# Patient Record
Sex: Female | Born: 1985 | Hispanic: No | Marital: Married | Smoking: Never smoker
Health system: Southern US, Community
[De-identification: ages and names within clinical notes are randomized; demographics above are authoritative.]

## PROBLEM LIST (undated history)

## (undated) DIAGNOSIS — Z923 Personal history of irradiation: Secondary | ICD-10-CM

## (undated) DIAGNOSIS — Z22330 Carrier of Group B streptococcus: Secondary | ICD-10-CM

## (undated) DIAGNOSIS — Z820 Family history of epilepsy and other diseases of the nervous system: Secondary | ICD-10-CM

## (undated) DIAGNOSIS — Z8619 Personal history of other infectious and parasitic diseases: Secondary | ICD-10-CM

## (undated) DIAGNOSIS — F32A Depression, unspecified: Secondary | ICD-10-CM

## (undated) DIAGNOSIS — R87619 Unspecified abnormal cytological findings in specimens from cervix uteri: Secondary | ICD-10-CM

## (undated) DIAGNOSIS — F419 Anxiety disorder, unspecified: Secondary | ICD-10-CM

## (undated) DIAGNOSIS — K219 Gastro-esophageal reflux disease without esophagitis: Secondary | ICD-10-CM

## (undated) DIAGNOSIS — C50919 Malignant neoplasm of unspecified site of unspecified female breast: Secondary | ICD-10-CM

## (undated) DIAGNOSIS — O210 Mild hyperemesis gravidarum: Secondary | ICD-10-CM

## (undated) DIAGNOSIS — Z9221 Personal history of antineoplastic chemotherapy: Secondary | ICD-10-CM

## (undated) DIAGNOSIS — Z87898 Personal history of other specified conditions: Secondary | ICD-10-CM

## (undated) DIAGNOSIS — IMO0002 Reserved for concepts with insufficient information to code with codable children: Secondary | ICD-10-CM

## (undated) DIAGNOSIS — D649 Anemia, unspecified: Secondary | ICD-10-CM

## (undated) DIAGNOSIS — R1013 Epigastric pain: Secondary | ICD-10-CM

## (undated) DIAGNOSIS — Z8742 Personal history of other diseases of the female genital tract: Secondary | ICD-10-CM

## (undated) DIAGNOSIS — R102 Pelvic and perineal pain: Secondary | ICD-10-CM

## (undated) DIAGNOSIS — R630 Anorexia: Secondary | ICD-10-CM

## (undated) HISTORY — DX: Personal history of other specified conditions: Z87.898

## (undated) HISTORY — DX: Gastro-esophageal reflux disease without esophagitis: K21.9

## (undated) HISTORY — PX: MASTECTOMY: SHX3

## (undated) HISTORY — DX: Family history of epilepsy and other diseases of the nervous system: Z82.0

## (undated) HISTORY — DX: Mild hyperemesis gravidarum: O21.0

## (undated) HISTORY — DX: Reserved for concepts with insufficient information to code with codable children: IMO0002

## (undated) HISTORY — DX: Depression, unspecified: F32.A

## (undated) HISTORY — DX: Anxiety disorder, unspecified: F41.9

## (undated) HISTORY — DX: Unspecified abnormal cytological findings in specimens from cervix uteri: R87.619

## (undated) HISTORY — DX: Carrier of group B Streptococcus: Z22.330

## (undated) HISTORY — DX: Pelvic and perineal pain: R10.2

## (undated) HISTORY — DX: Anemia, unspecified: D64.9

## (undated) HISTORY — PX: WISDOM TOOTH EXTRACTION: SHX21

## (undated) HISTORY — DX: Malignant neoplasm of unspecified site of unspecified female breast: C50.919

## (undated) HISTORY — DX: Epigastric pain: R10.13

## (undated) HISTORY — DX: Personal history of other infectious and parasitic diseases: Z86.19

## (undated) HISTORY — DX: Personal history of other diseases of the female genital tract: Z87.42

## (undated) HISTORY — DX: Anorexia: R63.0

---

## 2004-02-17 ENCOUNTER — Other Ambulatory Visit: Admission: RE | Admit: 2004-02-17 | Discharge: 2004-02-17 | Payer: Self-pay | Admitting: Obstetrics and Gynecology

## 2004-04-27 ENCOUNTER — Encounter: Admission: RE | Admit: 2004-04-27 | Discharge: 2004-04-27 | Payer: Self-pay | Admitting: Gastroenterology

## 2004-04-27 IMAGING — US US ABDOMEN COMPLETE
1 series · 14 of 25 positions shown · non-contrast
Comparison: none

CLINICAL DATA: Upper abdominal pain.  13 weeks pregnant.
 ULTRASOUND OF THE ABDOMEN
 Scans over the upper abdomen were performed.  The gallbladder is well seen and no gallstones are noted.  The liver has a normal echogenic pattern.  The common bile duct is normal measuring 3.5 mm in diameter.  Assessment of the IVC and pancreas is somewhat limited by bowel gas although most of the pancreas is moderately well seen and appears normal.  The spleen is normal in size.  No hydronephrosis is seen.  The right kidney measures 10.6 cm sagittally with the left kidney measuring 10.1 cm.  The abdominal aorta is normal in caliber.

[Series 1: unknown · 0.27mm/px · 14 of 69 slices shown]
[im 1/69]
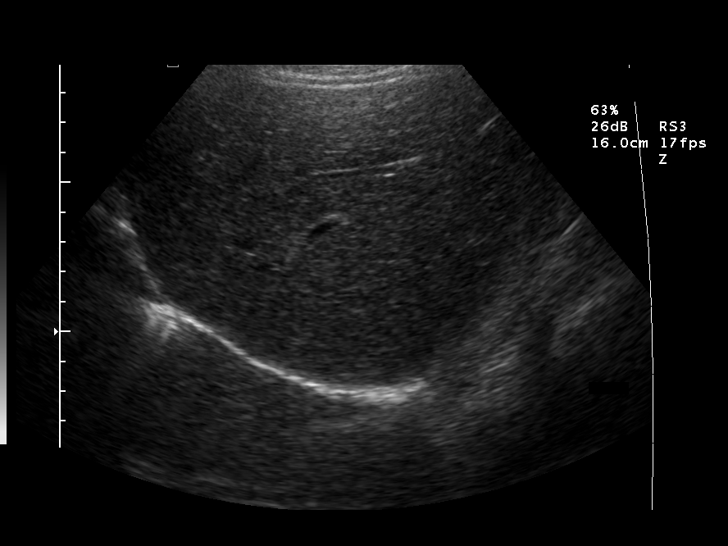
[im 6/69]
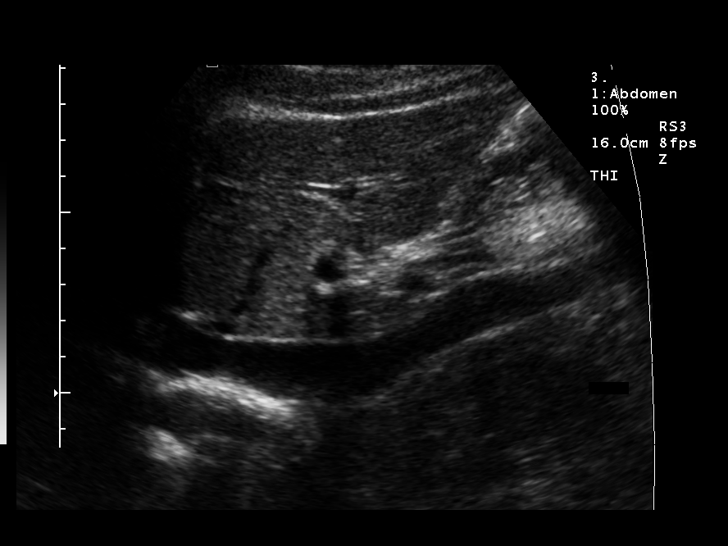
[im 12/69]
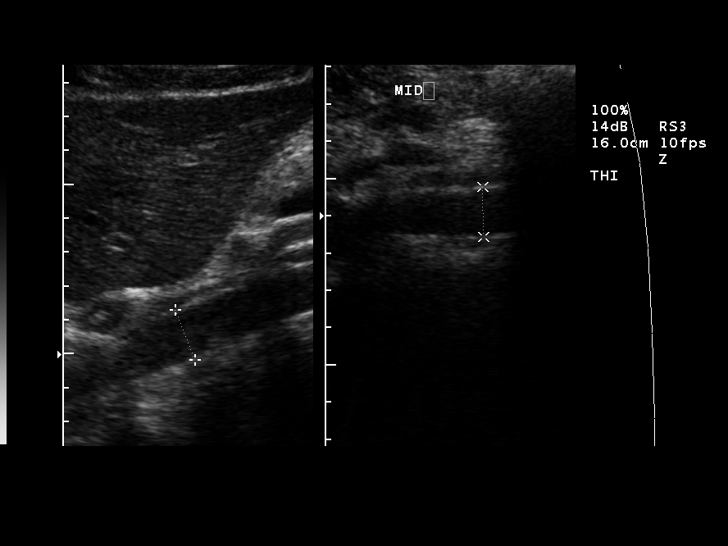
[im 18/69]
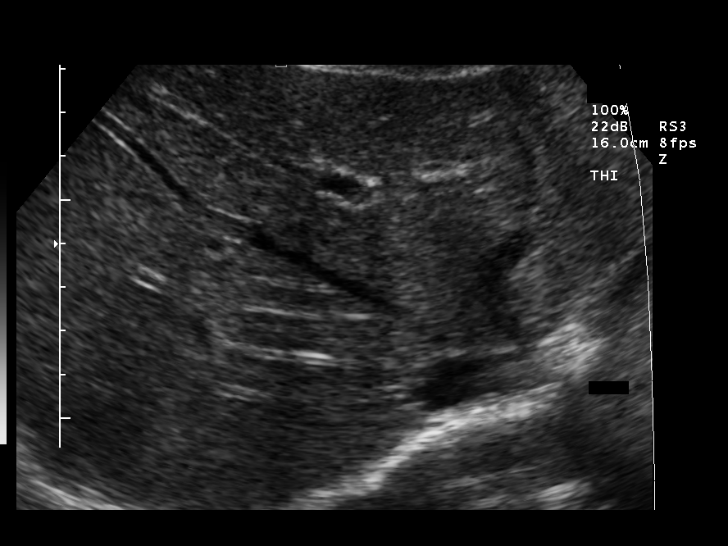
[im 23/69]
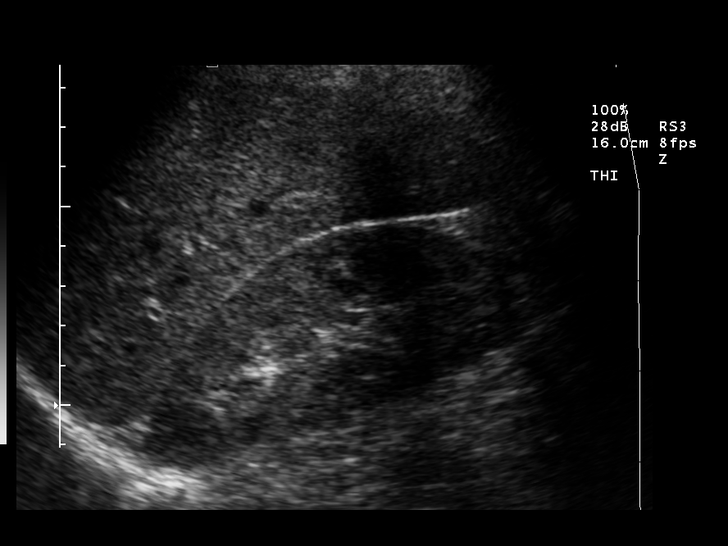
[im 26/69]
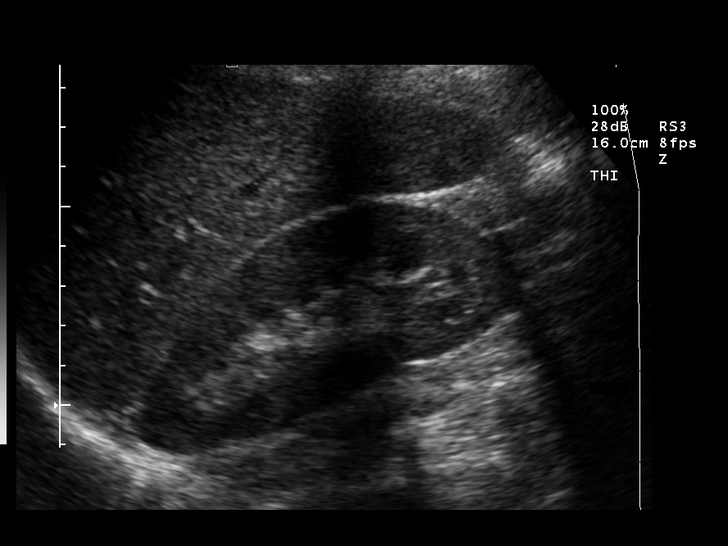
[im 32/69]
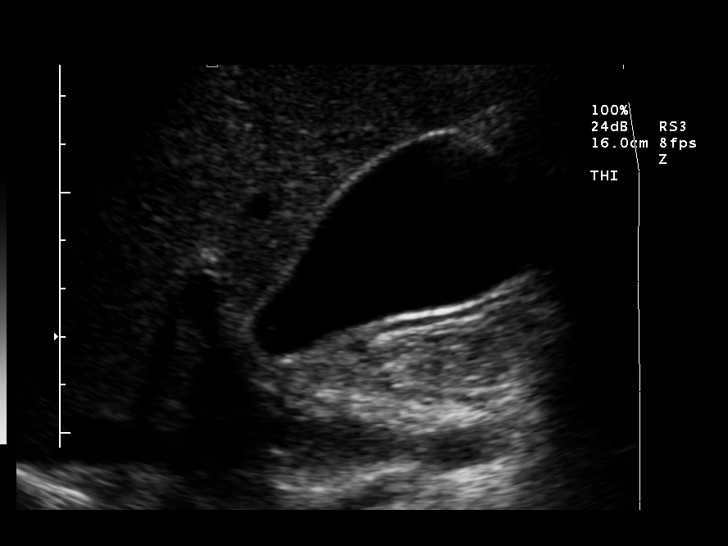
[im 37/69]
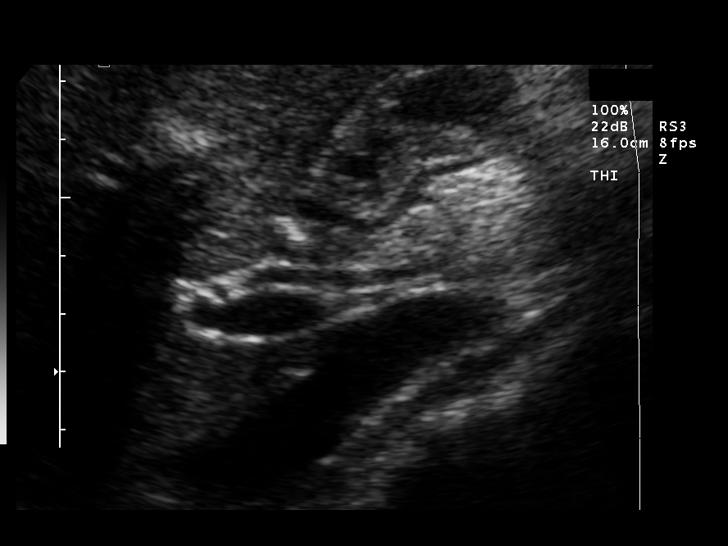
[im 43/69]
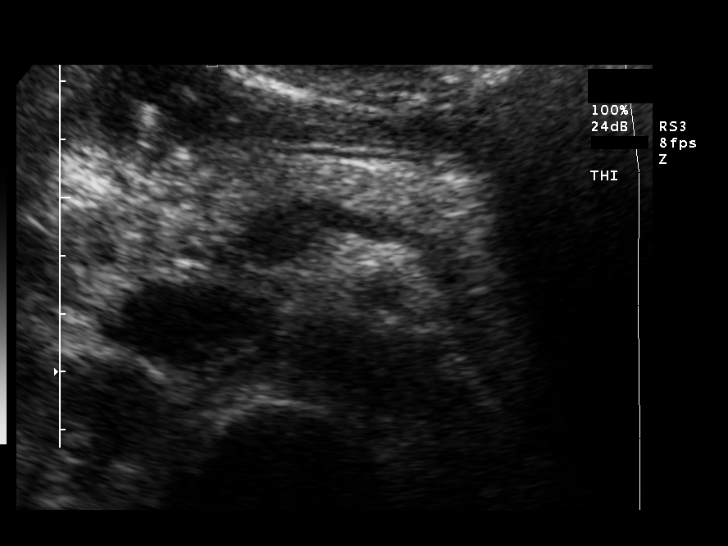
[im 46/69]
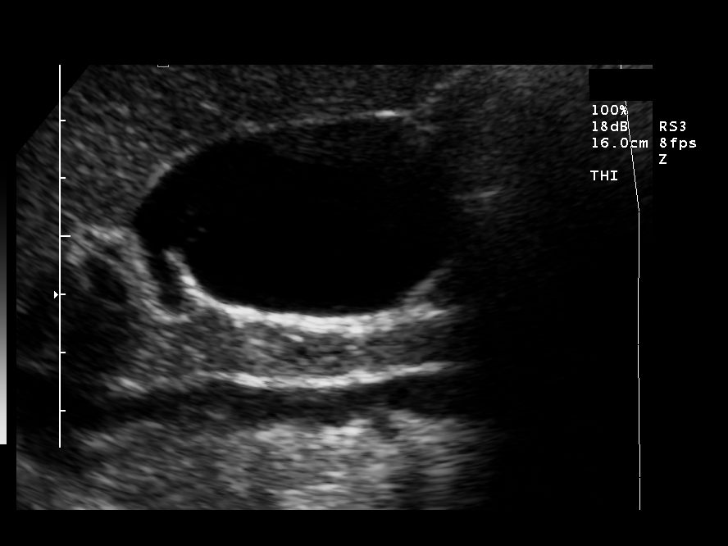
[im 52/69]
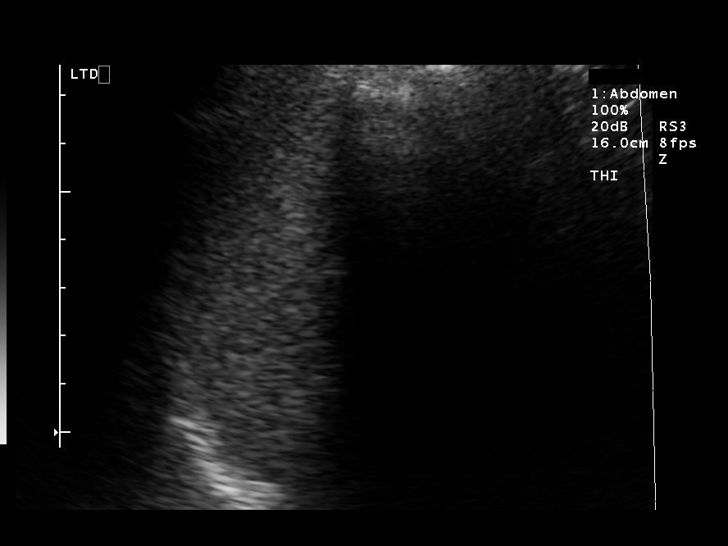
[im 57/69]
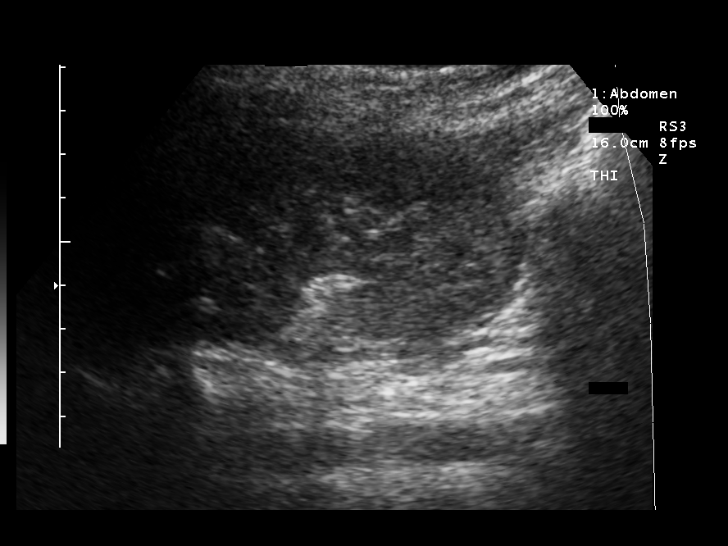
[im 63/69]
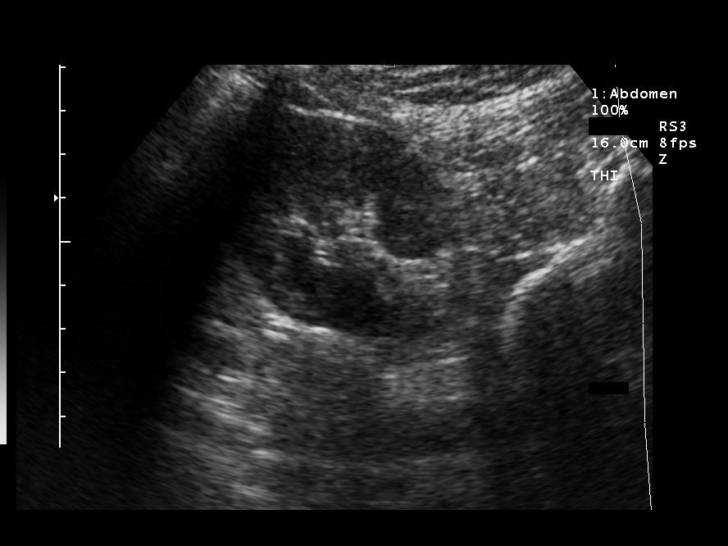
[im 69/69]
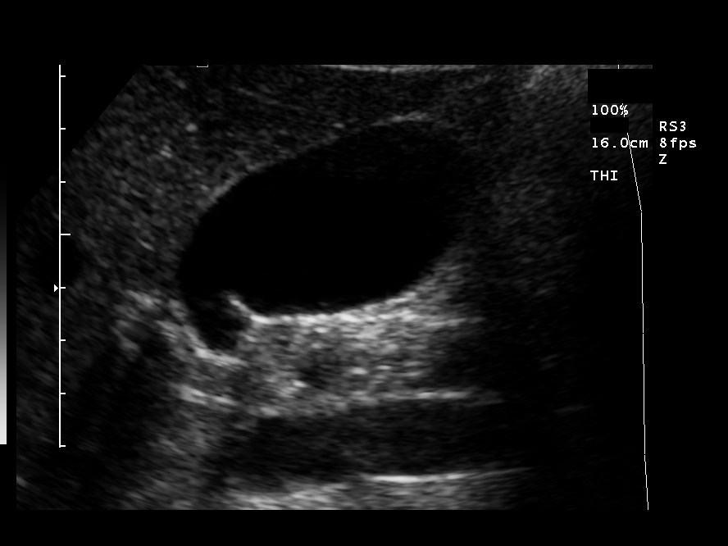

[14 of 25 positions shown; findings below may reference images not displayed]

IMPRESSION: 1.  No gallstones. 
 2.  Pancreas appears normal with only the tail not well seen due to bowel gas.

## 2004-08-26 ENCOUNTER — Other Ambulatory Visit: Admission: RE | Admit: 2004-08-26 | Discharge: 2004-08-26 | Payer: Self-pay | Admitting: Obstetrics and Gynecology

## 2004-10-22 ENCOUNTER — Inpatient Hospital Stay (HOSPITAL_COMMUNITY): Admission: AD | Admit: 2004-10-22 | Discharge: 2004-10-25 | Payer: Self-pay | Admitting: Obstetrics and Gynecology

## 2004-10-28 ENCOUNTER — Inpatient Hospital Stay (HOSPITAL_COMMUNITY): Admission: AD | Admit: 2004-10-28 | Discharge: 2004-10-28 | Payer: Self-pay

## 2005-07-07 ENCOUNTER — Other Ambulatory Visit: Admission: RE | Admit: 2005-07-07 | Discharge: 2005-07-07 | Payer: Self-pay | Admitting: Obstetrics and Gynecology

## 2005-07-16 DIAGNOSIS — IMO0002 Reserved for concepts with insufficient information to code with codable children: Secondary | ICD-10-CM

## 2005-07-16 HISTORY — DX: Reserved for concepts with insufficient information to code with codable children: IMO0002

## 2006-06-17 DIAGNOSIS — Z8742 Personal history of other diseases of the female genital tract: Secondary | ICD-10-CM

## 2006-06-17 HISTORY — DX: Personal history of other diseases of the female genital tract: Z87.42

## 2006-10-17 DIAGNOSIS — Z87898 Personal history of other specified conditions: Secondary | ICD-10-CM

## 2006-10-17 DIAGNOSIS — R630 Anorexia: Secondary | ICD-10-CM

## 2006-10-17 DIAGNOSIS — R1013 Epigastric pain: Secondary | ICD-10-CM

## 2006-10-17 HISTORY — DX: Personal history of other specified conditions: Z87.898

## 2006-10-17 HISTORY — DX: Anorexia: R63.0

## 2006-10-17 HISTORY — DX: Epigastric pain: R10.13

## 2006-10-26 ENCOUNTER — Inpatient Hospital Stay (HOSPITAL_COMMUNITY): Admission: AD | Admit: 2006-10-26 | Discharge: 2006-10-26 | Payer: Self-pay | Admitting: Obstetrics and Gynecology

## 2007-06-12 ENCOUNTER — Inpatient Hospital Stay (HOSPITAL_COMMUNITY): Admission: AD | Admit: 2007-06-12 | Discharge: 2007-06-14 | Payer: Self-pay | Admitting: Obstetrics and Gynecology

## 2008-09-16 DIAGNOSIS — R102 Pelvic and perineal pain: Secondary | ICD-10-CM

## 2008-09-16 HISTORY — DX: Pelvic and perineal pain: R10.2

## 2010-05-31 NOTE — H&P (Signed)
Caitlyn Miller, Caitlyn Miller               ACCOUNT NO.:  [id number]   MEDICAL RECORD NO.:  [id number]          PATIENT TYPE:  INP   LOCATION:  9170                          FACILITY:  [facility]   PHYSICIAN:  Crist Fat. Rivard, M.D. DATE OF BIRTH:  03/07/1985   DATE OF ADMISSION:  06/12/2007  DATE OF DISCHARGE:                              HISTORY & PHYSICAL   HISTORY OF PRESENT ILLNESS:  This 26 year old gravida 2, para 1-0-0-1 at  40-2/7 weeks who presents for induction of labor secondary to  oligohydramnios and BPP score of 4/8.  There is no bleeding, and there  are no contractions.  Pregnancy has been followed by Dr. Lamar Benes and  remarkable for;  1. Language barrier.  2. GERD  3. History of anemia.  4. History of abnormal Pap.  5. Group B strep negative.   ALLERGIES:  None.   OB HISTORY:  Remarkable for vaginal delivery in 2006 of a female infant at  2 weeks' gestation, weighing 7-1/2 pounds with no complications.   MEDICAL HISTORY:  Remarkable for anemia with first pregnancy,  hyperemesis with first pregnancy, childhood varicella, and questionable  history of hypertension.   SURGICAL HISTORY:  Negative.   FAMILY HISTORY:  Remarkable for mother with migraines.   GENETIC HISTORY:  Remarkable for consanguinity on the patient's father  side and father of the baby's sister with twins.   SOCIAL HISTORY:  The patient is married to [employer] who is involved  and supportive.  She is of Muslim faith.  She has a slight language  barrier, but appears to understand most communication.  She denies any  alcohol, tobacco, or drug use.   PRENATAL LABS:  Hemoglobin 12.1, platelets 276.  Blood type A+, antibody  screen negative,  RPR nonreactive, rubella immune.  Hepatitis negative,  HIV negative.  Pap test normal.  Gonorrhea negative, chlamydia negative.  History of current pregnancy.  The patient entered care at 12 weeks'  gestation.  She was given Phenergan and Zofran for nausea and vomiting.  She had a 6-week ultrasound that was normal.  She had first trimester  screen that was normal and a 20-week ultrasound that was also normal.  Glucola at 28 weeks was normal at 93, and Group B strep was negative at  term.   OBJECTIVE:  VITAL SIGNS:  Stable, afebrile.  HEENT:  Within normal limits.  Thyroid not enlarged.  CHEST:  Clear to auscultation.  HEART:  Regular rate and rhythm.  ABDOMEN:  Gravid.  Vertex Leopold's exam shows reactive fetal heart rate  with no contractions.  PELVIC:  EFW is 7 to 7-1/2 pounds.  Cervix was fingertip and 50% in the  office today.  EXTREMITIES:  Within normal limits.   ASSESSMENT:  1. Intrauterine pregnancy at 40-2/7 weeks.  2. Oligohydramnios with BPP score 4/8.   PLAN:  1. Admit per Dr. Estanislado Pandy.  2. Routine MD orders.  3. Cervidil tonight and Pitocin in a.m., and further orders to follow.      Marie L. Williams, C.N.M.      Crist Fat Rivard, M.D.  Electronically Signed  MLW/MEDQ  D:  06/12/2007  T:  06/13/2007  Job:  [id number]

## 2010-06-03 NOTE — H&P (Signed)
NAMEDEANN, MCLAINE NO.:  [id number]   MEDICAL RECORD NO.:  [id number]          PATIENT TYPE:  MAT   LOCATION:  MATC                          FACILITY:  [facility]   PHYSICIAN:  Hal Morales, M.D.DATE OF BIRTH:  September 06, 1985   DATE OF ADMISSION:  10/22/2004  DATE OF DISCHARGE:                                HISTORY & PHYSICAL   Ms. Brotzman is a 25 year old married female, primigravida at 40-4/7th weeks,  who presents with some uterine contractions as well as leaking fluid since 4  a.m. This morning, she denies bleeding or signs or symptoms of PIH. Her  pregnancy has been followed by the [facility] service and has  been remarkable for 1) language barrier; 2) Pap with LGSIL; 3) GERD with H.  pylori. The patient has been on Nexium; 4) group B strep negative.   Her prenatal labs were collected on March 25, 2004; hemoglobin 11.4,  hematocrit 33.2, platelets 299,000. Blood type A positive, antibody  negative. Hemoglobin electrophoresis negative. RPR nonreactive. Rubella  immuned. Hepatitis B surface-antigen negative. Pap from February of 2006  showed LGSIL. Gonorrhea negative, Chlamydia negative, cystic fibrosis  negative. Quadruple screen from the second trimester was within normal  limits. One-hour Glucola from July 20, 2004 was 96 with RPR nonreactive at  that time. Pap smear from August 26, 2004 showed LGSIL.   HISTORY OF PRESENT PREGNANCY:  The patient presented for care at [facility] on March 25, 2004 at 10-3/7th weeks gestation. She was  having abdominal pain and was referred to gastroenterologist. At that time,  she had a colposcopy at that time due to LGSIL. She was started on Nexium at  the end of the first trimester due to GERD. Her ultrasonography at [redacted] weeks  gestation showed growth consistent with previous dating with all anatomy  seen, normal fluids, cervix at 4.5 cm, posterior placenta. She continued  Nexium 40 mg throughout her  pregnancy. She had a repeat Pap smear at [redacted]  weeks gestation that also showed LGSIL. Plan was for repeat Pap smear in six  months. Ultrasonography was done at 34 weeks for size less than dates.  Growth was 57th to 58th percentile with normal fluid. The rest of her  prenatal care has been unremarkable.   OBSTETRICAL HISTORY:  She is a primigravida. Her medical history shows no  medication allergies. She reports having had the usual childhood illnesses.  She experienced menarche at the age with 32 with regular cycles lasting six  days. She reports having high blood pressure at times.   FAMILY HISTORY:  Remarkable for mother with migraines.   PAST SURGICAL HISTORY:  Negative.   GENETIC HISTORY:  Remarkable in that the patient's father's family are all  relatives. Father of the baby's sister has twins. Paternal uncle has twins.   SOCIAL HISTORY:  The patient is married to the father of the baby. His name  is Rachid. The patient and the father of the baby are both high school  educated. He is employed full-time as a P[employer] consultant. They deny any alcohol,  tobacco, or  illicit drug use with this pregnancy.   PHYSICAL EXAMINATION:  VITAL SIGNS:  Stable. She is afebrile.  HEENT:  Grossly within normal limits.  CHEST:  Clear to auscultation.  HEART:  Regular rate and rhythm.  ABDOMEN:  Gravid and contoured with a fundal height extending approximately  39 cm by pubic symphysis. Fetal heart rate is reactive and reassuring.  Contractions are irregular and mild. Serial speculum exam shows positive  pooling, positive Nitrazine, positive fern.  CERVIX:  Posterior 1 cm, 50%, vertex -2, clear fluid is present.  EXTREMITIES:  Normal.   ASSESSMENT:  1.  Intrauterine pregnancy at term.  2.  Spontaneous rupture of membranes x13 hours.  3.  Group B strep is negative.   PLAN:  1.  Admit to birthing suite. Dr. Pennie Rushing has been notified.  2.  Routine M.D. orders.  3.  The patient declined Pitocin at the  present time despite risk of      infection. That was reviewed with the patient. She planned expectant      management for now.      Cam Hai, C.N.M.      Hal Morales, M.D.  Electronically Signed    KS/MEDQ  D:  10/22/2004  T:  10/22/2004  Job:  [id number]

## 2010-10-12 LAB — CBC
HCT: 29.8 — ABNORMAL LOW
Hemoglobin: 10.1 — ABNORMAL LOW
Hemoglobin: 9.9 — ABNORMAL LOW
MCHC: 33.8
MCV: 87.7
Platelets: 266
RBC: 3.37 — ABNORMAL LOW
RBC: 3.4 — ABNORMAL LOW
RDW: 15.1
RDW: 15.4
WBC: 11 — ABNORMAL HIGH

## 2010-10-27 LAB — URINALYSIS, ROUTINE W REFLEX MICROSCOPIC
Glucose, UA: NEGATIVE
Protein, ur: NEGATIVE
Urobilinogen, UA: 0.2

## 2010-12-25 ENCOUNTER — Ambulatory Visit (INDEPENDENT_AMBULATORY_CARE_PROVIDER_SITE_OTHER): Payer: PRIVATE HEALTH INSURANCE

## 2010-12-25 DIAGNOSIS — B009 Herpesviral infection, unspecified: Secondary | ICD-10-CM

## 2010-12-25 DIAGNOSIS — J111 Influenza due to unidentified influenza virus with other respiratory manifestations: Secondary | ICD-10-CM

## 2011-06-09 ENCOUNTER — Encounter: Payer: Self-pay | Admitting: Obstetrics and Gynecology

## 2011-06-20 DIAGNOSIS — Z8669 Personal history of other diseases of the nervous system and sense organs: Secondary | ICD-10-CM

## 2011-06-20 DIAGNOSIS — D649 Anemia, unspecified: Secondary | ICD-10-CM | POA: Insufficient documentation

## 2011-06-20 DIAGNOSIS — K219 Gastro-esophageal reflux disease without esophagitis: Secondary | ICD-10-CM | POA: Insufficient documentation

## 2011-06-22 ENCOUNTER — Ambulatory Visit (INDEPENDENT_AMBULATORY_CARE_PROVIDER_SITE_OTHER): Payer: PRIVATE HEALTH INSURANCE | Admitting: Obstetrics and Gynecology

## 2011-06-22 ENCOUNTER — Encounter: Payer: Self-pay | Admitting: Obstetrics and Gynecology

## 2011-06-22 VITALS — BP 108/70 | Resp 14 | Wt 123.0 lb

## 2011-06-22 DIAGNOSIS — Z30432 Encounter for removal of intrauterine contraceptive device: Secondary | ICD-10-CM

## 2011-06-22 DIAGNOSIS — Z309 Encounter for contraceptive management, unspecified: Secondary | ICD-10-CM

## 2011-06-22 MED ORDER — DROSPIRENONE-ETHINYL ESTRADIOL 3-0.03 MG PO TABS: 1.0000 | ORAL_TABLET | Freq: Every day | ORAL | Status: DC

## 2011-06-22 NOTE — Progress Notes (Signed)
26 YO with Mirena IUD x 1 year wants it removed due to amenorrhea, acne and breast tenderness.   O: Pelvic: EGBUS-wnl; cervix-no lesions, IUD removed without difficulty, uterus- normal size, adnexae- no tenderness    A:  IUD Removal due to hormonal side effects     Contraceptive Management   P: Reviewed contraceptive options, has been on Nuva Ring      but did not like it; has used Yaz before but kept forgetting      pills but wants to try BCPs again. Spent 20 minutes in      discussion of options, pill MOA, dosing, side effects and      VTE risks       Yasmin #1 1 po qd 11 refills, start today, use back up      x 1st cycle      BCP instruction sheet given      Patient is considering Paragard     RTO-as scheduled

## 2013-10-02 ENCOUNTER — Other Ambulatory Visit: Payer: Self-pay | Admitting: Obstetrics and Gynecology

## 2013-10-02 DIAGNOSIS — N644 Mastodynia: Secondary | ICD-10-CM

## 2013-10-02 DIAGNOSIS — N63 Unspecified lump in unspecified breast: Secondary | ICD-10-CM

## 2013-10-06 ENCOUNTER — Ambulatory Visit
Admission: RE | Admit: 2013-10-06 | Discharge: 2013-10-06 | Disposition: A | Discharge status: Home / Self Care | Payer: Commercial Indemnity | Source: Ambulatory Visit | Attending: Obstetrics and Gynecology | Admitting: Obstetrics and Gynecology

## 2013-10-06 ENCOUNTER — Encounter (INDEPENDENT_AMBULATORY_CARE_PROVIDER_SITE_OTHER): Payer: Self-pay

## 2013-10-06 DIAGNOSIS — N644 Mastodynia: Secondary | ICD-10-CM

## 2013-10-06 DIAGNOSIS — N63 Unspecified lump in unspecified breast: Secondary | ICD-10-CM

## 2013-10-06 IMAGING — MG MM DIAG BREAST TOMO BILATERAL
6 of 9 series · 6 of 25 positions shown · non-contrast
Comparison: None.

CLINICAL DATA: 28-year-old female with a left breast palpable
abnormality.

EXAM:
DIGITAL DIAGNOSTIC BILATERAL MAMMOGRAM WITH 3D TOMOSYNTHESIS WITH
CAD
ULTRASOUND LEFT BREAST

[L TAN]
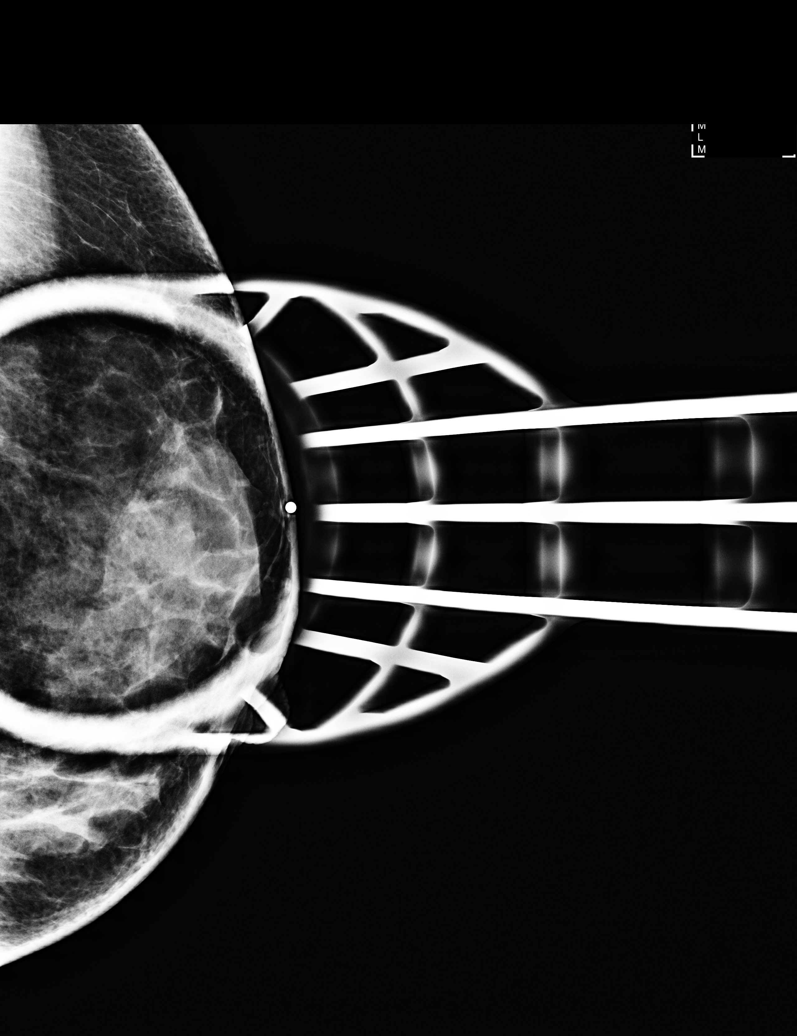

[R MLO]
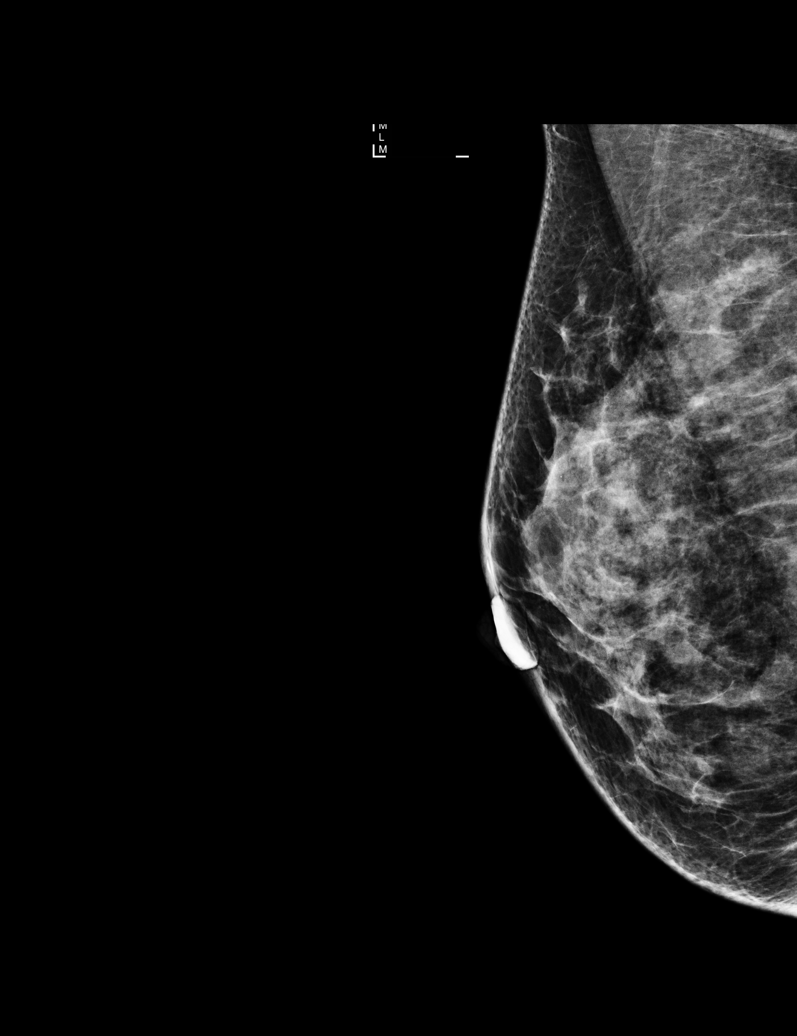

[L MLO]
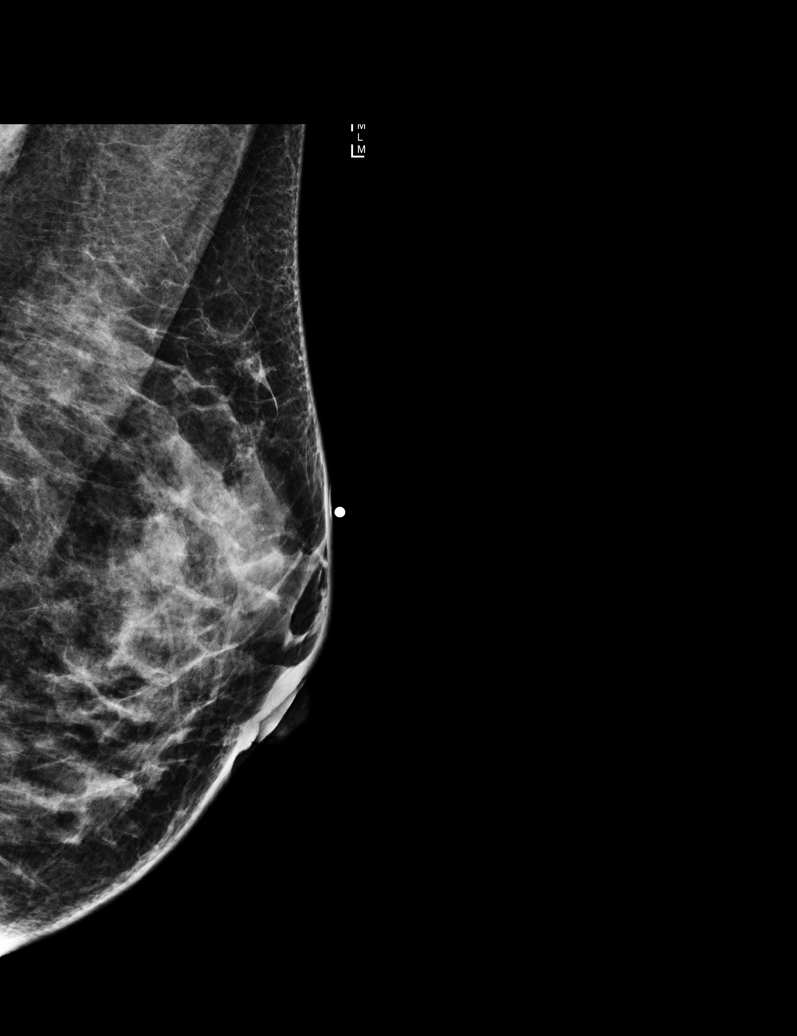

[L CC]
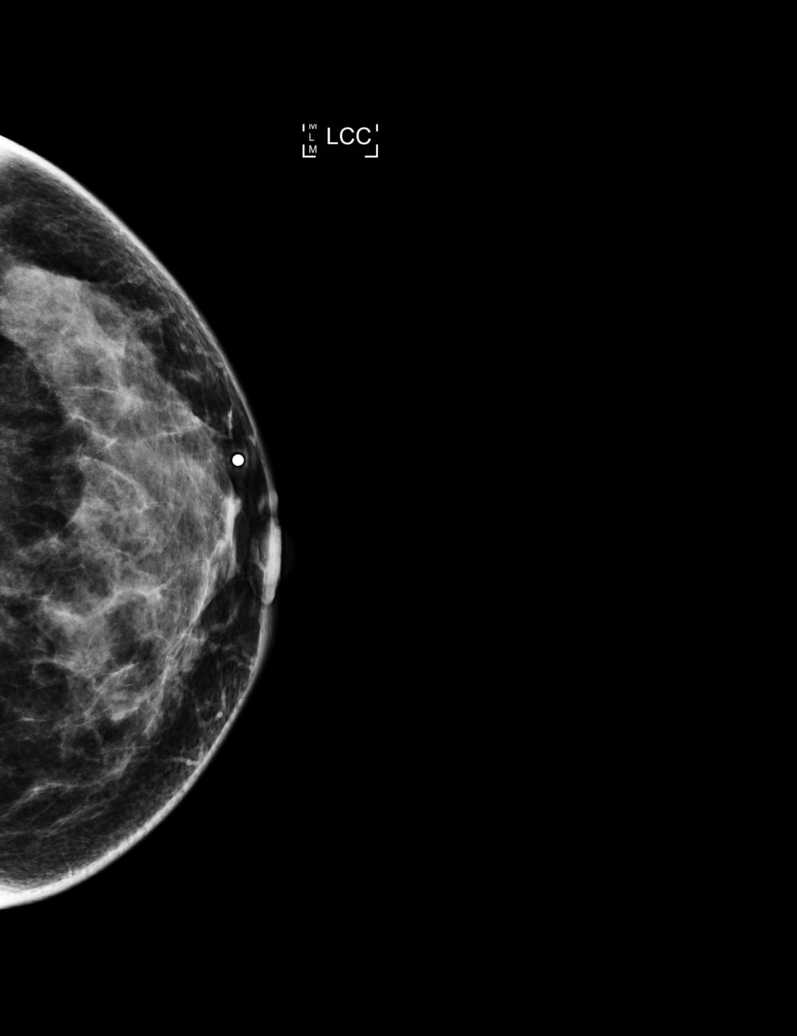

[R CC]
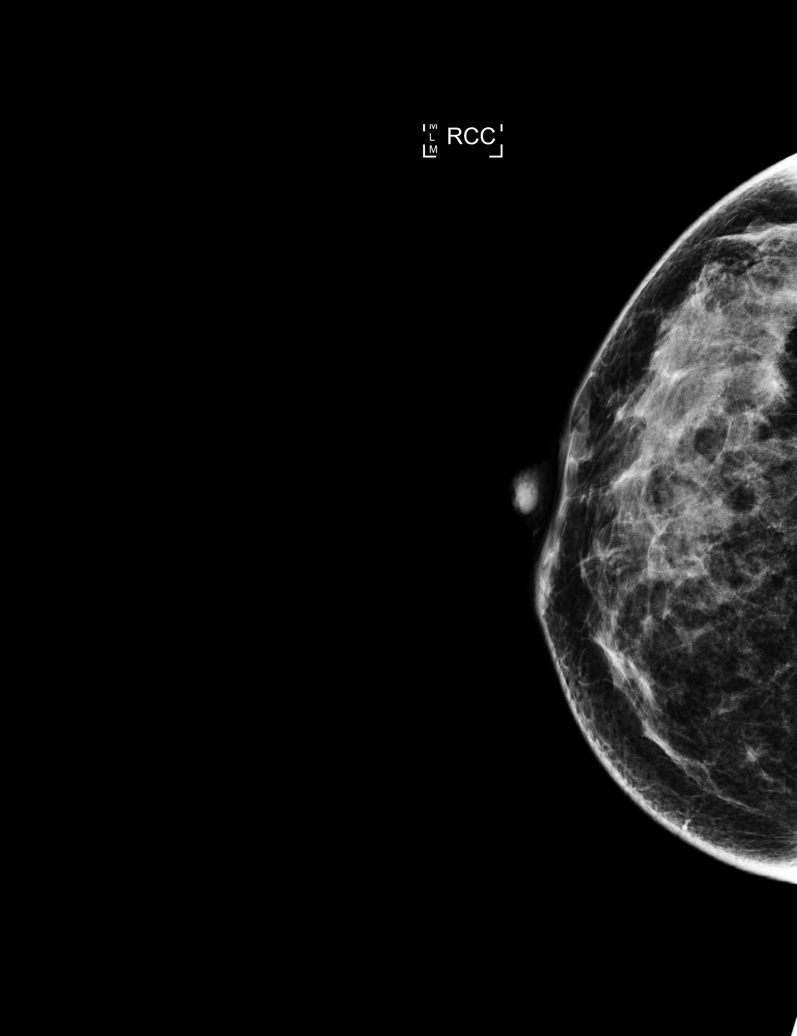

[R CC tomo · tomo slice 23/45.0]
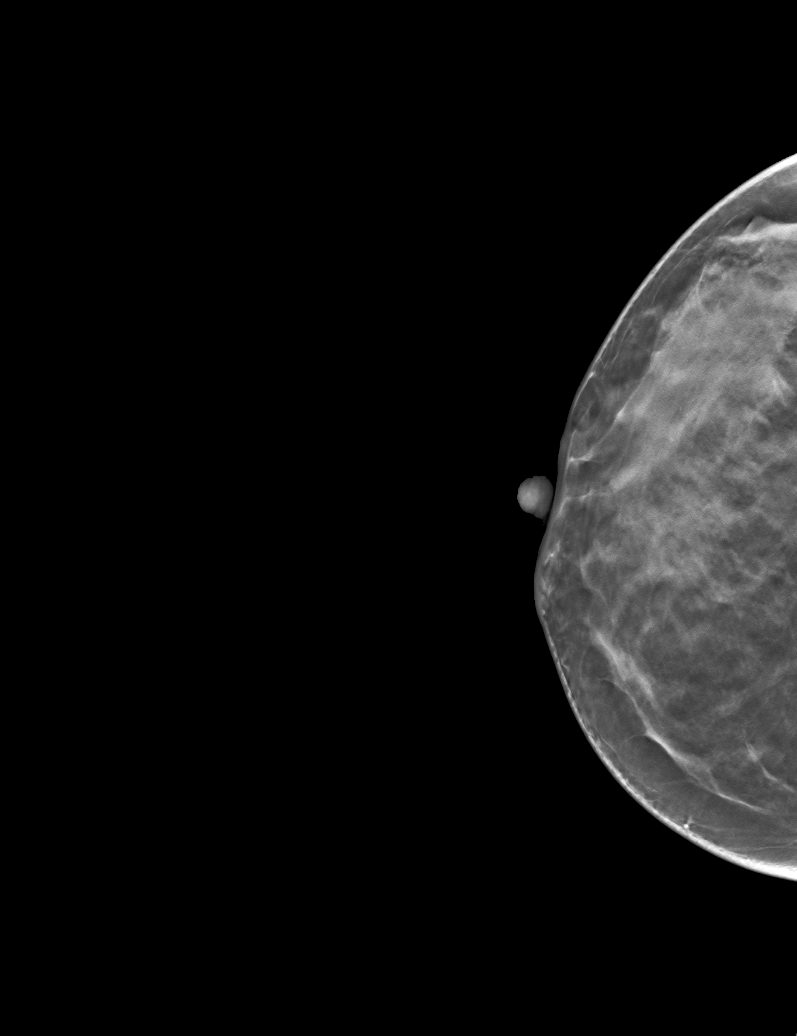

[6 of 25 positions shown; findings below may reference images not displayed]

ACR Breast Density Category c: The breast tissue is heterogeneously
dense, which may obscure small masses.
FINDINGS: No suspicious masses or calcifications are seen in either breast.
Scattered punctate calcifications are seen in the right breast. A
spot compression tangential view over the palpable site of concern
in the left breast was performed with no definite mammographic
abnormalities seen in this location.

Mammographic images were processed with CAD.

Physical examination at site of palpable concern in the slightly
upper slightly outer left breast does not reveal any palpable
masses.

Targeted ultrasound of the left breast was performed. No discrete
masses or abnormalities are seen, only heterogeneous fibroglandular
tissue is visualized.
IMPRESSION: 1. No mammographic or sonographic correlate for the palpable
abnormality in the left breast. This palpable abnormality may be due
to very dense fibroglandular tissue in this location.

2.  No mammographic evidence of malignancy in either breast.

RECOMMENDATION:
1. Recommend further evaluation of the left breast palpable
abnormality be based on clinical grounds.

2. Screening mammogram at age 40 unless there are persistent or
intervening clinical concerns. (Code:[38])

I have discussed the findings and recommendations with the patient.
Results were also provided in writing at the conclusion of the
visit. If applicable, a reminder letter will be sent to the patient
regarding the next appointment.

BI-RADS CATEGORY  1: Negative.

## 2013-11-17 ENCOUNTER — Encounter: Payer: Self-pay | Admitting: Obstetrics and Gynecology

## 2015-02-26 ENCOUNTER — Emergency Department (HOSPITAL_COMMUNITY)
Admission: EM | Admit: 2015-02-26 | Discharge: 2015-02-26 | Disposition: A | Discharge status: Home / Self Care | Payer: PRIVATE HEALTH INSURANCE | Attending: Emergency Medicine | Admitting: Emergency Medicine

## 2015-02-26 ENCOUNTER — Encounter (HOSPITAL_COMMUNITY): Payer: Self-pay | Admitting: Emergency Medicine

## 2015-02-26 DIAGNOSIS — R110 Nausea: Secondary | ICD-10-CM | POA: Diagnosis not present

## 2015-02-26 DIAGNOSIS — Y998 Other external cause status: Secondary | ICD-10-CM | POA: Insufficient documentation

## 2015-02-26 DIAGNOSIS — S161XXA Strain of muscle, fascia and tendon at neck level, initial encounter: Secondary | ICD-10-CM | POA: Diagnosis not present

## 2015-02-26 DIAGNOSIS — Y92410 Unspecified street and highway as the place of occurrence of the external cause: Secondary | ICD-10-CM | POA: Insufficient documentation

## 2015-02-26 DIAGNOSIS — Y9389 Activity, other specified: Secondary | ICD-10-CM | POA: Insufficient documentation

## 2015-02-26 DIAGNOSIS — Z8719 Personal history of other diseases of the digestive system: Secondary | ICD-10-CM | POA: Diagnosis not present

## 2015-02-26 DIAGNOSIS — R42 Dizziness and giddiness: Secondary | ICD-10-CM | POA: Diagnosis not present

## 2015-02-26 DIAGNOSIS — Z862 Personal history of diseases of the blood and blood-forming organs and certain disorders involving the immune mechanism: Secondary | ICD-10-CM | POA: Insufficient documentation

## 2015-02-26 DIAGNOSIS — Z79899 Other long term (current) drug therapy: Secondary | ICD-10-CM | POA: Insufficient documentation

## 2015-02-26 DIAGNOSIS — S199XXA Unspecified injury of neck, initial encounter: Secondary | ICD-10-CM | POA: Diagnosis present

## 2015-02-26 MED ORDER — ONDANSETRON 4 MG PO TBDP: 4.0000 mg | ORAL_TABLET | Freq: Three times a day (TID) | ORAL | Status: DC | PRN

## 2015-02-26 MED ORDER — HYDROCODONE-ACETAMINOPHEN 5-325 MG PO TABS: 1.0000 | ORAL_TABLET | Freq: Four times a day (QID) | ORAL | Status: DC | PRN
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
## 2015-02-26 NOTE — Discharge Instructions (Signed)
°Cervical Strain and Sprain With Rehab °Cervical strain and sprain are injuries that commonly occur with "whiplash" injuries. Whiplash occurs when the neck is forcefully whipped backward or forward, such as during a motor vehicle accident or during contact sports. The muscles, ligaments, tendons, discs, and nerves of the neck are susceptible to injury when this occurs. °RISK FACTORS °Risk of having a whiplash injury increases if: °· Osteoarthritis of the spine. °· Situations that make head or neck accidents or trauma more likely. °· High-risk sports (football, rugby, wrestling, hockey, auto racing, gymnastics, diving, contact karate, or boxing). °· Poor strength and flexibility of the neck. °· Previous neck injury. °· Poor tackling technique. °· Improperly fitted or padded equipment. °SYMPTOMS  °· Pain or stiffness in the front or back of neck or both. °· Symptoms may present immediately or up to 24 hours after injury. °· Dizziness, headache, nausea, and vomiting. °· Muscle spasm with soreness and stiffness in the neck. °· Tenderness and swelling at the injury site. °PREVENTION °· Learn and use proper technique (avoid tackling with the head, spearing, and head-butting; use proper falling techniques to avoid landing on the head). °· Warm up and stretch properly before activity. °· Maintain physical fitness: °¨ Strength, flexibility, and endurance. °¨ Cardiovascular fitness. °· Wear properly fitted and padded protective equipment, such as padded soft collars, for participation in contact sports. °PROGNOSIS  °Recovery from cervical strain and sprain injuries is dependent on the extent of the injury. These injuries are usually curable in 1 week to 3 months with appropriate treatment.  °RELATED COMPLICATIONS  °· Temporary numbness and weakness may occur if the nerve roots are damaged, and this may persist until the nerve has completely healed. °· Chronic pain due to frequent recurrence of symptoms. °· Prolonged healing,  especially if activity is resumed too soon (before complete recovery). °TREATMENT  °Treatment initially involves the use of ice and medication to help reduce pain and inflammation. It is also important to perform strengthening and stretching exercises and modify activities that worsen symptoms so the injury does not get worse. These exercises may be performed at home or with a therapist. For patients who experience severe symptoms, a soft, padded collar may be recommended to be worn around the neck.  °Improving your posture may help reduce symptoms. Posture improvement includes pulling your chin and abdomen in while sitting or standing. If you are sitting, sit in a firm chair with your buttocks against the back of the chair. While sleeping, try replacing your pillow with a small towel rolled to 2 inches in diameter, or use a cervical pillow or soft cervical collar. Poor sleeping positions delay healing.  °For patients with nerve root damage, which causes numbness or weakness, the use of a cervical traction apparatus may be recommended. Surgery is rarely necessary for these injuries. However, cervical strain and sprains that are present at birth (congenital) may require surgery. °MEDICATION  °· If pain medication is necessary, nonsteroidal anti-inflammatory medications, such as aspirin and ibuprofen, or other minor pain relievers, such as acetaminophen, are often recommended. °· Do not take pain medication for 7 days before surgery. °· Prescription pain relievers may be given if deemed necessary by your caregiver. Use only as directed and only as much as you need. °HEAT AND COLD:  °· Cold treatment (icing) relieves pain and reduces inflammation. Cold treatment should be applied for 10 to 15 minutes every 2 to 3 hours for inflammation and pain and immediately after any activity that aggravates your   HEAT AND COLD:   · Cold treatment (icing) relieves pain and reduces inflammation. Cold treatment should be applied for 10 to 15 minutes every 2 to 3 hours for inflammation and pain and immediately after any activity that aggravates your symptoms. Use ice packs or an ice massage.  · Heat treatment may be used prior to performing the stretching and  strengthening activities prescribed by your caregiver, physical therapist, or athletic trainer. Use a heat pack or a warm soak.  SEEK MEDICAL CARE IF:   · Symptoms get worse or do not improve in 2 weeks despite treatment.  · New, unexplained symptoms develop (drugs used in treatment may produce side effects).  EXERCISES  RANGE OF MOTION (ROM) AND STRETCHING EXERCISES - Cervical Strain and Sprain  These exercises may help you when beginning to rehabilitate your injury. In order to successfully resolve your symptoms, you must improve your posture. These exercises are designed to help reduce the forward-head and rounded-shoulder posture which contributes to this condition. Your symptoms may resolve with or without further involvement from your physician, physical therapist or athletic trainer. While completing these exercises, remember:   · Restoring tissue flexibility helps normal motion to return to the joints. This allows healthier, less painful movement and activity.  · An effective stretch should be held for at least 20 seconds, although you may need to begin with shorter hold times for comfort.  · A stretch should never be painful. You should only feel a gentle lengthening or release in the stretched tissue.  STRETCH- Axial Extensors  · Lie on your back on the floor. You may bend your knees for comfort. Place a rolled-up hand towel or dish towel, about 2 inches in diameter, under the part of your head that makes contact with the floor.  · Gently tuck your chin, as if trying to make a "double chin," until you feel a gentle stretch at the base of your head.  · Hold __________ seconds.  Repeat __________ times. Complete this exercise __________ times per day.   STRETCH - Axial Extension   · Stand or sit on a firm surface. Assume a good posture: chest up, shoulders drawn back, abdominal muscles slightly tense, knees unlocked (if standing) and feet hip width apart.  · Slowly retract your chin so your head slides back  and your chin slightly lowers. Continue to look straight ahead.  · You should feel a gentle stretch in the back of your head. Be certain not to feel an aggressive stretch since this can cause headaches later.  · Hold for __________ seconds.  Repeat __________ times. Complete this exercise __________ times per day.  STRETCH - Cervical Side Bend   · Stand or sit on a firm surface. Assume a good posture: chest up, shoulders drawn back, abdominal muscles slightly tense, knees unlocked (if standing) and feet hip width apart.  · Without letting your nose or shoulders move, slowly tip your right / left ear to your shoulder until your feel a gentle stretch in the muscles on the opposite side of your neck.  · Hold __________ seconds.  Repeat __________ times. Complete this exercise __________ times per day.  STRETCH - Cervical Rotators   · Stand or sit on a firm surface. Assume a good posture: chest up, shoulders drawn back, abdominal muscles slightly tense, knees unlocked (if standing) and feet hip width apart.  · Keeping your eyes level with the ground, slowly turn your head until you feel a gentle stretch along   the back and opposite side of your neck.  · Hold __________ seconds.  Repeat __________ times. Complete this exercise __________ times per day.  RANGE OF MOTION - Neck Circles   · Stand or sit on a firm surface. Assume a good posture: chest up, shoulders drawn back, abdominal muscles slightly tense, knees unlocked (if standing) and feet hip width apart.  · Gently roll your head down and around from the back of one shoulder to the back of the other. The motion should never be forced or painful.  · Repeat the motion 10-20 times, or until you feel the neck muscles relax and loosen.  Repeat __________ times. Complete the exercise __________ times per day.  STRENGTHENING EXERCISES - Cervical Strain and Sprain  These exercises may help you when beginning to rehabilitate your injury. They may resolve your symptoms with or  without further involvement from your physician, physical therapist, or athletic trainer. While completing these exercises, remember:   · Muscles can gain both the endurance and the strength needed for everyday activities through controlled exercises.  · Complete these exercises as instructed by your physician, physical therapist, or athletic trainer. Progress the resistance and repetitions only as guided.  · You may experience muscle soreness or fatigue, but the pain or discomfort you are trying to eliminate should never worsen during these exercises. If this pain does worsen, stop and make certain you are following the directions exactly. If the pain is still present after adjustments, discontinue the exercise until you can discuss the trouble with your clinician.  STRENGTH - Cervical Flexors, Isometric  · Face a wall, standing about 6 inches away. Place a small pillow, a ball about 6-8 inches in diameter, or a folded towel between your forehead and the wall.  · Slightly tuck your chin and gently push your forehead into the soft object. Push only with mild to moderate intensity, building up tension gradually. Keep your jaw and forehead relaxed.  · Hold 10 to 20 seconds. Keep your breathing relaxed.  · Release the tension slowly. Relax your neck muscles completely before you start the next repetition.  Repeat __________ times. Complete this exercise __________ times per day.  STRENGTH- Cervical Lateral Flexors, Isometric   · Stand about 6 inches away from a wall. Place a small pillow, a ball about 6-8 inches in diameter, or a folded towel between the side of your head and the wall.  · Slightly tuck your chin and gently tilt your head into the soft object. Push only with mild to moderate intensity, building up tension gradually. Keep your jaw and forehead relaxed.  · Hold 10 to 20 seconds. Keep your breathing relaxed.  · Release the tension slowly. Relax your neck muscles completely before you start the next  repetition.  Repeat __________ times. Complete this exercise __________ times per day.  STRENGTH - Cervical Extensors, Isometric   · Stand about 6 inches away from a wall. Place a small pillow, a ball about 6-8 inches in diameter, or a folded towel between the back of your head and the wall.  · Slightly tuck your chin and gently tilt your head back into the soft object. Push only with mild to moderate intensity, building up tension gradually. Keep your jaw and forehead relaxed.  · Hold 10 to 20 seconds. Keep your breathing relaxed.  · Release the tension slowly. Relax your neck muscles completely before you start the next repetition.  Repeat __________ times. Complete this exercise __________ times per day.    All of your joints have less wear and tear when properly supported by a spine with good posture. This means you will experience a healthier, less painful body. °· Correct posture must be practiced with all of your activities, especially prolonged sitting and standing. Correct posture is as important when doing repetitive low-stress activities (typing) as it is when doing a single heavy-load activity (lifting). °PROLONGED STANDING WHILE SLIGHTLY LEANING FORWARD °When completing a task that requires you to lean forward while standing in one  place for a long time, place either foot up on a stationary 2- to 4-inch high object to help maintain the best posture. When both feet are on the ground, the low back tends to lose its slight inward curve. If this curve flattens (or becomes too large), then the back and your other joints will experience too much stress, fatigue more quickly, and can cause pain.  °RESTING POSITIONS °Consider which positions are most painful for you when choosing a resting position. If you have pain with flexion-based activities (sitting, bending, stooping, squatting), choose a position that allows you to rest in a less flexed posture. You would want to avoid curling into a fetal position on your side. If your pain worsens with extension-based activities (prolonged standing, working overhead), avoid resting in an extended position such as sleeping on your stomach. Most people will find more comfort when they rest with their spine in a more neutral position, neither too rounded nor too arched. Lying on a non-sagging bed on your side with a pillow between your knees, or on your back with a pillow under your knees will often provide some relief. Keep in mind, being in any one position for a prolonged period of time, no matter how correct your posture, can still lead to stiffness. °WALKING °Walk with an upright posture. Your ears, shoulders, and hips should all line up. °OFFICE WORK °When working at a desk, create an environment that supports good, upright posture. Without extra support, muscles fatigue and lead to excessive strain on joints and other tissues. °CHAIR: °· A chair should be able to slide under your desk when your back makes contact with the back of the chair. This allows you to work closely. °· The chair's height should allow your eyes to be level with the upper part of your monitor and your hands to be slightly lower than your elbows. °· Body position: °¨ Your feet should make contact with the floor. If this is not  possible, use a foot rest. °¨ Keep your ears over your shoulders. This will reduce stress on your neck and low back. °  °This information is not intended to replace advice given to you by your health care provider. Make sure you discuss any questions you have with your health care provider. °  °Document Released: 01/02/2005 Document Revised: 01/23/2014 Document Reviewed: 04/16/2008 °[employer] Interactive Patient Education ©2016 [employer] °Motor Vehicle Collision °It is common to have multiple bruises and sore muscles after a motor vehicle collision (MVC). These tend to feel worse for the first 24 hours. You may have the most stiffness and soreness over the first several hours. You may also feel worse when you wake up the first morning after your collision. After this point, you will usually begin to improve with each day. The speed of improvement often depends on the severity of the collision, the number of injuries, and the location and nature of these injuries. °HOME CARE INSTRUCTIONS °· Put ice on the injured area. °·   Put ice in a plastic bag. °· Place a towel between your skin and the bag. °· Leave the ice on for 15-20 minutes, 3-4 times a day, or as directed by your health care provider. °· Drink enough fluids to keep your urine clear or pale yellow. Do not drink alcohol. °· Take a warm shower or bath once or twice a day. This will increase blood flow to sore muscles. °· You may return to activities as directed by your caregiver. Be careful when lifting, as this may aggravate neck or back pain. °· Only take over-the-counter or prescription medicines for pain, discomfort, or fever as directed by your caregiver. Do not use aspirin. This may increase bruising and bleeding. °SEEK IMMEDIATE MEDICAL CARE IF: °· You have numbness, tingling, or weakness in the arms or legs. °· You develop severe headaches not relieved with medicine. °· You have severe neck pain, especially tenderness in the middle of the back of  your neck. °· You have changes in bowel or bladder control. °· There is increasing pain in any area of the body. °· You have shortness of breath, light-headedness, dizziness, or fainting. °· You have chest pain. °· You feel sick to your stomach (nauseous), throw up (vomit), or sweat. °· You have increasing abdominal discomfort. °· There is blood in your urine, stool, or vomit. °· You have pain in your shoulder (shoulder strap areas). °· You feel your symptoms are getting worse. °MAKE SURE YOU: °· Understand these instructions. °· Will watch your condition. °· Will get help right away if you are not doing well or get worse. °  °This information is not intended to replace advice given to you by your health care provider. Make sure you discuss any questions you have with your health care provider. °  °Document Released: 01/02/2005 Document Revised: 01/23/2014 Document Reviewed: 06/01/2010 °[employer] Interactive Patient Education ©2016 [employer] ° °

## 2015-04-20 ENCOUNTER — Other Ambulatory Visit: Payer: Self-pay

## 2015-04-20 ENCOUNTER — Emergency Department (HOSPITAL_COMMUNITY): Payer: Managed Care, Other (non HMO)

## 2015-04-20 ENCOUNTER — Emergency Department (HOSPITAL_COMMUNITY)
Admission: EM | Admit: 2015-04-20 | Discharge: 2015-04-20 | Disposition: A | Discharge status: Home / Self Care | Payer: Managed Care, Other (non HMO) | Attending: Emergency Medicine | Admitting: Emergency Medicine

## 2015-04-20 ENCOUNTER — Encounter (HOSPITAL_COMMUNITY): Payer: Self-pay

## 2015-04-20 DIAGNOSIS — Z8679 Personal history of other diseases of the circulatory system: Secondary | ICD-10-CM | POA: Insufficient documentation

## 2015-04-20 DIAGNOSIS — R0602 Shortness of breath: Secondary | ICD-10-CM | POA: Diagnosis present

## 2015-04-20 DIAGNOSIS — K279 Peptic ulcer, site unspecified, unspecified as acute or chronic, without hemorrhage or perforation: Secondary | ICD-10-CM

## 2015-04-20 DIAGNOSIS — J029 Acute pharyngitis, unspecified: Secondary | ICD-10-CM | POA: Diagnosis not present

## 2015-04-20 DIAGNOSIS — Z3202 Encounter for pregnancy test, result negative: Secondary | ICD-10-CM | POA: Insufficient documentation

## 2015-04-20 DIAGNOSIS — K219 Gastro-esophageal reflux disease without esophagitis: Secondary | ICD-10-CM | POA: Diagnosis not present

## 2015-04-20 DIAGNOSIS — K273 Acute peptic ulcer, site unspecified, without hemorrhage or perforation: Secondary | ICD-10-CM | POA: Diagnosis not present

## 2015-04-20 DIAGNOSIS — K209 Esophagitis, unspecified without bleeding: Secondary | ICD-10-CM

## 2015-04-20 DIAGNOSIS — Z79899 Other long term (current) drug therapy: Secondary | ICD-10-CM | POA: Diagnosis not present

## 2015-04-20 LAB — CBC
HEMATOCRIT: 38.6 % (ref 36.0–46.0)
Hemoglobin: 12.9 g/dL (ref 12.0–15.0)
MCH: 30.4 pg (ref 26.0–34.0)
MCHC: 33.4 g/dL (ref 30.0–36.0)
MCV: 90.8 fL (ref 78.0–100.0)
Platelets: 288 10*3/uL (ref 150–400)
RBC: 4.25 MIL/uL (ref 3.87–5.11)
RDW: 12.3 % (ref 11.5–15.5)
WBC: 8.4 10*3/uL (ref 4.0–10.5)

## 2015-04-20 LAB — COMPREHENSIVE METABOLIC PANEL
ALT: 22 U/L (ref 14–54)
ANION GAP: 10 (ref 5–15)
AST: 20 U/L (ref 15–41)
Albumin: 4.1 g/dL (ref 3.5–5.0)
Alkaline Phosphatase: 43 U/L (ref 38–126)
BILIRUBIN TOTAL: 0.7 mg/dL (ref 0.3–1.2)
BUN: 8 mg/dL (ref 6–20)
CHLORIDE: 107 mmol/L (ref 101–111)
CO2: 22 mmol/L (ref 22–32)
Calcium: 9.7 mg/dL (ref 8.9–10.3)
Creatinine, Ser: 0.56 mg/dL (ref 0.44–1.00)
Glucose, Bld: 101 mg/dL — ABNORMAL HIGH (ref 65–99)
POTASSIUM: 3.7 mmol/L (ref 3.5–5.1)
Sodium: 139 mmol/L (ref 135–145)
TOTAL PROTEIN: 7.6 g/dL (ref 6.5–8.1)

## 2015-04-20 LAB — LIPASE, BLOOD: LIPASE: 48 U/L (ref 11–51)

## 2015-04-20 LAB — URINALYSIS, ROUTINE W REFLEX MICROSCOPIC
BILIRUBIN URINE: NEGATIVE
GLUCOSE, UA: NEGATIVE mg/dL
Hgb urine dipstick: NEGATIVE
KETONES UR: 15 mg/dL — AB
Leukocytes, UA: NEGATIVE
NITRITE: NEGATIVE
PH: 6 (ref 5.0–8.0)
Protein, ur: NEGATIVE mg/dL
Specific Gravity, Urine: 1.017 (ref 1.005–1.030)

## 2015-04-20 LAB — I-STAT BETA HCG BLOOD, ED (MC, WL, AP ONLY): I-stat hCG, quantitative: <5 m[IU]/mL (ref <5)

## 2015-04-20 IMAGING — DX DG CHEST 2V
2 series · 2 of 2 positions shown · non-contrast
Comparison: None

CLINICAL DATA: Shortness of breath.

EXAM:
CHEST - 2 VIEW

[chest pa]
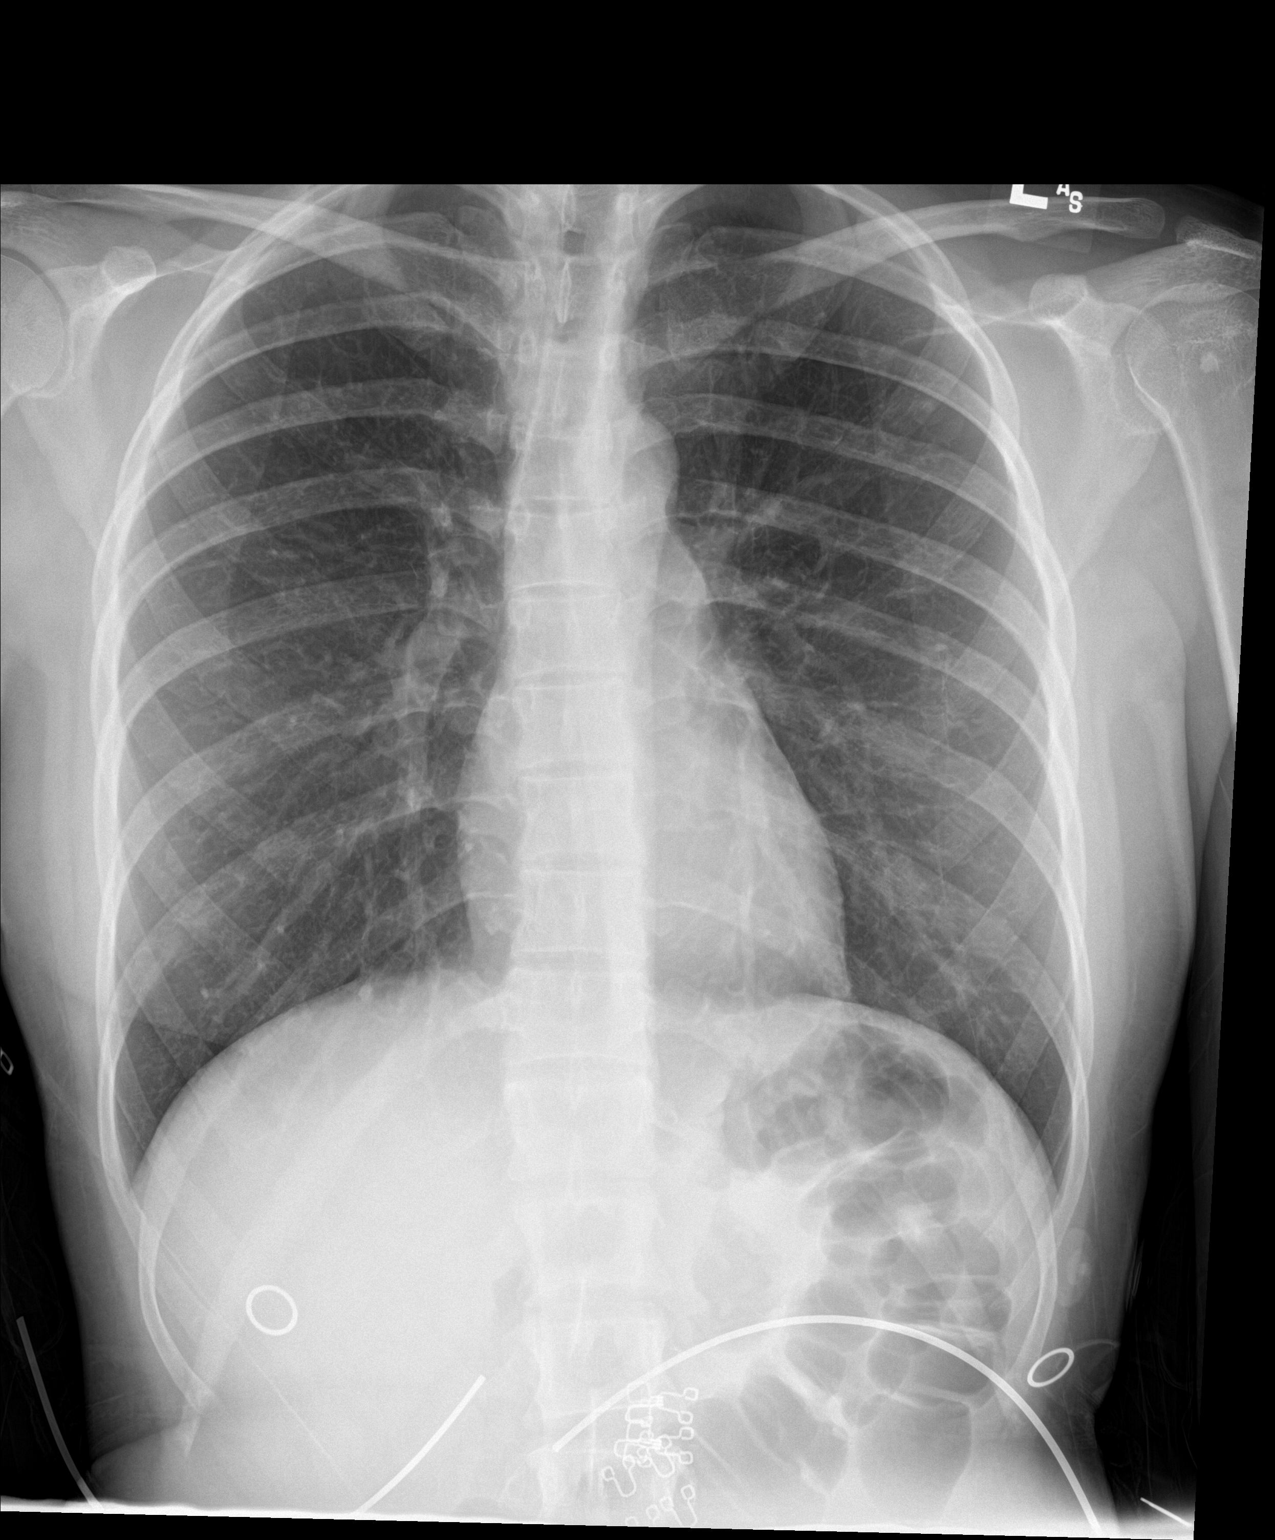

[chest lat]
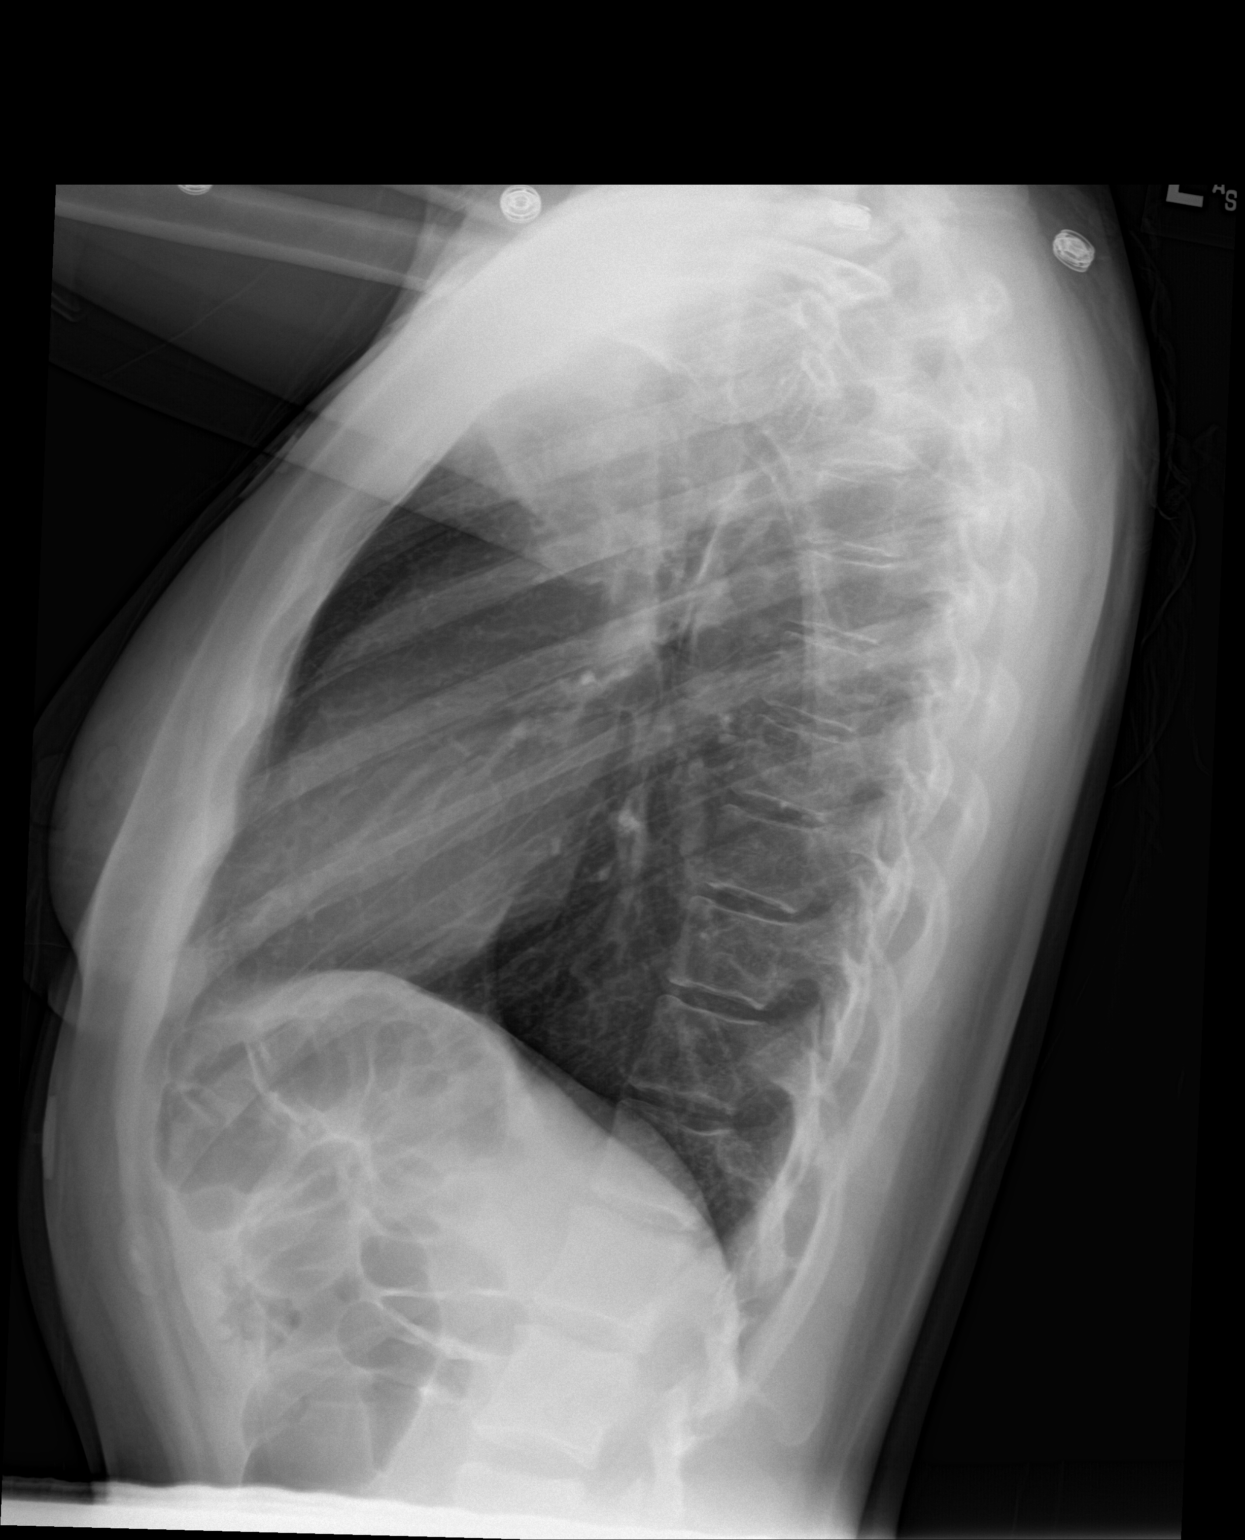

[2 of 2 positions shown; findings below may reference images not displayed]

FINDINGS: The heart size and mediastinal contours are within normal limits.
There is no evidence of pulmonary edema, consolidation,
pneumothorax, nodule or pleural fluid. The visualized skeletal
structures are unremarkable.
IMPRESSION: No active disease.

## 2015-04-20 MED ORDER — ONDANSETRON 4 MG PO TBDP: 4.0000 mg | ORAL_TABLET | Freq: Three times a day (TID) | ORAL | Status: DC | PRN

## 2015-04-20 MED ORDER — ONDANSETRON HCL 4 MG/2ML IJ SOLN
4.0000 mg | Freq: Once | INTRAMUSCULAR | Status: AC
  Administered 2015-04-20: 4 mg via INTRAVENOUS
  Filled 2015-04-20: qty 2

## 2015-04-20 MED ORDER — SUCRALFATE 1 GM/10ML PO SUSP: 1.0000 g | Freq: Three times a day (TID) | ORAL | Status: DC

## 2015-04-20 MED ORDER — SODIUM CHLORIDE 0.9 % IV BOLUS (SEPSIS)
1000.0000 mL | Freq: Once | INTRAVENOUS | Status: AC
  Administered 2015-04-20: 1000 mL via INTRAVENOUS

## 2015-04-20 MED ORDER — PANTOPRAZOLE SODIUM 20 MG PO TBEC: 20.0000 mg | DELAYED_RELEASE_TABLET | Freq: Every day | ORAL | Status: DC

## 2015-04-20 MED ORDER — GI COCKTAIL ~~LOC~~
30.0000 mL | Freq: Once | ORAL | Status: AC
  Administered 2015-04-20: 30 mL via ORAL
  Filled 2015-04-20: qty 30

## 2015-04-20 MED ORDER — PANTOPRAZOLE SODIUM 40 MG IV SOLR
40.0000 mg | INTRAVENOUS | Status: AC
  Administered 2015-04-20: 40 mg via INTRAVENOUS
  Filled 2015-04-20: qty 40

## 2015-04-20 MED ORDER — KETOROLAC TROMETHAMINE 15 MG/ML IJ SOLN
15.0000 mg | Freq: Once | INTRAMUSCULAR | Status: AC
  Administered 2015-04-20: 15 mg via INTRAVENOUS
  Filled 2015-04-20: qty 1

## 2015-04-20 NOTE — ED Provider Notes (Signed)
CSN: [id number]     Arrival date & time 04/20/15  1636 History   First MD Initiated Contact with Patient 04/20/15 2034     Chief Complaint  Patient presents with  . Shortness of Breath     (Consider location/radiation/quality/duration/timing/severity/associated sxs/prior Treatment) HPI Comments: 30 year old female with a history of acid reflux and endometriosis presents to the emergency department for evaluation of abdominal pain. Patient states that she has been experiencing epigastric abdominal pain constantly since taking Tamiflu beginning 1 week ago. She reports associated sore throat and pain with swallowing. This has caused her to limit her oral food and fluid intake as swallowing causes her pain. She reports feeling a burning pain in the center of her chest down to her epigastrium. Symptoms further associated with nausea and vomiting. She reports too numerous to count episodes of emesis. She characterizes her emesis as a white foam. Patient also complaining of worsening pain with inspiration causing her to feel short of breath. Patient states that she discontinued Tamiflu after 3 doses. She has tried taking Nexium, but does not feel as though this has helped her symptoms. She denies fever, inability to swallow or drooling, hematemesis, diarrhea, melena, hematochezia, urinary symptoms, and a history of abdominal surgeries. She reports that most of her flulike symptoms have resolved.  Patient is a 30 y.o. female presenting with shortness of breath. The history is provided by the patient. No language interpreter was used.  Shortness of Breath Associated symptoms: abdominal pain, sore throat and vomiting   Associated symptoms: no fever     Past Medical History  Diagnosis Date  . Acid reflux   . Anemia   . Abnormal Pap smear   . GBS carrier   . H/O varicella   . FH: migraines   . Hyperemesis arising during pregnancy     First pregnancy  . H/O rubella   . Endometriosis 10/2004  .  Irregular periods/menstrual cycles 02/2005  . H/O dyspareunia 7/07  . H/O amenorrhea 06/2006  . H/O fatigue   . Epigastric pain 10/2006  . Decreased appetite 10/08  . H/O nausea and vomiting 10/2006  . Pelvic pain 09/2008   History reviewed. No pertinent past surgical history. Family History  Problem Relation Age of Onset  . Migraines Mother    Social History  Substance Use Topics  . Smoking status: Never Smoker   . Smokeless tobacco: Never Used  . Alcohol Use: No   OB History    Gravida Para Term Preterm AB TAB SAB Ectopic Multiple Living   2 2 2       2       Review of Systems  Constitutional: Negative for fever.  HENT: Positive for sore throat. Negative for drooling.        +dysphagia  Respiratory: Positive for shortness of breath.   Gastrointestinal: Positive for nausea, vomiting and abdominal pain. Negative for blood in stool.  Genitourinary: Negative for dysuria and hematuria.  All other systems reviewed and are negative.   Allergies  Review of patient's allergies indicates no known allergies.  Home Medications   Prior to Admission medications   Medication Sig Start Date End Date Taking? Authorizing Provider  drospirenone-ethinyl estradiol (YASMIN 28) 3-0.03 MG tablet Take 1 tablet by mouth daily. 06/22/11 06/21/12  Earnstine Regal, PA-C  HYDROcodone-acetaminophen (NORCO/VICODIN) 5-325 MG tablet Take 1-2 tablets by mouth every 6 (six) hours as needed. 02/26/15   Montine Circle, PA-C  levonorgestrel (MIRENA) 20 MCG/24HR IUD 1 each by Intrauterine route  once.    Historical Provider, MD  ondansetron (ZOFRAN ODT) 4 MG disintegrating tablet Take 1 tablet (4 mg total) by mouth every 8 (eight) hours as needed for nausea or vomiting. 04/20/15   Antonietta Breach, PA-C  pantoprazole (PROTONIX) 20 MG tablet Take 1 tablet (20 mg total) by mouth daily. 04/20/15   Antonietta Breach, PA-C  sucralfate (CARAFATE) 1 GM/10ML suspension Take 10 mLs (1 g total) by mouth 4 (four) times daily -  with meals and  at bedtime. 04/20/15   Antonietta Breach, PA-C   BP 101/67 mmHg  Pulse 75  Temp(Src) 98.4 F (36.9 C) (Oral)  Resp 16  SpO2 100%  LMP 03/28/2015   Physical Exam  Constitutional: She is oriented to person, place, and time. She appears well-developed and well-nourished. No distress.  Nontoxic/nonseptic appearing  HENT:  Head: Normocephalic and atraumatic.  Patient tolerating secretions without difficulty. No drooling or tripoding. No stridor noted.  Eyes: Conjunctivae and EOM are normal. No scleral icterus.  Neck: Normal range of motion.  Cardiovascular: Normal rate, regular rhythm and intact distal pulses.   Pulmonary/Chest: Effort normal. No respiratory distress. She has no wheezes. She has no rales.  Respirations even and unlabored. Lungs clear to auscultation bilaterally.  Abdominal: Soft. Normal appearance and bowel sounds are normal. She exhibits no distension. There is tenderness. There is no rebound and no guarding.    Focal tenderness noted in the epigastric abdomen. Abdomen is soft with normoactive bowel sounds. No rigidity or guarding; no peritoneal signs.  Musculoskeletal: Normal range of motion.  Neurological: She is alert and oriented to person, place, and time. She exhibits normal muscle tone. Coordination normal.  Patient ambulatory with steady gait. GCS 15.  Skin: Skin is warm and dry. No rash noted. She is not diaphoretic. No erythema. No pallor.  Psychiatric: She has a normal mood and affect. Her behavior is normal.  Nursing note and vitals reviewed.   ED Course  Procedures (including critical care time) Labs Review Labs Reviewed  COMPREHENSIVE METABOLIC PANEL - Abnormal; Notable for the following:    Glucose, Bld 101 (*)    All other components within normal limits  URINALYSIS, ROUTINE W REFLEX MICROSCOPIC (NOT AT [facility]) - Abnormal; Notable for the following:    Color, Urine AMBER (*)    APPearance CLOUDY (*)    Ketones, ur 15 (*)    All other components within  normal limits  LIPASE, BLOOD  CBC  I-STAT BETA HCG BLOOD, ED ([facility], [facility], [facility] ONLY)    Imaging Review Dg Chest 2 View  04/20/2015  CLINICAL DATA:  Shortness of breath. EXAM: CHEST - 2 VIEW COMPARISON:  None FINDINGS: The heart size and mediastinal contours are within normal limits. There is no evidence of pulmonary edema, consolidation, pneumothorax, nodule or pleural fluid. The visualized skeletal structures are unremarkable. IMPRESSION: No active disease. Electronically Signed   By: Aletta Edouard M.D.   On: 04/20/2015 21:56     I have personally reviewed and evaluated these images and lab results as part of my medical decision-making.   EKG Interpretation None      10:39 PM Patient states that her symptoms have greatly improved. She is able to tolerate water without difficulty. No c/o pain with PO intake. No N/V. Plan to discharge with Protonix and Carafate for outpatient management. Patient has been informed of the results of her Xray; she verbalizes understanding.   Medications  sodium chloride 0.9 % bolus 1,000 mL (0 mLs Intravenous Stopped 04/20/15  2251)  pantoprazole (PROTONIX) injection 40 mg (40 mg Intravenous Given 04/20/15 2144)  gi cocktail (Maalox,Lidocaine,Donnatal) (30 mLs Oral Given 04/20/15 2134)  ondansetron (ZOFRAN) injection 4 mg (4 mg Intravenous Given 04/20/15 2224)  ketorolac (TORADOL) 15 MG/ML injection 15 mg (15 mg Intravenous Given 04/20/15 2224)    MDM   Final diagnoses:  Esophagitis  PUD (peptic ulcer disease)    30 year old female with history of esophageal reflux presents to the emergency department for throat, chest, and abdominal pain which began shortly after taking Tamiflu for influenza. Patient reports that most of her flulike symptoms have resolved. She reports pain with swallowing solids or liquids, causing decrease in oral intake. Patient with a soft abdomen with normoactive bowel sounds. No peritoneal signs. She has focal tenderness in her epigastric  abdomen which is likely secondary to peptic ulcer disease. Suspect esophagitis from frequent vomiting. Symptoms of esophagitis may have also been pill-induced after patient started taking Tamiflu. Left right workup is noncontributory.  Patient has been treated in the emergency department with fluids as well as Protonix, GI cocktail, Zofran, and Toradol. She reports significant improvement in her pain and states that she is feeling much better. She is able to tolerate oral fluids without acute worsening of her symptoms. Plan to discharge with Protonix and Carafate for outpatient management. Patient referred to her primary care doctor for follow-up. Return precautions discussed and provided. Patient discharged in satisfactory condition with no unaddressed concerns; VSS.   Filed Vitals:   04/20/15 1642 04/20/15 1748  BP: 117/74 101/67  Pulse: 91 75  Temp: 98.4 F (36.9 C)   TempSrc: Oral   Resp: 18 16  SpO2: 99% 100%     Antonietta Breach, PA-C 04/20/15 2254  Noemi Chapel, MD 04/20/15 2308

## 2015-04-20 NOTE — Discharge Instructions (Signed)
Esophagitis °Esophagitis is inflammation of the esophagus. The esophagus is the tube that carries food and liquids from your mouth to your stomach. Esophagitis can cause soreness or pain in the esophagus. This condition can make it difficult and painful to swallow.  °CAUSES °Most causes of esophagitis are not serious. Common causes of this condition include: °· Gastroesophageal reflux disease (GERD). This is when stomach contents move back up into the esophagus (reflux). °· Repeated vomiting. °· An allergic-type reaction, especially caused by food allergies (eosinophilic esophagitis). °· Injury to the esophagus by swallowing large pills with or without water, or swallowing certain types of medicines. °· Swallowing (ingesting) harmful chemicals, such as household cleaning products. °· Heavy alcohol use. °· An infection of the esophagus. This most often occurs in people who have a weakened immune system. °· Radiation or chemotherapy treatment for cancer. °· Certain diseases such as sarcoidosis, Crohn disease, and scleroderma. °SYMPTOMS °Symptoms of this condition include: °· Difficult or painful swallowing. °· Pain with swallowing acidic liquids, such as citrus juices. °· Pain with burping. °· Chest pain. °· Difficulty breathing. °· Nausea. °· Vomiting. °· Pain in the abdomen. °· Weight loss. °· Ulcers in the mouth. °· Patches of white material in the mouth (candidiasis). °· Fever. °· Coughing up blood or vomiting blood. °· Stool that is black, tarry, or bright red. °DIAGNOSIS °Your health care provider will take a medical history and perform a physical exam. You may also have other tests, including: °· An endoscopy to examine your stomach and esophagus with a small camera. °· A test that measures the acidity level in your esophagus. °· A test that measures how much pressure is on your esophagus. °· A barium swallow or modified barium swallow to show the shape, size, and functioning of your esophagus. °· Allergy  tests. °TREATMENT °Treatment for this condition depends on the cause of your esophagitis. In some cases, steroids or other medicines may be given to help relieve your symptoms or to treat the underlying cause of your condition. You may have to make some lifestyle changes, such as: °· Avoiding alcohol. °· Quitting smoking. °· Changing your diet. °· Exercising. °· Changing your sleep habits and your sleep environment. °HOME CARE INSTRUCTIONS °Take these actions to decrease your discomfort and to help avoid complications. °Diet °· Follow a diet as recommended by your health care provider. This may involve avoiding foods and drinks such as: °¨ Coffee and tea (with or without caffeine). °¨ Drinks that contain alcohol. °¨ Energy drinks and sports drinks. °¨ Carbonated drinks or sodas. °¨ Chocolate and cocoa. °¨ Peppermint and mint flavorings. °¨ Garlic and onions. °¨ Horseradish. °¨ Spicy and acidic foods, including peppers, chili powder, curry powder, vinegar, hot sauces, and barbecue sauce. °¨ Citrus fruit juices and citrus fruits, such as oranges, lemons, and limes. °¨ Tomato-based foods, such as red sauce, chili, salsa, and pizza with red sauce. °¨ Fried and fatty foods, such as donuts, french fries, potato chips, and high-fat dressings. °¨ High-fat meats, such as hot dogs and fatty cuts of red and white meats, such as rib eye steak, sausage, ham, and bacon. °¨ High-fat dairy items, such as whole milk, butter, and cream cheese. °· Eat small, frequent meals instead of large meals. °· Avoid drinking large amounts of liquid with your meals. °· Avoid eating meals during the 2-3 hours before bedtime. °· Avoid lying down right after you eat. °· Do not exercise right after you eat. °· Avoid foods and drinks that seem to   make your symptoms worse. General Instructions  Pay attention to any changes in your symptoms.  Take over-the-counter and prescription medicines only as told by your health care provider. Do not take  aspirin, ibuprofen, or other NSAIDs unless your health care provider told you to do so.  If you have trouble taking pills, use a pill splitter to decrease the size of the pill. This will decrease the chance of the pill getting stuck or injuring your esophagus on the way down. Also, drink water after you take a pill.  Do not use any tobacco products, including cigarettes, chewing tobacco, and e-cigarettes. If you need help quitting, ask your health care provider.  Wear loose-fitting clothing. Do not wear anything tight around your waist that causes pressure on your abdomen.  Raise (elevate) the head of your bed about 6 inches (15 cm).  Try to reduce your stress, such as with yoga or meditation. If you need help reducing stress, ask your health care provider.  If you are overweight, reduce your weight to an amount that is healthy for you. Ask your health care provider for guidance about a safe weight loss goal.  Keep all follow-up visits as told by your health care provider. This is important. SEEK MEDICAL CARE IF:  You have new symptoms.  You have unexplained weight loss.  You have difficulty swallowing, or it hurts to swallow.  You have wheezing or a persistent cough.  Your symptoms do not improve with treatment.  You have frequent heartburn for more than two weeks. SEEK IMMEDIATE MEDICAL CARE IF:  You have severe pain in your arms, neck, jaw, teeth, or back.  You feel sweaty, dizzy, or light-headed.  You have chest pain or shortness of breath.  You vomit and your vomit looks like blood or coffee grounds.  Your stool is bloody or black.  You have a fever.  You cannot swallow, drink, or eat.   This information is not intended to replace advice given to you by your health care provider. Make sure you discuss any questions you have with your health care provider.   Document Released: 02/10/2004 Document Revised: 09/23/2014 Document Reviewed: 04/29/2014 Elsevier Interactive  Patient Education 2016 [place] for Peptic Ulcer Disease When you have peptic ulcer disease, the foods you eat and your eating habits are very important. Choosing the right foods can help ease the discomfort of peptic ulcer disease. WHAT GENERAL GUIDELINES DO I NEED TO FOLLOW?  Choose fruits, vegetables, whole grains, and low-fat meat, fish, and poultry.   Keep a food diary to identify foods that cause symptoms.  Avoid foods that cause irritation or pain. These may be different for different people.  Eat frequent small meals instead of three large meals each day. The pain may be worse when your stomach is empty.  Avoid eating close to bedtime. WHAT FOODS ARE NOT RECOMMENDED? The following are some foods and drinks that may worsen your symptoms:  Black, white, and red pepper.  Hot sauce.  Chili peppers.  Chili powder.  Chocolate and cocoa.   Alcohol.  Tea, coffee, and cola (regular and decaffeinated). The items listed above may not be a complete list of foods and beverages to avoid. Contact your dietitian for more information.   This information is not intended to replace advice given to you by your health care provider. Make sure you discuss any questions you have with your health care provider.   Document Released: 03/27/2011 Document Revised: 01/07/2013  Document Reviewed: 11/06/2012 Elsevier Interactive Patient Education [employer].

## 2015-04-20 NOTE — ED Notes (Signed)
Patient transported to X-ray 

## 2015-04-20 NOTE — ED Notes (Signed)
Pt reports SOB, emesis, lack of sleep. She reports she was diagnosed with the flu last ween and did not finish the Tamiflu. Respirations unlabored, no acute distress.

## 2015-04-22 ENCOUNTER — Inpatient Hospital Stay: Payer: Commercial Indemnity

## 2016-01-28 ENCOUNTER — Encounter: Payer: Self-pay | Admitting: Physician Assistant

## 2016-02-04 ENCOUNTER — Ambulatory Visit: Payer: Managed Care, Other (non HMO) | Admitting: Physician Assistant

## 2016-02-08 ENCOUNTER — Ambulatory Visit: Payer: Managed Care, Other (non HMO) | Admitting: Physician Assistant

## 2016-02-10 ENCOUNTER — Ambulatory Visit: Payer: Managed Care, Other (non HMO) | Admitting: Physician Assistant

## 2016-02-11 ENCOUNTER — Ambulatory Visit (INDEPENDENT_AMBULATORY_CARE_PROVIDER_SITE_OTHER): Payer: Managed Care, Other (non HMO) | Admitting: Physician Assistant

## 2016-02-11 ENCOUNTER — Other Ambulatory Visit: Payer: Managed Care, Other (non HMO)

## 2016-02-11 ENCOUNTER — Encounter (INDEPENDENT_AMBULATORY_CARE_PROVIDER_SITE_OTHER): Payer: Self-pay

## 2016-02-11 ENCOUNTER — Encounter: Payer: Self-pay | Admitting: Physician Assistant

## 2016-02-11 VITALS — BP 110/70 | HR 64 | Ht 62.0 in | Wt 127.0 lb

## 2016-02-11 DIAGNOSIS — K5900 Constipation, unspecified: Secondary | ICD-10-CM

## 2016-02-11 DIAGNOSIS — R1013 Epigastric pain: Secondary | ICD-10-CM | POA: Diagnosis not present

## 2016-02-11 DIAGNOSIS — R634 Abnormal weight loss: Secondary | ICD-10-CM

## 2016-02-11 DIAGNOSIS — Z8619 Personal history of other infectious and parasitic diseases: Secondary | ICD-10-CM

## 2016-02-11 DIAGNOSIS — R12 Heartburn: Secondary | ICD-10-CM

## 2016-02-11 DIAGNOSIS — R110 Nausea: Secondary | ICD-10-CM

## 2016-02-11 MED ORDER — HYOSCYAMINE SULFATE 0.125 MG SL SUBL
0.1250 mg | SUBLINGUAL_TABLET | SUBLINGUAL | 0 refills | Status: DC | PRN
Start: 2016-02-11 — End: 2017-04-12

## 2016-02-11 NOTE — Progress Notes (Signed)
Chief Complaint: Abdominal pain  HPI:  Caitlyn Miller is a 31 year old female from [place],  who was referred to me by Bartholome Bill, MD for a complaint of chronic abdominal pain .  Patient has never been seen in our clinic previously but has followed with a gastroenterologist "[place] the states" before and has followed with a gastroenterologist in [place].   Review of chart shows that patient has had persistent epigastric pain for years. She reports extensive evaluation in the past with no identified cause. She did have an H. pylori stool antigen positive in April 2017 but had no change in symptoms after treatment. She has also had 2 EGDs. She denies colonoscopy. She also reports multiple abdominal imaging including a CT scan while in [place]. We do not have these reports today. Per referring physician's notes patient is now complaining of joint pain, hair thinning, loss of appetite and chronic fatigue. She is being worked up for these as well.   Recent labs show a normal CMP, iron studies, TSH, vitamin B12, urinalysis and CBC completed on 12/24/15. Vitamin D was also normal.   Today, the patient tells me that she has had epigastric abdominal pain for at least 10 years. She has been back and forth to [place] over that time. She had multiple workups including ultrasounds, CTs, labs and 2 EGD's. She reports last EGD was done with biopsy last summer and continued to be negative. She was diagnosed with H. pylori and treated in April 2017, but still had no change in symptoms after antibiotics and a PPI. Patient tells me she has been on a lot of medication in the past including Zofran and pantoprazole, she stopped all of these medicines about 3 months ago because she didn't feel like they were helping. Since that time she has continued with an epigastric pain which she describes as a "knife sticking in my stomach". This tends to be worse in the mornings when she wakes up and when she has an empty stomach. She  describes accompanying nausea which occurs every day and is worse in the morning. She denies episodes of vomiting. She does tell me she has occasional symptoms of heartburn and reflux but had no change after using the pantoprazole. This pain sometimes even awakens her at night. She does describe constantly losing weight at least a pound a week as well as being severely fatigued.  She believes all of her symtpoms including fatigue, hair loss and joint pain are due to something being wrong in her "gi system".   Patient also describes alternation in bowel habits between diarrhea and constipation.   Patient does tell me that she has a fairly stressful life with 2 children and going back and forth from [place] as well as trying to go to school. She is very frustrated as she has continued with abdominal pain for 10 years and no one has been able to tell her what is wrong.   Patient's social history is positive for leading a healthy lifestyle, exercising on a daily basis and eating well.   Patient denies fever, chills, blood in her stool, melena, change in diet recently, recent antibiotics, dysphagia or increase in gas or bloating.  Past Medical History:  Diagnosis Date  . Abnormal Pap smear   . Acid reflux   . Anemia   . Decreased appetite 10/08  . Endometriosis 10/2004  . Epigastric pain 10/2006  . FH: migraines   . GBS carrier   . H/O amenorrhea 06/2006  .  H/O dyspareunia 7/07  . H/O fatigue   . H/O nausea and vomiting 10/2006  . H/O rubella   . H/O varicella   . Hyperemesis arising during pregnancy    First pregnancy  . Irregular periods/menstrual cycles 02/2005  . Pelvic pain 09/2008    Past Surgical History:  Procedure Laterality Date  . WISDOM TOOTH EXTRACTION      Current Outpatient Prescriptions  Medication Sig Dispense Refill  . hyoscyamine (LEVSIN SL) 0.125 MG SL tablet Place 1 tablet (0.125 mg total) under the tongue every 4 (four) hours as needed. 30 tablet 0   No current  facility-administered medications for this visit.     Allergies as of 02/11/2016  . (No Known Allergies)    Family History  Problem Relation Age of Onset  . Migraines Mother   . Hypertension Father   . Colon cancer Neg Hx   . Stomach cancer Neg Hx     Social History   Social History  . Marital status: Married    Spouse name: N/A  . Number of children: N/A  . Years of education: N/A   Occupational History  . Not on file.   Social History Main Topics  . Smoking status: Never Smoker  . Smokeless tobacco: Never Used  . Alcohol use No  . Drug use: No  . Sexual activity: No   Other Topics Concern  . Not on file   Social History Narrative  . No narrative on file    Review of Systems:     Constitutional: Positive for weight loss, weakness and fatigue No fever or chills HEENT: Eyes: No change in vision               Ears, Nose, Throat:  No change in hearing  Skin: No rash or itching Cardiovascular: No chest pain, chest pressure or palpitations   Respiratory: No SOB or cough Gastrointestinal: See HPI and otherwise negative Genitourinary: No dysuria or change in urinary frequency Neurological: No headache, dizziness or syncope Musculoskeletal: Positive for back pain Hematologic: No bleeding or bruising Psychiatric: No history of depression or anxiety   Physical Exam:  Vital signs: BP 110/70   Pulse 64   Ht 5\' 2"  (1.575 m)   Wt 127 lb (57.6 kg)   BMI 23.23 kg/m   Constitutional:   Pleasant Thin-appearing female appears to be in NAD, Well developed, Well nourished, alert and cooperative Head:  Normocephalic and atraumatic. Eyes:   PEERL, EOMI. No icterus. Conjunctiva pink. Ears:  Normal auditory acuity. Neck:  Supple Throat: Oral cavity and pharynx without inflammation, swelling or lesion.  Respiratory: Respirations even and unlabored. Lungs clear to auscultation bilaterally.   No wheezes, crackles, or rhonchi.  Cardiovascular: Normal S1, S2. No MRG. Regular  rate and rhythm. No peripheral edema, cyanosis or pallor.  Gastrointestinal:  Soft, nondistended, moderate epigastric tenderness, mild left lower quadrant abdominal pain. No rebound or guarding. Normal bowel sounds. No appreciable masses or hepatomegaly. Rectal:  Not performed.  Msk:  Symmetrical without gross deformities. Without edema, no deformity or joint abnormality.  Neurologic:  Alert and  oriented x4;  grossly normal neurologically.  Skin:   Dry and intact without significant lesions or rashes. Psychiatric: Demonstrates good judgement and reason without abnormal affect or behaviors.  RELEVANT LABS AND IMAGING: CBC    Component Value Date/Time   WBC 8.4 04/20/2015 1658   RBC 4.25 04/20/2015 1658   HGB 12.9 04/20/2015 1658   HCT 38.6 04/20/2015 1658  PLT 288 04/20/2015 1658   MCV 90.8 04/20/2015 1658   MCH 30.4 04/20/2015 1658   MCHC 33.4 04/20/2015 1658   RDW 12.3 04/20/2015 1658    CMP     Component Value Date/Time   NA 139 04/20/2015 1658   K 3.7 04/20/2015 1658   CL 107 04/20/2015 1658   CO2 22 04/20/2015 1658   GLUCOSE 101 (H) 04/20/2015 1658   BUN 8 04/20/2015 1658   CREATININE 0.56 04/20/2015 1658   CALCIUM 9.7 04/20/2015 1658   PROT 7.6 04/20/2015 1658   ALBUMIN 4.1 04/20/2015 1658   AST 20 04/20/2015 1658   ALT 22 04/20/2015 1658   ALKPHOS 43 04/20/2015 1658   BILITOT 0.7 04/20/2015 1658   GFRNONAA >60 04/20/2015 1658   GFRAA >60 04/20/2015 1658    Assessment: 1. Abdominal pain: For 10 years per the patient, epigastric, 2 EGDs and a CT of the abdomen and pelvis in [place], we do not have reports, patient tells me these were negative, did have positive H. pylori fecal antigen in April of last year, treated with no change in abdominal pain, no record or retesting; Consider functional dyspepsia versus H. pylori versus other 2. Heartburn and reflux: Very intermittent per the patient, no change when she was on pantoprazole, she does not wish to restart this  medication today 3. Alternating bowel habits: Between diarrhea and constipation, likely related to IBS due to high stress and anxiety 4. Weight loss: Patient reports she cannot gain weight but she also eats very healthy and exercises on a daily basis 5. History of H. pylori: In April 2017, no testing for clearance after treatment  Plan: 1. At this time recommend that we try to gather patient's previous workup including what she tells me was 2 EGDs, ultrasound, CT and labs.  2. Patient is requesting an MRI of the abdomen today for further evaluation. She feels like this will tells something as this is the only test that has not been done. Expressed that I would like to receive her other reports first, but will go ahead and order this for her. 3. Did discuss possibility of a colonoscopy in the future to consider IBS. She would like to wait on this at this time 4. Recommend the patient trial low fodmap diet for least 6 weeks, provided her with a handout 5. Prescribed hyoscyamine sulfate 0.125 mg every 4-6 hours as needed for abdominal pain 6. Provided patient with samples of FD Guard to take 2 tabs twice a day, continue for at least 2 months 7. Will test for clearance of H. pylori, especially as patient is not on a PPI at this time with a fecal antigen test 8. Did discuss with the patient that she should find ways to decrease stress and anxiety in her life of this could be a primary cause of functional abdominal pain 9. Patient to return clinic with Dr. Fuller Plan as he is the supervising physician this morning in the next 3-4 weeks.  Caitlyn Newer, PA-C [facility] 02/11/2016, 1:28 PM  Cc: Bartholome Bill, MD

## 2016-02-11 NOTE — Patient Instructions (Addendum)
You have been scheduled for an MRI at [facility] on ____________. Your appointment time is ___________. Please arrive 15 minutes prior to your appointment time for registration purposes. Please make certain not to have anything to eat or drink 6 hours prior to your test. In addition, if you have any metal in your body, have a pacemaker or defibrillator, please be sure to let your ordering physician know. This test typically takes 45 minutes to 1 hour to complete.  You can call [phone] to schedule your appointment.   Please purchase the following medications over the counter and take as directed: FD guard 2 tabs twice a day  We have given you a low FODMAP diet  We have sent the following medications to your pharmacy for you to pick up at your convenience: Hycosyamine 0.125 mg sublingual  Your physician has requested that you go to the basement for lab work before leaving today.

## 2016-02-11 NOTE — Progress Notes (Signed)
Reviewed and agree with management plan.  Zavion Sleight T. Beula Joyner, MD FACG 

## 2016-03-01 ENCOUNTER — Other Ambulatory Visit: Payer: Managed Care, Other (non HMO)

## 2016-03-01 DIAGNOSIS — R110 Nausea: Secondary | ICD-10-CM

## 2016-03-01 DIAGNOSIS — K5900 Constipation, unspecified: Secondary | ICD-10-CM

## 2016-03-01 DIAGNOSIS — R634 Abnormal weight loss: Secondary | ICD-10-CM

## 2016-03-01 DIAGNOSIS — R12 Heartburn: Secondary | ICD-10-CM

## 2016-03-01 DIAGNOSIS — R1013 Epigastric pain: Secondary | ICD-10-CM

## 2016-03-02 LAB — HELICOBACTER PYLORI  SPECIAL ANTIGEN: H. PYLORI ANTIGEN STOOL: NOT DETECTED

## 2016-03-20 ENCOUNTER — Telehealth: Payer: Self-pay

## 2016-03-20 ENCOUNTER — Ambulatory Visit: Payer: Managed Care, Other (non HMO) | Admitting: Gastroenterology

## 2016-03-20 NOTE — Telephone Encounter (Signed)
Yes charge 

## 2016-03-20 NOTE — Telephone Encounter (Signed)
Do you want to charge? 

## 2017-04-12 ENCOUNTER — Ambulatory Visit: Payer: Managed Care, Other (non HMO) | Admitting: Physician Assistant

## 2017-04-12 ENCOUNTER — Encounter: Payer: Self-pay | Admitting: Physician Assistant

## 2017-04-12 ENCOUNTER — Ambulatory Visit: Payer: PRIVATE HEALTH INSURANCE | Admitting: Physician Assistant

## 2017-04-12 ENCOUNTER — Other Ambulatory Visit: Payer: Self-pay

## 2017-04-12 VITALS — BP 114/73 | HR 65 | Temp 98.2°F | Resp 16 | Ht 62.0 in | Wt 130.0 lb

## 2017-04-12 DIAGNOSIS — M2550 Pain in unspecified joint: Secondary | ICD-10-CM

## 2017-04-12 DIAGNOSIS — R0789 Other chest pain: Secondary | ICD-10-CM | POA: Diagnosis not present

## 2017-04-12 DIAGNOSIS — Z87440 Personal history of urinary (tract) infections: Secondary | ICD-10-CM

## 2017-04-12 LAB — POCT URINALYSIS DIP (MANUAL ENTRY)
BILIRUBIN UA: NEGATIVE
Blood, UA: NEGATIVE
GLUCOSE UA: NEGATIVE mg/dL
LEUKOCYTES UA: NEGATIVE
Nitrite, UA: NEGATIVE
PH UA: 5.5 (ref 5.0–8.0)
Protein Ur, POC: NEGATIVE mg/dL
Spec Grav, UA: 1.025 (ref 1.010–1.025)
Urobilinogen, UA: 0.2 E.U./dL

## 2017-04-12 LAB — POCT URINE PREGNANCY: Preg Test, Ur: NEGATIVE

## 2017-04-12 NOTE — Patient Instructions (Addendum)
  I am obtaining labs at this time.  I would like you to await this contact for results.   I will hold off on meds at this time, and see what the results are.     IF you received an x-ray today, you will receive an invoice from [facility]. Please contact [facility] at [phone] with questions or concerns regarding your invoice.   IF you received labwork today, you will receive an invoice from [facility]. Please contact [facility] at [phone] with questions or concerns regarding your invoice.   Our billing staff will not be able to assist you with questions regarding bills from these companies.  You will be contacted with the lab results as soon as they are available. The fastest way to get your results is to activate your My Chart account. Instructions are located on the last page of this paperwork. If you have not heard from [place] regarding the results in 2 weeks, please contact this office.

## 2017-04-12 NOTE — Progress Notes (Signed)
PRIMARY CARE AT [facility] [address] [place] [address] [phone]  Date:  04/12/2017   Name:  Caitlyn Miller   DOB:  October 19, 1985   MRN:  [id number]  PCP:  Patient, No Pcp Per    History of Present Illness:  Caitlyn Miller is a 32 y.o. female patient who presents to PCP with  Chief Complaint  Patient presents with  . Back Pain    x 4 month  . Chest Pain    x 4 months/ pressure at times, sob  . Neck Pain    pt thinks it may be her thyriod     Patient has chest pain at the center of her chest, and through to her shoulder. The pain goes from her arm and her legs.  She feels like she is dying.  She has no headaches.  She may have some vision changes, and dizziness.  She has palpitations, though she may think this is part of her medications.   She has noticed acne and unwanted around nipple hairs.  No sweating, however she does have difficulty with breathing due to it.   She has noticed acne along her chest.  No change in cleansers or moisturizers.   No urinary symptoms, no dysuria, hematuria, or frequency.   She uses the librex, aliviar.   Back pain along shoulders.  She is not exercising.  No heavy lifting.  She is not currently working.    Menses: periods are irregular, and cycle can be slightly more or less 5-6 days.  No heavy bleeding.   She has been evaluated by gastro without findings.. Denies any current stressors.  Patient Active Problem List   Diagnosis Date Noted  . Hx of migraines 06/20/2011  . GERD (gastroesophageal reflux disease) 06/20/2011  . Anemia 06/20/2011    Past Medical History:  Diagnosis Date  . Abnormal Pap smear   . Acid reflux   . Anemia   . Decreased appetite 10/08  . Endometriosis 10/2004  . Epigastric pain 10/2006  . FH: migraines   . GBS carrier   . H/O amenorrhea 06/2006  . H/O dyspareunia 7/07  . H/O fatigue   . H/O nausea and vomiting 10/2006  . H/O rubella   . H/O varicella   . Hyperemesis arising during pregnancy    First  pregnancy  . Irregular periods/menstrual cycles 02/2005  . Pelvic pain 09/2008    Past Surgical History:  Procedure Laterality Date  . WISDOM TOOTH EXTRACTION      Social History   Tobacco Use  . Smoking status: Never Smoker  . Smokeless tobacco: Never Used  Substance Use Topics  . Alcohol use: No  . Drug use: No    Family History  Problem Relation Age of Onset  . Migraines Mother   . Hypertension Father   . Colon cancer Neg Hx   . Stomach cancer Neg Hx     No Known Allergies  Medication list has been reviewed and updated.  Current Outpatient Medications on File Prior to Visit  Medication Sig Dispense Refill  . hyoscyamine (LEVSIN SL) 0.125 MG SL tablet Place 1 tablet (0.125 mg total) under the tongue every 4 (four) hours as needed. 30 tablet 0   No current facility-administered medications on file prior to visit.     ROS ROS otherwise unremarkable unless listed above.  Physical Examination: BP 114/73   Pulse 65   Temp 98.2 F (36.8 C) (Oral)   Resp 16   Ht 5'  2" (1.575 m)   Wt 130 lb (59 kg)   LMP 04/02/2017   SpO2 99%   BMI 23.78 kg/m  Ideal Body Weight: Weight in (lb) to have BMI = 25: 136.4  Physical Exam  Constitutional: She is oriented to person, place, and time. She appears well-developed and well-nourished. No distress.  HENT:  Head: Normocephalic and atraumatic.  Right Ear: Tympanic membrane, external ear and ear canal normal.  Left Ear: Tympanic membrane, external ear and ear canal normal.  Nose: No mucosal edema or rhinorrhea. Right sinus exhibits no maxillary sinus tenderness and no frontal sinus tenderness. Left sinus exhibits no maxillary sinus tenderness and no frontal sinus tenderness.  Mouth/Throat: No uvula swelling. No oropharyngeal exudate, posterior oropharyngeal edema or posterior oropharyngeal erythema.  Eyes: Pupils are equal, round, and reactive to light. Conjunctivae and EOM are normal.  Cardiovascular: Normal rate, regular  rhythm and normal pulses. Exam reveals no gallop, no distant heart sounds and no friction rub.  No murmur heard. Pulmonary/Chest: Effort normal. No accessory muscle usage. No apnea. No respiratory distress. She has no decreased breath sounds. She has no wheezes. She has no rhonchi.  Lymphadenopathy:       Head (right side): No submandibular, no tonsillar, no preauricular and no posterior auricular adenopathy present.       Head (left side): No submandibular, no tonsillar, no preauricular and no posterior auricular adenopathy present.    She has no cervical adenopathy.    She has no axillary adenopathy.  Neurological: She is alert and oriented to person, place, and time.  Skin: Capillary refill takes less than 2 seconds. She is not diaphoretic.  Psychiatric: She has a normal mood and affect. Her behavior is normal.    Results for orders placed or performed in visit on 04/12/17  POCT urinalysis dipstick  Result Value Ref Range   Color, UA yellow yellow   Clarity, UA clear clear   Glucose, UA negative negative mg/dL   Bilirubin, UA negative negative   Ketones, POC UA trace (5) (A) negative mg/dL   Spec Grav, UA 1.025 1.010 - 1.025   Blood, UA negative negative   pH, UA 5.5 5.0 - 8.0   Protein Ur, POC negative negative mg/dL   Urobilinogen, UA 0.2 0.2 or 1.0 E.U./dL   Nitrite, UA Negative Negative   Leukocytes, UA Negative Negative    Assessment and Plan: [name] is a 32 y.o. female who is here today for cc of  Chief Complaint  Patient presents with  . Back Pain    x 4 month  . Chest Pain    x 4 months/ pressure at times, sob  . Neck Pain    pt thinks it may be her thyriod  we will obtain the below lab work.  Advised to follow up in 1 week pending lab results.  Advised to follow up with one provider, or at least one group, so exclusions can be made of her diagnosis.  Will obtain pth for concern of hyperparathyroidism.  Other chest pain - Plan: EKG 12-Lead, CBC, TSH, PTH,  Intact and Calcium, Vitamin B12, Sedimentation Rate, POCT urine pregnancy, VITAMIN D 25 Hydroxy (Vit-D Deficiency, Fractures)  Hx of urinary infection - Plan: POCT urinalysis dipstick, CBC, TSH, PTH, Intact and Calcium, Vitamin B12, Sedimentation Rate, POCT urine pregnancy, VITAMIN D 25 Hydroxy (Vit-D Deficiency, Fractures)  Arthralgia, unspecified joint - Plan: CBC, TSH, PTH, Intact and Calcium, Vitamin B12, Sedimentation Rate, POCT urine pregnancy, VITAMIN D 25 Hydroxy (  Vit-D Deficiency, Fractures)  Ivar Drape, PA-C [facility] 4/10/201912:49 PM

## 2017-04-13 LAB — CBC
Hematocrit: 35.8 % (ref 34.0–46.6)
Hemoglobin: 12.3 g/dL (ref 11.1–15.9)
MCH: 31.4 pg (ref 26.6–33.0)
MCHC: 34.4 g/dL (ref 31.5–35.7)
MCV: 91 fL (ref 79–97)
PLATELETS: 261 10*3/uL (ref 150–379)
RBC: 3.92 x10E6/uL (ref 3.77–5.28)
RDW: 13.1 % (ref 12.3–15.4)
WBC: 7.8 10*3/uL (ref 3.4–10.8)

## 2017-04-13 LAB — SEDIMENTATION RATE: Sed Rate: 6 mm/hr (ref 0–32)

## 2017-04-13 LAB — PTH, INTACT AND CALCIUM
Calcium: 9.8 mg/dL (ref 8.7–10.2)
PTH: 36 pg/mL (ref 15–65)

## 2017-04-13 LAB — TSH: TSH: 1.33 u[IU]/mL (ref 0.450–4.500)

## 2017-04-13 LAB — VITAMIN B12: VITAMIN B 12: 389 pg/mL (ref 232–1245)

## 2017-04-13 LAB — VITAMIN D 25 HYDROXY (VIT D DEFICIENCY, FRACTURES): Vit D, 25-Hydroxy: 11.7 ng/mL — ABNORMAL LOW (ref 30.0–100.0)

## 2017-04-18 ENCOUNTER — Encounter: Payer: Self-pay | Admitting: Physician Assistant

## 2017-04-19 ENCOUNTER — Ambulatory Visit: Payer: Managed Care, Other (non HMO) | Admitting: Physician Assistant

## 2017-04-19 VITALS — BP 105/65 | HR 60 | Temp 98.2°F | Resp 16 | Ht 62.0 in | Wt 130.0 lb

## 2017-04-19 DIAGNOSIS — M791 Myalgia, unspecified site: Secondary | ICD-10-CM

## 2017-04-19 DIAGNOSIS — E559 Vitamin D deficiency, unspecified: Secondary | ICD-10-CM | POA: Diagnosis not present

## 2017-04-19 MED ORDER — DULOXETINE HCL 30 MG PO CPEP
30.0000 mg | ORAL_CAPSULE | Freq: Every day | ORAL | 1 refills | Status: DC
Start: 2017-04-19 — End: 2017-05-15

## 2017-04-19 MED ORDER — ERGOCALCIFEROL 1.25 MG (50000 UT) PO CAPS: 50000.0000 [IU] | ORAL_CAPSULE | ORAL | 0 refills | Status: DC

## 2017-04-19 NOTE — Patient Instructions (Addendum)
I would like you to take the athymil every other day for three days.  Then I would like you to go to every 3 days.  Do this for one week, then stop. At the same time, you will take the cymbalta once daily.   Please take the drisdol once per week.  You will then return in 6 weeks.   Duloxetine delayed-release capsules What is this medicine? DULOXETINE (doo LOX e teen) is used to treat depression, anxiety, and different types of chronic pain. This medicine may be used for other purposes; ask your health care provider or pharmacist if you have questions. COMMON BRAND NAME(S): Cymbalta, Irenka What should I tell my health care provider before I take this medicine? They need to know if you have any of these conditions: -bipolar disorder or a family history of bipolar disorder -glaucoma -kidney disease -liver disease -suicidal thoughts or a previous suicide attempt -taken medicines called MAOIs like Carbex, Eldepryl, Marplan, Nardil, and Parnate within 14 days -an unusual reaction to duloxetine, other medicines, foods, dyes, or preservatives -pregnant or trying to get pregnant -breast-feeding How should I use this medicine? Take this medicine by mouth with a glass of water. Follow the directions on the prescription label. Do not cut, crush or chew this medicine. You can take this medicine with or without food. Take your medicine at regular intervals. Do not take your medicine more often than directed. Do not stop taking this medicine suddenly except upon the advice of your doctor. Stopping this medicine too quickly may cause serious side effects or your condition may worsen. A special MedGuide will be given to you by the pharmacist with each prescription and refill. Be sure to read this information carefully each time. Talk to your pediatrician regarding the use of this medicine in children. While this drug may be prescribed for children as young as 43 years of age for selected conditions,  precautions do apply. Overdosage: If you think you have taken too much of this medicine contact a poison control center or emergency room at once. NOTE: This medicine is only for you. Do not share this medicine with others. What if I miss a dose? If you miss a dose, take it as soon as you can. If it is almost time for your next dose, take only that dose. Do not take double or extra doses. What may interact with this medicine? Do not take this medicine with any of the following medications: -desvenlafaxine -levomilnacipran -linezolid -MAOIs like Carbex, Eldepryl, Marplan, Nardil, and Parnate -methylene blue (injected into a vein) -milnacipran -thioridazine -venlafaxine This medicine may also interact with the following medications: -alcohol -amphetamines -aspirin and aspirin-like medicines -certain antibiotics like ciprofloxacin and enoxacin -certain medicines for blood pressure, heart disease, irregular heart beat -certain medicines for depression, anxiety, or psychotic disturbances -certain medicines for migraine headache like almotriptan, eletriptan, frovatriptan, naratriptan, rizatriptan, sumatriptan, zolmitriptan -certain medicines that treat or prevent blood clots like warfarin, enoxaparin, and dalteparin -cimetidine -fentanyl -lithium -NSAIDS, medicines for pain and inflammation, like ibuprofen or naproxen -phentermine -procarbazine -rasagiline -sibutramine -St. John's wort -theophylline -tramadol -tryptophan This list may not describe all possible interactions. Give your health care provider a list of all the medicines, herbs, non-prescription drugs, or dietary supplements you use. Also tell them if you smoke, drink alcohol, or use illegal drugs. Some items may interact with your medicine. What should I watch for while using this medicine? Tell your doctor if your symptoms do not get better or if they get  worse. Visit your doctor or health care professional for regular  checks on your progress. Because it may take several weeks to see the full effects of this medicine, it is important to continue your treatment as prescribed by your doctor. Patients and their families should watch out for new or worsening thoughts of suicide or depression. Also watch out for sudden changes in feelings such as feeling anxious, agitated, panicky, irritable, hostile, aggressive, impulsive, severely restless, overly excited and hyperactive, or not being able to sleep. If this happens, especially at the beginning of treatment or after a change in dose, call your health care professional. Dennis Bast may get drowsy or dizzy. Do not drive, use machinery, or do anything that needs mental alertness until you know how this medicine affects you. Do not stand or sit up quickly, especially if you are an older patient. This reduces the risk of dizzy or fainting spells. Alcohol may interfere with the effect of this medicine. Avoid alcoholic drinks. This medicine can cause an increase in blood pressure. This medicine can also cause a sudden drop in your blood pressure, which may make you feel faint and increase the chance of a fall. These effects are most common when you first start the medicine or when the dose is increased, or during use of other medicines that can cause a sudden drop in blood pressure. Check with your doctor for instructions on monitoring your blood pressure while taking this medicine. Your mouth may get dry. Chewing sugarless gum or sucking hard candy, and drinking plenty of water may help. Contact your doctor if the problem does not go away or is severe. What side effects may I notice from receiving this medicine? Side effects that you should report to your doctor or health care professional as soon as possible: -allergic reactions like skin rash, itching or hives, swelling of the face, lips, or tongue -anxious -breathing problems -confusion -changes in vision -chest  pain -confusion -elevated mood, decreased need for sleep, racing thoughts, impulsive behavior -eye pain -fast, irregular heartbeat -feeling faint or lightheaded, falls -feeling agitated, angry, or irritable -hallucination, loss of contact with reality -high blood pressure -loss of balance or coordination -palpitations -redness, blistering, peeling or loosening of the skin, including inside the mouth -restlessness, pacing, inability to keep still -seizures -stiff muscles -suicidal thoughts or other mood changes -trouble passing urine or change in the amount of urine -trouble sleeping -unusual bleeding or bruising -unusually weak or tired -vomiting -yellowing of the eyes or skin Side effects that usually do not require medical attention (report to your doctor or health care professional if they continue or are bothersome): -change in sex drive or performance -change in appetite or weight -constipation -dizziness -dry mouth -headache -increased sweating -nausea -tired This list may not describe all possible side effects. Call your doctor for medical advice about side effects. You may report side effects to FDA at 1-800-FDA-1088. Where should I keep my medicine? Keep out of the reach of children. Store at room temperature between 20 and 25 degrees C (68 to 77 degrees F). Throw away any unused medicine after the expiration date. NOTE: This sheet is a summary. It may not cover all possible information. If you have questions about this medicine, talk to your doctor, pharmacist, or health care provider.  2018 Elsevier/Gold Standard (2015-06-03 18:16:03)   IF you received an x-ray today, you will receive an invoice from [facility]. Please contact [facility] at [phone] with questions or concerns regarding your invoice.  IF you received labwork today, you will receive an invoice from [employer]. Please contact [facility] at [phone] with questions or concerns  regarding your invoice.   Our billing staff will not be able to assist you with questions regarding bills from these companies.  You will be contacted with the lab results as soon as they are available. The fastest way to get your results is to activate your My Chart account. Instructions are located on the last page of this paperwork. If you have not heard from [place] regarding the results in 2 weeks, please contact this office.

## 2017-04-19 NOTE — Progress Notes (Signed)
PRIMARY CARE AT [facility] [address], [place] [address] [phone]  Date:  04/19/2017   Name:  Makisha Marrin   DOB:  12-14-1985   MRN:  [id number]  PCP:  Patient, No Pcp Per    History of Present Illness:  Kaylyn Garrow is a 32 y.o. female patient who presents to PCP with  Chief Complaint  Patient presents with  . Chest Pain    follow up and lab follow up/ pt states she is still having chest pain     Patient is here for follow up of lab results.   She continues to take her [place] medications [place].   Symptoms continue, chronically as reported 1 week ago with symptoms abnormal hair growth, bumps on chest, joint pain, fatigue, muscle pain.   She is concerned that this is a hormone problem.  Sedimentation rate wnl b12 normal pth normal tsh wnl Abnormal lab revealed vitamin d abnormality.   Patient Active Problem List   Diagnosis Date Noted  . Hx of migraines 06/20/2011  . GERD (gastroesophageal reflux disease) 06/20/2011  . Anemia 06/20/2011    Past Medical History:  Diagnosis Date  . Abnormal Pap smear   . Acid reflux   . Anemia   . Decreased appetite 10/08  . Endometriosis 10/2004  . Epigastric pain 10/2006  . FH: migraines   . GBS carrier   . H/O amenorrhea 06/2006  . H/O dyspareunia 7/07  . H/O fatigue   . H/O nausea and vomiting 10/2006  . H/O rubella   . H/O varicella   . Hyperemesis arising during pregnancy    First pregnancy  . Irregular periods/menstrual cycles 02/2005  . Pelvic pain 09/2008    Past Surgical History:  Procedure Laterality Date  . WISDOM TOOTH EXTRACTION      Social History   Tobacco Use  . Smoking status: Never Smoker  . Smokeless tobacco: Never Used  Substance Use Topics  . Alcohol use: No  . Drug use: No    Family History  Problem Relation Age of Onset  . Migraines Mother   . Hypertension Father   . Colon cancer Neg Hx   . Stomach cancer Neg Hx     No Known Allergies  Medication  list has been reviewed and updated.  Current Outpatient Medications on File Prior to Visit  Medication Sig Dispense Refill  . PRESCRIPTION MEDICATION ALIVIAR    . PRESCRIPTION MEDICATION Athymil    . PRESCRIPTION MEDICATION Lysanxia     No current facility-administered medications on file prior to visit.     ROS ROS otherwise unremarkable unless listed above.  Physical Examination: BP 105/65   Pulse 60   Temp 98.2 F (36.8 C) (Oral)   Resp 16   Ht 5\' 2"  (1.575 m)   Wt 130 lb (59 kg)   LMP 04/02/2017   SpO2 98%   BMI 23.78 kg/m  Ideal Body Weight: Weight in (lb) to have BMI = 25: 136.4  Physical Exam  Constitutional: She is oriented to person, place, and time. She appears well-developed and well-nourished. No distress.  HENT:  Head: Normocephalic and atraumatic.  Right Ear: External ear normal.  Left Ear: External ear normal.  Eyes: Pupils are equal, round, and reactive to light. Conjunctivae and EOM are normal.  Cardiovascular: Normal rate.  Pulmonary/Chest: Effort normal. No respiratory distress.  Neurological: She is alert and oriented to person, place, and time.  Skin: She is not diaphoretic.  Psychiatric:  She has a normal mood and affect. Her behavior is normal.     Assessment and Plan: Charlee Squibb is a 32 y.o. female who is here for cc of  Chief Complaint  Patient presents with  . Chest Pain    follow up and lab follow up/ pt states she is still having chest pain   Advised to start vitamin D weekly Offered to attempt abx with rash, however she declines stating "that is not what this is" May need to consider catecholamines/etc.   She is declining a referral to a specialist at this time. Follow up in 7 weeks. Vitamin D deficiency - Plan: ergocalciferol (DRISDOL) 50000 units capsule  Muscle pain - Plan: DULoxetine (CYMBALTA) 30 MG capsule  Ivar Drape, PA-C [facility] 4/12/201910:44 PM

## 2017-04-24 ENCOUNTER — Encounter: Payer: Self-pay | Admitting: Physician Assistant

## 2017-04-26 ENCOUNTER — Telehealth: Payer: Self-pay

## 2017-04-26 NOTE — Telephone Encounter (Signed)
Phone call to patient's husband. Relayed message from [place], PA-C that office visit is needed, he is agreeable. Rachid transferred to front desk to schedule.

## 2017-04-26 NOTE — Telephone Encounter (Signed)
Copied from [place]. Topic: Quick Communication - See Telephone Encounter >> Apr 26, 2017  9:13 AM Antonieta Iba C wrote: CRM for notification. See Telephone encounter for: 04/26/17.  Pt's spouse called in to make provider aware that medication prescribed DULoxetine (CYMBALTA) 30 MG capsule isn't helping pt's pain. Spouse would like to know if provider could just order MRI to make sure that everything is okay?   Please assist further.

## 2017-04-26 NOTE — Telephone Encounter (Signed)
She would need to follow up with a provider. She does not need to follow up with me

## 2017-04-27 ENCOUNTER — Telehealth: Payer: Self-pay | Admitting: Physician Assistant

## 2017-04-27 NOTE — Telephone Encounter (Signed)
Please advise. Thanks.  

## 2017-04-27 NOTE — Telephone Encounter (Signed)
I responded to this previously. She needs to make an appointment. Establish care with a new provider and follow up. I do not know what to MRI there were multiple symptoms. She needs to follow up.

## 2017-04-27 NOTE — Telephone Encounter (Unsigned)
Copied from [place] [id number]. Topic: Quick Communication - See Telephone Encounter >> Apr 27, 2017 11:33 AM Neva Seat wrote: Pt is asking if she can have a referral for a MRI because all Rx's prescribed are not helping with the pain.   Pt wants to know what is causing the pain. Please call pt back asap to let her know if this has been done and status.

## 2017-04-30 ENCOUNTER — Telehealth: Payer: Self-pay | Admitting: Physician Assistant

## 2017-04-30 ENCOUNTER — Telehealth: Payer: Self-pay | Admitting: Family Medicine

## 2017-04-30 NOTE — Telephone Encounter (Signed)
Patient's appointment was rescheduled from Caitlyn Miller at 9:40 to Dr Pamella Pert at 11:40, per Ivar Drape. English says patient needs to establish care with [name] due to her departure.  Called and left message on patient's voice mail

## 2017-04-30 NOTE — Telephone Encounter (Signed)
Phone call to patient's husband, Rachid. Unable to reach. If Rachid or patient call back, please make an appointment with any provider to establish care and follow up on symptoms that are not improving. She needs to be re-evaluated in the office before further imaging is ordered.

## 2017-04-30 NOTE — Telephone Encounter (Signed)
Copied from [place] [id number]. Topic: Quick Communication - See Telephone Encounter >> Apr 30, 2017 11:20 AM Ether Griffins B wrote: CRM for notification. See Telephone encounter for: 04/30/17.  Pt's husband calling in requesting to speak with Armando Gang CMA. He is requesting an MRI be ordered due to the medication not working and her symptoms are no better. He states her whole body is hurting. CB# [phone].

## 2017-05-01 ENCOUNTER — Ambulatory Visit: Payer: Managed Care, Other (non HMO) | Admitting: Physician Assistant

## 2017-05-01 ENCOUNTER — Ambulatory Visit: Payer: Managed Care, Other (non HMO) | Admitting: Family Medicine

## 2017-05-01 ENCOUNTER — Other Ambulatory Visit: Payer: Self-pay

## 2017-05-01 ENCOUNTER — Encounter: Payer: Self-pay | Admitting: Family Medicine

## 2017-05-01 VITALS — BP 110/82 | HR 77 | Temp 98.2°F | Ht 63.0 in | Wt 127.2 lb

## 2017-05-01 DIAGNOSIS — M546 Pain in thoracic spine: Secondary | ICD-10-CM | POA: Diagnosis not present

## 2017-05-01 DIAGNOSIS — K219 Gastro-esophageal reflux disease without esophagitis: Secondary | ICD-10-CM

## 2017-05-01 DIAGNOSIS — L700 Acne vulgaris: Secondary | ICD-10-CM

## 2017-05-01 MED ORDER — CELECOXIB 100 MG PO CAPS: 100.0000 mg | ORAL_CAPSULE | Freq: Two times a day (BID) | ORAL | 1 refills | Status: DC

## 2017-05-01 MED ORDER — OMEPRAZOLE 20 MG PO CPDR
20.0000 mg | DELAYED_RELEASE_CAPSULE | Freq: Two times a day (BID) | ORAL | 3 refills | Status: DC
Start: 2017-05-01 — End: 2017-05-15

## 2017-05-01 MED ORDER — CYCLOBENZAPRINE HCL 10 MG PO TABS: 10.0000 mg | ORAL_TABLET | Freq: Three times a day (TID) | ORAL | 1 refills | Status: DC | PRN

## 2017-05-01 MED ORDER — CLINDAMYCIN PHOS-BENZOYL PEROX 1-5 % EX GEL: Freq: Two times a day (BID) | CUTANEOUS | 1 refills | Status: DC

## 2017-05-01 NOTE — Patient Instructions (Addendum)
1. Foods to avoid, caffeine, ginger, onions, garlic, citrus fruits, tomatoes, bananas    IF you received an x-ray today, you will receive an invoice from [facility]. Please contact [facility] at [phone] with questions or concerns regarding your invoice.   IF you received labwork today, you will receive an invoice from [place]. Please contact [facility] at [phone] with questions or concerns regarding your invoice.   Our billing staff will not be able to assist you with questions regarding bills from these companies.  You will be contacted with the lab results as soon as they are available. The fastest way to get your results is to activate your My Chart account. Instructions are located on the last page of this paperwork. If you have not heard from [place] regarding the results in 2 weeks, please contact this office.

## 2017-05-01 NOTE — Progress Notes (Signed)
4/16/201912:15 PM  Caitlyn Miller Oct 08, 1985, 32 y.o. female [id number]  Chief Complaint  Patient presents with  . Pain    Chest and back pain. Medication is causing the discomfort. Per PCP, needs to have MRI. Has done EKG and labs, all normal with past doctor. Refuses EKG for today    HPI:   Patient is a 33 y.o. female who presents today for 4-5 months of chest pain  Stabbing, constant, not made worse or better with breathing, movement or eating Radiates to mid back A/w difficulty swallowing, feels something is stuck in middle of her chest, SOB She also reports that pain will then radiate down her arms and legs Interfering with her ability to eat, has lost weight Sometimes a/w bad taste in her mouth, nausea, bloating, burping Denies any vomiting, diarrhea or constipation Still has GB Has never tried GERD medication H/o h pylori infection in 02/2016 Denies any changes in her mood  She is originally from [place] While back in country of origin, she saw pulm, cards, neuro and psych for these sx Reports normal workups.  She also reports that her PCP thought she might need spine MRI, but patient reports normal spine CT Psych tried several antidepressant thinking it might be mood related, none helped Athymil - TCA closely related to mirtazapine Lysanxia - prazepam, benzo Most recent one was cymbalta which she feels made sx worse. She would also like acne medication, mostly neck and chest   Depression screen [facility] 2/9 04/19/2017  Decreased Interest 0  Down, Depressed, Hopeless 0  PHQ - 2 Score 0    No Known Allergies  Prior to Admission medications   Medication Sig Start Date End Date Taking? Authorizing Provider  ergocalciferol (DRISDOL) 50000 units capsule Take 1 capsule (50,000 Units total) by mouth once a week. 04/19/17  Yes Joretta Bachelor, PA  PRESCRIPTION MEDICATION ALIVIAR    [provider]  PRESCRIPTION MEDICATION Athymil    [provider]    [place]    [provider]    Past Medical History:  Diagnosis Date  . Abnormal Pap smear   . Acid reflux   . Anemia   . Decreased appetite 10/08  . Endometriosis 10/2004  . Epigastric pain 10/2006  . FH: migraines   . GBS carrier   . H/O amenorrhea 06/2006  . H/O dyspareunia 7/07  . H/O fatigue   . H/O nausea and vomiting 10/2006  . H/O rubella   . H/O varicella   . Hyperemesis arising during pregnancy    First pregnancy  . Irregular periods/menstrual cycles 02/2005  . Pelvic pain 09/2008    Past Surgical History:  Procedure Laterality Date  . WISDOM TOOTH EXTRACTION      Social History   Tobacco Use  . Smoking status: Never Smoker  . Smokeless tobacco: Never Used  Substance Use Topics  . Alcohol use: No    Family History  Problem Relation Age of Onset  . Migraines Mother   . Hypertension Father   . Colon cancer Neg Hx   . Stomach cancer Neg Hx     ROS Per hpi  OBJECTIVE:  Blood pressure 110/82, pulse 77, temperature 98.2 F (36.8 C), temperature source Oral, height 5\' 3"  (1.6 m), weight 127 lb 3.2 oz (57.7 kg), last menstrual period 04/02/2017, SpO2 99 %.  Wt Readings from Last 3 Encounters:  05/01/17 127 lb 3.2 oz (57.7 kg)  04/19/17 130 lb (59 kg)  04/12/17 130 lb (  59 kg)    Physical Exam  Constitutional: She is oriented to person, place, and time. She appears well-developed and well-nourished.  HENT:  Head: Normocephalic and atraumatic.  Mouth/Throat: Oropharynx is clear and moist. No oropharyngeal exudate.  Eyes: Pupils are equal, round, and reactive to light. EOM are normal. No scleral icterus.  Neck: Full passive range of motion without pain. Neck supple. No spinous process tenderness and no muscular tenderness present. No thyromegaly present.  Cardiovascular: Normal rate, regular rhythm and normal heart sounds. Exam reveals no gallop and no friction rub.  No murmur heard. Pulmonary/Chest: Effort normal and  breath sounds normal. She has no wheezes. She has no rales.  Abdominal: Soft. Bowel sounds are normal. She exhibits no mass. There is no hepatosplenomegaly. There is tenderness (generalized but most prominent in epigastric area).  Musculoskeletal: She exhibits no edema.       Thoracic back: She exhibits tenderness (paraspinals and along medial aspect of scapula). She exhibits no bony tenderness.  Lymphadenopathy:    She has no cervical adenopathy.  Neurological: She is alert and oriented to person, place, and time. She has normal strength and normal reflexes. She displays no atrophy. No sensory deficit. Gait normal.  Negative tinel and phalen signs Negative spurling's test  Skin: Skin is warm and dry.  Neck and upper chest with scattered papular acne     ASSESSMENT and PLAN  1. Gastroesophageal reflux disease without esophagitis Patient reports very extensive workups in [place]. I have asked that she brings records. Chest pain sx suggestive of GERD with esophageal spasms. Will recheck again for h pylori and basic labs. Prescribing PPI. Discussed dietary changes. Consider GI referral for EGD.  - H. pylori breath test - CBC - Comprehensive metabolic panel - Lipase - omeprazole 20mg  BID  2. Bilateral thoracic back pain, unspecified chronicity Prescribing celebrex given GI concerns. Adding muscle relaxant. Patient reports normal CT spine and otherwise has normal neuro exam. Will defer further imagining at this point and start with conservative measures.  - celecoxib (CELEBREX) 100 MG capsule; Take 1 capsule (100 mg total) by mouth 2 (two) times daily. - cyclobenzaprine (FLEXERIL) 10 MG tablet; Take 1 tablet (10 mg total) by mouth 3 (three) times daily as needed for muscle spasms.  3. Acne vulgaris - clindamycin-benzoyl peroxide (BENZACLIN) gel; Apply topically 2 (two) times daily.  Return in about 2 weeks (around 05/15/2017).    Rutherford Guys, MD Primary Care at [place], [place] [address] Ph.  [phone] Fax [phone]

## 2017-05-02 LAB — CBC
Hematocrit: 39.5 % (ref 34.0–46.6)
Hemoglobin: 12.8 g/dL (ref 11.1–15.9)
MCH: 30.6 pg (ref 26.6–33.0)
MCHC: 32.4 g/dL (ref 31.5–35.7)
MCV: 95 fL (ref 79–97)
Platelets: 273 10*3/uL (ref 150–379)
RBC: 4.18 x10E6/uL (ref 3.77–5.28)
RDW: 12.9 % (ref 12.3–15.4)
WBC: 6.4 10*3/uL (ref 3.4–10.8)

## 2017-05-02 LAB — COMPREHENSIVE METABOLIC PANEL
ALT: 23 IU/L (ref 0–32)
AST: 20 IU/L (ref 0–40)
Albumin/Globulin Ratio: 1.7 (ref 1.2–2.2)
Albumin: 4.7 g/dL (ref 3.5–5.5)
Alkaline Phosphatase: 40 IU/L (ref 39–117)
BUN/Creatinine Ratio: 11 (ref 9–23)
BUN: 7 mg/dL (ref 6–20)
Bilirubin Total: 0.4 mg/dL (ref 0.0–1.2)
CO2: 23 mmol/L (ref 20–29)
Calcium: 9.8 mg/dL (ref 8.7–10.2)
Chloride: 104 mmol/L (ref 96–106)
Creatinine, Ser: 0.62 mg/dL (ref 0.57–1.00)
GFR calc Af Amer: 137 mL/min/{1.73_m2} (ref >59)
GFR calc non Af Amer: 119 mL/min/{1.73_m2} (ref >59)
Globulin, Total: 2.7 g/dL (ref 1.5–4.5)
Glucose: 82 mg/dL (ref 65–99)
Potassium: 4.5 mmol/L (ref 3.5–5.2)
Sodium: 142 mmol/L (ref 134–144)
Total Protein: 7.4 g/dL (ref 6.0–8.5)

## 2017-05-02 LAB — H. PYLORI BREATH TEST: H pylori Breath Test: NEGATIVE

## 2017-05-02 LAB — LIPASE: Lipase: 37 U/L (ref 14–72)

## 2017-05-08 ENCOUNTER — Encounter: Payer: Self-pay | Admitting: Family Medicine

## 2017-05-15 ENCOUNTER — Encounter: Payer: Self-pay | Admitting: Family Medicine

## 2017-05-15 ENCOUNTER — Ambulatory Visit: Payer: Managed Care, Other (non HMO) | Admitting: Family Medicine

## 2017-05-15 ENCOUNTER — Other Ambulatory Visit: Payer: Self-pay

## 2017-05-15 VITALS — BP 118/60 | HR 75 | Temp 98.6°F | Ht 63.0 in | Wt 131.0 lb

## 2017-05-15 DIAGNOSIS — K219 Gastro-esophageal reflux disease without esophagitis: Secondary | ICD-10-CM

## 2017-05-15 DIAGNOSIS — M546 Pain in thoracic spine: Secondary | ICD-10-CM

## 2017-05-15 MED ORDER — OMEPRAZOLE 20 MG PO CPDR: 20.0000 mg | DELAYED_RELEASE_CAPSULE | Freq: Two times a day (BID) | ORAL | 1 refills | Status: DC

## 2017-05-15 MED ORDER — CELECOXIB 100 MG PO CAPS: 100.0000 mg | ORAL_CAPSULE | Freq: Two times a day (BID) | ORAL | 1 refills | Status: DC

## 2017-05-15 MED ORDER — CYCLOBENZAPRINE HCL 10 MG PO TABS: 10.0000 mg | ORAL_TABLET | Freq: Three times a day (TID) | ORAL | 1 refills | Status: DC | PRN

## 2017-05-15 NOTE — Patient Instructions (Signed)
     IF you received an x-ray today, you will receive an invoice from [facility]. Please contact [facility] at [phone] with questions or concerns regarding your invoice.   IF you received labwork today, you will receive an invoice from [facility]. Please contact [facility] at [phone] with questions or concerns regarding your invoice.   Our billing staff will not be able to assist you with questions regarding bills from these companies.  You will be contacted with the lab results as soon as they are available. The fastest way to get your results is to activate your My Chart account. Instructions are located on the last page of this paperwork. If you have not heard from us regarding the results in 2 weeks, please contact this office.     

## 2017-05-15 NOTE — Progress Notes (Signed)
4/30/201912:17 PM  Peri Maris Jan 01, 1986, 32 y.o. female [id number]  Chief Complaint  Patient presents with  . Follow-up    Gastroesophageal reflux     HPI:   Patient is a 32 y.o. female who presents today for followup  Last visit she had been experiencing chest pain that was suggestive to me of GERD. She had an extensive workup that was benign in [place]. I started her on PPI, doing better. Chest pain less often and less intense.   She also states that celebrex and flexeril are helping somewhat her upper back pain. She reports having had ct spine in [place], I have asked that she brings her records for review, MRI. Spine surg referral might be needed.   She overall feels minimally to moderately better than our last visit  She will be leaving soon to [facility] for 2-3 month again.   Fall Risk  05/15/2017 05/01/2017 04/19/2017  Falls in the past year? No No No     Depression screen [facility] 2/9 05/15/2017 04/19/2017  Decreased Interest 0 0  Down, Depressed, Hopeless 0 0  PHQ - 2 Score 0 0    No Known Allergies  Prior to Admission medications   Medication Sig Start Date End Date Taking? Authorizing Provider  celecoxib (CELEBREX) 100 MG capsule Take 1 capsule (100 mg total) by mouth 2 (two) times daily. 05/01/17  Yes Rutherford Guys, MD  clindamycin-benzoyl peroxide [facility]) gel Apply topically 2 (two) times daily. 05/01/17  Yes Rutherford Guys, MD  cyclobenzaprine (FLEXERIL) 10 MG tablet Take 1 tablet (10 mg total) by mouth 3 (three) times daily as needed for muscle spasms. 05/01/17  Yes Rutherford Guys, MD  ergocalciferol (DRISDOL) 50000 units capsule Take 1 capsule (50,000 Units total) by mouth once a week. 04/19/17  Yes English, Colletta Maryland D, PA  omeprazole (PRILOSEC) 20 MG capsule Take 1 capsule (20 mg total) by mouth 2 (two) times daily before a meal. 05/01/17  Yes Rutherford Guys, MD  [place]   Yes [provider]    Past Medical History:    Diagnosis Date  . Abnormal Pap smear   . Acid reflux   . Anemia   . Decreased appetite 10/08  . Endometriosis 10/2004  . Epigastric pain 10/2006  . FH: migraines   . GBS carrier   . H/O amenorrhea 06/2006  . H/O dyspareunia 7/07  . H/O fatigue   . H/O nausea and vomiting 10/2006  . H/O rubella   . H/O varicella   . Hyperemesis arising during pregnancy    First pregnancy  . Irregular periods/menstrual cycles 02/2005  . Pelvic pain 09/2008    Past Surgical History:  Procedure Laterality Date  . WISDOM TOOTH EXTRACTION      Social History   Tobacco Use  . Smoking status: Never Smoker  . Smokeless tobacco: Never Used  Substance Use Topics  . Alcohol use: No    Family History  Problem Relation Age of Onset  . Migraines Mother   . Hypertension Father   . Colon cancer Neg Hx   . Stomach cancer Neg Hx     Review of Systems  Gastrointestinal: Positive for heartburn and nausea.  Musculoskeletal: Positive for back pain and neck pain.  Neurological: Positive for tingling and headaches.  Psychiatric/Behavioral: Negative for depression. The patient is not nervous/anxious.    Per hpi  OBJECTIVE:  Blood pressure 118/60, pulse 75, temperature 98.6 F (37 C), temperature source Oral, height 5'  3" (1.6 m), weight 131 lb (59.4 kg), last menstrual period 05/01/2017, SpO2 98 %.  Physical Exam  Constitutional: She is oriented to person, place, and time.  HENT:  Head: Normocephalic and atraumatic.  Mouth/Throat: Mucous membranes are normal.  Eyes: Pupils are equal, round, and reactive to light. EOM are normal. No scleral icterus.  Neck: Neck supple.  Pulmonary/Chest: Effort normal.  Neurological: She is alert and oriented to person, place, and time.  Skin: Skin is warm and dry.  Nursing note and vitals reviewed.    ASSESSMENT and PLAN  1. Gastroesophageal reflux disease without esophagitis 2. Bilateral thoracic back pain, unspecified chronicity Both conditions with  some improvement, will cont current regime for now. Pending review of workup done in [place]. Will followup wtth her either before she leaves or when returns, pending time needed to review records as they will have to be translated.  Other orders - celecoxib (CELEBREX) 100 MG capsule; Take 1 capsule (100 mg total) by mouth 2 (two) times daily. - cyclobenzaprine (FLEXERIL) 10 MG tablet; Take 1 tablet (10 mg total) by mouth 3 (three) times daily as needed for muscle spasms. - omeprazole (PRILOSEC) 20 MG capsule; Take 1 capsule (20 mg total) by mouth 2 (two) times daily before a meal.  Return for pending review of records.    Rutherford Guys, MD [facility], [place] [address] Ph.  [phone] Fax [phone]

## 2017-05-16 ENCOUNTER — Other Ambulatory Visit: Payer: Self-pay | Admitting: Physician Assistant

## 2017-05-16 DIAGNOSIS — E559 Vitamin D deficiency, unspecified: Secondary | ICD-10-CM

## 2017-06-04 ENCOUNTER — Other Ambulatory Visit: Payer: Self-pay | Admitting: Internal Medicine

## 2017-06-04 DIAGNOSIS — R1013 Epigastric pain: Secondary | ICD-10-CM

## 2017-06-05 ENCOUNTER — Ambulatory Visit
Admission: RE | Admit: 2017-06-05 | Discharge: 2017-06-05 | Disposition: A | Discharge status: Home / Self Care | Payer: Managed Care, Other (non HMO) | Source: Ambulatory Visit | Attending: Internal Medicine | Admitting: Internal Medicine

## 2017-06-05 DIAGNOSIS — R1013 Epigastric pain: Secondary | ICD-10-CM

## 2017-06-05 IMAGING — RF DG UGI W/ HIGH DENSITY W/KUB
6 series · 14 of 22 positions shown · non-contrast
Comparison: Ultrasound the abdomen of [DATE]

CLINICAL DATA: Epigastric pain

EXAM:
UPPER GI SERIES WITH KUB
TECHNIQUE: After obtaining a scout radiograph a routine upper GI series was
performed using thin and high density barium.
FLUOROSCOPY TIME:  Fluoroscopy Time:  1 minutes 24 seconds
Radiation Exposure Index (if provided by the fluoroscopic device):
70 mGy
Number of Acquired Spot Images: 0

[Series 1: one shot · 1 of 1 slices shown (1 of 3)]
[im 1/1]
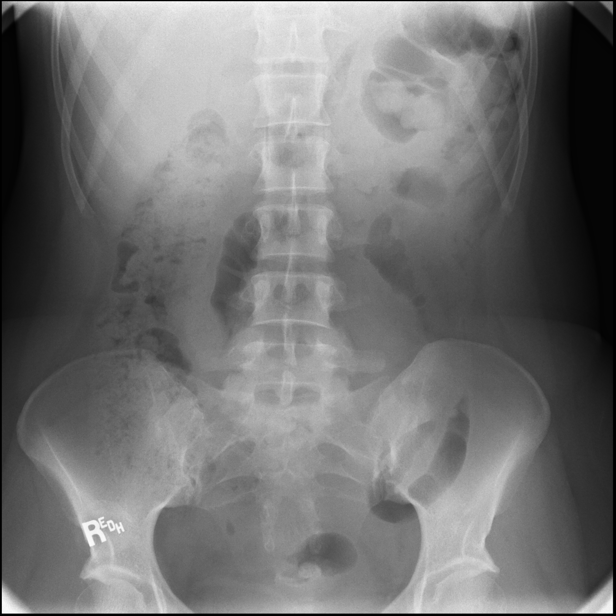

[Series 2: sequence · 2 of 7 frames shown (1 of 3)]
[frame 4/7]
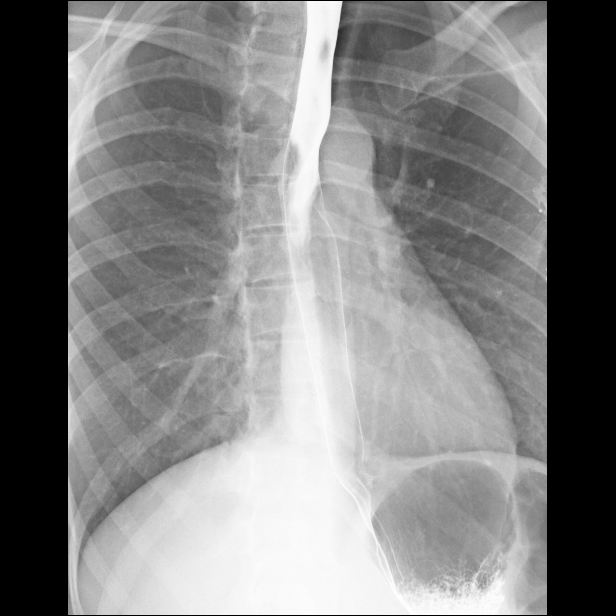
[frame 6/7]
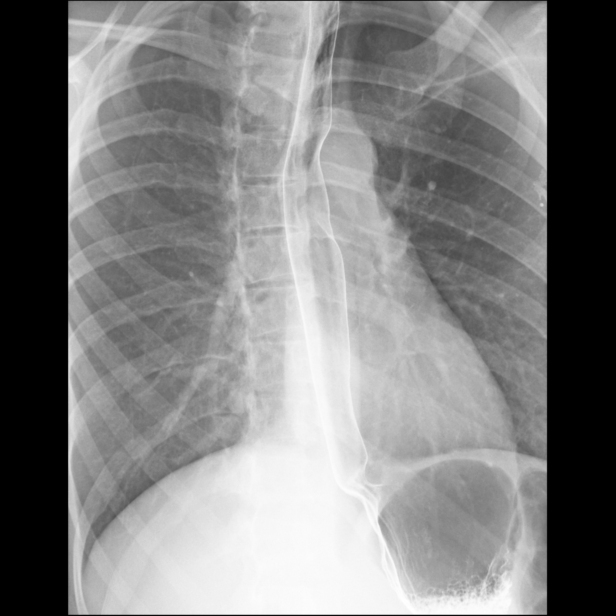

[Series 3: one shot · 3 of 4 slices shown (2 of 3)]
[im 1/4]
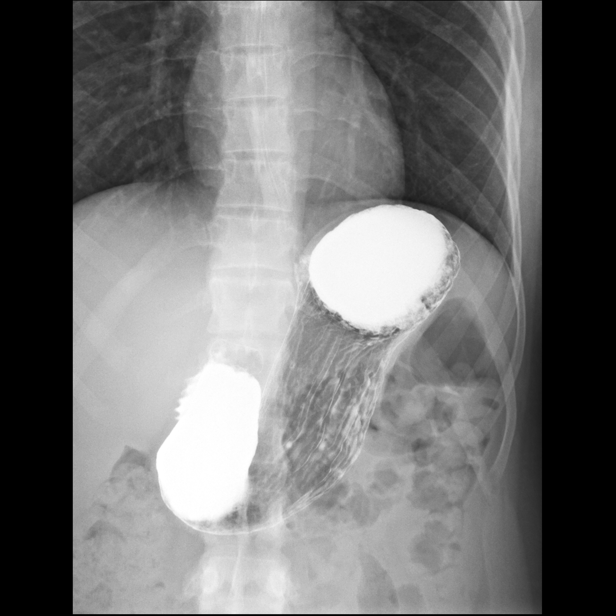
[im 3/4]
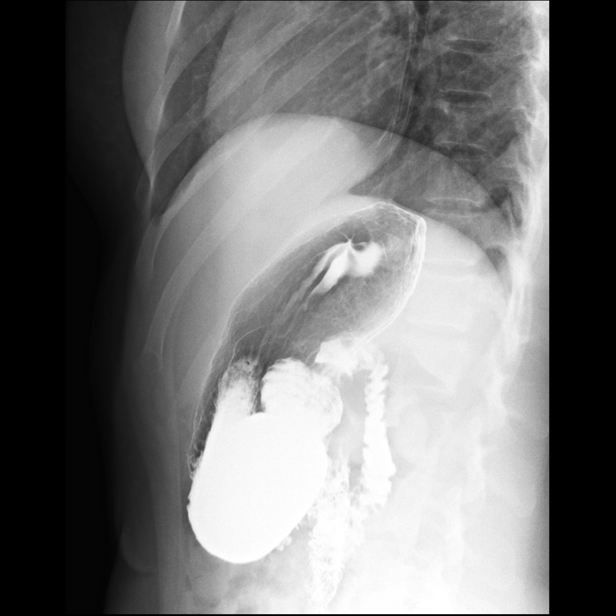
[im 4/4]
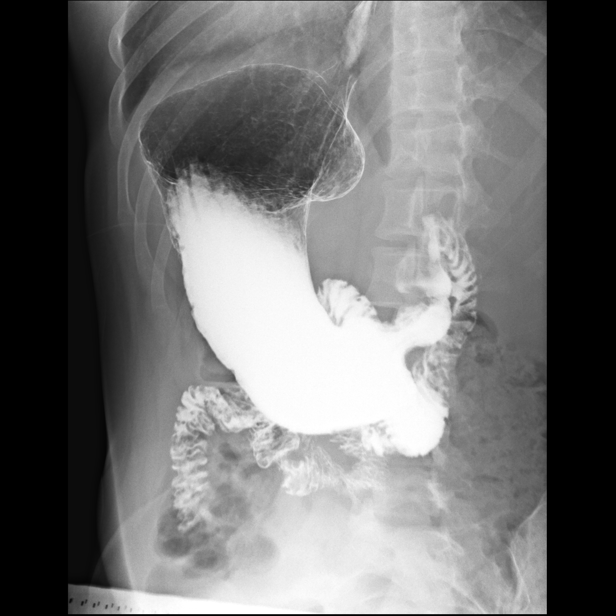

[Series 4: sequence · 2 of 18 frames shown (2 of 3)]
[frame 10/18]
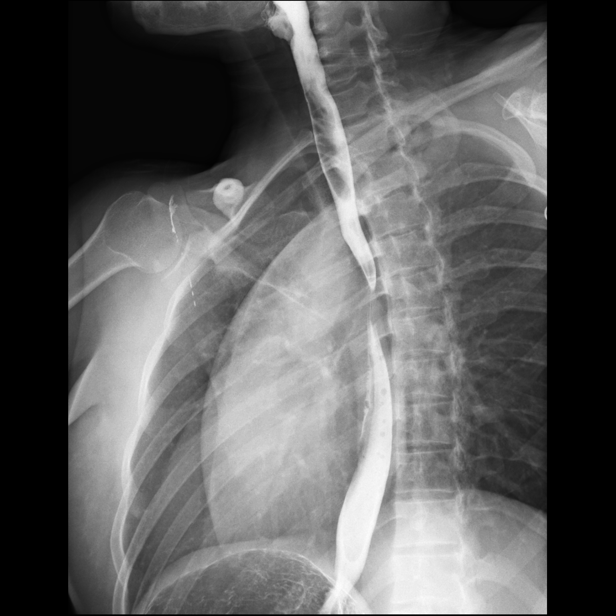
[frame 16/18]
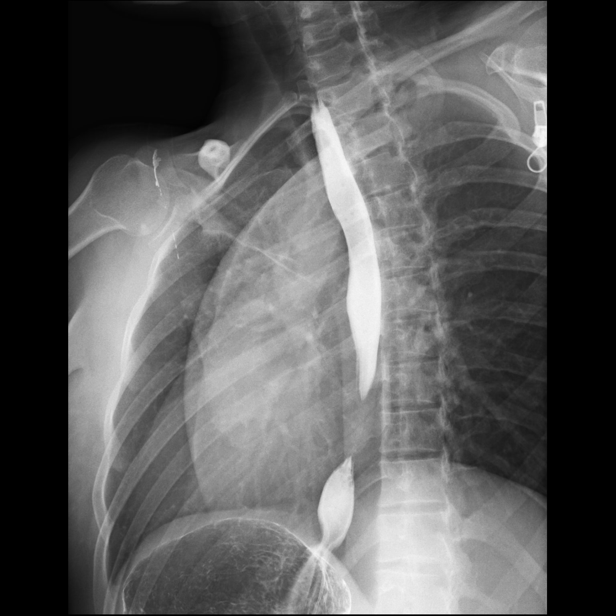

[Series 5: sequence · 3 of 44 frames shown (3 of 3)]
[frame 7/44]
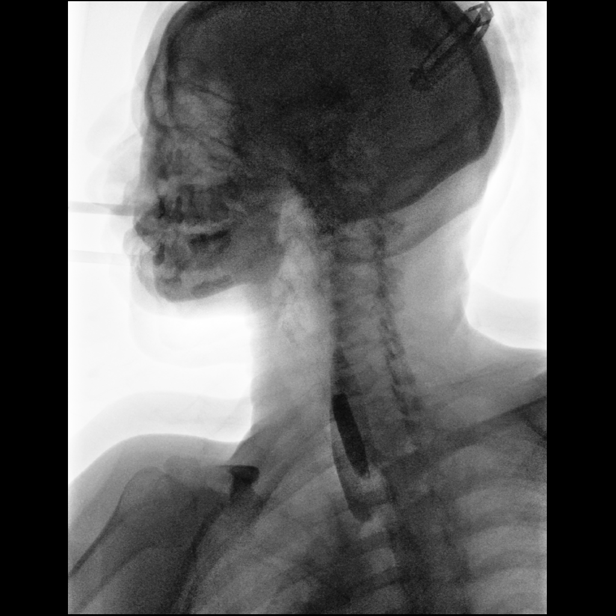
[frame 15/44]
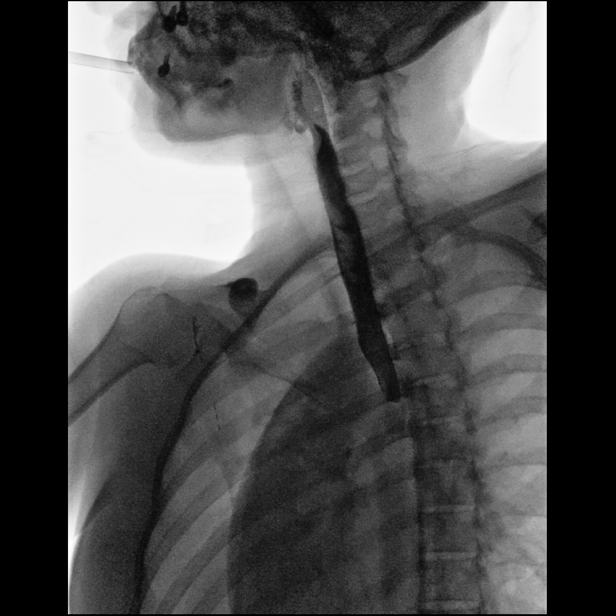
[frame 38/44]
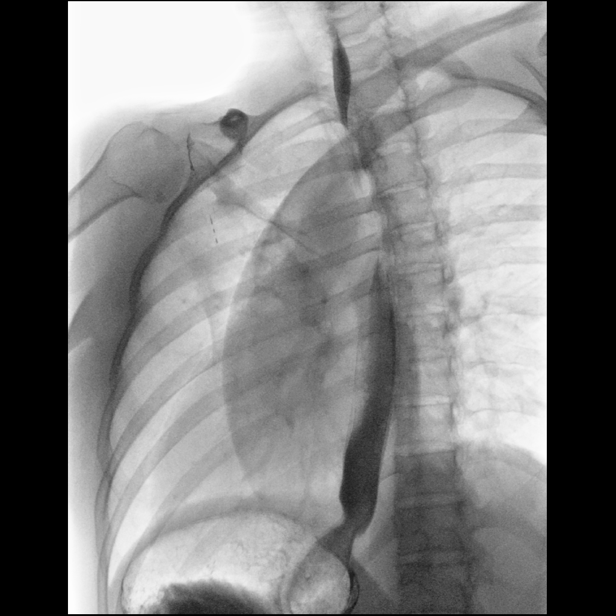

[Series 6: one shot · 3 of 5 slices shown (3 of 3)]
[im 2/5]
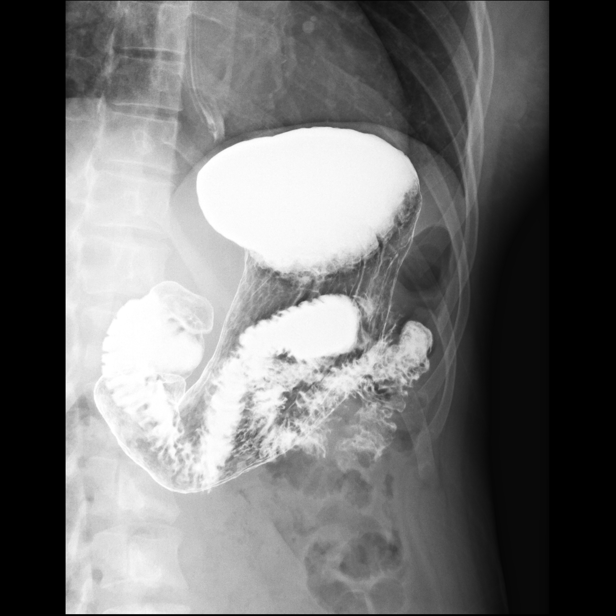
[im 3/5]
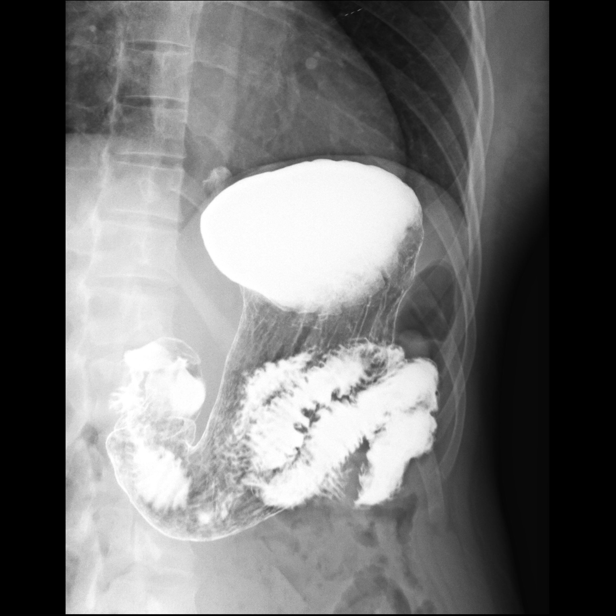
[im 5/5]
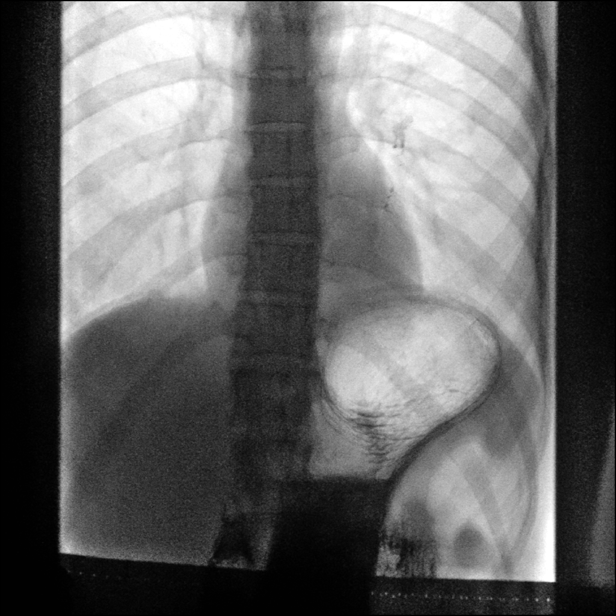

[14 of 22 positions shown; findings below may reference images not displayed]

FINDINGS: A preliminary film of the abdomen shows a nonspecific bowel gas
pattern. No opaque calculi are seen. The bones are unremarkable.

A double-contrast upper GI was performed. The mucosa of the
esophagus is well visualized with no abnormality noted. Single
contrast study shows the swallowing mechanism to be normal.
Esophageal peristalsis is normal. No hiatal hernia is seen. However,
there is significant gastroesophageal reflux demonstrated with the
water siphon maneuver. A barium pill was given at the end of the
study which passed into the stomach without delay.

The stomach is normal in contour and peristalsis. The duodenal bulb
fills and the duodenal loop is in normal position.
IMPRESSION: 1. Significant gastroesophageal reflux. No hiatal hernia. Barium
pill passed into the stomach without delay.
2. No abnormality of the stomach or duodenum is seen.

## 2017-08-22 ENCOUNTER — Other Ambulatory Visit: Payer: Self-pay | Admitting: Family Medicine

## 2017-08-22 DIAGNOSIS — E559 Vitamin D deficiency, unspecified: Secondary | ICD-10-CM

## 2017-10-06 ENCOUNTER — Emergency Department (HOSPITAL_COMMUNITY): Payer: Managed Care, Other (non HMO)

## 2017-10-06 ENCOUNTER — Emergency Department (HOSPITAL_COMMUNITY)
Admission: EM | Admit: 2017-10-06 | Discharge: 2017-10-06 | Disposition: A | Discharge status: Home / Self Care | Payer: Managed Care, Other (non HMO) | Attending: Emergency Medicine | Admitting: Emergency Medicine

## 2017-10-06 ENCOUNTER — Other Ambulatory Visit: Payer: Self-pay

## 2017-10-06 ENCOUNTER — Encounter (HOSPITAL_COMMUNITY): Payer: Self-pay | Admitting: Emergency Medicine

## 2017-10-06 DIAGNOSIS — Y9389 Activity, other specified: Secondary | ICD-10-CM | POA: Insufficient documentation

## 2017-10-06 DIAGNOSIS — S199XXA Unspecified injury of neck, initial encounter: Secondary | ICD-10-CM | POA: Diagnosis present

## 2017-10-06 DIAGNOSIS — Y999 Unspecified external cause status: Secondary | ICD-10-CM | POA: Insufficient documentation

## 2017-10-06 DIAGNOSIS — S161XXA Strain of muscle, fascia and tendon at neck level, initial encounter: Secondary | ICD-10-CM

## 2017-10-06 DIAGNOSIS — Z79899 Other long term (current) drug therapy: Secondary | ICD-10-CM | POA: Diagnosis not present

## 2017-10-06 DIAGNOSIS — Y92410 Unspecified street and highway as the place of occurrence of the external cause: Secondary | ICD-10-CM | POA: Diagnosis not present
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
## 2017-10-06 MED ORDER — MELOXICAM 7.5 MG PO TABS: 7.5000 mg | ORAL_TABLET | Freq: Every day | ORAL | 0 refills | Status: AC
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
## 2017-10-06 NOTE — Discharge Instructions (Addendum)
Take your muscle relaxer as prescribed.  You can also take the meloxicam that was prescribed and sent to your pharmacy today for your muscle pain. Apply warm compresses to sore muscles for 20 minutes at a time. Check with your doctor if not improving.
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
## 2017-10-10 ENCOUNTER — Other Ambulatory Visit: Payer: Self-pay | Admitting: Family Medicine

## 2017-10-10 DIAGNOSIS — E559 Vitamin D deficiency, unspecified: Secondary | ICD-10-CM

## 2017-11-08 NOTE — Telephone Encounter (Signed)
done

## 2018-01-16 HISTORY — PX: AUGMENTATION MAMMAPLASTY: SUR837

## 2018-02-08 ENCOUNTER — Encounter: Payer: Self-pay | Admitting: Physician Assistant

## 2018-02-08 ENCOUNTER — Ambulatory Visit: Payer: 59 | Admitting: Physician Assistant

## 2018-02-08 VITALS — BP 114/72 | HR 76 | Ht 62.5 in | Wt 125.4 lb

## 2018-02-08 DIAGNOSIS — R1013 Epigastric pain: Secondary | ICD-10-CM | POA: Diagnosis not present

## 2018-02-08 DIAGNOSIS — K219 Gastro-esophageal reflux disease without esophagitis: Secondary | ICD-10-CM

## 2018-02-08 NOTE — Patient Instructions (Signed)
If you are age 35 or older, your body mass index should be between 23-30. Your Body mass index is 22.57 kg/m. If this is out of the aforementioned range listed, please consider follow up with your Primary Care Provider.  If you are age 65 or younger, your body mass index should be between 19-25. Your Body mass index is 22.57 kg/m. If this is out of the aformentioned range listed, please consider follow up with your Primary Care Provider.   You have been scheduled for an endoscopy. Please follow written instructions given to you at your visit today. If you use inhalers (even only as needed), please bring them with you on the day of your procedure. Your physician has requested that you go to www.startemmi.com and enter the access code given to you at your visit today. This web site gives a general overview about your procedure. However, you should still follow specific instructions given to you by our office regarding your preparation for the procedure.  We are requesting records from your primary care provider.  Thank you for choosing me and [facility].    Ellouise Newer, PA-C

## 2018-02-08 NOTE — Progress Notes (Signed)
Agree with assessment and plan per PA Lemmon. Based on the patient's symptomatology it is reasonable as she is not feeling improvement with pantoprazole to attempt a diagnostic endoscopy with esophageal and gastric biopsies to rule out recurrence of H. pylori. If there is evidence of peptic ulcer disease or significant esophagitis she will need to be transitioned to another PPI with the possible addition of Carafate as well. If the patient continues to have significant discomfort and no evidence of significant ulcer disease she will require additional imaging as well as consideration of an abdominal ultrasound or CT abdomen/pelvis. We will see patient for upcoming endoscopy.

## 2018-02-08 NOTE — Progress Notes (Addendum)
Chief Complaint: Epigastric pain, nausea, vomiting, GERD  HPI:    Caitlyn Miller is a 33 year old [name] female with a past medical history as listed below, who presents to clinic today as a new patient with a complaint of epigastric pain, nausea, vomiting and reflux.    Today, the patient explains that she has had reflux for years, at least 6 years.  She did have an EGD originally in [place] 6 years ago which was positive for H. pylori and took medicine and made it "gone".  Over the past 2 years the patient has been maintained on a PPI, it sounds like most recently Pantoprazole 40 mg twice daily which was "not helping at all".  Patient tells me when on this medication she has side effects including skin rashes and other things.  She stopped all of her medicines about 2 weeks ago due to side effects and since then has had worsened epigastric pain and burning with reflux as well as occasional nausea and vomiting.  The epigastric pain is rated as a 7-8/10 and radiates through to her back at times.  She has lost almost 10 pounds in 2 weeks.  Has altered her diet trying to assist with symptoms and has tried everything over-the-counter as well as homeopathic with no help.  Patient is at the point where she is asking about surgery as she does not want to be on medications anymore.    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    Denies fever, chills, anorexia or melena.  Past Medical History:  Diagnosis Date  . Abnormal Pap smear   . Acid reflux   . Anemia   . Decreased appetite 10/08  . Endometriosis 10/2004  . Epigastric pain 10/2006  . FH: migraines   . GBS carrier   . H/O amenorrhea 06/2006  . H/O dyspareunia 7/07  . H/O fatigue   . H/O nausea and vomiting 10/2006  . H/O rubella   . H/O varicella   . Hyperemesis arising during pregnancy    First pregnancy  . Irregular periods/menstrual cycles 02/2005  .  Pelvic pain 09/2008    Past Surgical History:  Procedure Laterality Date  . WISDOM TOOTH EXTRACTION      No current outpatient medications on file.   No current facility-administered medications for this visit.     Allergies as of 02/08/2018 - Review Complete 02/08/2018  Allergen Reaction Noted  . Ibuprofen Nausea And Vomiting 10/06/2017    Family History  Problem Relation Age of Onset  . Migraines Mother   . Hypertension Father   . Colon cancer Neg Hx   . Stomach cancer Neg Hx     Social History   Socioeconomic History  . Marital status: Married    Spouse name: Not on file  . Number of children: Not on file  . Years of education: Not on file  . Highest education level: Not on file  Occupational History  . Not on file  Social Needs  . Financial resource strain: Not on file  . Food insecurity:    Worry: Not on file    Inability: Not on file  . Transportation needs:    Medical: Not on file    Non-medical: Not on file  Tobacco Use  . Smoking status: Never Smoker  . Smokeless tobacco: Never Used  Substance and Sexual Activity  . Alcohol use: No  . Drug  use: No  . Sexual activity: Yes    Birth control/protection: None  Lifestyle  . Physical activity:    Days per week: Not on file    Minutes per session: Not on file  . Stress: Not on file  Relationships  . Social connections:    Talks on phone: Not on file    Gets together: Not on file    Attends religious service: Not on file    Active member of club or organization: Not on file    Attends meetings of clubs or organizations: Not on file    Relationship status: Not on file  . Intimate partner violence:    Fear of current or ex partner: Not on file    Emotionally abused: Not on file    Physically abused: Not on file    Forced sexual activity: Not on file  Other Topics Concern  . Not on file  Social History Narrative  . Not on file    Review of Systems:    Constitutional: No weight loss, fever or  chills Skin: No rash  Cardiovascular: No chest pain Respiratory: No SOB  Gastrointestinal: See HPI and otherwise negative Genitourinary: No dysuria  Neurological: No headache, dizziness or syncope Musculoskeletal: No new muscle or joint pain Hematologic: No bleeding  Psychiatric: No history of depression or anxiety   Physical Exam:  Vital signs: BP 114/72   Pulse 76   Ht 5' 2.5" (1.588 m)   Wt 125 lb 6 oz (56.9 kg)   BMI 22.57 kg/m   Constitutional:   Pleasant [name] female appears to be in NAD, Well developed, Well nourished, alert and cooperative Head:  Normocephalic and atraumatic. Eyes:   PEERL, EOMI. No icterus. Conjunctiva pink. Ears:  Normal auditory acuity. Neck:  Supple Throat: Oral cavity and pharynx without inflammation, swelling or lesion.  Respiratory: Respirations even and unlabored. Lungs clear to auscultation bilaterally.   No wheezes, crackles, or rhonchi.  Cardiovascular: Normal S1, S2. No MRG. Regular rate and rhythm. No peripheral edema, cyanosis or pallor.  Gastrointestinal:  Soft, nondistended, marked ttp in epigastrum with involuntary guarding, Normal bowel sounds. No appreciable masses or hepatomegaly. Rectal:  Not performed.  Msk:  Symmetrical without gross deformities. Without edema, no deformity or joint abnormality.  Neurologic:  Alert and  oriented x4;  grossly normal neurologically.  Skin:   Dry and intact without significant lesions or rashes. Psychiatric: Demonstrates good judgement and reason without abnormal affect or behaviors.  Requesting recent labs.  Assessment: 1.  Epigastric pain: With reflux, does radiate through to her back; consider gastritis +/- H. Pylori or PUD 2.  Dyspepsia: Nausea and vomiting on an almost daily basis 3.  GERD: Uncontrolled even on a twice daily PPI for the past 2 years, previous diagnosis of H. pylori in [place] 6 years ago  Plan: 1.  Patient wishes to not be on medication anymore.  She tells me twice daily  dosing of Pantoprazole 40mg  was not helping so she stopped this 2 weeks ago, was also experiencing some side effects including some various skin rashes.  She asked today about surgery for reflux. 2.  Scheduled patient for an EGD in the [place] with Dr. Rush Landmark.  Did discuss risks, benefits, limitations and alternatives and the patient agrees to proceed. 3.  Briefly discussed surgical possibilities pending results of EGD and other testing including TIF procedure 4.  Patient wishes to remain off of medications until after time of her EGD. 5.  Patient will follow  in clinic per recommendations of Dr. Rush Landmark after time of procedure.  Ellouise Newer, PA-C [facility] 02/08/2018, 9:33 AM  Addendum: 02/08/18  05/01/2017 office visit with PCP.  At that time described stabbing, constant pain in her epigastrium which radiated through to her back associated with difficulty swallowing, at that time described never having tried reflux medication described a diagnosis of H. pylori infection 2018 per their notes while patient was in [place] she saw palm, cards, neuro and psych for the symptoms and reported normal work-ups.  Apparently psych tried several antidepressants thinking might be mood related but none helped CBC and CMP at that time normal.  Lipase normal, H. pylori breath test negative.  Patient explained to me that she had been seen within the past 6 months, will try to get these records as well.  Ellouise Newer, PA-C

## 2018-02-12 ENCOUNTER — Ambulatory Visit (INDEPENDENT_AMBULATORY_CARE_PROVIDER_SITE_OTHER)
Admission: RE | Admit: 2018-02-12 | Discharge: 2018-02-12 | Disposition: A | Discharge status: Home / Self Care | Payer: 59 | Source: Ambulatory Visit | Attending: Gastroenterology | Admitting: Gastroenterology

## 2018-02-12 ENCOUNTER — Other Ambulatory Visit: Payer: Self-pay | Admitting: Gastroenterology

## 2018-02-12 ENCOUNTER — Encounter: Payer: Self-pay | Admitting: Gastroenterology

## 2018-02-12 ENCOUNTER — Ambulatory Visit (AMBULATORY_SURGERY_CENTER): Payer: 59 | Admitting: Gastroenterology

## 2018-02-12 VITALS — BP 112/72 | HR 76 | Temp 96.8°F | Resp 14 | Ht 62.0 in | Wt 125.0 lb

## 2018-02-12 DIAGNOSIS — R0789 Other chest pain: Secondary | ICD-10-CM | POA: Diagnosis not present

## 2018-02-12 DIAGNOSIS — K3189 Other diseases of stomach and duodenum: Secondary | ICD-10-CM

## 2018-02-12 DIAGNOSIS — K2970 Gastritis, unspecified, without bleeding: Secondary | ICD-10-CM

## 2018-02-12 DIAGNOSIS — R1013 Epigastric pain: Secondary | ICD-10-CM

## 2018-02-12 DIAGNOSIS — K219 Gastro-esophageal reflux disease without esophagitis: Secondary | ICD-10-CM | POA: Diagnosis not present

## 2018-02-12 IMAGING — DX DG CHEST 2V
2 series · 2 of 2 positions shown · non-contrast
Comparison: [DATE]

CLINICAL DATA: Chest pain.

EXAM:
CHEST - 2 VIEW

[chest pa]
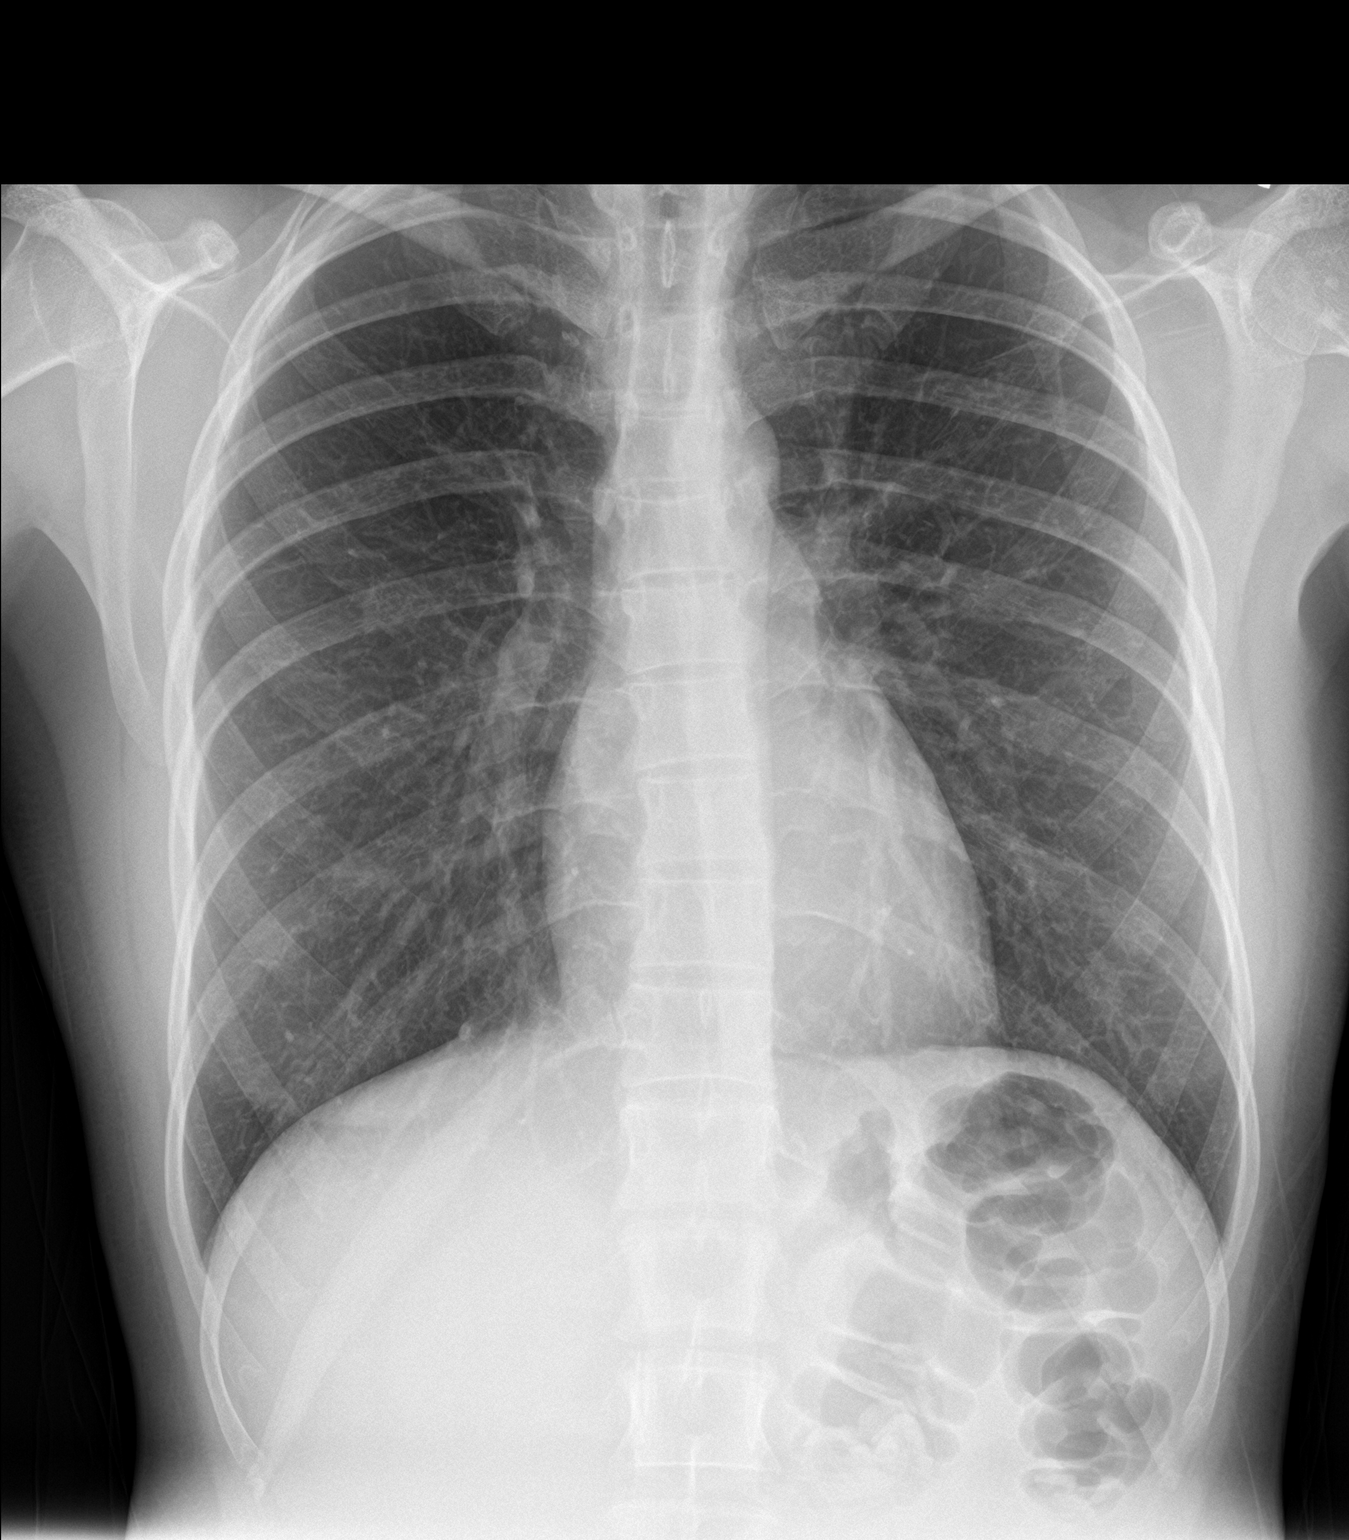

[chest lat]
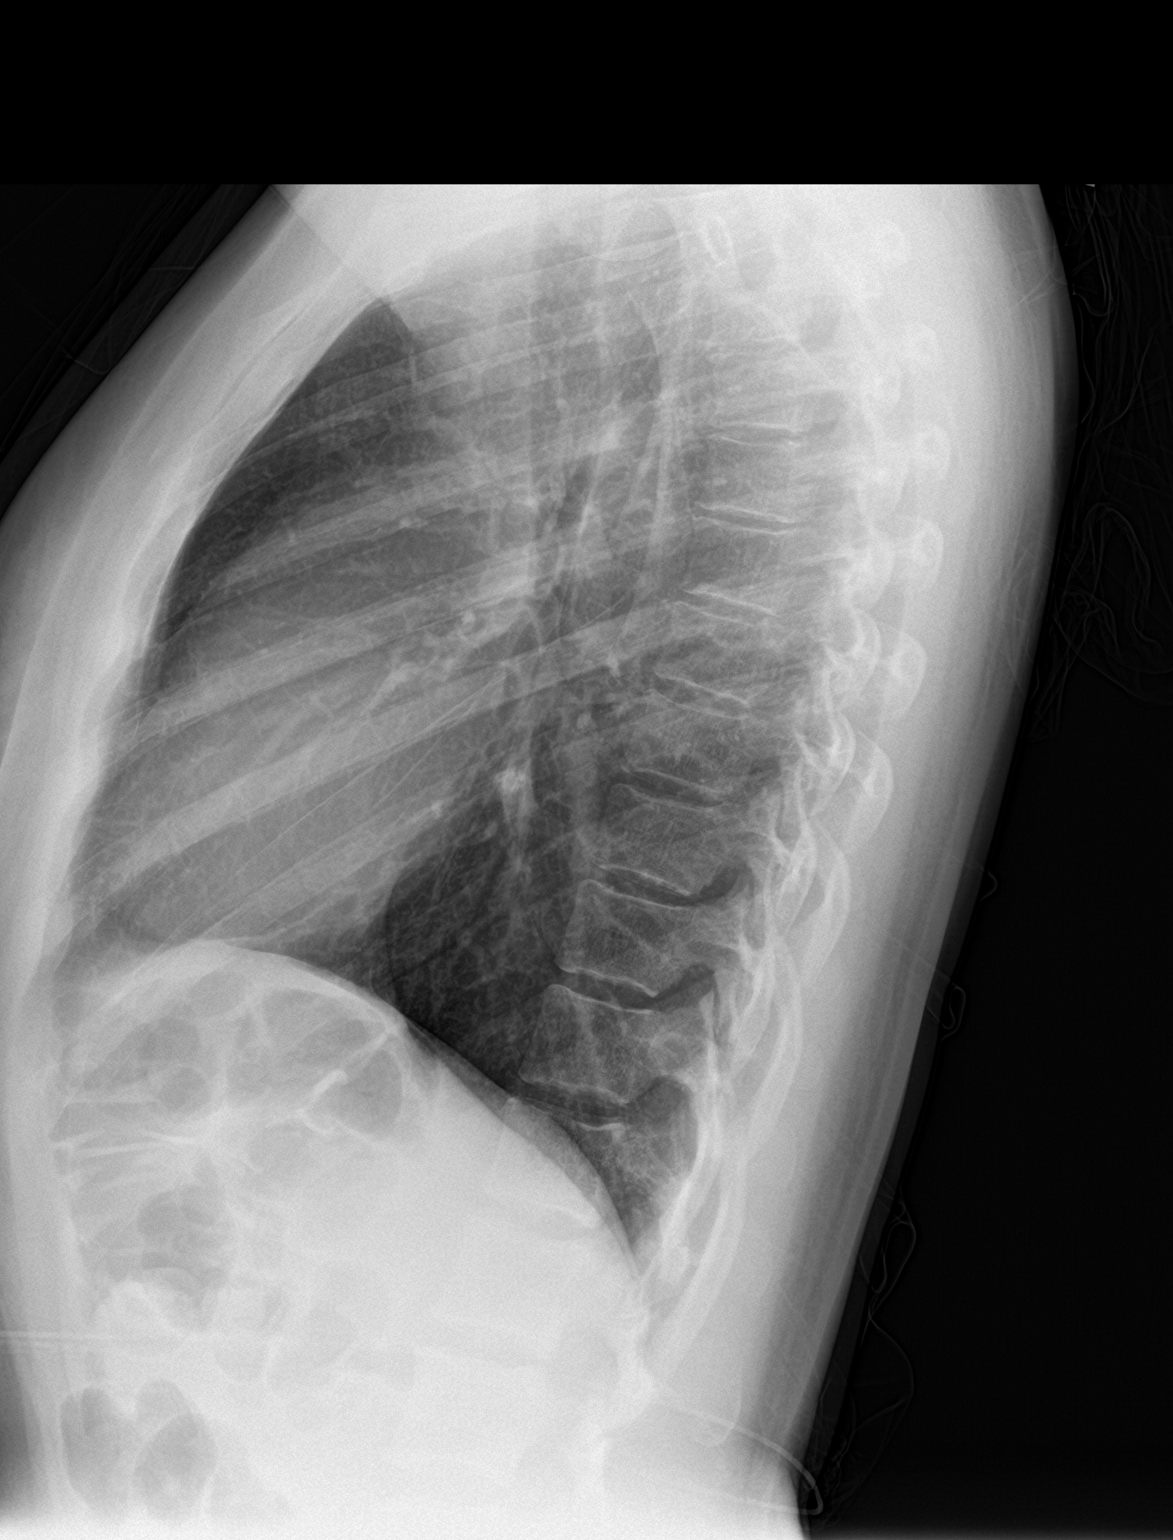

[2 of 2 positions shown; findings below may reference images not displayed]

FINDINGS: The heart size and mediastinal contours are within normal limits.
Both lungs are clear. The visualized skeletal structures are
unremarkable.
IMPRESSION: Normal exam.

## 2018-02-12 MED ORDER — SODIUM CHLORIDE 0.9 % IV SOLN: 500.0000 mL | Freq: Once | INTRAVENOUS | Status: DC

## 2018-02-12 MED ORDER — DEXLANSOPRAZOLE 60 MG PO CPDR: 60.0000 mg | DELAYED_RELEASE_CAPSULE | Freq: Every day | ORAL | 1 refills | Status: DC

## 2018-02-12 NOTE — Progress Notes (Signed)
Pt. Waking up. Grimacing, states she has knife like pain in sternal area.  Rates "7".  Dr. Rush Landmark advised by Arman Bogus RN, room float nurse

## 2018-02-12 NOTE — Op Note (Signed)
[facility] Patient Name: Caitlyn Miller Procedure Date: 02/12/2018 9:21 AM MRN: [id number] Endoscopist: Justice Britain , MD Age: 33 Referring MD:  Date of Birth: 06/27/1985 Gender: Female Account #: [id number] Procedure:                Upper GI endoscopy Indications:              Epigastric abdominal pain, Heartburn, Suspected                            gastro-esophageal reflux disease Medicines:                Monitored Anesthesia Care Procedure:                Pre-Anesthesia Assessment:                           - Prior to the procedure, a History and Physical                            was performed, and patient medications and                            allergies were reviewed. The patient's tolerance of                            previous anesthesia was also reviewed. The risks                            and benefits of the procedure and the sedation                            options and risks were discussed with the patient.                            All questions were answered, and informed consent                            was obtained. Prior Anticoagulants: The patient has                            taken no previous anticoagulant or antiplatelet                            agents. ASA Grade Assessment: I - A normal, healthy                            patient. After reviewing the risks and benefits,                            the patient was deemed in satisfactory condition to                            undergo the procedure.  After obtaining informed consent, the endoscope was                            passed under direct vision. Throughout the                            procedure, the patient's blood pressure, pulse, and                            oxygen saturations were monitored continuously. The                            Endoscope was introduced through the mouth, and                            advanced to the third part of  duodenum. The upper                            GI endoscopy was accomplished without difficulty.                            The patient tolerated the procedure. Scope In: Scope Out: Findings:                 No gross lesions were noted in the entire                            esophagus. Biopsies were taken with a cold forceps                            for histology from the proximal and middle                            esophagus for EoE. Biopsies were taken with a cold                            forceps for histology from the distal esophagus for                            EoE.                           The Z-line was regular and was found 38 cm from the                            incisors.                           No gross lesions were noted in the entire examined                            stomach. Biopsies were taken with a cold forceps  for histology and Helicobacter pylori testing.                           No gross lesions were noted in the duodenal bulb,                            in the first portion of the duodenum, in the second                            portion of the duodenum, in the major papilla and                            in the third portion of the duodenum. Biopsies for                            histology were taken with a cold forceps for                            evaluation of celiac disease and enteropathy. Complications:            No immediate complications. Estimated Blood Loss:     Estimated blood loss was minimal. Impression:               - No gross lesions in esophagus. Biopsied.                           - Z-line regular, 38 cm from the incisors.                           - No gross lesions in the stomach. Biopsied for HP.                           - No gross lesions in the duodenal bulb, in the                            first portion of the duodenum, in the second                            portion of the duodenum, in the  major papilla and                            in the third portion of the duodenum. Recommendation:           - The patient will be observed post-procedure,                            until all discharge criteria are met.                           - Discharge patient to home.                           - Patient has a contact number available for  emergencies. The signs and symptoms of potential                            delayed complications were discussed with the                            patient. Return to normal activities tomorrow.                            Written discharge instructions were provided to the                            patient.                           - Resume previous diet.                           - Dexilant 60 mg Daily to be started and continued                            for 4-6 weeks.                           - Observe patient's clinical course.                           - Dependent on patient's response to Dexilant, will                            consider pH impedence testing off of PPI, if she                            continues to have issues of burning/pyrosis                            sensation - I query the possibility of                            hypersensitive esophagus, however if acid reflux is                            found then fundoplication may need to be considered.                           - The findings and recommendations were discussed                            with the patient.                           - The findings and recommendations were discussed                            with the patient's family. Justice Britain, MD 02/12/2018 10:06:03  AM

## 2018-02-12 NOTE — Progress Notes (Signed)
During recovery, I initially spoke with the patient's husband and told him about results as patient was resting comfortably. I was then alerted by RN that patient after she awoke had more significant chest discomfort that was different but still a burning sensation in her chest. I evaluated the patient and she stated that she felt OK with just monitoring but I felt that it was low risk to proceed with a CXR to evaluate and ensure no evidence of perforation or free air was noted. We agreed to this. She had CXR and then returned to waiting area. This was reviewed with the patient that Radiology felt there was not significant issue on the CXR and we determined her ability to be discharged fully. She and husband agreed to this plan.  Justice Britain, MD [facility] Office # [phone]

## 2018-02-12 NOTE — Progress Notes (Signed)
Spontaneous respirations throughout. VSS. Resting comfortably. To PACU on room air. Report to  RN. 

## 2018-02-12 NOTE — Patient Instructions (Addendum)
YOU HAD AN ENDOSCOPIC PROCEDURE TODAY AT [facility]:   Refer to the procedure report that was given to you for any specific questions about what was found during the examination.  If the procedure report does not answer your questions, please call your gastroenterologist to clarify.  If you requested that your care partner not be given the details of your procedure findings, then the procedure report has been included in a sealed envelope for you to review at your convenience later.  YOU SHOULD EXPECT: Some feelings of bloating in the abdomen. Passage of more gas than usual.  Walking can help get rid of the air that was put into your GI tract during the procedure and reduce the bloating. If you had a lower endoscopy (such as a colonoscopy or flexible sigmoidoscopy) you may notice spotting of blood in your stool or on the toilet paper. If you underwent a bowel prep for your procedure, you may not have a normal bowel movement for a few days.  Please Note:  You might notice some irritation and congestion in your nose or some drainage.  This is from the oxygen used during your procedure.  There is no need for concern and it should clear up in a day or so.  SYMPTOMS TO REPORT IMMEDIATELY:   Following lower endoscopy (colonoscopy or flexible sigmoidoscopy):  Excessive amounts of blood in the stool  Significant tenderness or worsening of abdominal pains  Swelling of the abdomen that is new, acute  Fever of 100F or higher   For urgent or emergent issues, a gastroenterologist can be reached at any hour by calling [phone].   DIET:  We do recommend a small meal at first, but then you may proceed to your regular diet.  Drink plenty of fluids but you should avoid alcoholic beverages for 24 hours.  ACTIVITY:  You should plan to take it easy for the rest of today and you should NOT DRIVE or use heavy machinery until tomorrow (because of the sedation medicines used during the test).     FOLLOW UP: Our staff will call the number listed on your records the next business day following your procedure to check on you and address any questions or concerns that you may have regarding the information given to you following your procedure. If we do not reach you, we will leave a message.  However, if you are feeling well and you are not experiencing any problems, there is no need to return our call.  We will assume that you have returned to your regular daily activities without incident.  If any biopsies were taken you will be contacted by phone or by letter within the next 1-3 weeks.  Please call us at [phone] if you have not heard about the biopsies in 3 weeks.    SIGNATURES/CONFIDENTIALITY: You and/or your care partner have signed paperwork which will be entered into your electronic medical record.  These signatures attest to the fact that that the information above on your After Visit Summary has been reviewed and is understood.  Full responsibility of the confidentiality of this discharge information lies with you and/or your care-partner.  Call if your fever is 100 degrees or higher. Call if you vomit bright red blood or coffee ground material. Call if you stools appear black. Call if you have pain in shoulder blade area or new chest pain. Call for painful and persistant difficulty swallowing.

## 2018-02-12 NOTE — Progress Notes (Signed)
Called to room to assist during endoscopic procedure.  Patient ID and intended procedure confirmed with present staff. Received instructions for my participation in the procedure from the performing physician.  

## 2018-02-12 NOTE — Progress Notes (Signed)
Pt to have chest x-ray prior to discharge based on what she descibes a new chest pain.  Dr. Rush Landmark will review results and Advise pt.

## 2018-02-13 ENCOUNTER — Telehealth: Payer: Self-pay | Admitting: *Deleted

## 2018-02-13 ENCOUNTER — Telehealth: Payer: Self-pay

## 2018-02-13 NOTE — Telephone Encounter (Signed)
Attempted to reach pt. No answer.

## 2018-02-13 NOTE — Telephone Encounter (Signed)
Left message on f/u call 

## 2018-02-15 ENCOUNTER — Encounter: Payer: Self-pay | Admitting: Gastroenterology

## 2018-03-13 ENCOUNTER — Other Ambulatory Visit: Payer: Self-pay

## 2018-03-13 ENCOUNTER — Ambulatory Visit: Payer: 59 | Admitting: Family Medicine

## 2018-03-13 ENCOUNTER — Encounter: Payer: Self-pay | Admitting: Family Medicine

## 2018-03-13 VITALS — BP 107/74 | HR 86 | Temp 98.8°F | Resp 18 | Ht 62.0 in | Wt 123.8 lb

## 2018-03-13 DIAGNOSIS — B359 Dermatophytosis, unspecified: Secondary | ICD-10-CM | POA: Diagnosis not present

## 2018-03-13 DIAGNOSIS — R5383 Other fatigue: Secondary | ICD-10-CM | POA: Diagnosis not present

## 2018-03-13 DIAGNOSIS — Z8349 Family history of other endocrine, nutritional and metabolic diseases: Secondary | ICD-10-CM | POA: Diagnosis not present

## 2018-03-13 MED ORDER — TERBINAFINE HCL 250 MG PO TABS: 250.0000 mg | ORAL_TABLET | Freq: Every day | ORAL | 0 refills | Status: DC

## 2018-03-13 NOTE — Patient Instructions (Signed)
° ° ° °  If you have lab work done today you will be contacted with your lab results within the next 2 weeks.  If you have not heard from us then please contact us. The fastest way to get your results is to register for My Chart. ° ° °IF you received an x-ray today, you will receive an invoice from [facility]. Please contact [facility] at [phone] with questions or concerns regarding your invoice.  ° °IF you received labwork today, you will receive an invoice from [facility]. Please contact [facility] at [phone] with questions or concerns regarding your invoice.  ° °Our billing staff will not be able to assist you with questions regarding bills from these companies. ° °You will be contacted with the lab results as soon as they are available. The fastest way to get your results is to activate your My Chart account. Instructions are located on the last page of this paperwork. If you have not heard from us regarding the results in 2 weeks, please contact this office. °  ° ° ° °

## 2018-03-13 NOTE — Progress Notes (Signed)
2/26/202010:23 AM  Caitlyn Miller 03-16-1985, 33 y.o. female [id number]  Chief Complaint  Patient presents with  . Fatigue    muscle/joint pain and has some results she would like to show DR not taking anything started about a week ago     HPI:   Patient is a 33 y.o. female with past medical history significant for GERD who presents today for fatigue, muscle and joint pain x 1 week  Had egd recently - unremarkable Started on delixant but not really helping with upper chest pain and bottom of throat that radiates to her back She has a mother and sister that have recently been diagnosed with hashimotos disease Sometimes feels sharp stabbing pain in her throat, denies dysphagia or growth She is requesting to be tested for hashimitos  She is always fatigue, losing hair Menses heavy and painful, but regular No issues with constipation Has lost weight gain, difficult to gain weight Does not sleep well, occ SOB Has foggy brain  Also having issue with ringworm Has tried OTC antifungal medication x 2 weeks, not resolved  Wt Readings from Last 3 Encounters:  03/13/18 123 lb 12.8 oz (56.2 kg)  02/12/18 125 lb (56.7 kg)  02/08/18 125 lb 6 oz (56.9 kg)    Fall Risk  03/13/2018 05/15/2017 05/01/2017 04/19/2017  Falls in the past year? 0 No No No  Follow up Falls evaluation completed - - -     Depression screen [facility] 2/9 03/13/2018 05/15/2017 04/19/2017  Decreased Interest 0 0 0  Down, Depressed, Hopeless 0 0 0  PHQ - 2 Score 0 0 0    Allergies  Allergen Reactions  . Ibuprofen Nausea And Vomiting    Prior to Admission medications   Medication Sig Start Date End Date Taking? Authorizing Provider  dexlansoprazole (DEXILANT) 60 MG capsule Take 1 capsule (60 mg total) by mouth daily. 02/12/18  Yes Mansouraty, Telford Nab., MD  pantoprazole (PROTONIX) 40 MG tablet Take 40 mg by mouth daily.   Yes [provider]    Past Medical History:  Diagnosis Date  . Abnormal Pap smear   .  Acid reflux   . Anemia   . Decreased appetite 10/08  . Endometriosis 10/2004  . Epigastric pain 10/2006  . FH: migraines   . GBS carrier   . H/O amenorrhea 06/2006  . H/O dyspareunia 7/07  . H/O fatigue   . H/O nausea and vomiting 10/2006  . H/O rubella   . H/O varicella   . Hyperemesis arising during pregnancy    First pregnancy  . Irregular periods/menstrual cycles 02/2005  . Pelvic pain 09/2008    Past Surgical History:  Procedure Laterality Date  . WISDOM TOOTH EXTRACTION      Social History   Tobacco Use  . Smoking status: Never Smoker  . Smokeless tobacco: Never Used  Substance Use Topics  . Alcohol use: No    Family History  Problem Relation Age of Onset  . Migraines Mother   . Hypertension Father   . Colon cancer Neg Hx   . Stomach cancer Neg Hx     ROS Per hpi  OBJECTIVE:  Blood pressure 107/74, pulse 86, temperature 98.8 F (37.1 C), temperature source Oral, resp. rate 18, height 5\' 2"  (1.575 m), weight 123 lb 12.8 oz (56.2 kg), last menstrual period 03/09/2018, SpO2 95 %. Body mass index is 22.64 kg/m.   Physical Exam Vitals signs and nursing note reviewed.  Constitutional:      Appearance:  She is well-developed.  HENT:     Head: Normocephalic and atraumatic.  Eyes:     General: No scleral icterus.    Conjunctiva/sclera: Conjunctivae normal.     Pupils: Pupils are equal, round, and reactive to light.  Neck:     Musculoskeletal: Neck supple.  Pulmonary:     Effort: Pulmonary effort is normal.  Skin:    General: Skin is warm and dry.     Findings: Rash (left thigh and calf with annular erythematous scaly macules) present.  Neurological:     Mental Status: She is alert and oriented to person, place, and time.     ASSESSMENT and PLAN  1. Fatigue, unspecified type 2. FHx: thyroid disease - Thyroglobulin antibody - TSH - T4, Free  3. Ringworm Has failed topical treatment, will do oral treatment - terbinafine (LAMISIL) 250 MG tablet;  Take 1 tablet (250 mg total) by mouth daily.  Return if symptoms worsen or fail to improve.    Rutherford Guys, MD Primary Care at [facility] [place], [place] [address] Ph.  [phone] Fax [phone]

## 2018-03-14 ENCOUNTER — Telehealth: Payer: Self-pay

## 2018-03-14 LAB — THYROGLOBULIN ANTIBODY: Thyroglobulin Antibody: <1 IU/mL (ref 0.0–0.9)

## 2018-03-14 LAB — T4, FREE: Free T4: 1.15 ng/dL (ref 0.82–1.77)

## 2018-03-14 LAB — TSH: TSH: 1.98 u[IU]/mL (ref 0.450–4.500)

## 2018-03-14 NOTE — Telephone Encounter (Signed)
Appt made for 4/3 930 am with Dr Rush Landmark.  Left message on machine to call back

## 2018-03-14 NOTE — Telephone Encounter (Signed)
-----   Message from Irving Copas., MD sent at 03/13/2018  8:04 PM EST ----- Benay Spice, Thank you for letting me know. I'm sorry she is still having issues. I suspect that she may have hypersensitive esophagus more than significant acid reflux. She should be coming back to see me or our Pas in clinic in the next 3-4 weeks. We may proceed with a pH Impedence testing off PPI to see things. Robbye Dede please reach out to patient and lets set her up for a follow up in the next 3-4 weeks with Anderson Malta or myself. Thanks. GM ----- Message ----- From: Rutherford Guys, MD Sent: 03/13/2018   1:15 PM EST To: Irving Copas., MD

## 2018-03-15 NOTE — Telephone Encounter (Signed)
Unable to reach pt letter mailed 

## 2018-03-19 ENCOUNTER — Telehealth: Payer: Self-pay | Admitting: Family Medicine

## 2018-03-19 NOTE — Telephone Encounter (Signed)
Copied from [facility] [id number]. Topic: General - Other >> Mar 19, 2018  3:58 PM Mcneil, Ja-Kwan wrote: Reason for CRM: Pt request lab results. Pt requests call back.

## 2018-03-19 NOTE — Telephone Encounter (Signed)
Please review and advise.

## 2018-03-20 NOTE — Telephone Encounter (Signed)
See result 

## 2018-03-21 NOTE — Telephone Encounter (Signed)
Patients husband is calling back for lab results. Please advise. Thank you

## 2018-03-21 NOTE — Telephone Encounter (Signed)
Pt's spouse calling to get results.  Per FC no one available to speak with him at this time.

## 2018-03-22 ENCOUNTER — Encounter: Payer: Self-pay | Admitting: Radiology

## 2018-03-22 NOTE — Telephone Encounter (Signed)
Called and informed a pt of labs. Advised to call office back about concerns.

## 2018-03-22 NOTE — Telephone Encounter (Signed)
Patient spouse is calling back for lab results. Please advise. Thank you

## 2018-04-19 ENCOUNTER — Ambulatory Visit: Payer: 59 | Admitting: Gastroenterology

## 2018-06-17 ENCOUNTER — Other Ambulatory Visit: Payer: Self-pay | Admitting: Family Medicine

## 2018-06-17 DIAGNOSIS — E559 Vitamin D deficiency, unspecified: Secondary | ICD-10-CM

## 2019-09-18 ENCOUNTER — Encounter: Payer: Self-pay | Admitting: Family Medicine

## 2019-09-18 ENCOUNTER — Other Ambulatory Visit: Payer: Self-pay

## 2019-09-18 ENCOUNTER — Ambulatory Visit: Payer: 59 | Admitting: Family Medicine

## 2019-09-18 VITALS — BP 112/74 | HR 76 | Temp 97.6°F | Ht 62.0 in | Wt 127.0 lb

## 2019-09-18 DIAGNOSIS — D234 Other benign neoplasm of skin of scalp and neck: Secondary | ICD-10-CM

## 2019-09-18 DIAGNOSIS — Z9882 Breast implant status: Secondary | ICD-10-CM | POA: Diagnosis not present

## 2019-09-18 DIAGNOSIS — D224 Melanocytic nevi of scalp and neck: Secondary | ICD-10-CM | POA: Diagnosis not present

## 2019-09-18 DIAGNOSIS — F4321 Adjustment disorder with depressed mood: Secondary | ICD-10-CM | POA: Diagnosis not present

## 2019-09-18 DIAGNOSIS — K219 Gastro-esophageal reflux disease without esophagitis: Secondary | ICD-10-CM | POA: Diagnosis not present

## 2019-09-18 MED ORDER — PANTOPRAZOLE SODIUM 40 MG PO TBEC: 40.0000 mg | DELAYED_RELEASE_TABLET | Freq: Every day | ORAL | 5 refills | Status: DC | PRN

## 2019-09-18 MED ORDER — HYDROXYZINE HCL 25 MG PO TABS
25.0000 mg | ORAL_TABLET | Freq: Every evening | ORAL | 0 refills | Status: DC | PRN
Start: 2019-09-18 — End: 2020-01-27

## 2019-09-18 MED ORDER — ESCITALOPRAM OXALATE 10 MG PO TABS: 10.0000 mg | ORAL_TABLET | Freq: Every day | ORAL | 1 refills | Status: DC

## 2019-09-18 NOTE — Progress Notes (Signed)
9/2/20218:28 AM  Caitlyn Miller [birth year], 34 y.o., female [id number]  Chief Complaint  Patient presents with  . Depression    [place] from [place], [place] refills  . Breast Problem    implants done 9 months ago, right side feels hard and has her concerned    HPI:   Patient is a 34 y.o. female who presents today with couple of concerns  She was prescribed medication for depression in home country  She was prescribed lexapro 10mg  and alprazolam 0.5mg  4 months ago after the death of her father They are working well for her She has been taking lexapro and 1/2 tab alprazolam every night  She has breast augmentation here in [place] 9 months ago since then she has noticed area of hardness on right breast She reports MRI done 4 months at country of origin, which was normal She has appt next week with breast surgeon  Has growing mole on her right scalp, states it is now bothering her, it bleeds occassionally  She is requesting rx for pantoprazole which she takes as needed for gerd  Depression screen [facility] 2/9 09/18/2019 03/13/2018 05/15/2017  Decreased Interest 1 0 0  Down, Depressed, Hopeless 1 0 0  PHQ - 2 Score 2 0 0  Altered sleeping 1 - -  Tired, decreased energy 1 - -  Change in appetite 1 - -  Feeling bad or failure about yourself  2 - -  Trouble concentrating 1 - -  Moving slowly or fidgety/restless 2 - -  Suicidal thoughts 0 - -  PHQ-9 Score 10 - -  Difficult doing work/chores Somewhat difficult - -    Fall Risk  03/13/2018 05/15/2017 05/01/2017 04/19/2017  Falls in the past year? 0 No No No  Follow up Falls evaluation completed - - -     Allergies  Allergen Reactions  . Ibuprofen Nausea And Vomiting    Prior to Admission medications   Medication Sig Start Date End Date Taking? Authorizing Provider  ALPRAZolam Duanne Moron) 0.5 MG tablet Take by mouth at bedtime as needed for anxiety (takes half a pill due to grief).   Yes [provider]    escitalopram (LEXAPRO) 10 MG tablet Take 10 mg by mouth daily.   Yes [provider]    Past Medical History:  Diagnosis Date  . Abnormal Pap smear   . Acid reflux   . Anemia   . Decreased appetite 10/08  . Endometriosis 10/2004  . Epigastric pain 10/2006  . FH: migraines   . GBS carrier   . H/O amenorrhea 06/2006  . H/O dyspareunia 7/07  . H/O fatigue   . H/O nausea and vomiting 10/2006  . H/O rubella   . H/O varicella   . Hyperemesis arising during pregnancy    First pregnancy  . Irregular periods/menstrual cycles 02/2005  . Pelvic pain 09/2008    Past Surgical History:  Procedure Laterality Date  . WISDOM TOOTH EXTRACTION      Social History   Tobacco Use  . Smoking status: Never Smoker  . Smokeless tobacco: Never Used  Substance Use Topics  . Alcohol use: No    Family History  Problem Relation Age of Onset  . Migraines Mother   . Hypertension Father   . Colon cancer Neg Hx   . Stomach cancer Neg Hx     ROS Per hpi  OBJECTIVE:  Today's Vitals   09/18/19 0817  BP: 112/74  Pulse: 76  Temp:  97.6 F (36.4 C)  SpO2: 97%  Weight: 127 lb (57.6 kg)  Height: 5\' 2"  (1.575 m)   Body mass index is 23.23 kg/m.   Physical Exam Vitals and nursing note reviewed.  Constitutional:      Appearance: She is well-developed.  HENT:     Head: Normocephalic and atraumatic.  Eyes:     General: No scleral icterus.    Conjunctiva/sclera: Conjunctivae normal.     Pupils: Pupils are equal, round, and reactive to light.  Pulmonary:     Effort: Pulmonary effort is normal.  Musculoskeletal:     Cervical back: Neck supple.  Skin:    General: Skin is warm and dry.  Neurological:     Mental Status: She is alert and oriented to person, place, and time.     No results found for this or any previous visit (from the past 24 hour(s)).  No results found.   ASSESSMENT and PLAN  1. Grief Good family support. Cont lexapro, discussed concerns re bzd,  changing to vistaril as needed for sleep/anxiety  2. S/P breast augmentation Advise patient discuss her concerns with surgeon  3. Gastroesophageal reflux disease without esophagitis Stable. Cont prn use of PPI  4. Epidermal nevus of scalp - Ambulatory referral to Dermatology  Other orders - escitalopram (LEXAPRO) 10 MG tablet; Take 1 tablet (10 mg total) by mouth at bedtime. - hydrOXYzine (ATARAX/VISTARIL) 25 MG tablet; Take 1 tablet (25 mg total) by mouth at bedtime as needed for anxiety. - pantoprazole (PROTONIX) 40 MG tablet; Take 1 tablet (40 mg total) by mouth daily as needed.  No follow-ups on file.    Rutherford Guys, MD Primary Care at [facility] [place], [place] [address] Ph.  [phone] Fax [phone]

## 2019-09-18 NOTE — Patient Instructions (Signed)
° ° ° °  If you have lab work done today you will be contacted with your lab results within the next 2 weeks.  If you have not heard from us then please contact us. The fastest way to get your results is to register for My Chart. ° ° °IF you received an x-ray today, you will receive an invoice from [facility]. Please contact [facility] at [phone] with questions or concerns regarding your invoice.  ° °IF you received labwork today, you will receive an invoice from [facility]. Please contact [facility] at [phone] with questions or concerns regarding your invoice.  ° °Our billing staff will not be able to assist you with questions regarding bills from these companies. ° °You will be contacted with the lab results as soon as they are available. The fastest way to get your results is to activate your My Chart account. Instructions are located on the last page of this paperwork. If you have not heard from us regarding the results in 2 weeks, please contact this office. °  ° ° ° °

## 2019-09-24 ENCOUNTER — Encounter (HOSPITAL_COMMUNITY): Payer: Self-pay

## 2019-09-24 ENCOUNTER — Ambulatory Visit (HOSPITAL_COMMUNITY)
Admission: EM | Admit: 2019-09-24 | Discharge: 2019-09-24 | Disposition: A | Discharge status: Home / Self Care | Payer: 59 | Attending: Family Medicine | Admitting: Family Medicine

## 2019-09-24 DIAGNOSIS — J069 Acute upper respiratory infection, unspecified: Secondary | ICD-10-CM

## 2019-09-24 DIAGNOSIS — D649 Anemia, unspecified: Secondary | ICD-10-CM | POA: Diagnosis not present

## 2019-09-24 DIAGNOSIS — K219 Gastro-esophageal reflux disease without esophagitis: Secondary | ICD-10-CM | POA: Diagnosis not present

## 2019-09-24 DIAGNOSIS — Z20822 Contact with and (suspected) exposure to covid-19: Secondary | ICD-10-CM | POA: Insufficient documentation

## 2019-09-24 DIAGNOSIS — Z886 Allergy status to analgesic agent status: Secondary | ICD-10-CM | POA: Insufficient documentation

## 2019-09-24 DIAGNOSIS — Z79899 Other long term (current) drug therapy: Secondary | ICD-10-CM | POA: Diagnosis not present

## 2019-09-24 NOTE — ED Provider Notes (Signed)
[place]    CSN: [id number] Arrival date & time: 09/24/19  1508      History   Chief Complaint Chief Complaint  Patient presents with  . Cough    HPI Caitlyn Miller is a 34 y.o. female.   Chills, body aches, headaches, cough, chest soreness, low grade fever x 2 days. Denies CP, SOB, abdominal pain, N/V/D, rashes. Taking tylenol with some mild temporary relief. Son tested positive for COVID 2 days ago. No known hx of pulmonary disease.      Past Medical History:  Diagnosis Date  . Abnormal Pap smear   . Acid reflux   . Anemia   . Decreased appetite 10/08  . Endometriosis 10/2004  . Epigastric pain 10/2006  . FH: migraines   . GBS carrier   . H/O amenorrhea 06/2006  . H/O dyspareunia 7/07  . H/O fatigue   . H/O nausea and vomiting 10/2006  . H/O rubella   . H/O varicella   . Hyperemesis arising during pregnancy    First pregnancy  . Irregular periods/menstrual cycles 02/2005  . Pelvic pain 09/2008    Patient Active Problem List   Diagnosis Date Noted  . S/P breast augmentation 09/18/2019  . Hx of migraines 06/20/2011  . GERD (gastroesophageal reflux disease) 06/20/2011  . Anemia 06/20/2011    Past Surgical History:  Procedure Laterality Date  . WISDOM TOOTH EXTRACTION      OB History    Gravida  2   Para  2   Term  2   Preterm      AB      Living  2     SAB      TAB      Ectopic      Multiple      Live Births  2            Home Medications    Prior to Admission medications   Medication Sig Start Date End Date Taking? Authorizing Provider  hydrOXYzine (ATARAX/VISTARIL) 25 MG tablet Take 1 tablet (25 mg total) by mouth at bedtime as needed for anxiety. 09/18/19  Yes Rutherford Guys, MD  pantoprazole (PROTONIX) 40 MG tablet Take 1 tablet (40 mg total) by mouth daily as needed. 09/18/19  Yes Rutherford Guys, MD  escitalopram (LEXAPRO) 10 MG tablet Take 1 tablet (10 mg total) by mouth at bedtime. 09/18/19   Rutherford Guys,  MD    Family History Family History  Problem Relation Age of Onset  . Migraines Mother   . Hypertension Father   . Colon cancer Neg Hx   . Stomach cancer Neg Hx     Social History Social History   Tobacco Use  . Smoking status: Never Smoker  . Smokeless tobacco: Never Used  Vaping Use  . Vaping Use: Never used  Substance Use Topics  . Alcohol use: No  . Drug use: No     Allergies   Ibuprofen   Review of Systems Review of Systems PER HPI   Physical Exam Triage Vital Signs ED Triage Vitals [09/24/19 1653]  Enc Vitals Group     BP (!) 113/59     Pulse Rate 61     Resp 18     Temp 98.8 F (37.1 C)     Temp Source Oral     SpO2 100 %     Weight      Height      Head Circumference  Peak Flow      Pain Score      Pain Loc      Pain Edu?      Excl. in [place]?    No data found.  Updated Vital Signs BP (!) 113/59 (BP Location: Left Arm)   Pulse 61   Temp 98.8 F (37.1 C) (Oral)   Resp 18   LMP 09/13/2019   SpO2 100%   Visual Acuity Right Eye Distance:   Left Eye Distance:   Bilateral Distance:    Right Eye Near:   Left Eye Near:    Bilateral Near:     Physical Exam Vitals and nursing note reviewed.  Constitutional:      Appearance: Normal appearance. She is not ill-appearing.  HENT:     Head: Atraumatic.     Right Ear: Tympanic membrane normal.     Left Ear: Tympanic membrane normal.     Nose: Nose normal. No congestion.     Mouth/Throat:     Mouth: Mucous membranes are moist.     Pharynx: Oropharynx is clear. No oropharyngeal exudate.  Eyes:     Extraocular Movements: Extraocular movements intact.     Conjunctiva/sclera: Conjunctivae normal.  Cardiovascular:     Rate and Rhythm: Normal rate and regular rhythm.     Heart sounds: Normal heart sounds.  Pulmonary:     Effort: Pulmonary effort is normal.     Breath sounds: Normal breath sounds.  Abdominal:     General: Bowel sounds are normal. There is no distension.     Palpations:  Abdomen is soft.     Tenderness: There is no abdominal tenderness. There is no right CVA tenderness, left CVA tenderness or guarding.  Musculoskeletal:        General: Normal range of motion.     Cervical back: Normal range of motion and neck supple.  Skin:    General: Skin is warm and dry.  Neurological:     Mental Status: She is alert and oriented to person, place, and time.  Psychiatric:        Mood and Affect: Mood normal.        Thought Content: Thought content normal.        Judgment: Judgment normal.      UC Treatments / Results  Labs (all labs ordered are listed, but only abnormal results are displayed) Labs Reviewed  SARS CORONAVIRUS 2 (TAT 6-24 HRS)    EKG   Radiology No results found.  Procedures Procedures (including critical care time)  Medications Ordered in UC Medications - No data to display  Initial Impression / Assessment and Plan / UC Course  I have reviewed the triage vital signs and the nursing notes.  Pertinent labs & imaging results that were available during my care of the patient were reviewed by me and considered in my medical decision making (see chart for details).     Known exposure to COVID, sxs also suspicious for the same. Await test result, isolation protocol reviewed. Discussed good OTC symptomatic therapies and supportive home care. Return for evaluation if sxs worsening or not improving.   Final Clinical Impressions(s) / UC Diagnoses   Final diagnoses:  Viral URI with cough   Discharge Instructions   None    ED Prescriptions    None     PDMP not reviewed this encounter.   Volney American, [place] 09/24/19 1719

## 2019-09-24 NOTE — ED Triage Notes (Signed)
Pt c/o non-productive cough, sore throat, chills, HA, body aches, diarrhea onset yesterday. Pt's son tested positive for COVId 2 days ago. Denies SOB, CP, ear pain, n/v. Last took tylenol approx 2 hours PTA.

## 2019-09-25 LAB — SARS CORONAVIRUS 2 (TAT 6-24 HRS): SARS Coronavirus 2: NEGATIVE

## 2019-10-01 ENCOUNTER — Telehealth: Payer: Self-pay | Admitting: Dermatology

## 2019-10-01 NOTE — Telephone Encounter (Signed)
Patient's husband is calling for referral appointment from Dr. Grant Fontana, but does not want to wait until February 2022 for appointment, so would like referral sent back to referring provider's office.

## 2019-10-20 ENCOUNTER — Other Ambulatory Visit: Payer: Self-pay | Admitting: Plastic Surgery

## 2019-10-20 DIAGNOSIS — N644 Mastodynia: Secondary | ICD-10-CM

## 2019-11-05 ENCOUNTER — Ambulatory Visit
Admission: RE | Admit: 2019-11-05 | Discharge: 2019-11-05 | Disposition: A | Discharge status: Home / Self Care | Payer: 59 | Source: Ambulatory Visit | Attending: Plastic Surgery | Admitting: Plastic Surgery

## 2019-11-05 ENCOUNTER — Other Ambulatory Visit: Payer: Self-pay

## 2019-11-05 ENCOUNTER — Other Ambulatory Visit: Payer: Self-pay | Admitting: Plastic Surgery

## 2019-11-05 DIAGNOSIS — N644 Mastodynia: Secondary | ICD-10-CM

## 2019-11-05 IMAGING — MG MM  DIGITAL DIAGNOSTIC BREAST BILAT IMPLANT W/ TOMO W/ CAD
8 of 19 series · 8 of 39 positions shown · non-contrast
Comparison: Previous exam(s).

CLINICAL DATA: Patient presents for palpable abnormality within the
upper outer right breast.

EXAM:
DIGITAL DIAGNOSTIC BILATERAL MAMMOGRAM WITH IMPLANTS AND TOMO
ULTRASOUND RIGHT BREAST
The patient has retropectoral implants. Standard and implant
displaced views were performed.

[R XCCL (1 of 2)]
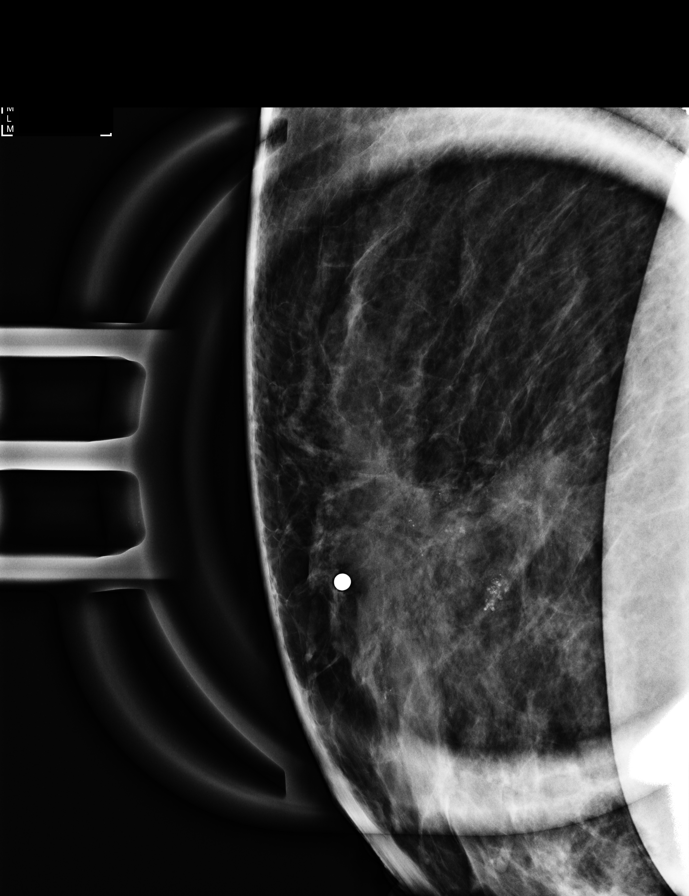

[R ML (1 of 2)]
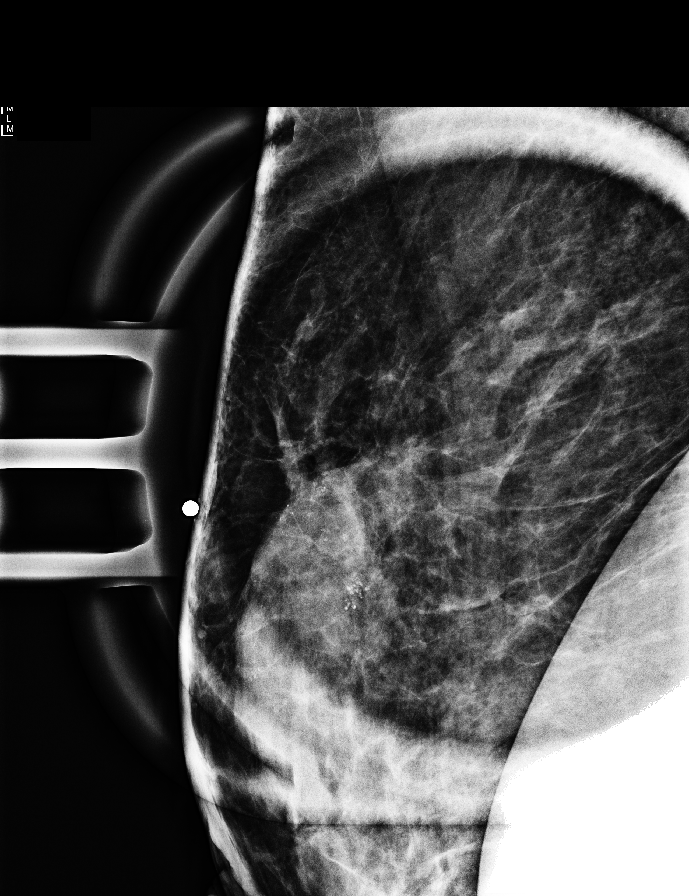

[R ML (2 of 2)]
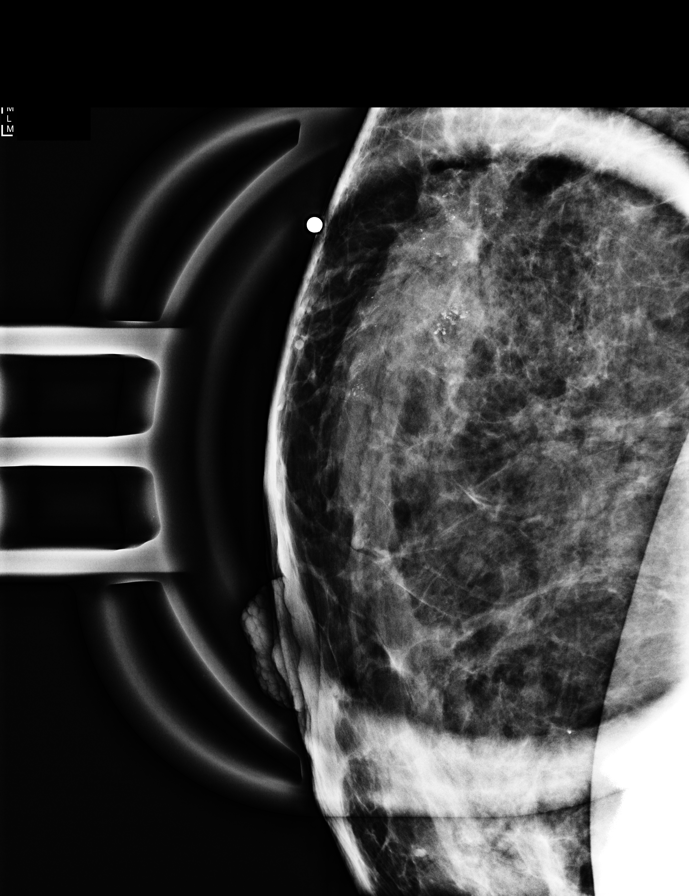

[R CC]
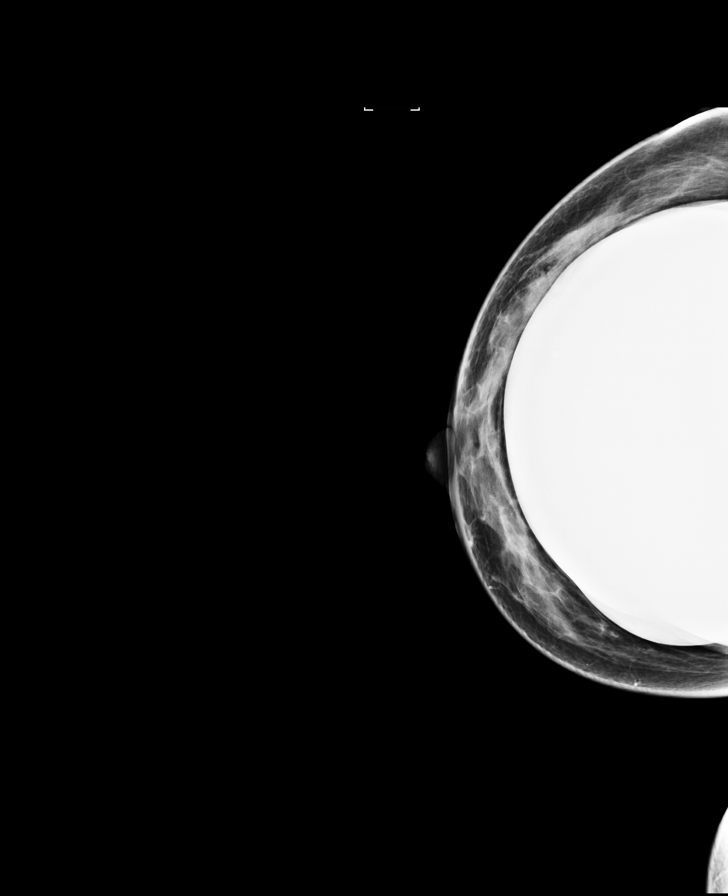

[R MLO]
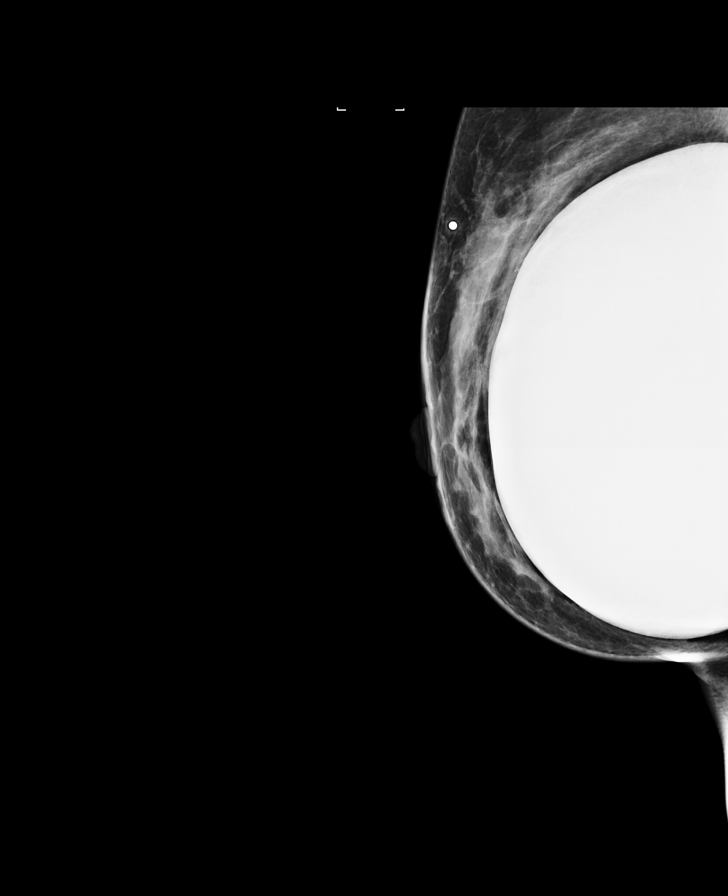

[L CC]
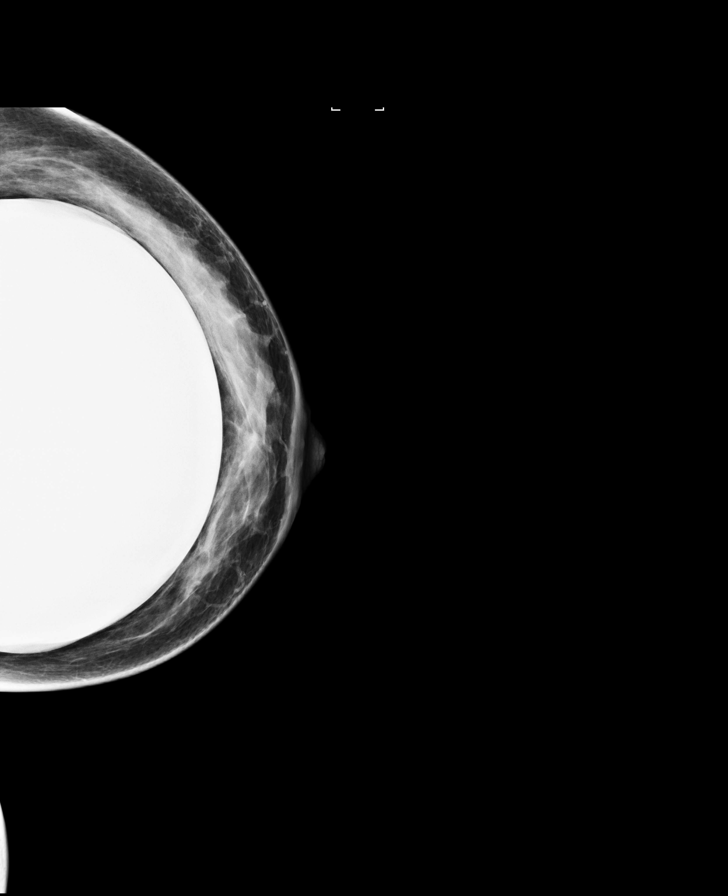

[L MLO]
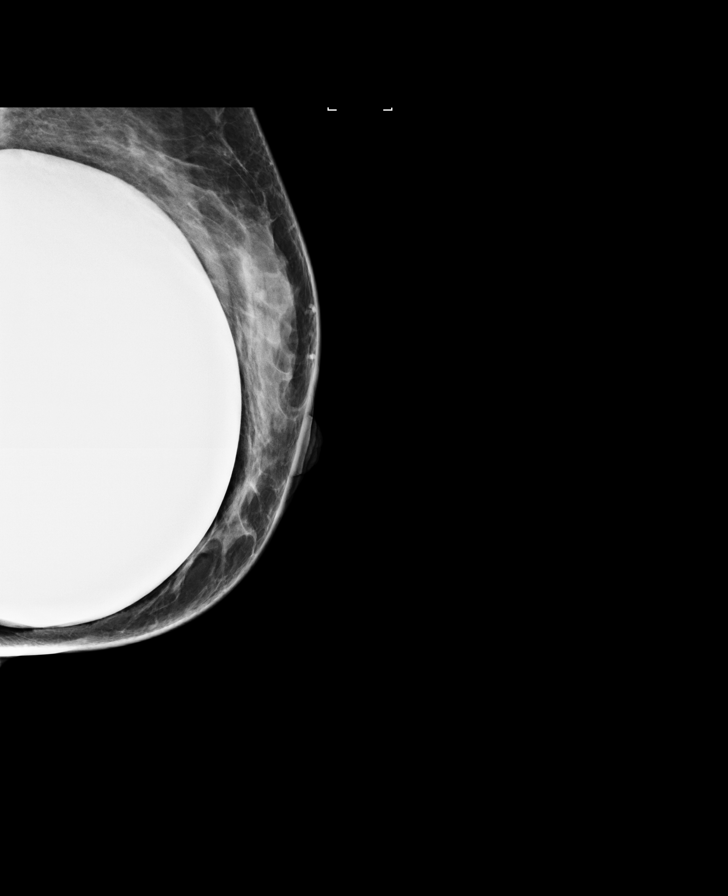

[R XCCL (2 of 2)]
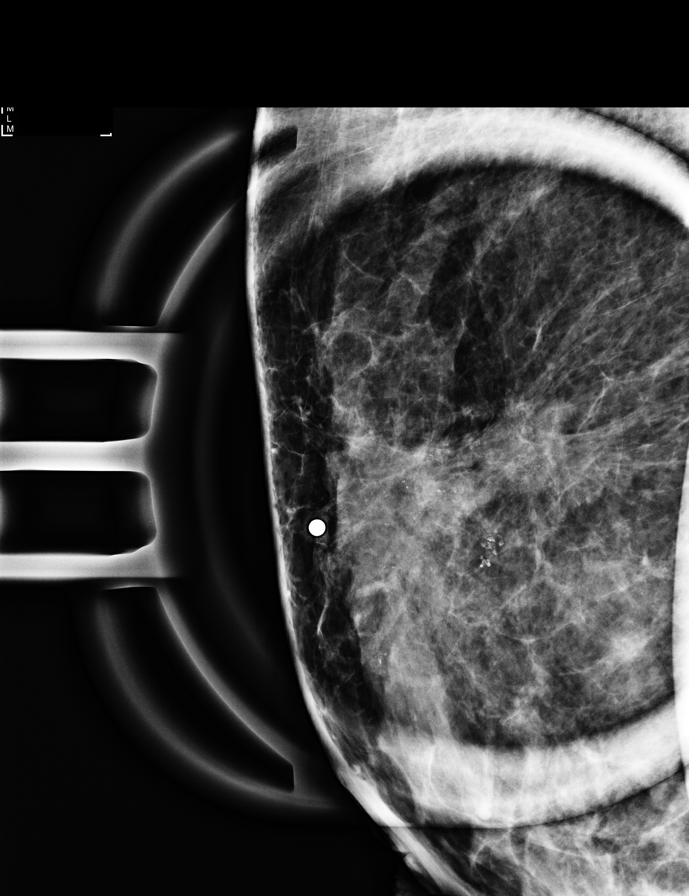

[8 of 39 positions shown; findings below may reference images not displayed]

ACR Breast Density Category c: The breast tissue is heterogeneously
dense, which may obscure small masses.
FINDINGS: Underlying the palpable marker within the upper-outer right breast
is a large irregular spiculated mass, partially obscured due to the
adjacent dense fibroglandular tissue. There are associated coarse
heterogeneous calcifications within the upper-outer right breast.
Calcifications measure an area of 3.8 x 3.8 cm. No suspicious
findings within the left breast.

On physical exam, there is a firm mass within the upper-outer right
breast.

Targeted ultrasound is performed, showing a large irregular
hypoechoic mixed echogenicity mass within the upper-outer right
breast at the site of palpable concern. Given the size and mixed
echogenicity, this is somewhat difficult to measure in entirety
however within the right breast 11 o'clock position 4 cm from nipple
there is a 4.7 x 1.4 x 1.4 cm definable mass.

There are 2 adjacent cortically thickened nodes within the right
axilla measuring up to 4 mm in thickness.
IMPRESSION: Large irregular palpable mass within the upper-outer right breast 11
o'clock position. There is a large area of associated coarse
heterogeneous calcifications within the mass. Overall findings are
concerning for breast carcinoma.

Two cortically thickened right axillary lymph nodes which are
indeterminate in etiology.

RECOMMENDATION:
Ultrasound-guided core needle biopsy of the large palpable mass
upper outer right breast.

Ultrasound-guided core needle biopsy of 1 of the cortically
thickened right axillary lymph nodes.

After the ultrasound-guided biopsies, recommend further evaluation
with bilateral breast MRI given the large nature of the mass,
associated calcifications and dense breast tissue.

I have discussed the findings and recommendations with the patient.
If applicable, a reminder letter will be sent to the patient
regarding the next appointment.

BI-RADS CATEGORY  5: Highly suggestive of malignancy.

## 2019-11-05 IMAGING — US US BREAST*R* LIMITED INC AXILLA
1 series · 13 of 25 positions shown · non-contrast
Comparison: Previous exam(s).

CLINICAL DATA: Patient presents for palpable abnormality within the
upper outer right breast.

EXAM:
DIGITAL DIAGNOSTIC BILATERAL MAMMOGRAM WITH IMPLANTS AND TOMO
ULTRASOUND RIGHT BREAST
The patient has retropectoral implants. Standard and implant
displaced views were performed.

[Series 1: us breast*right* limited inc axilla · 0.07mm/px · 13 of 25 slices shown]
[im 1/25]
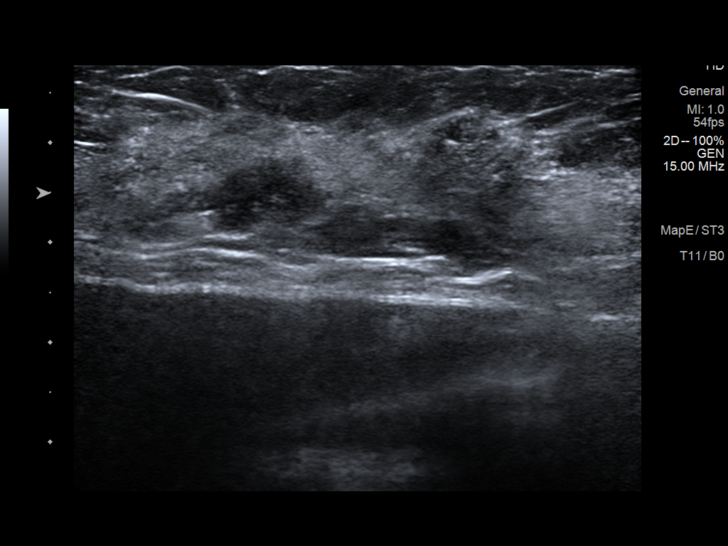
[im 3/25]
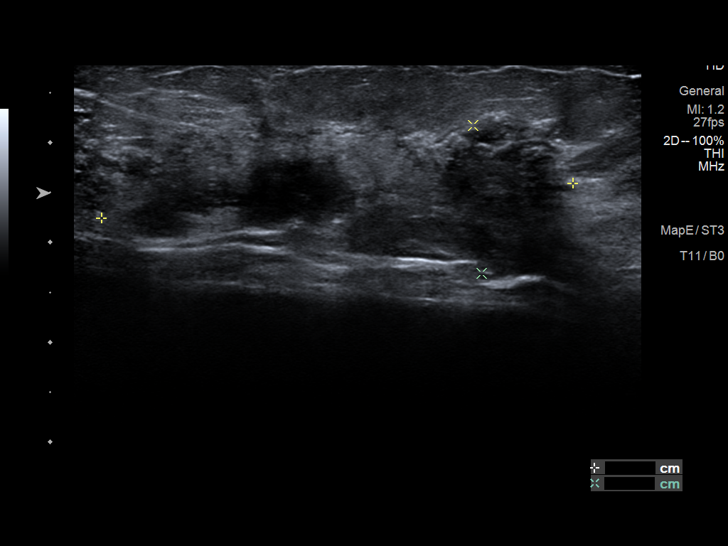
[im 5/25]
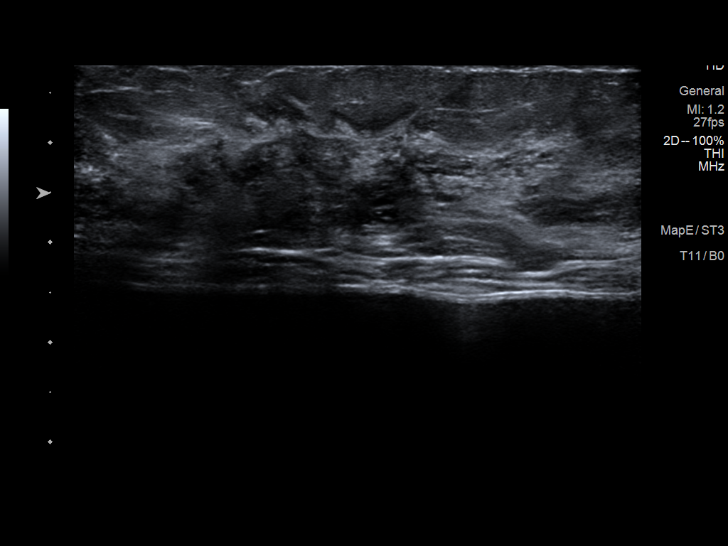
[im 7/25]
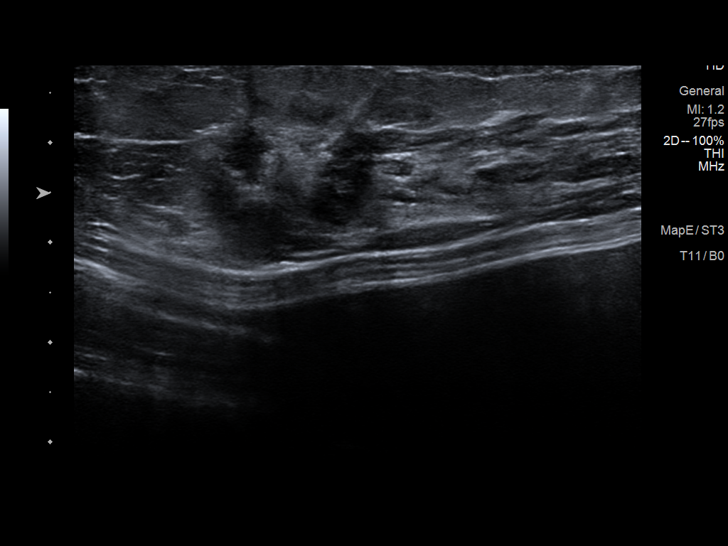
[im 9/25]
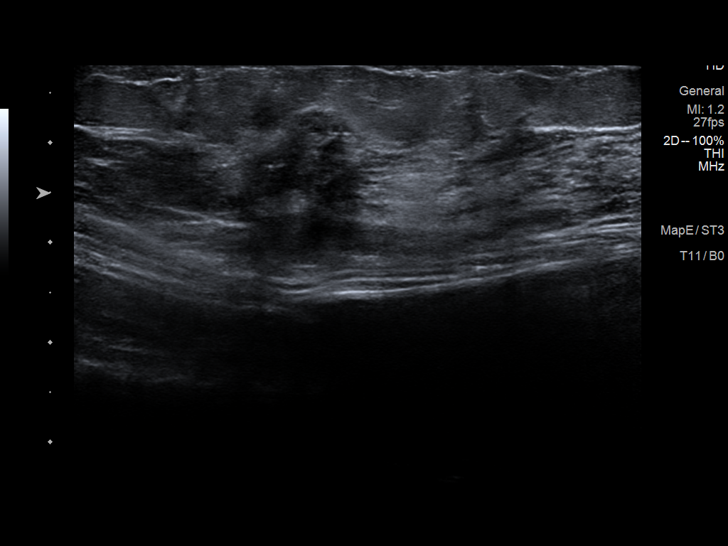
[im 11/25]
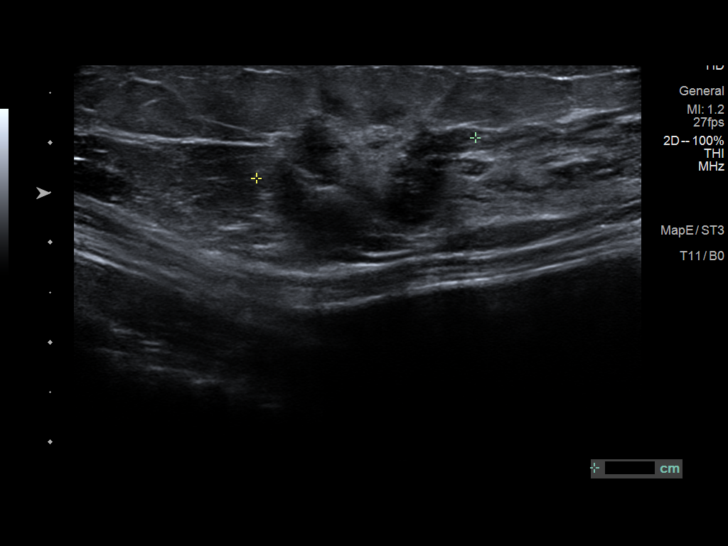
[im 13/25]
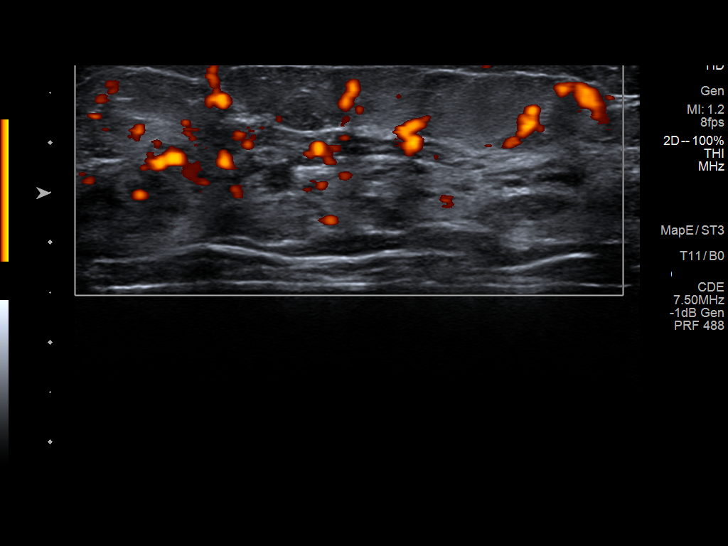
[im 15/25]
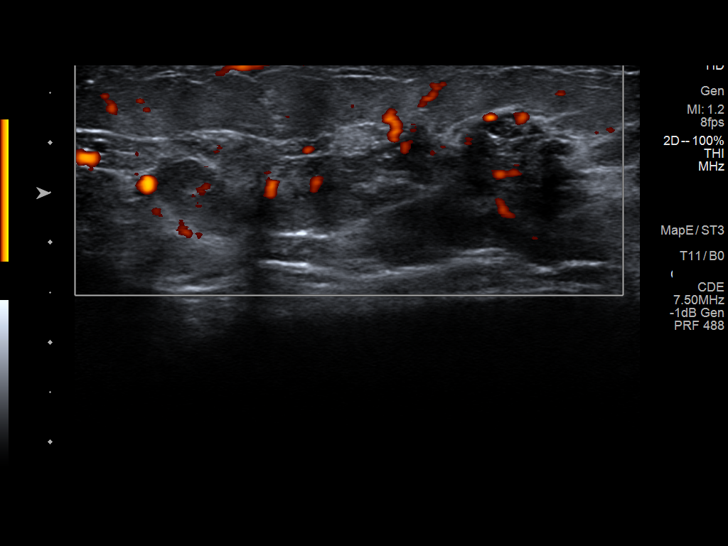
[im 17/25]
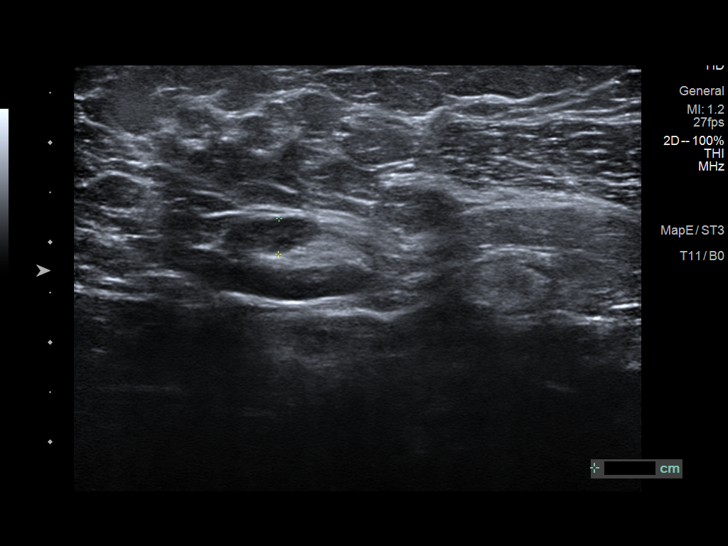
[im 19/25]
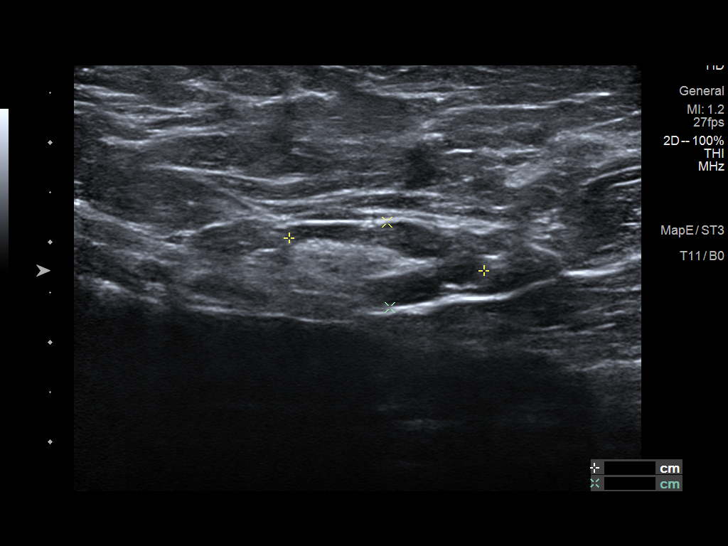
[im 21/25]
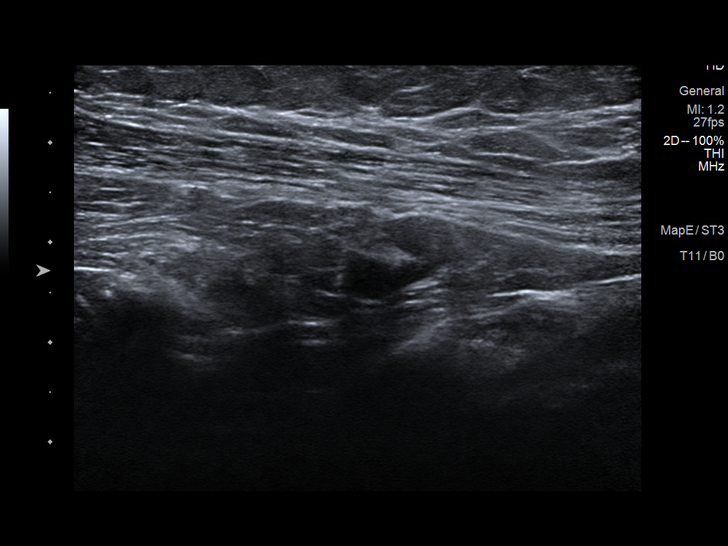
[im 23/25]
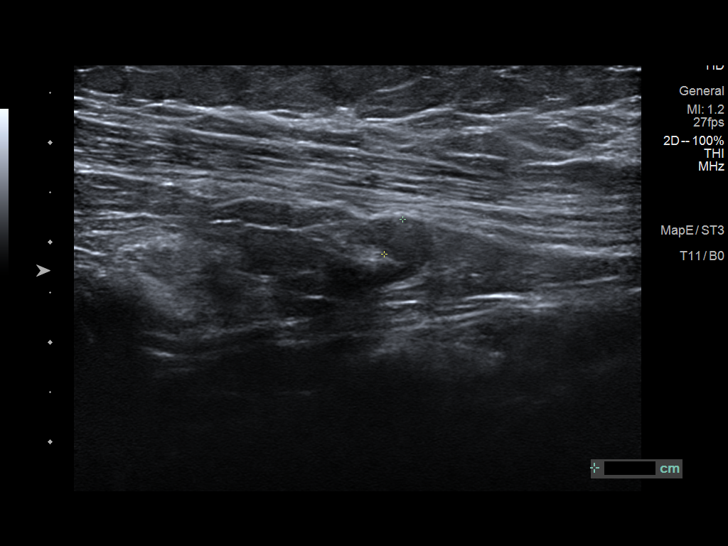
[im 25/25]
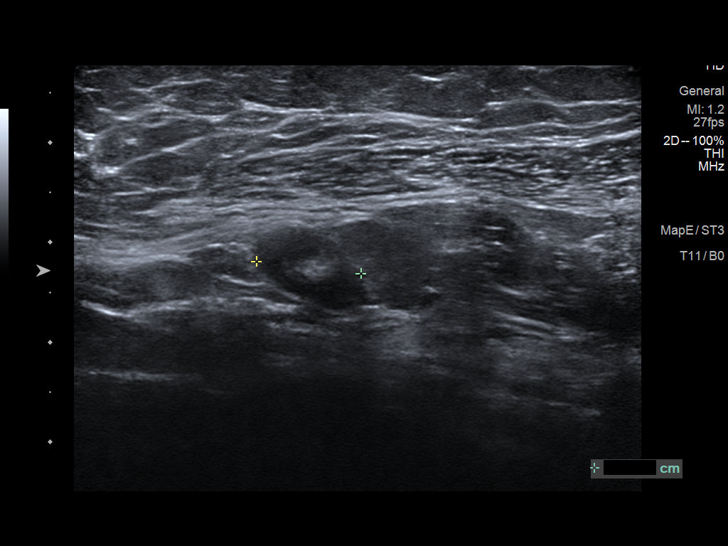

[13 of 25 positions shown; findings below may reference images not displayed]

ACR Breast Density Category c: The breast tissue is heterogeneously
dense, which may obscure small masses.
FINDINGS: Underlying the palpable marker within the upper-outer right breast
is a large irregular spiculated mass, partially obscured due to the
adjacent dense fibroglandular tissue. There are associated coarse
heterogeneous calcifications within the upper-outer right breast.
Calcifications measure an area of 3.8 x 3.8 cm. No suspicious
findings within the left breast.

On physical exam, there is a firm mass within the upper-outer right
breast.

Targeted ultrasound is performed, showing a large irregular
hypoechoic mixed echogenicity mass within the upper-outer right
breast at the site of palpable concern. Given the size and mixed
echogenicity, this is somewhat difficult to measure in entirety
however within the right breast 11 o'clock position 4 cm from nipple
there is a 4.7 x 1.4 x 1.4 cm definable mass.

There are 2 adjacent cortically thickened nodes within the right
axilla measuring up to 4 mm in thickness.
IMPRESSION: Large irregular palpable mass within the upper-outer right breast 11
o'clock position. There is a large area of associated coarse
heterogeneous calcifications within the mass. Overall findings are
concerning for breast carcinoma.

Two cortically thickened right axillary lymph nodes which are
indeterminate in etiology.

RECOMMENDATION:
Ultrasound-guided core needle biopsy of the large palpable mass
upper outer right breast.

Ultrasound-guided core needle biopsy of 1 of the cortically
thickened right axillary lymph nodes.

After the ultrasound-guided biopsies, recommend further evaluation
with bilateral breast MRI given the large nature of the mass,
associated calcifications and dense breast tissue.

I have discussed the findings and recommendations with the patient.
If applicable, a reminder letter will be sent to the patient
regarding the next appointment.

BI-RADS CATEGORY  5: Highly suggestive of malignancy.

## 2019-11-12 ENCOUNTER — Ambulatory Visit
Admission: RE | Admit: 2019-11-12 | Discharge: 2019-11-12 | Disposition: A | Discharge status: Home / Self Care | Payer: 59 | Source: Ambulatory Visit | Attending: Plastic Surgery | Admitting: Plastic Surgery

## 2019-11-12 ENCOUNTER — Other Ambulatory Visit: Payer: Self-pay | Admitting: Plastic Surgery

## 2019-11-12 ENCOUNTER — Other Ambulatory Visit: Payer: Self-pay

## 2019-11-12 DIAGNOSIS — N644 Mastodynia: Secondary | ICD-10-CM

## 2019-11-12 IMAGING — US US  BREAST BX W/ LOC DEV 1ST LESION IMG BX SPEC US GUIDE*R*
1 series · 12 of 16 positions shown · non-contrast
Comparison: Previous exam(s).
COMPARISON: Previous exam(s).

Addendum:
CLINICAL DATA: 34-year-old female with a highly suspicious right
breast mass and indeterminate right axillary lymphadenopathy.

EXAM:
ULTRASOUND GUIDED RIGHT BREAST CORE NEEDLE BIOPSY
ULTRASOUND-GUIDED RIGHT AXILLARY CORE NEEDLE BIOPSY

[Series 1: us breast bx w/ loc dev 1st lesion img bx spec us  · 0.06mm/px · 12 of 16 slices shown]
[im 1/16]
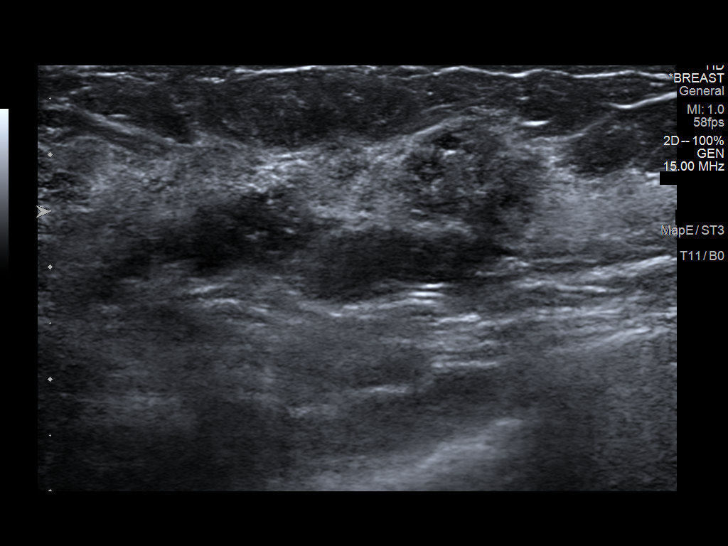
[im 3/16]
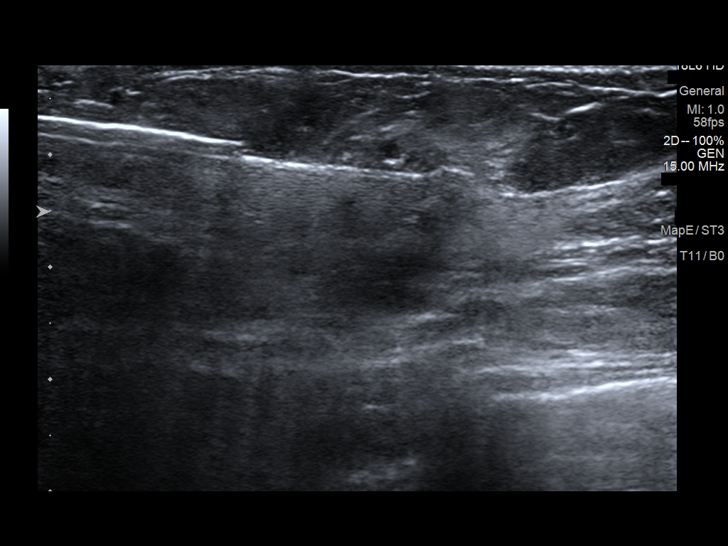
[im 4/16]
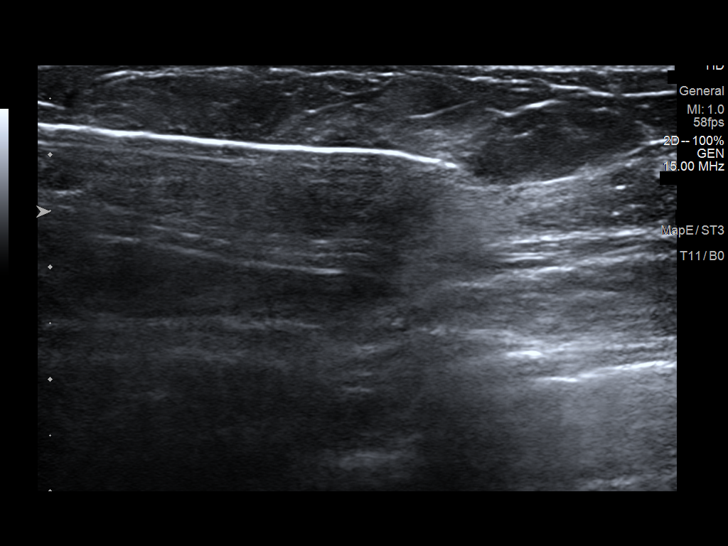
[im 5/16]
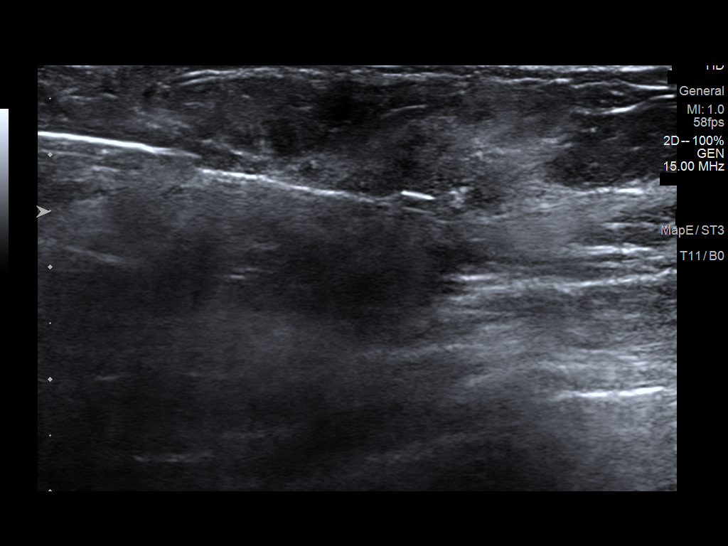
[im 7/16]
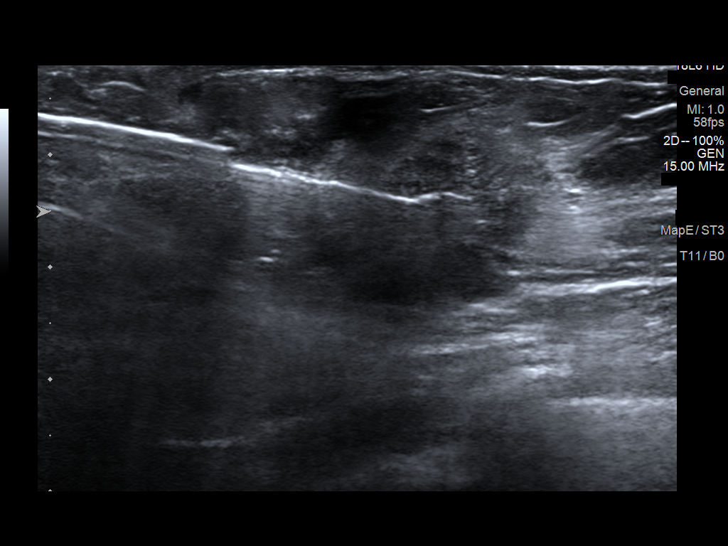
[im 8/16]
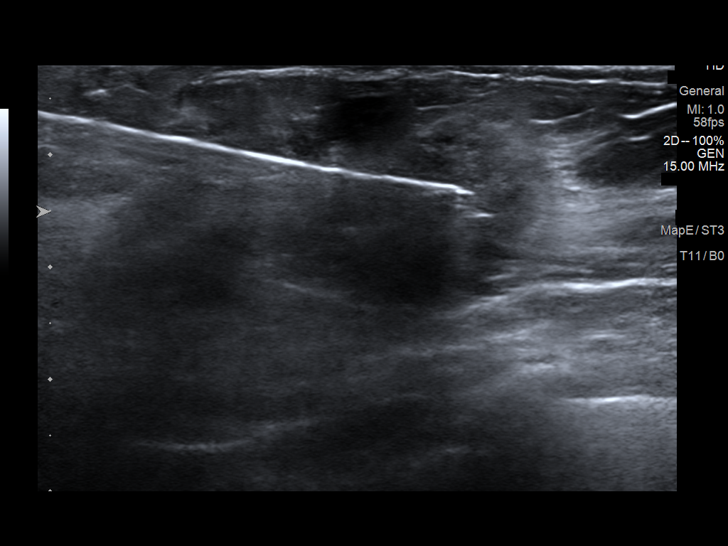
[im 9/16]
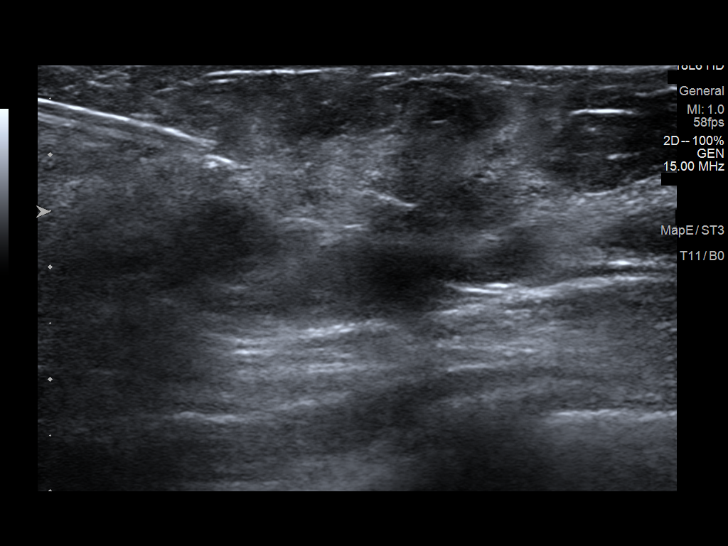
[im 11/16]
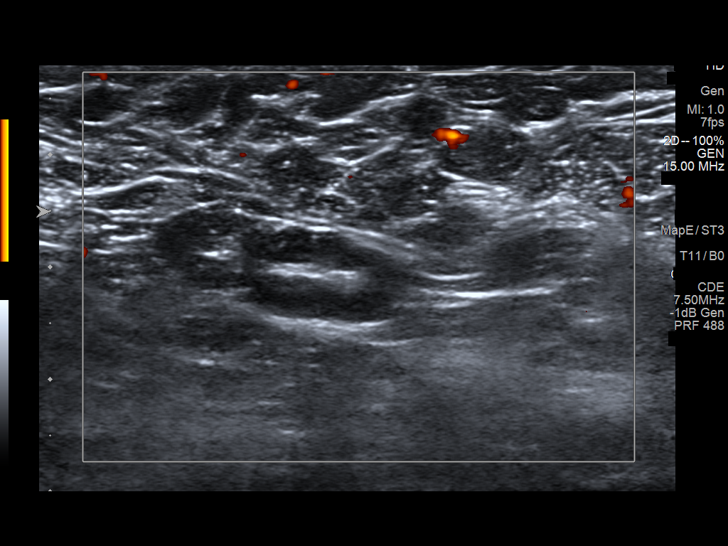
[im 12/16]
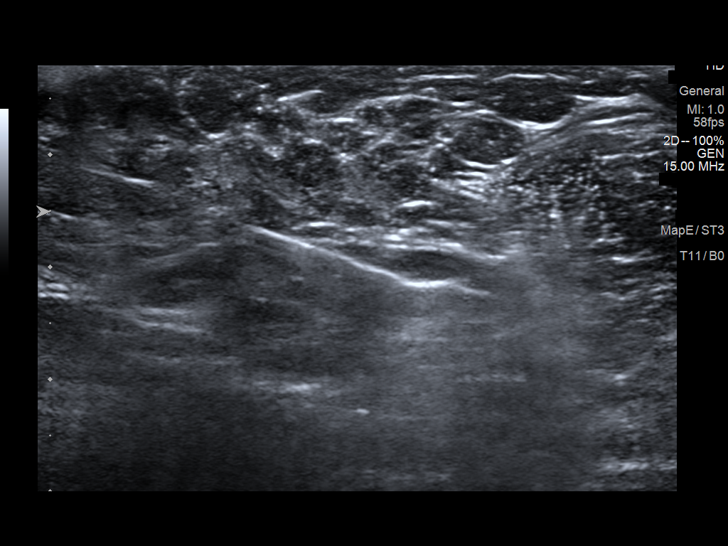
[im 13/16]
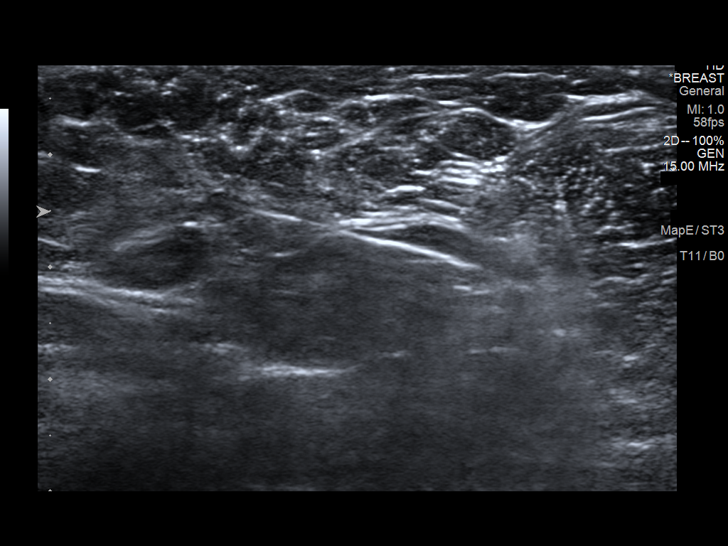
[im 15/16]
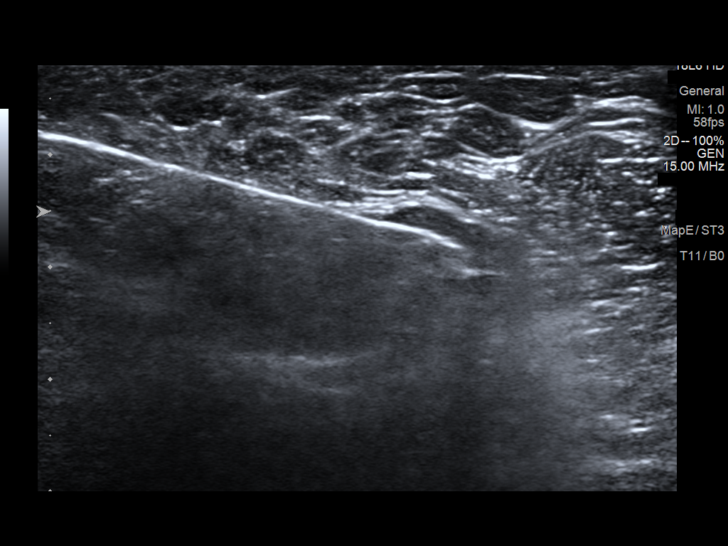
[im 16/16]
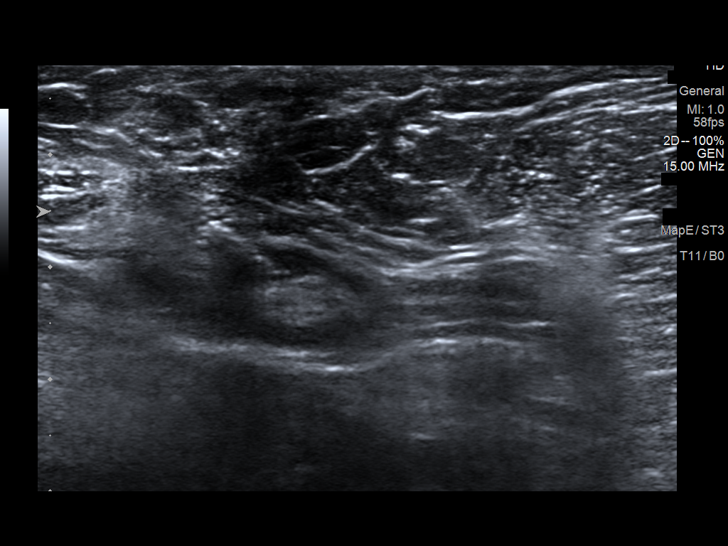

[12 of 16 positions shown; findings below may reference images not displayed]



Lesion quadrant: Upper outer quadrant

Using sterile technique and 1% Lidocaine as local anesthetic, under
direct ultrasound visualization, a 14 gauge TSERING device was
used to perform biopsy of a spiculated mass at the 11 o'clock
position using a lateral approach. At the conclusion of the
procedure ribbon shaped tissue marker clip was deployed into the
biopsy cavity.

Using sterile technique and 1% Lidocaine as local anesthetic, under
direct ultrasound visualization, a 14 gauge TSERING device was
used to perform biopsy of a right axillary lymph node using a
inferior approach. At the conclusion of the procedure HydroMARK
tissue marker clip was deployed into the biopsy cavity.

Follow up 2 view mammogram was performed and dictated separately.
IMPRESSION: Ultrasound guided biopsy of the right breast and axilla. No apparent
complications.

ADDENDUM:
Pathology revealed GRADE II INVASIVE MAMMARY CARCINOMA, MAMMARY
CARCINOMA IN SITU of the RIGHT breast, 11 o'clock, upper outer
quadrant. E-cadherin is strongly positive consistent with a ductal
phenotype. This was found to be concordant by Dr. TSERING.

Pathology revealed METASTATIC MAMMARY CARCINOMA of the RIGHT
axillary lymph node. This was found to be concordant by Dr. TSERING
TSERING.

Pathology results were discussed with the patient and her husband by
telephone. The patient reported doing well after the biopsies with
tenderness at the sites. Post biopsy instructions and care were
reviewed and questions were answered. The patient was encouraged to
call The [REDACTED] for any additional
concerns.

The patient was referred to [REDACTED]
[REDACTED] at [REDACTED] on
[DATE].

The patient will need bilateral breast MRI given age and breast
density, and extent of disease.

Pathology results reported by TSERING RN on [DATE].



Lesion quadrant: Upper outer quadrant

Using sterile technique and 1% Lidocaine as local anesthetic, under
direct ultrasound visualization, a 14 gauge TSERING device was
used to perform biopsy of a spiculated mass at the 11 o'clock
position using a lateral approach. At the conclusion of the
procedure ribbon shaped tissue marker clip was deployed into the
biopsy cavity.

Using sterile technique and 1% Lidocaine as local anesthetic, under
direct ultrasound visualization, a 14 gauge TSERING device was
used to perform biopsy of a right axillary lymph node using a
inferior approach. At the conclusion of the procedure HydroMARK
tissue marker clip was deployed into the biopsy cavity.

Follow up 2 view mammogram was performed and dictated separately.
IMPRESSION: Ultrasound guided biopsy of the right breast and axilla. No apparent
complications.

## 2019-11-12 IMAGING — MG MM BREAST LOCALIZATION CLIP
7 series · 9 of 19 positions shown · non-contrast
Comparison: Previous exam(s).

CLINICAL DATA: 34-year-old female status post ultrasound-guided
biopsy of the right breast and axilla.

EXAM:
DIAGNOSTIC RIGHT MAMMOGRAM POST ULTRASOUND BIOPSY

[R CC]
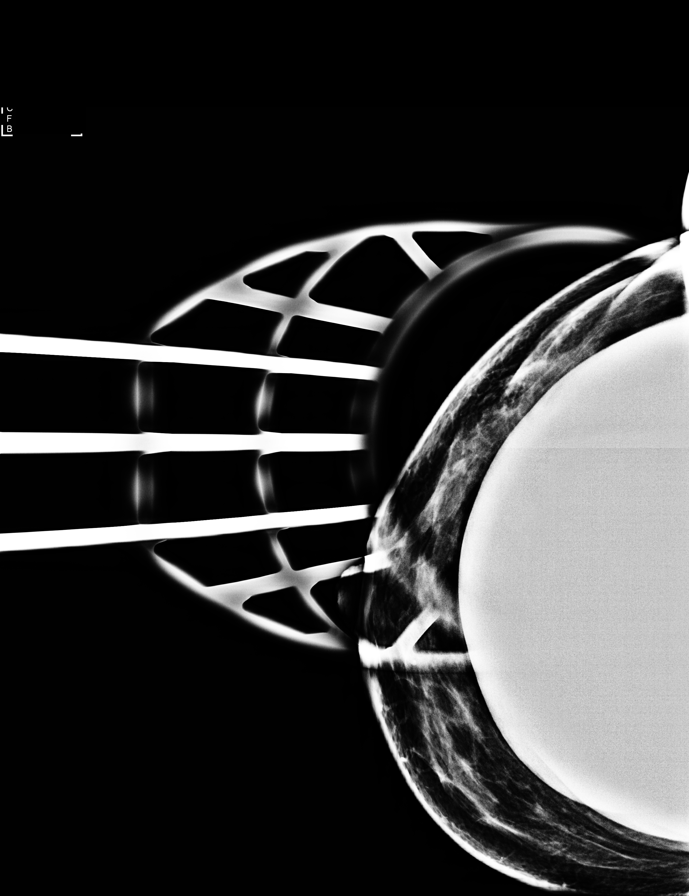

[R MLO synth-2D (1 of 2)]
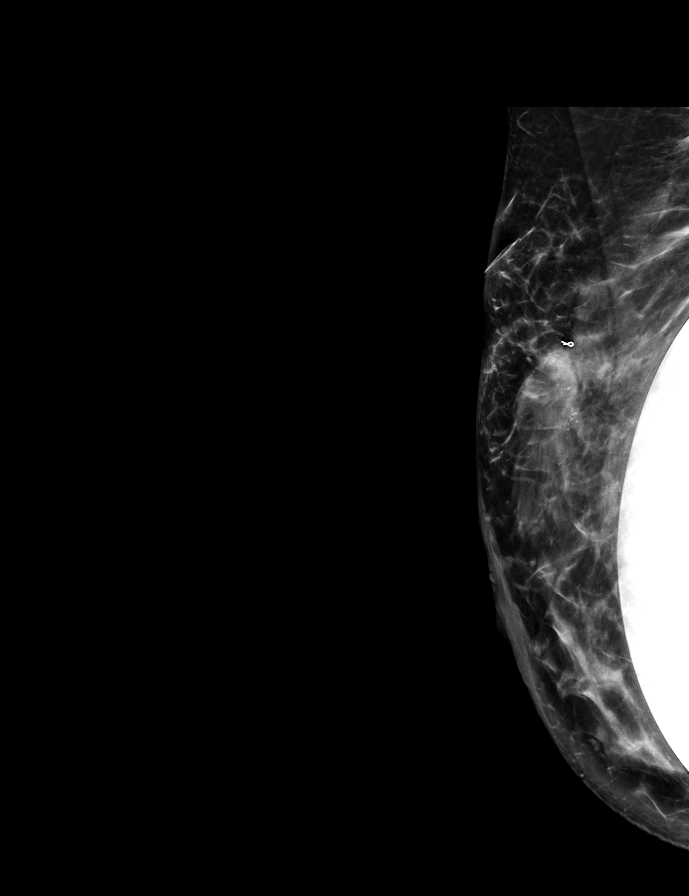

[R MLO synth-2D (2 of 2)]
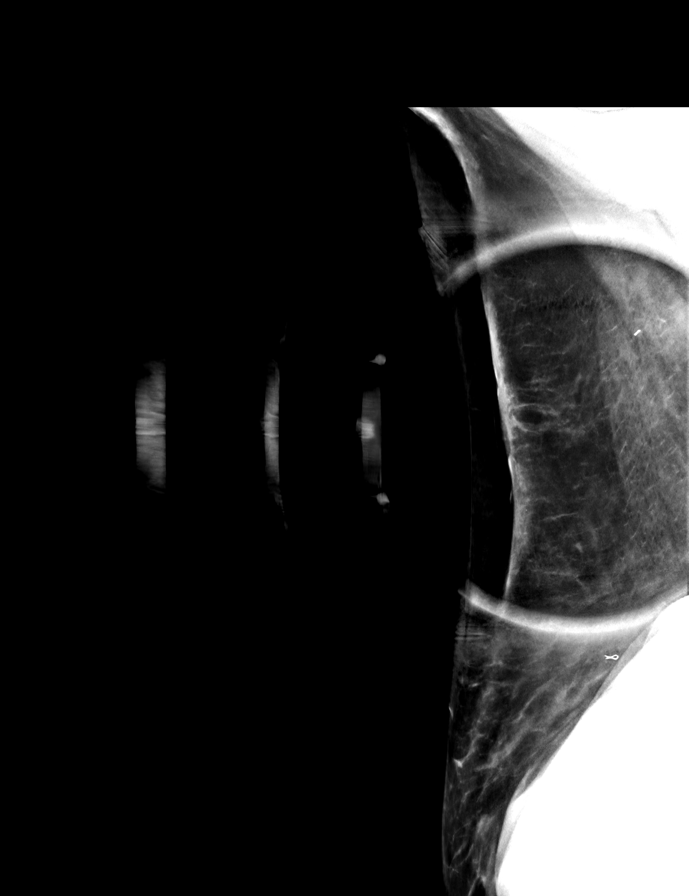

[R CC synth-2D]
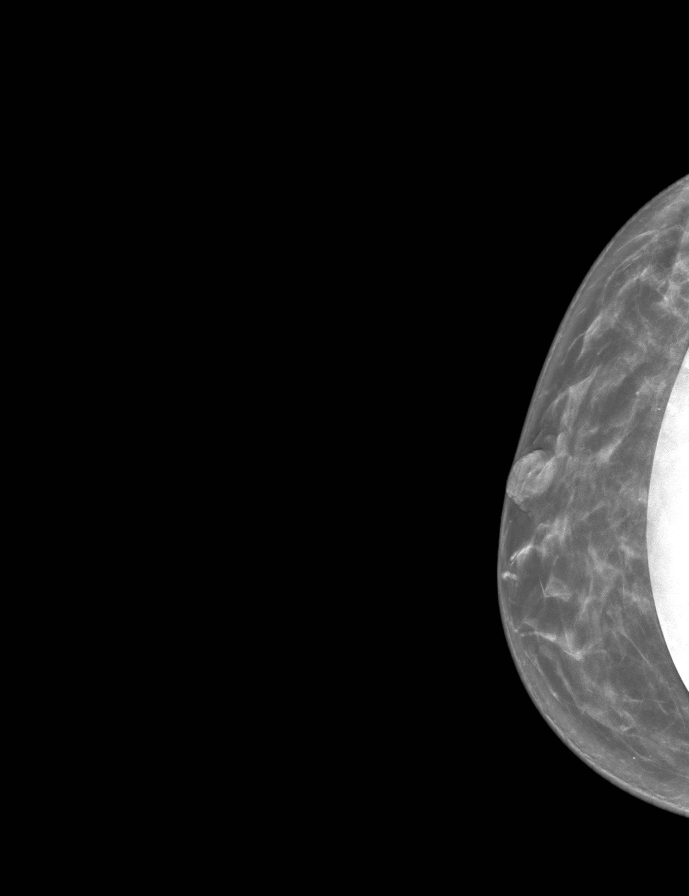

[R CC tomo · 3 of 59 frames shown]
[frame 20/59]
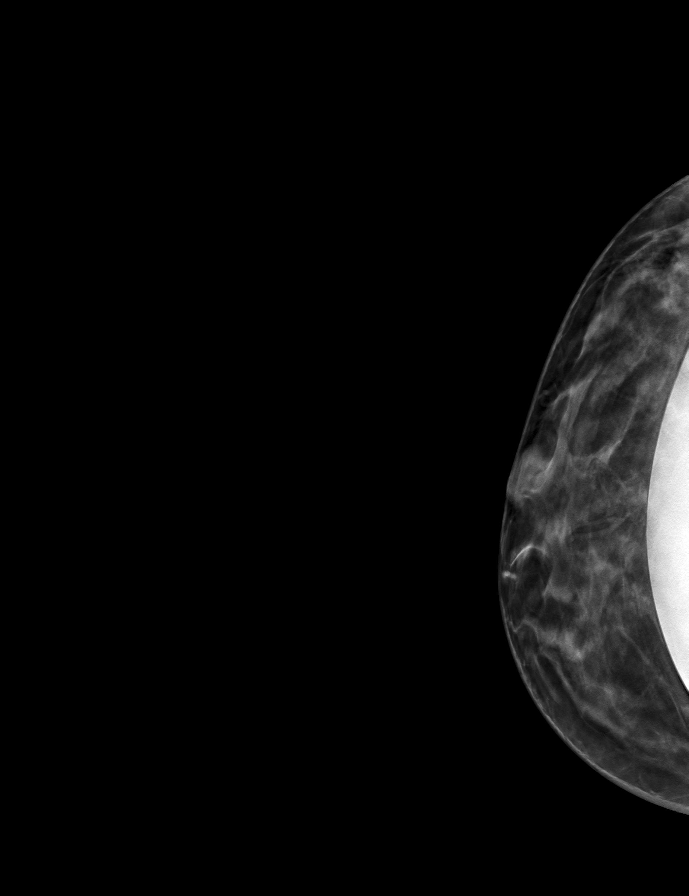
[frame 30/59]
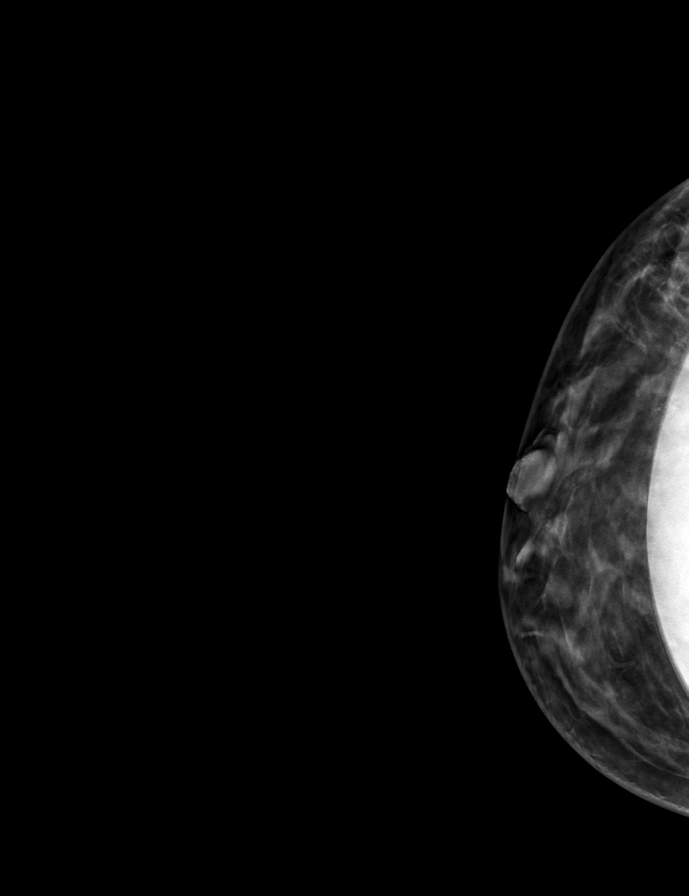
[frame 40/59]
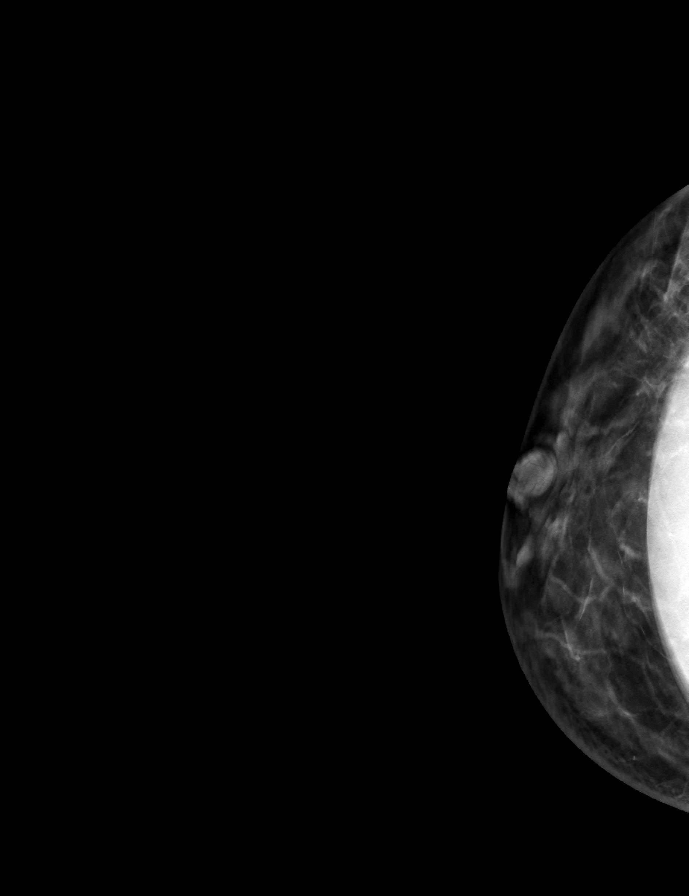

[R MLO tomo (1 of 2) · tomo slice 48/95.0]
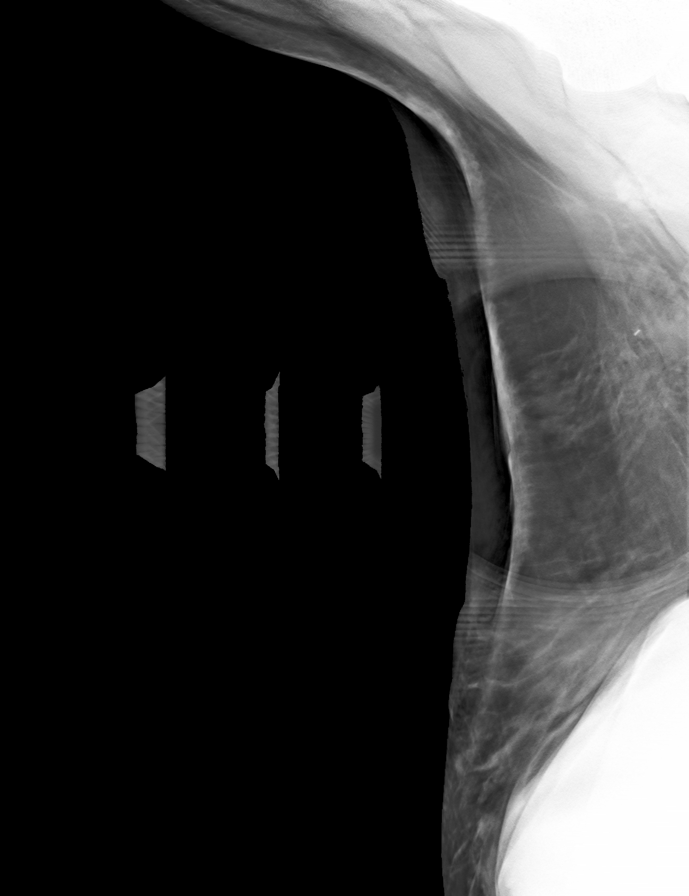

[R MLO tomo (2 of 2) · tomo slice 34/67.0]
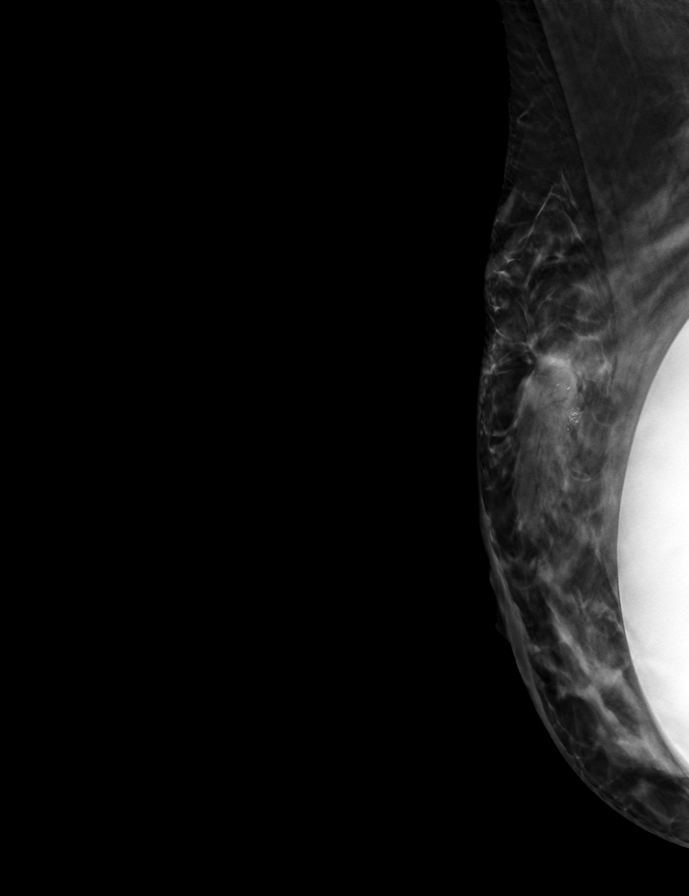

[9 of 19 positions shown; findings below may reference images not displayed]

FINDINGS: Mammographic images were obtained following after sound guided
biopsy of the right breast and axilla. Both post biopsy marking
clips are identified in the expected positions.
IMPRESSION: Appropriate positioning of ribbon shaped and HydroMARK post biopsy
clips in the right breast and axilla.

Final Assessment: Post Procedure Mammograms for Marker Placement

## 2019-11-14 ENCOUNTER — Telehealth: Payer: Self-pay | Admitting: *Deleted

## 2019-11-14 ENCOUNTER — Encounter: Payer: Self-pay | Admitting: *Deleted

## 2019-11-14 NOTE — Telephone Encounter (Signed)
Confirmed [facility] for 11/19/19 at 1215 .  Instructions and contact information given.

## 2019-11-17 ENCOUNTER — Encounter: Payer: Self-pay | Admitting: *Deleted

## 2019-11-17 ENCOUNTER — Other Ambulatory Visit: Payer: Self-pay | Admitting: *Deleted

## 2019-11-17 DIAGNOSIS — Z170 Estrogen receptor positive status [ER+]: Secondary | ICD-10-CM | POA: Insufficient documentation

## 2019-11-19 ENCOUNTER — Inpatient Hospital Stay (HOSPITAL_BASED_OUTPATIENT_CLINIC_OR_DEPARTMENT_OTHER): Payer: 59 | Admitting: Genetic Counselor

## 2019-11-19 ENCOUNTER — Encounter: Payer: Self-pay | Admitting: Hematology

## 2019-11-19 ENCOUNTER — Ambulatory Visit
Admission: RE | Admit: 2019-11-19 | Discharge: 2019-11-19 | Disposition: A | Discharge status: Home / Self Care | Payer: 59 | Source: Ambulatory Visit | Attending: Radiation Oncology | Admitting: Radiation Oncology

## 2019-11-19 ENCOUNTER — Inpatient Hospital Stay: Payer: 59

## 2019-11-19 ENCOUNTER — Ambulatory Visit: Payer: Self-pay | Admitting: Surgery

## 2019-11-19 ENCOUNTER — Ambulatory Visit: Payer: 59 | Attending: Surgery | Admitting: Physical Therapy

## 2019-11-19 ENCOUNTER — Encounter: Payer: Self-pay | Admitting: *Deleted

## 2019-11-19 ENCOUNTER — Other Ambulatory Visit: Payer: Self-pay | Admitting: *Deleted

## 2019-11-19 ENCOUNTER — Encounter: Payer: Self-pay | Admitting: Genetic Counselor

## 2019-11-19 ENCOUNTER — Other Ambulatory Visit: Payer: Self-pay

## 2019-11-19 ENCOUNTER — Telehealth: Payer: Self-pay | Admitting: *Deleted

## 2019-11-19 ENCOUNTER — Encounter: Payer: Self-pay | Admitting: Physical Therapy

## 2019-11-19 ENCOUNTER — Encounter: Payer: Self-pay | Admitting: Radiation Oncology

## 2019-11-19 ENCOUNTER — Inpatient Hospital Stay (HOSPITAL_BASED_OUTPATIENT_CLINIC_OR_DEPARTMENT_OTHER): Payer: 59 | Admitting: Hematology

## 2019-11-19 VITALS — BP 111/71 | HR 81 | Temp 97.7°F | Resp 18 | Ht 62.0 in | Wt 130.8 lb

## 2019-11-19 DIAGNOSIS — Z803 Family history of malignant neoplasm of breast: Secondary | ICD-10-CM | POA: Diagnosis not present

## 2019-11-19 DIAGNOSIS — Z170 Estrogen receptor positive status [ER+]: Secondary | ICD-10-CM

## 2019-11-19 DIAGNOSIS — C773 Secondary and unspecified malignant neoplasm of axilla and upper limb lymph nodes: Secondary | ICD-10-CM | POA: Insufficient documentation

## 2019-11-19 DIAGNOSIS — Z79810 Long term (current) use of selective estrogen receptor modulators (SERMs): Secondary | ICD-10-CM | POA: Insufficient documentation

## 2019-11-19 DIAGNOSIS — F329 Major depressive disorder, single episode, unspecified: Secondary | ICD-10-CM

## 2019-11-19 DIAGNOSIS — R531 Weakness: Secondary | ICD-10-CM

## 2019-11-19 DIAGNOSIS — M546 Pain in thoracic spine: Secondary | ICD-10-CM | POA: Diagnosis present

## 2019-11-19 DIAGNOSIS — Z5111 Encounter for antineoplastic chemotherapy: Secondary | ICD-10-CM | POA: Insufficient documentation

## 2019-11-19 DIAGNOSIS — C50411 Malignant neoplasm of upper-outer quadrant of right female breast: Secondary | ICD-10-CM

## 2019-11-19 DIAGNOSIS — R5383 Other fatigue: Secondary | ICD-10-CM | POA: Insufficient documentation

## 2019-11-19 DIAGNOSIS — F419 Anxiety disorder, unspecified: Secondary | ICD-10-CM

## 2019-11-19 HISTORY — DX: Family history of malignant neoplasm of breast: Z80.3

## 2019-11-19 LAB — CMP (CANCER CENTER ONLY)
ALT: 33 U/L (ref 0–44)
AST: 24 U/L (ref 15–41)
Albumin: 4.2 g/dL (ref 3.5–5.0)
Alkaline Phosphatase: 42 U/L (ref 38–126)
Anion gap: 6 (ref 5–15)
BUN: 12 mg/dL (ref 6–20)
CO2: 27 mmol/L (ref 22–32)
Calcium: 9.2 mg/dL (ref 8.9–10.3)
Chloride: 107 mmol/L (ref 98–111)
Creatinine: 0.72 mg/dL (ref 0.44–1.00)
GFR, Estimated: >60 mL/min (ref >60)
Glucose, Bld: 72 mg/dL (ref 70–99)
Potassium: 4.3 mmol/L (ref 3.5–5.1)
Sodium: 140 mmol/L (ref 135–145)
Total Bilirubin: 0.5 mg/dL (ref 0.3–1.2)
Total Protein: 7.4 g/dL (ref 6.5–8.1)

## 2019-11-19 LAB — CBC WITH DIFFERENTIAL (CANCER CENTER ONLY)
Abs Immature Granulocytes: 0.02 10*3/uL (ref 0.00–0.07)
Basophils Absolute: 0.1 10*3/uL (ref 0.0–0.1)
Basophils Relative: 1 %
Eosinophils Absolute: 0.1 10*3/uL (ref 0.0–0.5)
Eosinophils Relative: 2 %
HCT: 36.3 % (ref 36.0–46.0)
Hemoglobin: 12 g/dL (ref 12.0–15.0)
Immature Granulocytes: 0 %
Lymphocytes Relative: 36 %
Lymphs Abs: 2.3 10*3/uL (ref 0.7–4.0)
MCH: 30.7 pg (ref 26.0–34.0)
MCHC: 33.1 g/dL (ref 30.0–36.0)
MCV: 92.8 fL (ref 80.0–100.0)
Monocytes Absolute: 0.4 10*3/uL (ref 0.1–1.0)
Monocytes Relative: 7 %
Neutro Abs: 3.5 10*3/uL (ref 1.7–7.7)
Neutrophils Relative %: 54 %
Platelet Count: 281 10*3/uL (ref 150–400)
RBC: 3.91 MIL/uL (ref 3.87–5.11)
RDW: 12.8 % (ref 11.5–15.5)
WBC Count: 6.4 10*3/uL (ref 4.0–10.5)
nRBC: 0 % (ref 0.0–0.2)

## 2019-11-19 LAB — GENETIC SCREENING ORDER

## 2019-11-19 NOTE — Progress Notes (Signed)
[facility]   [phone] Fax:([phone] [place] Note   Patient Care Team: Rutherford Guys, MD (Inactive) as PCP - General (Family Medicine) Mansouraty, Telford Nab., MD as Consulting Physician (Gastroenterology) Mauro Kaufmann, RN as Oncology Nurse Navigator Rockwell Germany, RN as Oncology Nurse Navigator Donnie Mesa, MD as Consulting Physician (General Surgery) Truitt Merle, MD as Consulting Physician (Hematology) Kyung Rudd, MD as Consulting Physician (Radiation Oncology)  Date of Service:  11/19/2019   CHIEF COMPLAINTS/PURPOSE OF CONSULTATION:  Malignant neoplasm of upper-outer quadrant of right breast    Oncology History Overview Note  Cancer Staging Malignant neoplasm of upper-outer quadrant of right breast in female, estrogen receptor positive ([place]) Staging form: Breast, AJCC 8th Edition - Clinical stage from 11/12/2019: Stage IIA (cT2, cN1, cM0, G2, ER+, PR+, HER2-) - Signed by Truitt Merle, MD on 11/19/2019    Malignant neoplasm of upper-outer quadrant of right breast in female, estrogen receptor positive ([place])  11/05/2019 Mammogram   IMPRESSION: Large irregular palpable mass 4.7 x 1.4 x 1.4 cm within the upper-outer right breast 11 o'clock position, 4 cm from nipple.  There is a large area of associated coarse heterogeneous calcifications within the mass. Overall findings are concerning for breast carcinoma.   Two cortically thickened right axillary lymph nodes which are indeterminate in etiology.   11/12/2019 Cancer Staging   Staging form: Breast, AJCC 8th Edition - Clinical stage from 11/12/2019: Stage IIA (cT2, cN1, cM0, G2, ER+, PR+, HER2-) - Signed by Truitt Merle, MD on 11/19/2019   11/12/2019 Initial Biopsy   Diagnosis 1. Breast, right, needle core biopsy, right - INVASIVE MAMMARY CARCINOMA, SEE COMMENT. - MAMMARY CARCINOMA IN SITU. 2. Lymph node, needle/core biopsy, right - METASTATIC MAMMARY  CARCINOMA. Microscopic Comment 1. The carcinoma appears grade 2 and measures 16 mm in greatest linear extent. E-cadherin will be ordered. Prognostic makers will be ordered. Dr. Saralyn Pilar has reviewed the case. The case was called to [facility] on 01/13/2020.    1. E-cadherin is strongly positive consistent with a ductal phenotype.   11/12/2019 Receptors her2   1. PROGNOSTIC INDICATORS Results: IMMUNOHISTOCHEMICAL AND MORPHOMETRIC ANALYSIS PERFORMED MANUALLY The tumor cells are NEGATIVE for Her2 (0). Estrogen Receptor: 100%, POSITIVE, STRONG STAINING INTENSITY Progesterone Receptor: 100%, POSITIVE, STRONG STAINING INTENSITY Proliferation Marker Ki67: 5%   11/17/2019 Initial Diagnosis   Malignant neoplasm of upper-outer quadrant of right breast in female, estrogen receptor positive ([place])      HISTORY OF PRESENTING ILLNESS:  Caitlyn Miller 34 y.o. female is a here because of newly diagnosed right breast cancer. The patient presents to the clinic today accompanied by her husband.  She notes 1 year ago she had a breast reconstruction with implant placement. After surgery she noticed a small lump not near incision. She thought is was fat or scar. She truly felt her right breast mass at least 6 months ago, more than a pea size at the time. She has noticed this mass has gotten bigger and also felt mild pain. She denies other breast, skin or nipple change. Mammogram showed mass in right breast and her biopsy showed cancer with LN involvement.   Today she also notes, she has pain in has mid chest and mid back pain, onset 3 years ago) which was previously intermittent and now constant in the past 3 months. She also notes pain and weakness in her b/l arms and legs. Her pain is 7-8/10. She had work up with  MRI in [place] in summer 2021 which was negative. She notes the weakness leads to decreased function and activity. Her weight is mostly stable, but she is eating less. She notes  from these symptoms she feels like she is slowly dying. She notes being anxious, but not depressed. She is on Lexapro which does not help much. She feels her symptoms are not related to her mood but feels she has an illness. This has been since before her cancer diagnosis. She has been seen by Neurology, cardiologist for work up before in [place].   Socially she is from [place] and goes between there and [place]. She is married and has 2 teenage children. Her other family is in [place]. She owns her own business. She does not drink or smoke or do recreational drugs. She has a PMHx of Acid reflux. She is on Lexapro for her Anxiety and depression. She had breast implants placed in 2020. She denies family history of cancer.    GYN HISTORY  Menarchal: 9 LMP: 10/2019 Contraceptive: No HRT: NA G2P2: First at age 89    REVIEW OF SYSTEMS:    Constitutional: Denies fevers, chills or abnormal night sweats Eyes: Denies blurriness of vision, double vision or watery eyes Ears, nose, mouth, throat, and face: Denies mucositis or sore throat Respiratory: Denies cough, dyspnea or wheezes Cardiovascular: Denies palpitation, chest discomfort or lower extremity swelling Gastrointestinal:  Denies nausea, heartburn or change in bowel habits Skin: Denies abnormal skin rashes Lymphatics: Denies new lymphadenopathy or easy bruising MSK: (+) Mid chest and mid back pain (+) pain and weakness in b/l arms and legs  Neurological:Denies numbness (+) Tingling of her toes and fingers, adequate function Behavioral/Psych: Mood is stable, no new changes  Breast: (+) Right breast pain and palpable mass  All other systems were reviewed with the patient and are negative.  MEDICAL HISTORY:  Past Medical History:  Diagnosis Date  . Abnormal Pap smear   . Acid reflux   . Anemia   . Anxiety   . Breast cancer ([place])   . Decreased appetite 10/08  . Depression   . Endometriosis 10/2004  . Epigastric pain 10/2006  .  Family history of breast cancer 11/19/2019  . FH: migraines   . GBS carrier   . H/O amenorrhea 06/2006  . H/O dyspareunia 7/07  . H/O fatigue   . H/O nausea and vomiting 10/2006  . H/O rubella   . H/O varicella   . Hyperemesis arising during pregnancy    First pregnancy  . Irregular periods/menstrual cycles 02/2005  . Pelvic pain 09/2008    SURGICAL HISTORY: Past Surgical History:  Procedure Laterality Date  . AUGMENTATION MAMMAPLASTY Bilateral 4709   silicone   . WISDOM TOOTH EXTRACTION      SOCIAL HISTORY: Social History   Socioeconomic History  . Marital status: Married    Spouse name: Not on file  . Number of children: 2  . Years of education: Not on file  . Highest education level: Not on file  Occupational History  . Not on file  Tobacco Use  . Smoking status: Never Smoker  . Smokeless tobacco: Never Used  Vaping Use  . Vaping Use: Never used  Substance and Sexual Activity  . Alcohol use: No  . Drug use: No  . Sexual activity: Yes    Birth control/protection: None  Other Topics Concern  . Not on file  Social History Narrative  . Not on file   Social Determinants of Health  Financial Resource Strain:   . Difficulty of Paying Living Expenses: Not on file  Food Insecurity:   . Worried About Charity fundraiser in the Last Year: Not on file  . Ran Out of Food in the Last Year: Not on file  Transportation Needs:   . Lack of Transportation (Medical): Not on file  . Lack of Transportation (Non-Medical): Not on file  Physical Activity:   . Days of Exercise per Week: Not on file  . Minutes of Exercise per Session: Not on file  Stress:   . Feeling of Stress : Not on file  Social Connections:   . Frequency of Communication with Friends and Family: Not on file  . Frequency of Social Gatherings with Friends and Family: Not on file  . Attends Religious Services: Not on file  . Active Member of Clubs or Organizations: Not on file  . Attends Theatre manager Meetings: Not on file  . Marital Status: Not on file  Intimate Partner Violence:   . Fear of Current or Ex-Partner: Not on file  . Emotionally Abused: Not on file  . Physically Abused: Not on file  . Sexually Abused: Not on file    FAMILY HISTORY: Family History  Problem Relation Age of Onset  . Migraines Mother   . Hypertension Father   . Breast cancer Paternal Aunt 63  . Colon cancer Neg Hx   . Stomach cancer Neg Hx     ALLERGIES:  is allergic to ibuprofen.  MEDICATIONS:  Current Outpatient Medications  Medication Sig Dispense Refill  . escitalopram (LEXAPRO) 10 MG tablet Take 1 tablet (10 mg total) by mouth at bedtime. 90 tablet 1  . hydrOXYzine (ATARAX/VISTARIL) 25 MG tablet Take 1 tablet (25 mg total) by mouth at bedtime as needed for anxiety. 90 tablet 0  . pantoprazole (PROTONIX) 40 MG tablet Take 1 tablet (40 mg total) by mouth daily as needed. 30 tablet 5   No current facility-administered medications for this visit.    PHYSICAL EXAMINATION: ECOG PERFORMANCE STATUS: 1 - Symptomatic but completely ambulatory  Vitals:   11/19/19 1235  BP: 111/71  Pulse: 81  Resp: 18  Temp: 97.7 F (36.5 C)  SpO2: 99%   Filed Weights   11/19/19 1235  Weight: 130 lb 12.8 oz (59.3 kg)    GENERAL:alert, no distress and comfortable SKIN: skin color, texture, turgor are normal, no rashes or significant lesions EYES: normal, Conjunctiva are pink and non-injected, sclera clear  NECK: supple, thyroid normal size, non-tender, without nodularity LYMPH:  no palpable lymphadenopathy in the cervical, axillary  LUNGS: clear to auscultation and percussion with normal breathing effort HEART: regular rate & rhythm and no murmurs and no lower extremity edema ABDOMEN:abdomen soft, non-tender and normal bowel sounds Musculoskeletal:no cyanosis of digits and no clubbing (+)Minimal Posterior cervical to upper thoracic spine tenderness NEURO: alert & oriented x 3 with fluent  speech, no focal motor/sensory deficits BREAST: S/p B/l breast implant reconstruction surgery (+)Palpable 1.5-2cm right axillary LN.(+)Right breast skin ecchymosis (+) Palpable 5x2.5cm mass in UOQ of right breast, 4cm from the nipple.   LABORATORY DATA:  I have reviewed the data as listed CBC Latest Ref Rng & Units 11/19/2019 05/01/2017 04/12/2017  WBC 4.0 - 10.5 K/uL 6.4 6.4 7.8  Hemoglobin 12.0 - 15.0 g/dL 12.0 12.8 12.3  Hematocrit 36 - 46 % 36.3 39.5 35.8  Platelets 150 - 400 K/uL 281 273 261    CMP Latest Ref Rng & Units 11/19/2019  05/01/2017 04/12/2017  Glucose 70 - 99 mg/dL 72 82 -  BUN 6 - 20 mg/dL 12 7 -  Creatinine 0.44 - 1.00 mg/dL 0.72 0.62 -  Sodium 135 - 145 mmol/L 140 142 -  Potassium 3.5 - 5.1 mmol/L 4.3 4.5 -  Chloride 98 - 111 mmol/L 107 104 -  CO2 22 - 32 mmol/L 27 23 -  Calcium 8.9 - 10.3 mg/dL 9.2 9.8 9.8  Total Protein 6.5 - 8.1 g/dL 7.4 7.4 -  Total Bilirubin 0.3 - 1.2 mg/dL 0.5 0.4 -  Alkaline Phos 38 - 126 U/L 42 40 -  AST 15 - 41 U/L 24 20 -  ALT 0 - 44 U/L 33 23 -     RADIOGRAPHIC STUDIES: I have personally reviewed the radiological images as listed and agreed with the findings in the report. US BREAST LTD UNI RIGHT INC AXILLA  Result Date: 11/05/2019 CLINICAL DATA:  Patient presents for palpable abnormality within the upper outer right breast. EXAM: DIGITAL DIAGNOSTIC BILATERAL MAMMOGRAM WITH IMPLANTS AND TOMO ULTRASOUND RIGHT BREAST The patient has retropectoral implants. Standard and implant displaced views were performed. COMPARISON:  Previous exam(s). ACR Breast Density Category c: The breast tissue is heterogeneously dense, which may obscure small masses. FINDINGS: Underlying the palpable marker within the upper-outer right breast is a large irregular spiculated mass, partially obscured due to the adjacent dense fibroglandular tissue. There are associated coarse heterogeneous calcifications within the upper-outer right breast. Calcifications measure an  area of 3.8 x 3.8 cm. No suspicious findings within the left breast. On physical exam, there is a firm mass within the upper-outer right breast. Targeted ultrasound is performed, showing a large irregular hypoechoic mixed echogenicity mass within the upper-outer right breast at the site of palpable concern. Given the size and mixed echogenicity, this is somewhat difficult to measure in entirety however within the right breast 11 o'clock position 4 cm from nipple there is a 4.7 x 1.4 x 1.4 cm definable mass. There are 2 adjacent cortically thickened nodes within the right axilla measuring up to 4 mm in thickness. IMPRESSION: Large irregular palpable mass within the upper-outer right breast 11 o'clock position. There is a large area of associated coarse heterogeneous calcifications within the mass. Overall findings are concerning for breast carcinoma. Two cortically thickened right axillary lymph nodes which are indeterminate in etiology. RECOMMENDATION: Ultrasound-guided core needle biopsy of the large palpable mass upper outer right breast. Ultrasound-guided core needle biopsy of 1 of the cortically thickened right axillary lymph nodes. After the ultrasound-guided biopsies, recommend further evaluation with bilateral breast MRI given the large nature of the mass, associated calcifications and dense breast tissue. I have discussed the findings and recommendations with the patient. If applicable, a reminder letter will be sent to the patient regarding the next appointment. BI-RADS CATEGORY  5: Highly suggestive of malignancy. Electronically Signed   By: Lovey Newcomer M.D.   On: 11/05/2019 09:18   MM DIAG BREAST W/IMPLANT TOMO BILATERAL  Result Date: 11/05/2019 CLINICAL DATA:  Patient presents for palpable abnormality within the upper outer right breast. EXAM: DIGITAL DIAGNOSTIC BILATERAL MAMMOGRAM WITH IMPLANTS AND TOMO ULTRASOUND RIGHT BREAST The patient has retropectoral implants. Standard and implant displaced  views were performed. COMPARISON:  Previous exam(s). ACR Breast Density Category c: The breast tissue is heterogeneously dense, which may obscure small masses. FINDINGS: Underlying the palpable marker within the upper-outer right breast is a large irregular spiculated mass, partially obscured due to the adjacent dense fibroglandular tissue. There are  associated coarse heterogeneous calcifications within the upper-outer right breast. Calcifications measure an area of 3.8 x 3.8 cm. No suspicious findings within the left breast. On physical exam, there is a firm mass within the upper-outer right breast. Targeted ultrasound is performed, showing a large irregular hypoechoic mixed echogenicity mass within the upper-outer right breast at the site of palpable concern. Given the size and mixed echogenicity, this is somewhat difficult to measure in entirety however within the right breast 11 o'clock position 4 cm from nipple there is a 4.7 x 1.4 x 1.4 cm definable mass. There are 2 adjacent cortically thickened nodes within the right axilla measuring up to 4 mm in thickness. IMPRESSION: Large irregular palpable mass within the upper-outer right breast 11 o'clock position. There is a large area of associated coarse heterogeneous calcifications within the mass. Overall findings are concerning for breast carcinoma. Two cortically thickened right axillary lymph nodes which are indeterminate in etiology. RECOMMENDATION: Ultrasound-guided core needle biopsy of the large palpable mass upper outer right breast. Ultrasound-guided core needle biopsy of 1 of the cortically thickened right axillary lymph nodes. After the ultrasound-guided biopsies, recommend further evaluation with bilateral breast MRI given the large nature of the mass, associated calcifications and dense breast tissue. I have discussed the findings and recommendations with the patient. If applicable, a reminder letter will be sent to the patient regarding the next  appointment. BI-RADS CATEGORY  5: Highly suggestive of malignancy. Electronically Signed   By: Lovey Newcomer M.D.   On: 11/05/2019 09:18   [place] AXILLARY NODE CORE BIOPSY RIGHT  Addendum Date: 11/14/2019   ADDENDUM REPORT: 11/14/2019 08:16 ADDENDUM: Pathology revealed GRADE II INVASIVE MAMMARY CARCINOMA, MAMMARY CARCINOMA IN SITU of the RIGHT breast, 11 o'clock, upper outer quadrant. E-cadherin is strongly positive consistent with a ductal phenotype. This was found to be concordant by Dr. Kristopher Oppenheim. 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 This was found to be concordant by Dr. Kristopher Oppenheim. Pathology results were discussed with the patient and her husband by telephone. The patient reported doing well after the biopsies with tenderness at the sites. Post biopsy instructions and care were reviewed and questions were answered. The patient was encouraged to call [facility] for any additional concerns. The patient was referred to [facility] on November 19, 2019. The patient will need bilateral breast MRI given age and breast density, and extent of disease. Pathology results reported by Stacie Acres RN on 11/14/2019. Electronically Signed   By: Kristopher Oppenheim M.D.   On: 11/14/2019 08:16   Result Date: 11/14/2019 CLINICAL DATA:  34 year old female with a highly suspicious right breast mass and indeterminate right axillary lymphadenopathy. EXAM: ULTRASOUND GUIDED RIGHT BREAST CORE NEEDLE BIOPSY ULTRASOUND-GUIDED RIGHT AXILLARY CORE NEEDLE BIOPSY COMPARISON:  Previous exam(s). PROCEDURE: I met with the patient and we discussed the procedure of ultrasound-guided biopsy, including benefits and alternatives. We discussed the high likelihood of a successful procedure. We discussed the risks of the procedure, including infection, bleeding, tissue injury, clip migration, and  inadequate sampling. Informed written consent was given. The usual time-out protocol was performed immediately prior to the procedure. Lesion quadrant: Upper outer quadrant Using sterile technique and 1% Lidocaine as local anesthetic, under direct ultrasound visualization, a 14 gauge spring-loaded device was used to perform biopsy of a spiculated mass at the 11 o'clock position using a lateral approach. At the conclusion of the procedure  ribbon shaped tissue marker clip was deployed into the biopsy cavity. Using sterile technique and 1% Lidocaine as local anesthetic, under direct ultrasound visualization, a 14 gauge spring-loaded device was used to perform biopsy of a right axillary lymph node using a inferior approach. At the conclusion of the procedure [facility] tissue marker clip was deployed into the biopsy cavity. Follow up 2 view mammogram was performed and dictated separately. IMPRESSION: Ultrasound guided biopsy of the right breast and axilla. No apparent complications. Electronically Signed: By: Kristopher Oppenheim M.D. On: 11/12/2019 11:35   MM CLIP PLACEMENT RIGHT  Result Date: 11/12/2019 CLINICAL DATA:  34 year old female status post ultrasound-guided biopsy of the right breast and axilla. EXAM: DIAGNOSTIC RIGHT MAMMOGRAM POST ULTRASOUND BIOPSY COMPARISON:  Previous exam(s). FINDINGS: Mammographic images were obtained following after sound guided biopsy of the right breast and axilla. Both post biopsy marking clips are identified in the expected positions. IMPRESSION: Appropriate positioning of ribbon shaped and HydroMARK post biopsy clips in the right breast and axilla. Final Assessment: Post Procedure Mammograms for Marker Placement Electronically Signed   By: Kristopher Oppenheim M.D.   On: 11/12/2019 11:36   [place] RT BREAST BX W LOC DEV 1ST LESION IMG BX SPEC US GUIDE  Addendum Date: 11/14/2019   ADDENDUM REPORT: 11/14/2019 08:16 ADDENDUM: Pathology revealed GRADE II INVASIVE MAMMARY CARCINOMA, MAMMARY  CARCINOMA IN SITU of the RIGHT breast, 11 o'clock, upper outer quadrant. E-cadherin is strongly positive consistent with a ductal phenotype. This was found to be concordant by Dr. Kristopher Oppenheim. 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 This was found to be concordant by Dr. Kristopher Oppenheim. Pathology results were discussed with the patient and her husband by telephone. The patient reported doing well after the biopsies with tenderness at the sites. Post biopsy instructions and care were reviewed and questions were answered. The patient was encouraged to call [facility] for any additional concerns. The patient was referred to [facility] on November 19, 2019. The patient will need bilateral breast MRI given age and breast density, and extent of disease. Pathology results reported by Stacie Acres RN on 11/14/2019. Electronically Signed   By: Kristopher Oppenheim M.D.   On: 11/14/2019 08:16   Result Date: 11/14/2019 CLINICAL DATA:  34 year old female with a highly suspicious right breast mass and indeterminate right axillary lymphadenopathy. EXAM: ULTRASOUND GUIDED RIGHT BREAST CORE NEEDLE BIOPSY ULTRASOUND-GUIDED RIGHT AXILLARY CORE NEEDLE BIOPSY COMPARISON:  Previous exam(s). PROCEDURE: I met with the patient and we discussed the procedure of ultrasound-guided biopsy, including benefits and alternatives. We discussed the high likelihood of a successful procedure. We discussed the risks of the procedure, including infection, bleeding, tissue injury, clip migration, and inadequate sampling. Informed written consent was given. The usual time-out protocol was performed immediately prior to the procedure. Lesion quadrant: Upper outer quadrant Using sterile technique and 1% Lidocaine as local anesthetic, under direct ultrasound visualization, a 14 gauge spring-loaded device was used to  perform biopsy of a spiculated mass at the 11 o'clock position using a lateral approach. At the conclusion of the procedure ribbon shaped tissue marker clip was deployed into the biopsy cavity. Using sterile technique and 1% Lidocaine as local anesthetic, under direct ultrasound visualization, a 14 gauge spring-loaded device was used to perform biopsy of a right axillary lymph node using a inferior approach. At the conclusion of the procedure [facility] tissue marker clip was deployed into  the biopsy cavity. Follow up 2 view mammogram was performed and dictated separately. IMPRESSION: Ultrasound guided biopsy of the right breast and axilla. No apparent complications. Electronically Signed: By: Kristopher Oppenheim M.D. On: 11/12/2019 11:35    ASSESSMENT & PLAN:  Caitlyn Miller is a 34 y.o. [name]  female with a history of Acid Reflux   1. Malignant neoplasm of upper-outer quadrant of right breast, Stage IIA, c(T2N1M0), ER+/PR+/HER2-, Grade II -We discussed her image findings and the biopsy results in great details. She has a 4.7cm right breast mass at 11:00 position and two abnormal right axillary LN. 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  -I discussed with low Ki 67 and strongly ER/PR positive disease, her cancer is likely low risk disease, but clinically her presented with rapid growth of her tumor it appears aggressive clinically.  -Given her positive LN, I recommend breast MRI and PET scan to evaluate for distant metastasis. She is agreeable.  -I discussed surgery is the only way to cure her cancer. She will discuss surgery options such as mastectomy or lumpectomy with Dr Georgette Dover today.  -I recommend mammaprint on her biopsy sample to determine her risk of recurrence and benefit of chemo. If high risk disease chemotherapy will be recommended, likely before surgery, to downstage her cancer. She is agreeable. -If she  proceed with surgery first and her surgical path shows 4 or more positive LN, adjuvant chemo will be recommended after surgery. -After surgery and chemotherapy, Adjuvant Radiation is recommended given her positive LN involvement and reduce risk of local recurrence. She will discuss this with Dr Lisbeth Renshaw today.  -Given her ER/PR positive disease, I recommend adjuvant antiestrogen therapy with Tamoxifen or AI and Ovarian suppression (by injection or BSO) to reduce her risk of breast recurrence. Given her young age and node positive disease, I strongly encouraged her to consider ovarian suppression. Will further discuss before she starts adjuvant endocrine therapy.  -Will finalize treatment plan after the work up.  -Labs reviewed, CBC and CMP WNL. Initial Physical shows 5x2cm right breast mass and 2cm axillary LN.  -F/u open   2. Generalized Weakness, body pain, Fatigue  -Pt notes for the past 3 years she has had mid chest and mid back pain. Previously intermittent, now constant. She also notes b/l arm and leg weakness and pain along with tingling of her toes and fingers.  -This has lead to fatigue, low appetite, decreased daily function/activity.  -Pt notes prior workup for this in [place] was negative with Neurologist and cardiologist. They said this is related to stress, anxiety or depression. Pt feels she has an illness causing this instead.  -Will obtain PET for her breast cancer workup. I have low suspicion this is related to her breast cancer, but could be focal weakness.  -She has adequate peripheral strength on exam, no significant tenderness of spine (11/19/19)   3. Anxiety, depression  -Pt notes feeling more anxious (due to health before breast cancer), than depressed.  -She is on Lexapro, which helps some. She is not interested in more SSRI and may stop Lexapro given drowsiness. I discussed she can wean off over 3-4 weeks if she plans to stop it.    4. Genetic Testing  -Although she has no  family history of cancer, her young age makes her eligible for genetic testing. She is agreeable.    5. Social Support  -She is from [place] and lives both  there and in [place] with her husband. She has 2 teenage children. The rest of her family is in [place].     PLAN:  -Breast MRI and PET in 1-2 weeks -Send Mammaprint on biopsy sample to determine if she will need neoadjuvant chemo  -Send urgent genetic referral  -F/u based on the above work up    No orders of the defined types were placed in this encounter.   All questions were answered. The patient knows to call the clinic with any problems, questions or concerns. The total time spent in the appointment was 60 minutes.     Truitt Merle, MD 11/19/2019 9:44 PM  I, Joslyn Devon, am acting as scribe for Truitt Merle, MD.   I have reviewed the above documentation for accuracy and completeness, and I agree with the above.

## 2019-11-19 NOTE — Telephone Encounter (Signed)
Per Dr. Burr Medico ordered mammaprint (core). Faxed requisition to pathology and [facility].

## 2019-11-19 NOTE — Progress Notes (Signed)
REFERRING PROVIDER: Truitt Merle, MD [address] [place],  [place] [id number]  PRIMARY PROVIDER:  Rutherford Guys, MD (Inactive)  PRIMARY REASON FOR VISIT:  1. Malignant neoplasm of upper-outer quadrant of right breast in female, estrogen receptor positive ([place])   2. Family history of breast cancer    HISTORY OF PRESENT ILLNESS:   Caitlyn Miller, a 34 y.o. female, was seen for a [facility] cancer genetics consultation during the breast multidisciplinary clinic at the request of Dr. Burr Medico due to a personal and family history of breast cancer.  Caitlyn Miller presents to clinic today with her husband to discuss the possibility of a hereditary predisposition to cancer, to discuss genetic testing, and to further clarify her future cancer risks, as well as potential cancer risks for family members.   In 2021, at the age of 76, Caitlyn Miller was diagnosed with invasive ductal carcinoma of the right breast. The current plan includes breast MRI and PET as well as mammaprint to determine role for chemotherapy.  Surgery is to be determined.   CANCER HISTORY:  Oncology History Overview Note  Cancer Staging Malignant neoplasm of upper-outer quadrant of right breast in female, estrogen receptor positive ([place]) Staging form: Breast, AJCC 8th Edition - Clinical stage from 11/12/2019: Stage IIA (cT2, cN41m, cM0, G2, ER+, PR+, HER2-) - Signed by FTruitt Merle MD on 11/18/2019    Malignant neoplasm of upper-outer quadrant of right breast in female, estrogen receptor positive ([place]  11/05/2019 Mammogram   IMPRESSION: Large irregular palpable mass 4.7 x 1.4 x 1.4 cm within the upper-outer right breast 11 o'clock position, 4 cm from nipple.  There is a large area of associated coarse heterogeneous calcifications within the mass. Overall findings are concerning for breast carcinoma.   Two cortically thickened right axillary lymph nodes which are indeterminate in etiology.   11/12/2019 Cancer Staging    Staging form: Breast, AJCC 8th Edition - Clinical stage from 11/12/2019: Stage IIA (cT2, cN172m cM0, G2, ER+, PR+, HER2-) - Signed by FeTruitt MerleMD on 11/18/2019   11/12/2019 Initial Biopsy   Diagnosis 1. Breast, right, needle core biopsy, right - INVASIVE MAMMARY CARCINOMA, SEE COMMENT. - MAMMARY CARCINOMA IN SITU. 2. Lymph node, needle/core biopsy, right - METASTATIC MAMMARY CARCINOMA. Microscopic Comment 1. The carcinoma appears grade 2 and measures 16 mm in greatest linear extent. E-cadherin will be ordered. Prognostic makers will be ordered. Dr. PaSaralyn Pilaras reviewed the case. The case was called to [facility] 01/13/2020.    1. E-cadherin is strongly positive consistent with a ductal phenotype.   11/12/2019 Receptors her2   1. PROGNOSTIC INDICATORS Results: IMMUNOHISTOCHEMICAL AND MORPHOMETRIC ANALYSIS PERFORMED MANUALLY The tumor cells are NEGATIVE for Her2 (0). Estrogen Receptor: 100%, POSITIVE, STRONG STAINING INTENSITY Progesterone Receptor: 100%, POSITIVE, STRONG STAINING INTENSITY Proliferation Marker Ki67: 5%   11/17/2019 Initial Diagnosis   Malignant neoplasm of upper-outer quadrant of right breast in female, estrogen receptor positive ([facility] [place]    RISK FACTORS:  Menarche was at age [age] First live birth at age [age] OCP use for approximately 0 years.  Ovaries intact: yes.  Hysterectomy: no.  Menopausal status: premenopausal.  HRT use: 0 years. Colonoscopy: no; not examined. Mammogram within the last year: yes. Up to date with pelvic exams: yes.  Past Medical History:  Diagnosis Date   Abnormal Pap smear    Acid reflux    Anemia    Anxiety    [place]ast cancer ([facility]  Decreased appetite 10/08   Depression    Endometriosis 10/2004   Epigastric pain 10/2006   Family history of breast cancer 11/19/2019   FH: migraines    GBS carrier    H/O amenorrhea 06/2006   H/O dyspareunia 7/07   H/O fatigue    H/O nausea and  vomiting 10/2006   H/O rubella    H/O varicella    Hyperemesis arising during pregnancy    First pregnancy   Irregular periods/menstrual cycles 02/2005   Pelvic pain 09/2008    Past Surgical History:  Procedure Laterality Date   AUGMENTATION MAMMAPLASTY Bilateral 7169   silicone    WISDOM TOOTH EXTRACTION      Social History   Socioeconomic History   Marital status: Married    Spouse name: Not on file   Number of children: 2   Years of education: Not on file   Highest education level: Not on file  Occupational History   Not on file  Tobacco Use   Smoking status: Never Smoker   Smokeless tobacco: Never Used  Vaping Use   Vaping Use: Never used  Substance and Sexual Activity   Alcohol use: No   Drug use: No   Sexual activity: Yes    Birth control/protection: None  Other Topics Concern   Not on file  Social History Narrative   Not on file   FAMILY HISTORY:  We obtained a detailed, 4-generation family history.  Significant diagnoses are listed below: Family History  Problem Relation Age of Onset   Breast cancer Paternal Aunt 43    Caitlyn Miller has two children, ages 31 and 42.  Caitlyn Miller has two brothers and one sister, all without a history of cancer.  Caitlyn Miller mother is 61 years old and does not have a history of cancer.  No maternal family history of cancer was reported.  Caitlyn Miller's father passed away at age 45.  Caitlyn Miller's paternal aunt with diagnosed with breast cancer at age 58.  No other paternal family history of cancer.   Caitlyn Miller is unaware of previous family history of genetic testing for hereditary cancer risks. Patient's maternal ancestors are of [name] descent, and paternal ancestors are of [name] descent. There is no reported Ashkenazi Jewish ancestry. There is no known consanguinity.  GENETIC COUNSELING ASSESSMENT: Caitlyn Miller is a 34 y.o. female with a personal and family history of breast cancer which is somewhat  suggestive of a hereditary cancer syndrome and predisposition to cancer given her age of diagnosis. We, therefore, discussed and recommended the following at today's visit.   DISCUSSION: We discussed that 5 - 10% of cancer is hereditary, with most cases of hereditary breast cancer associated with mutations in BRCA1/2.  There are other genes that can be associated with hereditary breast cancer syndromes.  Type of cancer risk and level of risk are gene-specific.  We discussed that testing is beneficial for several reasons including knowing how to follow individuals after completing their treatment, identifying whether potential treatment options would be beneficial, and understanding if other family members could be at risk for cancer and allowing them to undergo genetic testing.   We reviewed the characteristics, features and inheritance patterns of hereditary cancer syndromes. We also discussed genetic testing, including the appropriate family members to test, the process of testing, insurance coverage and turn-around-time for results. We discussed the implications of a negative, positive and/or variant of uncertain significant result. In order to get genetic test results in a timely  manner so that Ms. Rinck can use these genetic test results for surgical decisions, we recommended Ms. Tewksbury pursue genetic testing for the STAT Breast Cancer Panel.  The STAT Breast cancer panel offered by [facility] includes sequencing and rearrangement analysis for the following 9 genes:  ATM, BRCA1, BRCA2, CDH1, CHEK2, PALB2, PTEN, STK11 and TP53.  Once complete, we recommend Ms. Kalil pursue reflex genetic testing to a more comprehensive gene panel.   Ms. Borbon  was offered a common hereditary cancer panel (48 genes) and an expanded pan-cancer panel (85 genes). Ms. Quant was informed of the benefits and limitations of each panel, including that expanded pan-cancer panels contain several preliminary evidence genes that  do not have clear management guidelines at this point in time.  We also discussed that as the number of genes included on a panel increases, the chances of variants of uncertain significance increases.  After considering the benefits and limitations of each gene panel, Ms. Fasnacht elected to have an expanded pan-cancer panel through [facility].  The Multi-Cancer Panel offered by [facility] includes sequencing and/or deletion duplication testing of the following 85 genes: AIP, ALK, APC, ATM, AXIN2,BAP1,  BARD1, BLM, BMPR1A, BRCA1, BRCA2, BRIP1, CASR, CDC73, CDH1, CDK4, CDKN1B, CDKN1C, CDKN2A (p14ARF), CDKN2A (p16INK4a), CEBPA, CHEK2, CTNNA1, DICER1, DIS3L2, EGFR (c.2369C>T, p.Thr790Met variant only), EPCAM (Deletion/duplication testing only), FH, FLCN, GATA2, GPC3, GREM1 (Promoter region deletion/duplication testing only), HOXB13 (c.251G>A, p.Gly84Glu), HRAS, KIT, MAX, MEN1, MET, MITF (c.952G>A, p.Glu318Lys variant only), MLH1, MSH2, MSH3, MSH6, MUTYH, NBN, NF1, NF2, NTHL1, PALB2, PDGFRA, PHOX2B, PMS2, POLD1, POLE, POT1, PRKAR1A, PTCH1, PTEN, RAD50, RAD51C, RAD51D, RB1, RECQL4, RET, RNF43, RUNX1, SDHAF2, SDHA (sequence changes only), SDHB, SDHC, SDHD, SMAD4, SMARCA4, SMARCB1, SMARCE1, STK11, SUFU, TERC, TERT, TMEM127, TP53, TSC1, TSC2, VHL, WRN and WT1.   Based on Ms. Stockburger's personal history of cancer at age 31, she meets medical criteria for genetic testing. Despite that she meets criteria, she may still have an out of pocket cost. We discussed that if her out of pocket cost for testing is over $100, the laboratory will call and confirm whether she wants to proceed with testing.  If the out of pocket cost of testing is less than $100 she will be billed by the genetic testing laboratory.     PLAN: After considering the risks, benefits, and limitations, Ms. Uncapher provided informed consent to pursue genetic testing and the blood sample was sent to [facility] for analysis of the STAT + Multi-Cancer  Panel. Results should be available within approximately 1-2 weeks' time, at which point they will be disclosed by telephone to Ms. Ferraiolo, as will any additional recommendations warranted by these results. Ms. Natividad will receive a summary of her genetic counseling visit and a copy of her results once available. This information will also be available in Epic.   Lastly, we encouraged Ms. Minetti to remain in contact with cancer genetics annually so that we can continuously update the family history and inform her of any changes in cancer genetics and testing that may be of benefit for this family.   Ms. Taormina's questions were answered to her satisfaction today. Our contact information was provided should additional questions or concerns arise. Thank you for the referral and allowing [place] to share in the care of your patient.   Jamarea Selner M. Joette Catching, [place], [place] Film/video [email or web address] (P) [phone]  The patient was seen for a total of 20 minutes in face-to-face genetic counseling.  This patient was discussed with Drs. Magrinat, Gudena and/or  Burr Medico who agrees with the above.    _______________________________________________________________________ For Office Staff:  Number of people involved in session: 1 Was an Intern/ student involved with case: no

## 2019-11-19 NOTE — Therapy (Signed)
[place], [place], [address] Phone: [phone]   Fax:  [phone]  Physical Therapy Evaluation  Patient Details  Name: Caitlyn Miller MRN: [id number] Date of Birth: 02/04/1985 Referring Provider (PT): Dr. Donnie Mesa   Encounter Date: 11/19/2019   PT End of Session - 11/19/19 1940    Visit Number 1    Number of Visits 2    Date for PT Re-Evaluation 05/18/20    PT Start Time 1424    PT Stop Time 1453    PT Time Calculation (min) 29 min    Behavior During Therapy [facility] for tasks assessed/performed           Past Medical History:  Diagnosis Date  . Abnormal Pap smear   . Acid reflux   . Anemia   . Anxiety   . Breast cancer ([place] )   . Decreased appetite 10/08  . Depression   . Endometriosis 10/2004  . Epigastric pain 10/2006  . Family history of breast cancer 11/19/2019  . FH: migraines   . GBS carrier   . H/O amenorrhea 06/2006  . H/O dyspareunia 7/07  . H/O fatigue   . H/O nausea and vomiting 10/2006  . H/O rubella   . H/O varicella   . Hyperemesis arising during pregnancy    First pregnancy  . Irregular periods/menstrual cycles 02/2005  . Pelvic pain 09/2008    Past Surgical History:  Procedure Laterality Date  . AUGMENTATION MAMMAPLASTY Bilateral 7416   silicone   . WISDOM TOOTH EXTRACTION      There were no vitals filed for this visit.    Subjective Assessment - 11/19/19 1929    Subjective Patient reports she is here today to be seen by her medical team for her newly diagnosed right breast cancer.    Patient is accompained by: Family member    Pertinent History Patient was diagnosed on 11/05/2019 with right grade II invasive ductal carcinoma breast cancer. It measures 4.7 cm and is located in the upper outer quadrant. It is ER/PR positive and HER2 negative with a Ki67 of 5%. She has a biopsied positive axillary lymph node.    Patient Stated Goals Reduce lymphedema risk and  learn post op shoulder ROM HEP    Currently in Pain? Yes    Pain Score 8     Pain Location Back    Pain Orientation Upper    Pain Descriptors / Indicators Aching    Pain Type Acute pain    Pain Onset More than a month ago    Pain Frequency Intermittent    Aggravating Factors  Worse at night    Pain Relieving Factors Nothing              [facility] PT Assessment - 11/19/19 0001      Assessment   Medical Diagnosis Right breast cancer    Referring Provider (PT) Dr. Donnie Mesa    Onset Date/Surgical Date 11/05/19    Hand Dominance Right    Prior Therapy none      Precautions   Precautions Other (comment)    Precaution Comments active cancer      Restrictions   Weight Bearing Restrictions No      Balance Screen   Has the patient fallen in the past 6 months No    Has the patient had a decrease in activity level because of a fear of falling?  No    Is the patient reluctant to leave  their home because of a fear of falling?  No      Home Social worker Private residence    Living Arrangements Spouse/significant other;Children   Husband, 69 and 46 y.o. kids   Available Help at Discharge Family      Prior Function   Level of Independence Independent    Vocation Unemployed    Leisure She does not exercise      Cognition   Overall Cognitive Status Within Functional Limits for tasks assessed      Posture/Postural Control   Posture/Postural Control No significant limitations      ROM / Strength   AROM / PROM / Strength AROM;Strength      AROM   Overall AROM Comments Left cervical active rotation limited 25%; others are WNL    AROM Assessment Site Shoulder    Right/Left Shoulder Right;Left    Right Shoulder Extension 38 Degrees    Right Shoulder Flexion 147 Degrees    Right Shoulder ABduction 148 Degrees    Right Shoulder Internal Rotation 70 Degrees    Right Shoulder External Rotation 92 Degrees    Left Shoulder Extension 46 Degrees    Left Shoulder  Flexion 143 Degrees    Left Shoulder ABduction 153 Degrees    Left Shoulder Internal Rotation 46 Degrees    Left Shoulder External Rotation 90 Degrees      Strength   Overall Strength Within functional limits for tasks performed             LYMPHEDEMA/ONCOLOGY QUESTIONNAIRE - 11/19/19 0001      Type   Cancer Type Right breast cancer      Lymphedema Assessments   Lymphedema Assessments Upper extremities      Right Upper Extremity Lymphedema   10 cm Proximal to Olecranon Process 23.9 cm    Olecranon Process 22.4 cm    10 cm Proximal to Ulnar Styloid Process 20.8 cm    Just Proximal to Ulnar Styloid Process 14.3 cm    Across Hand at [employer] 18.5 cm    At [place] of 2nd Digit 6 cm      Left Upper Extremity Lymphedema   10 cm Proximal to Olecranon Process 23.5 cm    Olecranon Process 22.8 cm    10 cm Proximal to Ulnar Styloid Process 20.3 cm    Just Proximal to Ulnar Styloid Process 14.3 cm    Across Hand at [employer] 18.4 cm    At [place] of 2nd Digit 5.8 cm           L-DEX FLOWSHEETS - 11/19/19 1900      L-DEX LYMPHEDEMA SCREENING   Measurement Type Unilateral    L-DEX MEASUREMENT EXTREMITY Upper Extremity    POSITION  Standing    DOMINANT SIDE Right    At Risk Side Right    BASELINE SCORE (UNILATERAL) 4.2            The patient was assessed using the L-Dex machine today to produce a lymphedema index baseline score. The patient will be reassessed on a regular basis (typically every 3 months) to obtain new L-Dex scores. If the score is > 6.5 points away from his/her baseline score indicating onset of subclinical lymphedema, it will be recommended to wear a compression garment for 4 weeks, 12 hours per day and then be reassessed. If the score continues to be > 6.5 points from baseline at reassessment, we will initiate lymphedema treatment. Assessing in this  manner has a 95% rate of preventing clinically significant lymphedema.     Katina Dung - 11/19/19  0001    Open a tight or new jar Mild difficulty    Do heavy household chores (wash walls, wash floors) Moderate difficulty    Carry a shopping bag or briefcase Moderate difficulty    Wash your back Moderate difficulty    Use a knife to cut food Moderate difficulty    Recreational activities in which you take some force or impact through your arm, shoulder, or hand (golf, hammering, tennis) Moderate difficulty    During the past week, to what extent has your arm, shoulder or hand problem interfered with your normal social activities with family, friends, neighbors, or groups? Modererately    During the past week, to what extent has your arm, shoulder or hand problem limited your work or other regular daily activities Modererately    Arm, shoulder, or hand pain. Moderate    Tingling (pins and needles) in your arm, shoulder, or hand Severe    Difficulty Sleeping Severe difficulty    DASH Score 52.27 %            Objective measurements completed on examination: See above findings.      Patient was instructed today in a home exercise program today for post op shoulder range of motion. These included active assist shoulder flexion in sitting, scapular retraction, wall walking with shoulder abduction, and hands behind head external rotation.  She was encouraged to do these twice a day, holding 3 seconds and repeating 5 times when permitted by her physician.             PT Education - 11/19/19 1938    Education Details Lymphedema risk reduction and post op shoulder ROM HEP    Person(s) Educated Patient;Spouse    Methods Explanation;Demonstration;Handout    Comprehension Returned demonstration;Verbalized understanding               PT Long Term Goals - 11/19/19 1945      PT LONG TERM GOAL #1   Title Patient will demonstrate she has regained full shoulder ROM and function post operatively compared to baselines.    Time 6    Period Months    Status New    Target Date  05/18/20           Breast Clinic Goals - 11/19/19 1945      Patient will be able to verbalize understanding of pertinent lymphedema risk reduction practices relevant to her diagnosis specifically related to skin care.   Time 1    Period Days    Status Achieved      Patient will be able to return demonstrate and/or verbalize understanding of the post-op home exercise program related to regaining shoulder range of motion.   Time 1    Period Days    Status Achieved      Patient will be able to verbalize understanding of the importance of attending the postoperative After Breast Cancer Class for further lymphedema risk reduction education and therapeutic exercise.   Time 1    Period Days    Status Achieved                 Plan - 11/19/19 1940    Clinical Impression Statement Patient was diagnosed on 11/05/2019 with right grade II invasive ductal carcinoma breast cancer. It measures 4.7 cm and is located in the upper outer quadrant. It is ER/PR positive and HER2 negative  with a Ki67 of 5%. She has a biopsied positive axillary lymph node. Her multidisciplinary medical team met prior to her assessments to determine a recommended treatment plan. She is planning to have a Mammaprint test done on her core biopsy to determine need for chemotherapy. If needed, she will have neoadjuvant chemotherapy followed by a lumpectomy or mastectomy with a targeted node dissection, radiaiton, and anti-estrogen therapy. She will benefit from a post op PT reassessment to determine needs and from L-Dex screens every 3 months for 2 years to detect subclinical lymphedema.    Stability/Clinical Decision Making Stable/Uncomplicated    Clinical Decision Making Low    Rehab Potential Excellent    PT Frequency --   Eval and 1 f/u visit   PT Treatment/Interventions ADLs/Self Care Home Management;Therapeutic exercise;Patient/family education    PT Next Visit Plan Will reassess 3-4 weeks post op    PT Home Exercise  Plan Post op shoulder ROM HEP    Consulted and Agree with Plan of Care Patient;Family member/caregiver    Family Member Consulted Husband           Patient will benefit from skilled therapeutic intervention in order to improve the following deficits and impairments:  Postural dysfunction, Decreased range of motion, Impaired UE functional use, Pain, Decreased knowledge of precautions  Visit Diagnosis: Malignant neoplasm of upper-outer quadrant of right breast in female, estrogen receptor positive ([place]) - Plan: PT plan of care cert/re-cert  Pain in thoracic spine - Plan: PT plan of care cert/re-cert   Patient will follow up at outpatient cancer rehab 3-4 weeks following surgery.  If the patient requires physical therapy at that time, a specific plan will be dictated and sent to the referring physician for approval. The patient was educated today on appropriate basic range of motion exercises to begin post operatively and the importance of attending the After Breast Cancer class following surgery.  Patient was educated today on lymphedema risk reduction practices as it pertains to recommendations that will benefit the patient immediately following surgery.  She verbalized good understanding.      Problem List Patient Active Problem List   Diagnosis Date Noted  . Family history of breast cancer 11/19/2019  . Malignant neoplasm of upper-outer quadrant of right breast in female, estrogen receptor positive ([place]) 11/17/2019  . S/P breast augmentation 09/18/2019  . Hx of migraines 06/20/2011  . GERD (gastroesophageal reflux disease) 06/20/2011  . Anemia 06/20/2011   Caitlyn Miller, PT 11/19/19 7:48 PM  [place], [place], [address] Phone: [phone]   Fax:  [phone]  Name: Caitlyn Miller MRN: [id number] Date of Birth: 11-Jul-1985

## 2019-11-19 NOTE — Patient Instructions (Signed)

## 2019-11-19 NOTE — Progress Notes (Signed)
Radiation Oncology         [phone] ________________________________  Name: Caitlyn Miller        MRN: [id number]  Date of Service: 11/19/2019 DOB: 03-31-1985  PN:[id number], Lilia Argue, MD (Inactive)  Donnie Mesa, MD     REFERRING PHYSICIAN: Donnie Mesa, MD   DIAGNOSIS: The encounter diagnosis was Malignant neoplasm of upper-outer quadrant of right breast in female, estrogen receptor positive ([place]).   HISTORY OF PRESENT ILLNESS: Caitlyn Miller is a 34 y.o. female seen in the multidisciplinary breast clinic for a new diagnosis of right, node positive breast cancer. The patient was noted to have a palpable abnormality in the right breast.  She has a history of breast augmentation and presented on 11/05/2019 for diagnostic mammogram.  This revealed calcifications measuring 3.8 cm in greatest dimension, a targeted ultrasound was also performed revealing a mass within the upper outer right breast at the site of the palpable concern approximately at 11:00 there was a 4.4 x 1.4 x 1.4 cm definable mass and 2 adjacent cortically thickened lymph nodes in the right axilla.  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  Her lymph node was positive, and the tumor was ER/PR positive, HER-2 was negative with a Ki-67 of 5%.  She is seen today to discuss treatment recommendations for her cancer.    PREVIOUS RADIATION THERAPY: No   PAST MEDICAL HISTORY:  Past Medical History:  Diagnosis Date   Abnormal Pap smear    Acid reflux    Anemia    Decreased appetite 10/08   Endometriosis 10/2004   Epigastric pain 10/2006   FH: migraines    GBS carrier    H/O amenorrhea 06/2006   H/O dyspareunia 7/07   H/O fatigue    H/O nausea and vomiting 10/2006   H/O rubella    H/O varicella    Hyperemesis arising during pregnancy    First pregnancy   Irregular periods/menstrual cycles 02/2005     Pelvic pain 09/2008       PAST SURGICAL HISTORY: Past Surgical History:  Procedure Laterality Date   AUGMENTATION MAMMAPLASTY Bilateral 4008   silicone    WISDOM TOOTH EXTRACTION       FAMILY HISTORY:  Family History  Problem Relation Age of Onset   Migraines Mother    Hypertension Father    Colon cancer Neg Hx    Stomach cancer Neg Hx      SOCIAL HISTORY:  reports that she has never smoked. She has never used smokeless tobacco. She reports that she does not drink alcohol and does not use drugs.  The patient is married and lives in [place].  She is a Engineer, maintenance. She has a 36 year old and 20 year old child. She's from [place] and spends much of her time in [place] as well and is hoping her mother can travel on a VISA during her illness as she and her husband do not have family in this country.    ALLERGIES: Ibuprofen   MEDICATIONS:  Current Outpatient Medications  Medication Sig Dispense Refill   escitalopram (LEXAPRO) 10 MG tablet Take 1 tablet (10 mg total) by mouth at bedtime. 90 tablet 1   hydrOXYzine (ATARAX/VISTARIL) 25 MG tablet Take 1 tablet (25 mg total) by mouth at bedtime as needed for anxiety. 90 tablet 0   pantoprazole (PROTONIX) 40 MG tablet Take 1 tablet (40  mg total) by mouth daily as needed. 30 tablet 5   No current facility-administered medications for this encounter.     REVIEW OF SYSTEMS: On review of systems, the patient reports that she is doing well overall. She denies any specific complaints      PHYSICAL EXAM:  Wt Readings from Last 3 Encounters:  09/18/19 127 lb (57.6 kg)  03/13/18 123 lb 12.8 oz (56.2 kg)  02/12/18 125 lb (56.7 kg)   Temp Readings from Last 3 Encounters:  09/24/19 98.8 F (37.1 C) (Oral)  09/18/19 97.6 F (36.4 C)  03/13/18 98.8 F (37.1 C) (Oral)   BP Readings from Last 3 Encounters:  09/24/19 (!) 113/59  09/18/19 112/74  03/13/18 107/74   Pulse Readings from Last 3 Encounters:  09/24/19 61   09/18/19 76  03/13/18 86    In general this is a well appearing  female in no acute distress. She's alert and oriented x4 and appropriate throughout the examination. Cardiopulmonary assessment is negative for acute distress and she exhibits normal effort. Bilateral breast exam is deferred.    ECOG = 0  0 - Asymptomatic (Fully active, able to carry on all predisease activities without restriction)  1 - Symptomatic but completely ambulatory (Restricted in physically strenuous activity but ambulatory and able to carry out work of a light or sedentary nature. For example, light housework, office work)  2 - Symptomatic, <50% in bed during the day (Ambulatory and capable of all self care but unable to carry out any work activities. Up and about more than 50% of waking hours)  3 - Symptomatic, >50% in bed, but not bedbound (Capable of only limited self-care, confined to bed or chair 50% or more of waking hours)  4 - Bedbound (Completely disabled. Cannot carry on any self-care. Totally confined to bed or chair)  5 - Death   Eustace Pen MM, Creech RH, Tormey DC, et al. [phone]). "Toxicity and response criteria of the [employer] [place] [employer]". [place] Oncol. 5 (6): 649-55    LABORATORY DATA:  Lab Results  Component Value Date   WBC 6.4 11/19/2019   HGB 12.0 11/19/2019   HCT 36.3 11/19/2019   MCV 92.8 11/19/2019   PLT 281 11/19/2019   Lab Results  Component Value Date   NA 142 05/01/2017   K 4.5 05/01/2017   CL 104 05/01/2017   CO2 23 05/01/2017   Lab Results  Component Value Date   ALT 23 05/01/2017   AST 20 05/01/2017   ALKPHOS 40 05/01/2017   BILITOT 0.4 05/01/2017      RADIOGRAPHY: US BREAST LTD UNI RIGHT INC AXILLA  Result Date: 11/05/2019 CLINICAL DATA:  Patient presents for palpable abnormality within the upper outer right breast. EXAM: DIGITAL DIAGNOSTIC BILATERAL MAMMOGRAM WITH IMPLANTS AND TOMO ULTRASOUND RIGHT BREAST The patient has retropectoral  implants. Standard and implant displaced views were performed. COMPARISON:  Previous exam(s). ACR Breast Density Category c: The breast tissue is heterogeneously dense, which may obscure small masses. FINDINGS: Underlying the palpable marker within the upper-outer right breast is a large irregular spiculated mass, partially obscured due to the adjacent dense fibroglandular tissue. There are associated coarse heterogeneous calcifications within the upper-outer right breast. Calcifications measure an area of 3.8 x 3.8 cm. No suspicious findings within the left breast. On physical exam, there is a firm mass within the upper-outer right breast. Targeted ultrasound is performed, showing a large irregular hypoechoic mixed echogenicity mass within the upper-outer right breast at the  site of palpable concern. Given the size and mixed echogenicity, this is somewhat difficult to measure in entirety however within the right breast 11 o'clock position 4 cm from nipple there is a 4.7 x 1.4 x 1.4 cm definable mass. There are 2 adjacent cortically thickened nodes within the right axilla measuring up to 4 mm in thickness. IMPRESSION: Large irregular palpable mass within the upper-outer right breast 11 o'clock position. There is a large area of associated coarse heterogeneous calcifications within the mass. Overall findings are concerning for breast carcinoma. Two cortically thickened right axillary lymph nodes which are indeterminate in etiology. RECOMMENDATION: Ultrasound-guided core needle biopsy of the large palpable mass upper outer right breast. Ultrasound-guided core needle biopsy of 1 of the cortically thickened right axillary lymph nodes. After the ultrasound-guided biopsies, recommend further evaluation with bilateral breast MRI given the large nature of the mass, associated calcifications and dense breast tissue. I have discussed the findings and recommendations with the patient. If applicable, a reminder letter will be  sent to the patient regarding the next appointment. BI-RADS CATEGORY  5: Highly suggestive of malignancy. Electronically Signed   By: Lovey Newcomer M.D.   On: 11/05/2019 09:18   MM DIAG BREAST W/IMPLANT TOMO BILATERAL  Result Date: 11/05/2019 CLINICAL DATA:  Patient presents for palpable abnormality within the upper outer right breast. EXAM: DIGITAL DIAGNOSTIC BILATERAL MAMMOGRAM WITH IMPLANTS AND TOMO ULTRASOUND RIGHT BREAST The patient has retropectoral implants. Standard and implant displaced views were performed. COMPARISON:  Previous exam(s). ACR Breast Density Category c: The breast tissue is heterogeneously dense, which may obscure small masses. FINDINGS: Underlying the palpable marker within the upper-outer right breast is a large irregular spiculated mass, partially obscured due to the adjacent dense fibroglandular tissue. There are associated coarse heterogeneous calcifications within the upper-outer right breast. Calcifications measure an area of 3.8 x 3.8 cm. No suspicious findings within the left breast. On physical exam, there is a firm mass within the upper-outer right breast. Targeted ultrasound is performed, showing a large irregular hypoechoic mixed echogenicity mass within the upper-outer right breast at the site of palpable concern. Given the size and mixed echogenicity, this is somewhat difficult to measure in entirety however within the right breast 11 o'clock position 4 cm from nipple there is a 4.7 x 1.4 x 1.4 cm definable mass. There are 2 adjacent cortically thickened nodes within the right axilla measuring up to 4 mm in thickness. IMPRESSION: Large irregular palpable mass within the upper-outer right breast 11 o'clock position. There is a large area of associated coarse heterogeneous calcifications within the mass. Overall findings are concerning for breast carcinoma. Two cortically thickened right axillary lymph nodes which are indeterminate in etiology. RECOMMENDATION:  Ultrasound-guided core needle biopsy of the large palpable mass upper outer right breast. Ultrasound-guided core needle biopsy of 1 of the cortically thickened right axillary lymph nodes. After the ultrasound-guided biopsies, recommend further evaluation with bilateral breast MRI given the large nature of the mass, associated calcifications and dense breast tissue. I have discussed the findings and recommendations with the patient. If applicable, a reminder letter will be sent to the patient regarding the next appointment. BI-RADS CATEGORY  5: Highly suggestive of malignancy. Electronically Signed   By: Lovey Newcomer M.D.   On: 11/05/2019 09:18   [place] AXILLARY NODE CORE BIOPSY RIGHT  Addendum Date: 11/14/2019   ADDENDUM REPORT: 11/14/2019 08:16 ADDENDUM: Pathology revealed GRADE II INVASIVE MAMMARY CARCINOMA, MAMMARY CARCINOMA IN SITU of the RIGHT breast, 11 o'clock, upper outer  quadrant. E-cadherin is strongly positive consistent with a ductal phenotype. This was found to be concordant by Dr. Kristopher Oppenheim. 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 This was found to be concordant by Dr. Kristopher Oppenheim. Pathology results were discussed with the patient and her husband by telephone. The patient reported doing well after the biopsies with tenderness at the sites. Post biopsy instructions and care were reviewed and questions were answered. The patient was encouraged to call [facility] for any additional concerns. The patient was referred to [facility] on November 19, 2019. The patient will need bilateral breast MRI given age and breast density, and extent of disease. Pathology results reported by Stacie Acres RN on 11/14/2019. Electronically Signed   By: Kristopher Oppenheim M.D.   On: 11/14/2019 08:16   Result Date: 11/14/2019 CLINICAL DATA:  34 year old female with a highly  suspicious right breast mass and indeterminate right axillary lymphadenopathy. EXAM: ULTRASOUND GUIDED RIGHT BREAST CORE NEEDLE BIOPSY ULTRASOUND-GUIDED RIGHT AXILLARY CORE NEEDLE BIOPSY COMPARISON:  Previous exam(s). PROCEDURE: I met with the patient and we discussed the procedure of ultrasound-guided biopsy, including benefits and alternatives. We discussed the high likelihood of a successful procedure. We discussed the risks of the procedure, including infection, bleeding, tissue injury, clip migration, and inadequate sampling. Informed written consent was given. The usual time-out protocol was performed immediately prior to the procedure. Lesion quadrant: Upper outer quadrant Using sterile technique and 1% Lidocaine as local anesthetic, under direct ultrasound visualization, a 14 gauge spring-loaded device was used to perform biopsy of a spiculated mass at the 11 o'clock position using a lateral approach. At the conclusion of the procedure ribbon shaped tissue marker clip was deployed into the biopsy cavity. Using sterile technique and 1% Lidocaine as local anesthetic, under direct ultrasound visualization, a 14 gauge spring-loaded device was used to perform biopsy of a right axillary lymph node using a inferior approach. At the conclusion of the procedure [facility] tissue marker clip was deployed into the biopsy cavity. Follow up 2 view mammogram was performed and dictated separately. IMPRESSION: Ultrasound guided biopsy of the right breast and axilla. No apparent complications. Electronically Signed: By: Kristopher Oppenheim M.D. On: 11/12/2019 11:35   MM CLIP PLACEMENT RIGHT  Result Date: 11/12/2019 CLINICAL DATA:  34 year old female status post ultrasound-guided biopsy of the right breast and axilla. EXAM: DIAGNOSTIC RIGHT MAMMOGRAM POST ULTRASOUND BIOPSY COMPARISON:  Previous exam(s). FINDINGS: Mammographic images were obtained following after sound guided biopsy of the right breast and axilla. Both post  biopsy marking clips are identified in the expected positions. IMPRESSION: Appropriate positioning of ribbon shaped and HydroMARK post biopsy clips in the right breast and axilla. Final Assessment: Post Procedure Mammograms for Marker Placement Electronically Signed   By: Kristopher Oppenheim M.D.   On: 11/12/2019 11:36   [place] RT BREAST BX W LOC DEV 1ST LESION IMG BX SPEC US GUIDE  Addendum Date: 11/14/2019   ADDENDUM REPORT: 11/14/2019 08:16 ADDENDUM: Pathology revealed GRADE II INVASIVE MAMMARY CARCINOMA, MAMMARY CARCINOMA IN SITU of the RIGHT breast, 11 o'clock, upper outer quadrant. E-cadherin is strongly positive consistent with a ductal phenotype. This was found to be concordant by Dr. Kristopher Oppenheim. 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 This was found to be concordant by Dr. Kristopher Oppenheim. Pathology results were discussed with the patient and her husband by telephone. The patient reported doing  well after the biopsies with tenderness at the sites. Post biopsy instructions and care were reviewed and questions were answered. The patient was encouraged to call [facility] for any additional concerns. The patient was referred to [facility] on November 19, 2019. The patient will need bilateral breast MRI given age and breast density, and extent of disease. Pathology results reported by Stacie Acres RN on 11/14/2019. Electronically Signed   By: Kristopher Oppenheim M.D.   On: 11/14/2019 08:16   Result Date: 11/14/2019 CLINICAL DATA:  34 year old female with a highly suspicious right breast mass and indeterminate right axillary lymphadenopathy. EXAM: ULTRASOUND GUIDED RIGHT BREAST CORE NEEDLE BIOPSY ULTRASOUND-GUIDED RIGHT AXILLARY CORE NEEDLE BIOPSY COMPARISON:  Previous exam(s). PROCEDURE: I met with the patient and we discussed the procedure of ultrasound-guided biopsy,  including benefits and alternatives. We discussed the high likelihood of a successful procedure. We discussed the risks of the procedure, including infection, bleeding, tissue injury, clip migration, and inadequate sampling. Informed written consent was given. The usual time-out protocol was performed immediately prior to the procedure. Lesion quadrant: Upper outer quadrant Using sterile technique and 1% Lidocaine as local anesthetic, under direct ultrasound visualization, a 14 gauge spring-loaded device was used to perform biopsy of a spiculated mass at the 11 o'clock position using a lateral approach. At the conclusion of the procedure ribbon shaped tissue marker clip was deployed into the biopsy cavity. Using sterile technique and 1% Lidocaine as local anesthetic, under direct ultrasound visualization, a 14 gauge spring-loaded device was used to perform biopsy of a right axillary lymph node using a inferior approach. At the conclusion of the procedure [facility] tissue marker clip was deployed into the biopsy cavity. Follow up 2 view mammogram was performed and dictated separately. IMPRESSION: Ultrasound guided biopsy of the right breast and axilla. No apparent complications. Electronically Signed: By: Kristopher Oppenheim M.D. On: 11/12/2019 11:35       IMPRESSION/PLAN: 1. Stage IIA, [id number], grade 2, ER/PR positive invasive ductal carcinoma of the right breast. Dr. MLisbeth Renshawdiscusses the pathology findings and reviews the nature of node positive breast disease. The consensus from the breast conference includes proceeding with mammaprint on her biopsy specimen to determine a role for chemotherapy. Surgery is to be determined. She would benefit from adjuvant radiotherapy to the breast or chest wall as well as regional nodes. She will also benefit from long term adjuvant antiestrogen therapy We discussed the risks, benefits, short, and long term effects of radiotherapy, and the patient is interested in proceeding.  Dr. MLisbeth Renshawdiscusses the delivery and logistics of radiotherapy and anticipates a course of 6 1/2 weeks of radiotherapy. We will see her back about 2 weeks after surgery to discuss the simulation process and anticipate we starting radiotherapy about 4-6 weeks after surgery.  2. Contraceptive Counseling. The patient has had a history of endometriosis and amenorrhea, but is still having regular cycles. She will need to use contraception to avoid pregnancy during radiotherapy which will be considered at that time. 3. Possible genetic predisposition to malignancy. The patient is a candidate for genetic testing given her personal and family history. She was offered referral and is interested in meeting with genetics which will happen today. 4. Social needs. The patient and her husband do not have family in this country. A letter was written in support of her [place].  In a visit lasting 60 minutes, greater than 50% of the time was spent face to face reviewing her case, as well as in preparation of, discussing, and coordinating the patient's care.  The above documentation reflects my direct findings during this shared patient visit. Please see the separate note by Dr. Lisbeth Renshaw on this date for the remainder of the patient's plan of care.    Carola Rhine, PAC

## 2019-11-19 NOTE — H&P (Signed)
History of Present Illness Caitlyn Miller. Caitlyn Godden MD; 11/19/2019 7:41 PM) The patient is a 34 year old female who presents with breast cancer. Breast cancer MDC - 11/19/19 [name] PCP - Dr. [name]  This is a 34 year old [name] female who presents about one year s/p bilateral retropectoral breast implants by Dr. Nathanial Rancher. The patient states that her last mammogram was several years ago. Over the last six months, she has noticed an enlarging palpable abnormality in the right breast - upper outer quadrant. She underwent workup which showed a 3.8 cm area of calcifications at 1100 4 cmfn. [place] measured this at 4.4 x 1.4 x. 1.4 cm with two thickened lymph nodes in the right axilla. 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 Her lymph node was positive, and the tumor was ER/PR positive, HER-2 was negative with a Ki-67 of 5%.  She also reports pain in the mid chest and mid back pain, (onset 3 years ago) which was previously intermittent and now constant in the past 3 months. She also notes pain and weakness in her b/l arms and legs. Her pain is 7-8/10. She had work up with MRI in [place] in summer 2021 which was reportedly negative. She notes the weakness leads to decreased function and activity. Her weight is mostly stable, but she is eating less. She describes frequent pain in her epigastrium that radiates through to her back, usually after eating or swallowing medication. She describes nausea, heartburn, bloating. She states that she had an ultrasound in [place] that showed no gallstones, but she has never had a HIDA scan. She is on Protonix.  CLINICAL DATA: Patient presents for palpable abnormality within the upper outer right breast.  EXAM: DIGITAL DIAGNOSTIC BILATERAL MAMMOGRAM WITH IMPLANTS AND TOMO  ULTRASOUND RIGHT BREAST  The patient has retropectoral implants. Standard  and implant displaced views were performed.  COMPARISON: Previous exam(s).  ACR Breast Density Category c: The breast tissue is heterogeneously dense, which may obscure small masses.  FINDINGS: Underlying the palpable marker within the upper-outer right breast is a large irregular spiculated mass, partially obscured due to the adjacent dense fibroglandular tissue. There are associated coarse heterogeneous calcifications within the upper-outer right breast. Calcifications measure an area of 3.8 x 3.8 cm. No suspicious findings within the left breast.  On physical exam, there is a firm mass within the upper-outer right breast.  Targeted ultrasound is performed, showing a large irregular hypoechoic mixed echogenicity mass within the upper-outer right breast at the site of palpable concern. Given the size and mixed echogenicity, this is somewhat difficult to measure in entirety however within the right breast 11 o'clock position 4 cm from nipple there is a 4.7 x 1.4 x 1.4 cm definable mass.  There are 2 adjacent cortically thickened nodes within the right axilla measuring up to 4 mm in thickness.  IMPRESSION: Large irregular palpable mass within the upper-outer right breast 11 o'clock position. There is a large area of associated coarse heterogeneous calcifications within the mass. Overall findings are concerning for breast carcinoma.  Two cortically thickened right axillary lymph nodes which are indeterminate in etiology.  RECOMMENDATION: Ultrasound-guided core needle biopsy of the large palpable mass upper outer right breast.  Ultrasound-guided core needle biopsy of 1 of the cortically thickened right axillary lymph nodes.  After the ultrasound-guided biopsies, recommend further evaluation with bilateral breast MRI given the large nature  of the mass, associated calcifications and dense breast tissue.  I have discussed the findings and recommendations with the  patient. If applicable, a reminder letter will be sent to the patient regarding the next appointment.  BI-RADS CATEGORY 5: Highly suggestive of malignancy.   Electronically Signed By: Lovey Newcomer M.D. On: 11/05/2019 09:18  CLINICAL DATA: 34 year old female with a highly suspicious right breast mass and indeterminate right axillary lymphadenopathy.  EXAM: ULTRASOUND GUIDED RIGHT BREAST CORE NEEDLE BIOPSY  ULTRASOUND-GUIDED RIGHT AXILLARY CORE NEEDLE BIOPSY  COMPARISON: Previous exam(s).  PROCEDURE: I met with the patient and we discussed the procedure of ultrasound-guided biopsy, including benefits and alternatives. We discussed the high likelihood of a successful procedure. We discussed the risks of the procedure, including infection, bleeding, tissue injury, clip migration, and inadequate sampling. Informed written consent was given. The usual time-out protocol was performed immediately prior to the procedure.  Lesion quadrant: Upper outer quadrant  Using sterile technique and 1% Lidocaine as local anesthetic, under direct ultrasound visualization, a 14 gauge spring-loaded device was used to perform biopsy of a spiculated mass at the 11 o'clock position using a lateral approach. At the conclusion of the procedure ribbon shaped tissue marker clip was deployed into the biopsy cavity.  Using sterile technique and 1% Lidocaine as local anesthetic, under direct ultrasound visualization, a 14 gauge spring-loaded device was used to perform biopsy of a right axillary lymph node using a inferior approach. At the conclusion of the procedure [facility] tissue marker clip was deployed into the biopsy cavity.  Follow up 2 view mammogram was performed and dictated separately.  IMPRESSION: Ultrasound guided biopsy of the right breast and axilla. No apparent complications.  Electronically Signed: By: Kristopher Oppenheim M.D. On: 11/12/2019 11:35   Past Surgical History Caitlyn Slipper, RN; 11/19/2019 8:20 AM) Breast Biopsy Bilateral.  Diagnostic Studies History Caitlyn Slipper, RN; 11/19/2019 8:20 AM) Colonoscopy 5-10 years ago Mammogram within last year Pap Smear 1-5 years ago  Allergies Rodman Key K. Oneil Behney, MD; 11/19/2019 7:42 PM) Ibuprofen *ANALGESICS - ANTI-INFLAMMATORY* Nausea, Vomiting.  Medication History Caitlyn Miller. Caitlyn Gaxiola, MD; 11/19/2019 7:42 PM) Medications Reconciled Lexapro (10MG Tablet, Oral) Active. hydrOXYzine HCl (25MG Tablet, Oral) Active. Protonix (40MG Tablet DR, Oral) Active.  Social History Caitlyn Slipper, RN; 11/19/2019 8:20 AM) Caffeine use Coffee, Tea. No alcohol use No drug use Tobacco use Never smoker.  Family History Caitlyn Slipper, RN; 11/19/2019 8:20 AM) Migraine Headache Mother.  Pregnancy / Birth History Caitlyn Slipper, RN; 11/19/2019 8:20 AM) Age at menarche 51 years. Contraceptive History Intrauterine device. Gravida 2 Length (months) of breastfeeding 12-24 Maternal age 33-20 Para 2  Other Problems Caitlyn Slipper, RN; 11/19/2019 8:20 AM) Back Pain Chest pain Depression Gastroesophageal Reflux Disease     Review of Systems Caitlyn Slipper RN; 11/19/2019 8:20 AM) General Present- Fatigue, Fever and Weight Loss. Not Present- Appetite Loss, Chills, Night Sweats and Weight Gain. Skin Not Present- Change in Wart/Mole, Dryness, Hives, Jaundice, New Lesions, Non-Healing Wounds, Rash and Ulcer. HEENT Not Present- Earache, Hearing Loss, Hoarseness, Nose Bleed, Oral Ulcers, Ringing in the Ears, Seasonal Allergies, Sinus Pain, Sore Throat, Visual Disturbances, Wears glasses/contact lenses and Yellow Eyes. Respiratory Present- Chronic Cough and Difficulty Breathing. Not Present- Bloody sputum, Snoring and Wheezing. Breast Present- Breast Pain. Not Present- Breast Mass, Nipple Discharge and Skin Changes. Cardiovascular Present- Chest Pain, Difficulty Breathing Lying Down, Palpitations and Shortness of Breath. Not Present- Leg  Cramps, Rapid Heart Rate and Swelling of Extremities. Gastrointestinal Present- Difficulty Swallowing, Nausea and Vomiting. Not Present-  Abdominal Pain, Bloating, Bloody Stool, Change in Bowel Habits, Chronic diarrhea, Constipation, Excessive gas, Gets full quickly at meals, Hemorrhoids, Indigestion and Rectal Pain. Female Genitourinary Present- Nocturia. Not Present- Frequency, Painful Urination, Pelvic Pain and Urgency. Musculoskeletal Present- Back Pain, Joint Pain and Muscle Weakness. Not Present- Joint Stiffness, Muscle Pain and Swelling of Extremities. Neurological Present- Trouble walking and Weakness. Not Present- Decreased Memory, Fainting, Headaches, Numbness, Seizures, Tingling and Tremor. Psychiatric Present- Depression. Not Present- Anxiety, Bipolar, Change in Sleep Pattern, Fearful and Frequent crying. Endocrine Present- Hot flashes. Not Present- Cold Intolerance, Excessive Hunger, Hair Changes, Heat Intolerance and New Diabetes.   Physical Exam Rodman Key K. Zaiyah Sottile MD; 11/19/2019 7:43 PM)  The physical exam findings are as follows: Note:Constitutional: WDWN in NAD, conversant, no obvious deformities; resting comfortably Eyes: Pupils equal, round; sclera anicteric; moist conjunctiva; no lid lag HENT: Oral mucosa moist; good dentition Neck: No masses palpated, trachea midline; no thyromegaly Lungs: CTA bilaterally; normal respiratory effort CV: Regular rate and rhythm; no murmurs; extremities well-perfused with no edema Breasts: symmetric, bilateral implants. No nipple changes or discharge. Bruising from biopsies. Palpable 5 cm mass in RUOQ 4 cmfn Abd: +bowel sounds, soft, non-tender, no palpable organomegaly; no palpable hernias Musc: Normal gait; no apparent clubbing or cyanosis in extremities Lymphatic: No palpable cervical lymphadenopathy. Palpable 2 cm right axillary LN. Skin: Warm, dry; no sign of jaundice Psychiatric - alert and oriented x 4; calm mood and  affect    Assessment & Plan Rodman Key K. 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 Her work-up still requires a MRI to determine extent of disease and to rule out disease in the contralateral breast. She is seeing Genetic Counseling today re: genetic testing. Mammaprint is pending on the core sample. CT/PET has also been ordered.  Based on the results of those studies, we will make a decision on surgical treatment. If she is felt to be a good candidate for neoadjuvant chemotherapy, then we will place a port for chemo. If she responds to neoadjuvant chemo, she may be a candidate for lumpectomy/ targeted axillary lymph node dissection. If genetic testing is positive, she may consider mastectomy (with or without contralateral prophylactic mastectomy, with or without immediate reconstruction).   She may need a HIDA scan to evaluate her gallbladder function.  Caitlyn Miller. Georgette Dover, MD, [facility] Surgery  General/ Trauma Surgery   11/19/2019 7:50 PM

## 2019-11-20 ENCOUNTER — Encounter: Payer: Self-pay | Admitting: General Practice

## 2019-11-20 NOTE — Progress Notes (Signed)
[facility] Psychosocial Distress Screening Spiritual Care  Met with Rhylen and her husband in [facility] to introduce [place] team/resources, reviewing distress screen per protocol.  The patient scored a 9 on the Psychosocial Distress Thermometer which indicates severe distress. Also assessed for distress and other psychosocial needs.   ONCBCN DISTRESS SCREENING 11/20/2019  Screening Type Initial Screening  Distress experienced in past week (1-10) 9  Practical problem type Housing;Insurance;Work/school  Emotional problem type Depression;Nervousness/Anxiety;Adjusting to illness;Feeling hopeless;Adjusting to appearance changes  Physical Problem type Pain;Nausea/vomiting;Sleep/insomnia;Bathing/dressing;Breathing;Changes in urination;Tingling hands/feet;Skin dry/itchy;Swollen arms/legs  Referral to support programs Yes   Enza and her husband are from [place] and speak [place], [place], and [place]. Their children are 12 and 15. The family is Muslim and practices their faith at home, but is not involved with a local mosque. Annelisa reports that she recently stopped working at her [employer] because of physical tiredness. She notes that her troubles with insomnia are due to pain.   Follow up needed: Yes.  Philip welcomes a follow-up call from [place]. Will place an Network engineer referral for a peer mentor, as well.   [place], [place], [facility] Pager [phone] Voicemail [phone]

## 2019-11-25 ENCOUNTER — Other Ambulatory Visit: Payer: Self-pay

## 2019-11-25 ENCOUNTER — Encounter (HOSPITAL_COMMUNITY)
Admission: RE | Admit: 2019-11-25 | Discharge: 2019-11-25 | Disposition: A | Discharge status: Home / Self Care | Payer: 59 | Source: Ambulatory Visit | Attending: Obstetrics | Admitting: Obstetrics

## 2019-11-25 DIAGNOSIS — Z170 Estrogen receptor positive status [ER+]: Secondary | ICD-10-CM | POA: Diagnosis present

## 2019-11-25 DIAGNOSIS — C50411 Malignant neoplasm of upper-outer quadrant of right female breast: Secondary | ICD-10-CM | POA: Insufficient documentation

## 2019-11-25 LAB — GLUCOSE, CAPILLARY: Glucose-Capillary: 85 mg/dL (ref 70–99)

## 2019-11-25 IMAGING — CT NM PET TUM IMG INITIAL (PI) SKULL BASE T - THIGH
1 of 8 series · 1 of 25 positions shown · non-contrast
Comparison: None.

CLINICAL DATA: Initial treatment strategy for right breast cancer.

EXAM:
NUCLEAR MEDICINE PET SKULL BASE TO THIGH
TECHNIQUE: 6.4 mCi F-18 FDG was injected intravenously. Full-ring PET imaging
was performed from the skull base to thigh after the radiotracer. CT
data was obtained and used for attenuation correction and anatomic
localization.
Fasting blood glucose: 85 mg/dl

[Series 4: ct sk_thigh 5.0 bf37 · axial · 5.0mm · 0.98mm/px · 1 of 210 slices shown]
[im 210/210  brain]
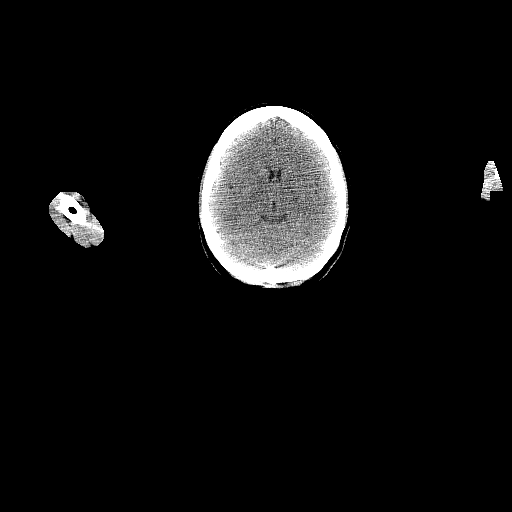

[1 of 25 positions shown; findings below may reference images not displayed]

FINDINGS: Mediastinal blood pool activity: SUV max

Liver activity: SUV max NA

NECK: No hypermetabolic lymph nodes in the neck.

Incidental CT findings: none

CHEST:

Indistinct asymmetric hypermetabolism throughout the upper right
breast with max SUV 4.3 with associated biopsy clip on the CT
images, without a discrete mass on the CT images, compatible with
known primary right breast malignancy.

Two mildly enlarged hypermetabolic right axillary lymph nodes
measuring 1.0 cm with max SUV 4.3 and 1.0 cm with max SUV
(series 4/images 48 and 46). No hypermetabolic left axillary lymph
nodes. No hypermetabolic mediastinal or hilar lymph nodes. No
hypermetabolic pulmonary findings.

Incidental CT findings: Bilateral breast prostheses. No significant
pulmonary nodules.

ABDOMEN/PELVIS: No abnormal hypermetabolic activity within the
liver, pancreas, adrenal glands, or spleen. No hypermetabolic lymph
nodes in the abdomen or pelvis.

Symmetric hypermetabolism in the bilateral ovaries without discrete
mass correlate on the CT images, compatible with physiologic ovarian
activity. Simple 2.5 cm left ovarian cyst (series 4/image 150)
demonstrates no FDG uptake, compatible with a physiologic left
ovarian follicle. Generalized endometrial activity without discrete
mass on the CT images, considered physiologic.

Incidental CT findings: none

SKELETON: No focal hypermetabolic activity to suggest skeletal
metastasis.

Incidental CT findings: none
IMPRESSION: 1. Two mildly enlarged hypermetabolic right axillary lymph nodes
compatible with right axillary nodal metastases.
2. No additional sites of hypermetabolic metastatic disease.
3. Asymmetric indistinct upper right breast hypermetabolism, without
discrete mass correlate on the CT images, compatible with known
primary right breast malignancy.

## 2019-11-25 IMAGING — MR MR BREAST BILAT WO/W CM
9 of 14 series · 29 of 48 positions shown · IV contrast (gadavist)
Comparison: No prior MRI. Recent mammography and RIGHT breast and
axillary ultrasound.

CLINICAL DATA: 34-year-old presenting with a palpable lump in the
UPPER OUTER QUADRANT of the RIGHT breast, biopsy grade 2 invasive
ductal carcinoma and DCIS, with biopsy-proven metastatic disease to
a RIGHT axillary lymph node (clinical stage IIA). Pre-treatment MRI
in order to determine extent of disease. The patient has BILATERAL
retropectoral implants.

LABS:  Not applicable.
EXAM:
BILATERAL BREAST MRI WITH AND WITHOUT CONTRAST
TECHNIQUE: Multiplanar, multisequence MR images of both breasts were obtained
prior to and following the intravenous administration of 6 ml of
Gadavist.

[Series 2: T2 · axial · 3.0mm · 0.81mm/px · z∈[-64,+122]mm · 2 of 63 slices shown (1 of 2)]
[im 1/63]
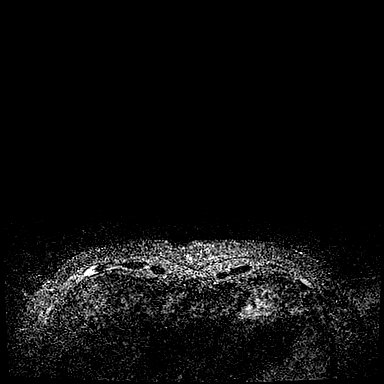
[im 63/63]
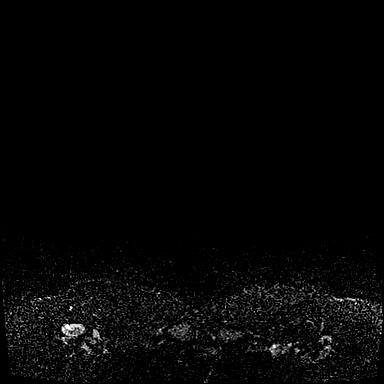

[Series 3: T1 fat-sat · axial · 1.2mm · 0.69mm/px · z∈[-66,+124]mm · 6 of 160 slices shown (1 of 4)]
[im 1/160]
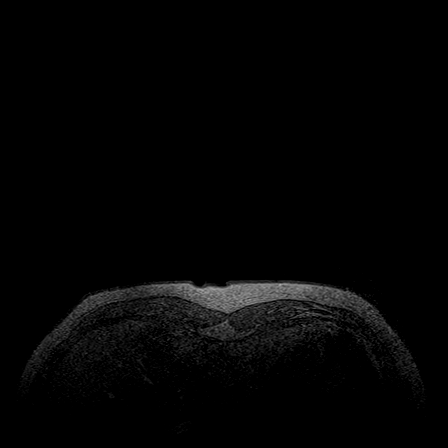
[im 32/160]
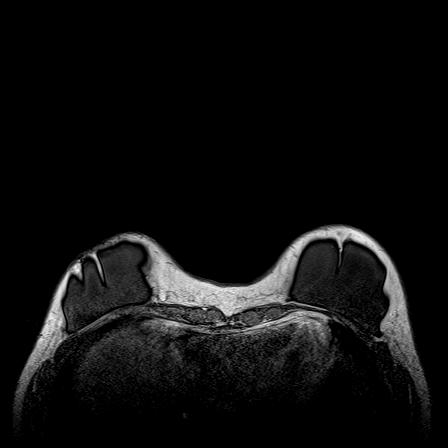
[im 64/160]
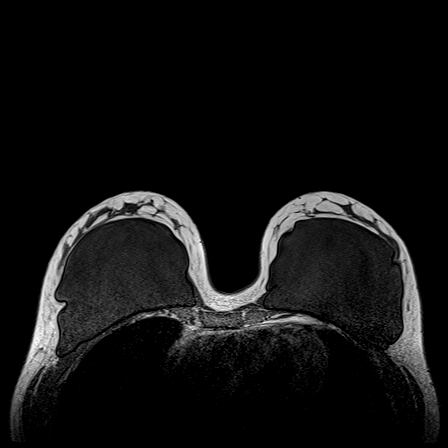
[im 96/160]
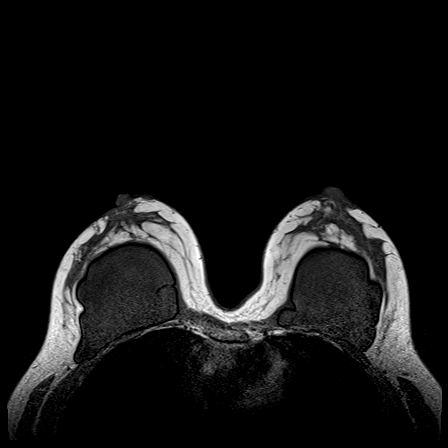
[im 128/160]
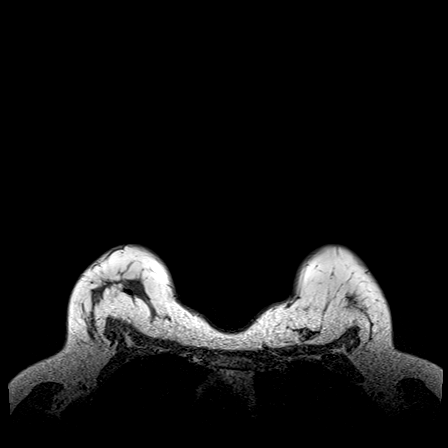
[im 160/160]
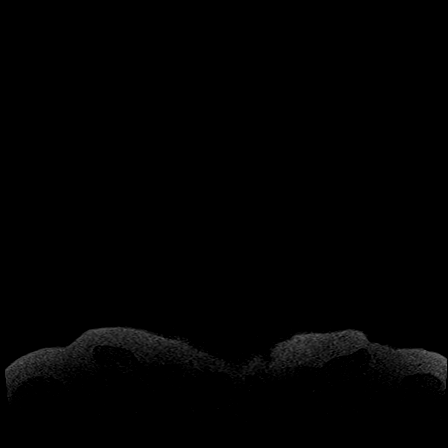

[Series 4: T2 · axial · 3.0mm · 0.97mm/px · z∈[-64,+122]mm · 2 of 63 slices shown (2 of 2)]
[im 1/63]
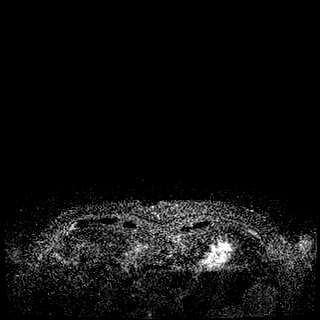
[im 63/63]
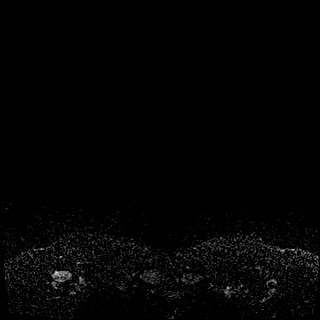

[Series 6: T1 fat-sat · axial · 1.6mm · 0.75mm/px · z∈[-60,+118]mm · 4 of 112 slices shown (2 of 4)]
[im 1/112]
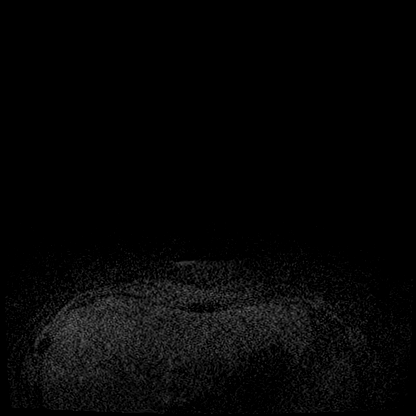
[im 38/112]
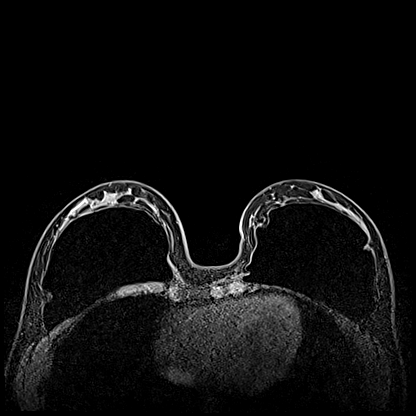
[im 75/112]
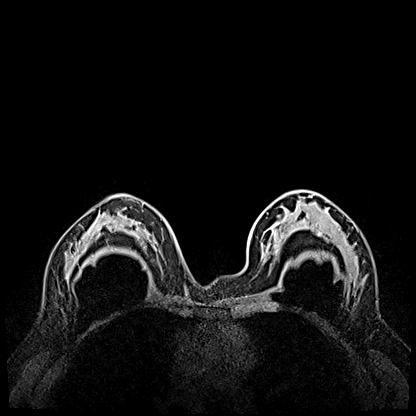
[im 112/112]
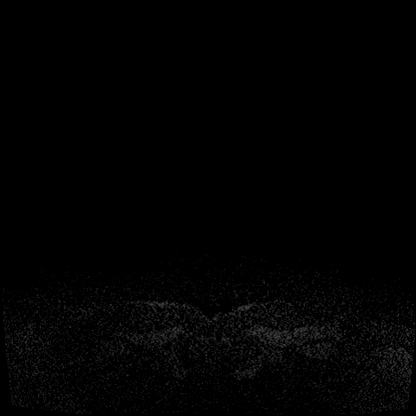

[Series 7: T1 fat-sat · axial · 1.6mm · 0.75mm/px · z∈[-60,+118]mm · 5 of 112 slices shown (3 of 4)]
[im 1/112]
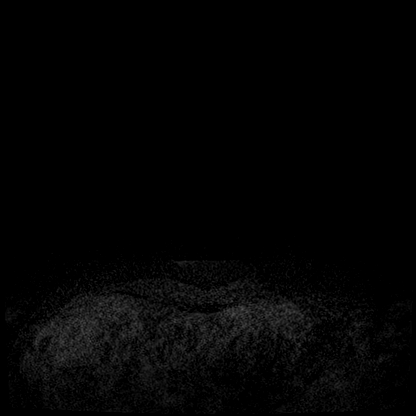
[im 28/112]
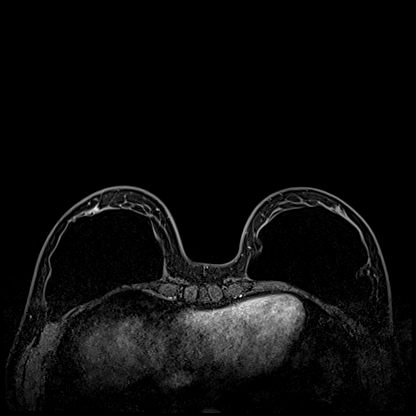
[im 56/112]
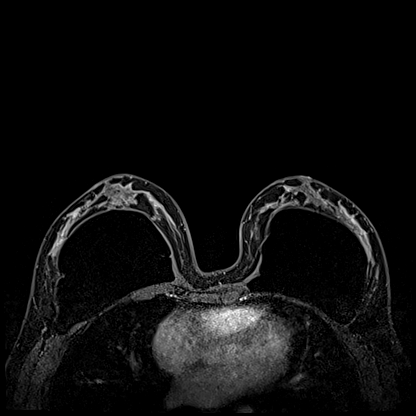
[im 84/112]
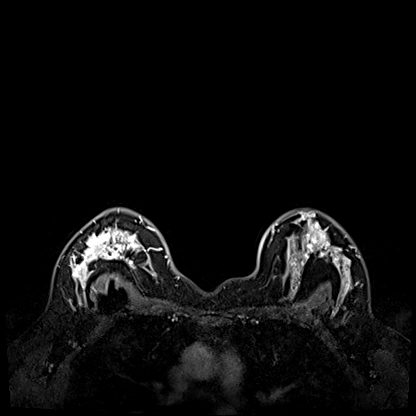
[im 112/112]
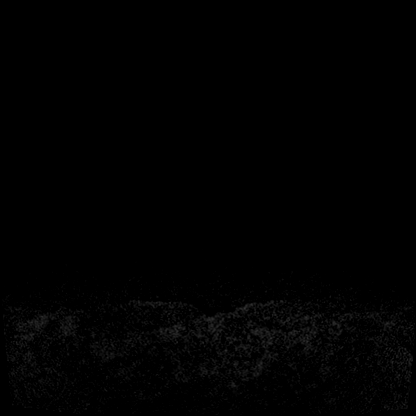

[Series 8: T1 · axial · 1.6mm · 0.75mm/px · z∈[-60,+118]mm · 5 of 112 slices shown (1 of 3)]
[im 1/112]
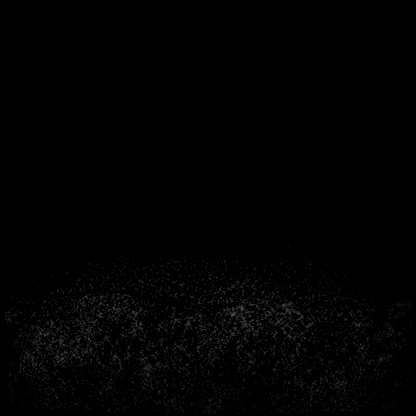
[im 28/112]
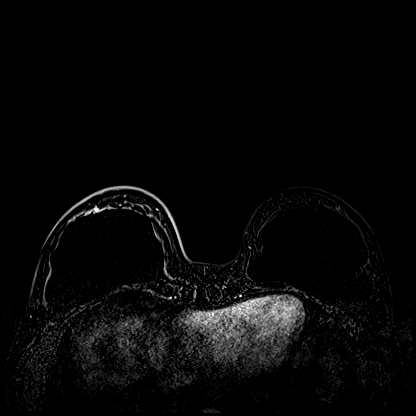
[im 56/112]
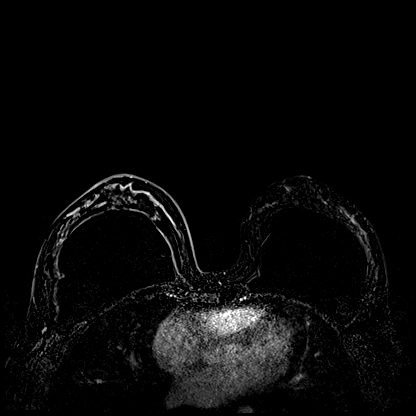
[im 84/112]
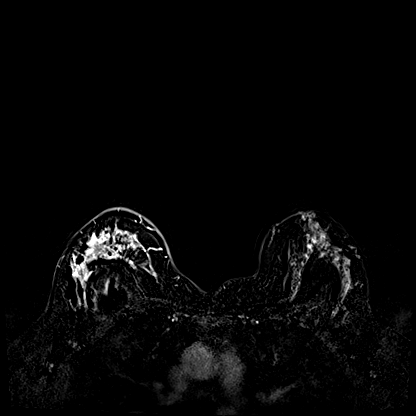
[im 112/112]
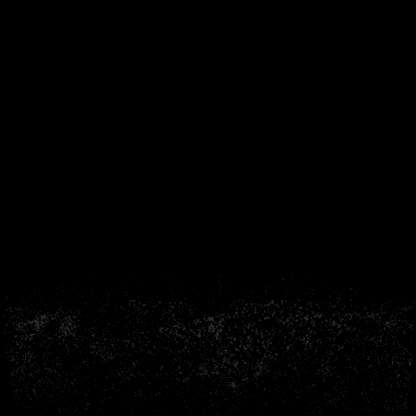

[Series 9: T1 · coronal · 310.0mm · 0.75mm/px · 1 of 3 slices shown (2 of 3)]
[im 1/3]
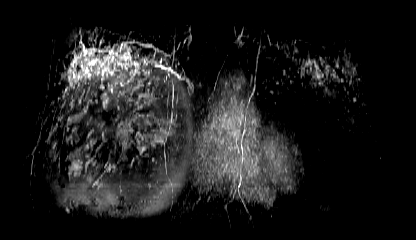

[Series 10: T1 · axial · 179.2mm · 0.75mm/px · 1 of 3 slices shown (3 of 3)]
[im 1/3]
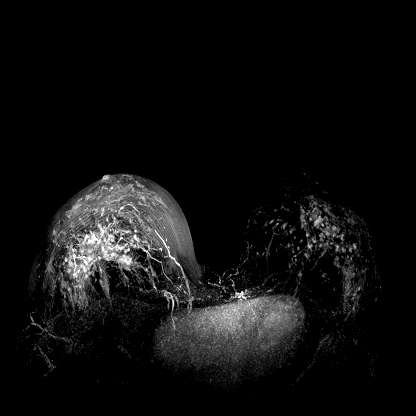

[Series 11: T1 fat-sat · axial · 1.6mm · 0.75mm/px · z∈[-60,+28]mm · 3 of 112 slices shown (4 of 4)]
[im 1/112]
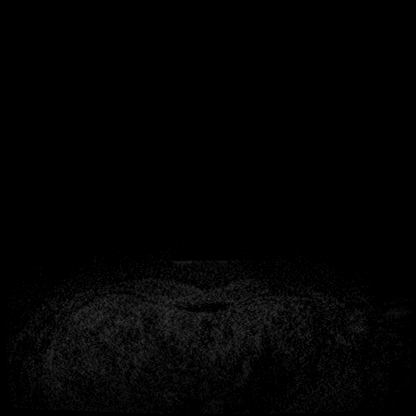
[im 28/112]
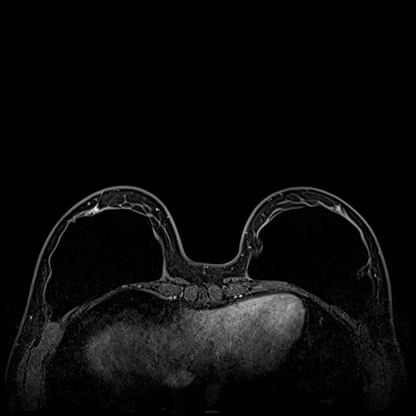
[im 56/112]
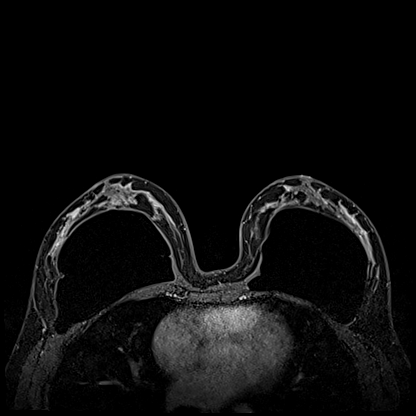

[29 of 48 positions shown; findings below may reference images not displayed]

Three-dimensional MR images were rendered by post-processing of the
original MR data on an independent workstation. The
three-dimensional MR images were interpreted, and findings are
reported in the following complete MRI report for this study. Three
dimensional images were evaluated at the independent interpreting
workstation using the DynaCAD thin client.
FINDINGS: Breast composition: d. Extreme fibroglandular tissue.

Background parenchymal enhancement: Moderate to marked.

Right breast: Large area of non-mass enhancement in the UPPER OUTER
QUADRANT that measures approximately 4.2 x 4.1 x 2.4 cm. This
includes the biopsy-proven IDC and DCIS, and the susceptibility
artifact from the clip is at the superomedial aspect of the NME.

Intact retropectoral implant with multiple folds.

Left breast: No suspicious mass or abnormal enhancement. Intact
retropectoral implant with multiple folds.

Lymph nodes: 2 pathologically enlarged lymph nodes involving the
RIGHT axilla, one of which is biopsy-proven metastatic disease. No
pathologic lymphadenopathy elsewhere.

Ancillary findings:  None.
IMPRESSION: 1. Large area of non-mass enhancement involving the UPPER OUTER
QUADRANT of the RIGHT breast measuring approximately 4.2 x 4.1 x
cm. The biopsy-proven IDC and DCIS is present at the superomedial
aspect of this NME.
2. No MRI evidence of malignancy involving the LEFT breast.
3. Intact BILATERAL retropectoral implants.
4. 2 pathologically enlarged RIGHT axillary lymph nodes. One of
these nodes is biopsy-proven metastatic disease. No pathologic
lymphadenopathy elsewhere.

RECOMMENDATION:
MRI guided biopsy of the LATERAL aspect of the NME in the UPPER
OUTER QUADRANT of the RIGHT breast.

BI-RADS CATEGORY  5: Highly suggestive of malignancy.

## 2019-11-25 MED ORDER — GADOBUTROL 1 MMOL/ML IV SOLN
6.0000 mL | Freq: Once | INTRAVENOUS | Status: AC | PRN
  Administered 2019-11-25: 6 mL via INTRAVENOUS

## 2019-11-25 MED ORDER — FLUDEOXYGLUCOSE F - 18 (FDG) INJECTION: 6.4000 | Freq: Once | INTRAVENOUS | Status: DC | PRN

## 2019-11-26 ENCOUNTER — Telehealth: Payer: Self-pay | Admitting: Genetic Counselor

## 2019-11-26 ENCOUNTER — Encounter: Payer: Self-pay | Admitting: Genetic Counselor

## 2019-11-26 DIAGNOSIS — Z1379 Encounter for other screening for genetic and chromosomal anomalies: Secondary | ICD-10-CM | POA: Insufficient documentation

## 2019-11-26 NOTE — Telephone Encounter (Signed)
Contacted patient in attempt to disclose results of genetic testing.  LVM with contact information requesting a call back.  

## 2019-11-26 NOTE — Telephone Encounter (Signed)
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  Discussed that we do not know why she has breast cancer or why there is cancer in the family. It could be sporadic, due to a different gene that we are not testing, or maybe our current technology may not be able to pick something up.  It will be important for her to keep in contact with genetics to keep up with whether additional testing may be needed.  Results pending for [facility] Multi-Cancer Panel.

## 2019-11-27 ENCOUNTER — Telehealth: Payer: Self-pay | Admitting: *Deleted

## 2019-11-27 ENCOUNTER — Encounter: Payer: Self-pay | Admitting: *Deleted

## 2019-11-27 NOTE — Telephone Encounter (Signed)
Received notification that there is insufficient tissue to run mammaprint on core bx.

## 2019-11-27 NOTE — Telephone Encounter (Signed)
Called pt, left vm regarding [facility] from 11.3.21. Contact information provided for questions or needs. Informed PET scan negative and not need for further bx. Informed physician team that there is insufficient tissue to run mammaprint on core bx.

## 2019-11-28 ENCOUNTER — Other Ambulatory Visit: Payer: Self-pay

## 2019-11-28 ENCOUNTER — Encounter: Payer: Self-pay | Admitting: General Practice

## 2019-11-28 ENCOUNTER — Telehealth: Payer: Self-pay

## 2019-11-28 DIAGNOSIS — Z803 Family history of malignant neoplasm of breast: Secondary | ICD-10-CM

## 2019-11-28 DIAGNOSIS — C50411 Malignant neoplasm of upper-outer quadrant of right female breast: Secondary | ICD-10-CM

## 2019-11-28 NOTE — Telephone Encounter (Signed)
I left a vm with date and time for additional breast biopsy at the breast center, 12/09/2019 at 0730

## 2019-11-28 NOTE — Progress Notes (Signed)
[facility] Spiritual Care Note  Followed up by phone, catching husband Rachid, whom I met at [facility]. He reports that Charlize is anxious to have surgery to remove the cancer; affirmed that this is a very common feeling! Other than waiting for the scheduling call, he reports, they are coping well. Left message of encouragement for [place]. Per Rachid, they plan to phone chaplain on Monday to follow up.   [place], [place], [facility] Pager [phone] Voicemail [phone]

## 2019-12-01 ENCOUNTER — Other Ambulatory Visit: Payer: Self-pay | Admitting: Hematology

## 2019-12-01 MED ORDER — TAMOXIFEN CITRATE 20 MG PO TABS: 20.0000 mg | ORAL_TABLET | Freq: Every day | ORAL | 2 refills | Status: DC

## 2019-12-02 ENCOUNTER — Telehealth: Payer: Self-pay | Admitting: *Deleted

## 2019-12-02 ENCOUNTER — Encounter: Payer: Self-pay | Admitting: Hematology

## 2019-12-02 NOTE — Telephone Encounter (Signed)
Received call from pt's husband asking for earlier appt for biopsy.  Informed per Dr Burr Medico that they are working on this but nothing available so far unless there is a cancellation. Informed that someone will call they if we can get earlier.

## 2019-12-03 ENCOUNTER — Other Ambulatory Visit (HOSPITAL_COMMUNITY): Payer: 59

## 2019-12-03 ENCOUNTER — Other Ambulatory Visit: Payer: Self-pay | Admitting: Hematology

## 2019-12-03 ENCOUNTER — Telehealth: Payer: Self-pay

## 2019-12-03 ENCOUNTER — Encounter (HOSPITAL_COMMUNITY): Payer: 59

## 2019-12-03 MED ORDER — ONDANSETRON 4 MG PO TBDP: 4.0000 mg | ORAL_TABLET | Freq: Three times a day (TID) | ORAL | 0 refills | Status: DC | PRN

## 2019-12-03 NOTE — Telephone Encounter (Signed)
I called back and spoke with pt's husband. Both and her husband are very anxious and would like to have her second biopsy to be done ASAP (currently scheduled for 11/23). We have called [facility] and she is on waiting list if there is a cancellation. I will check with them again tomorrow. She has started with Tamoxifen yesterday. She had nausea with meds, I will call in zofran for her, and she knows to take after meal. She does not want to have mastectomy, and want to have neoadjuvant therapy to shrink her tumor. Waiting to get more tissue for mammaprint test.   Truitt Merle MD

## 2019-12-03 NOTE — Telephone Encounter (Signed)
Ms Matsushita Husband, Rachid, left vm stating they want to go ahead and schedule surgery this week or next.

## 2019-12-04 ENCOUNTER — Encounter: Payer: Self-pay | Admitting: *Deleted

## 2019-12-04 ENCOUNTER — Other Ambulatory Visit: Payer: Self-pay | Admitting: Hematology

## 2019-12-04 ENCOUNTER — Telehealth: Payer: Self-pay | Admitting: *Deleted

## 2019-12-04 ENCOUNTER — Other Ambulatory Visit: Payer: Self-pay

## 2019-12-04 ENCOUNTER — Other Ambulatory Visit: Payer: Self-pay | Admitting: Plastic Surgery

## 2019-12-04 DIAGNOSIS — Z170 Estrogen receptor positive status [ER+]: Secondary | ICD-10-CM

## 2019-12-04 DIAGNOSIS — C50411 Malignant neoplasm of upper-outer quadrant of right female breast: Secondary | ICD-10-CM

## 2019-12-04 DIAGNOSIS — Z803 Family history of malignant neoplasm of breast: Secondary | ICD-10-CM

## 2019-12-04 NOTE — Telephone Encounter (Signed)
Called pt and spoke to husband Rachid. Informed bx will be today 11/18 or tomorrow 11/19 and will be called with an appt date and time. Discussed pt to start taking 10mg  of Tamoxifen instead of 20mg  per Dr. Burr Medico and will receive a call for an appt on 11/23 for f/u and injection. Received verbal understanding. Denies further needs or questions at this time.

## 2019-12-04 NOTE — Telephone Encounter (Signed)
Called pt with appt for [place] bx at [facility] on 11/19 at 3:15pm. Per pt and husband, unable to make that appt d/t childcare and would like to keep appt on 11/23. Dr. Burr Medico notified [facility] notified to keep appt as is and not schedule at [facility] No further needs voiced.

## 2019-12-05 ENCOUNTER — Telehealth: Payer: Self-pay | Admitting: Hematology

## 2019-12-05 NOTE — Telephone Encounter (Signed)
Scheduled per los. Called and spoke with patients husband. Confirmed appt for 11/23

## 2019-12-09 ENCOUNTER — Inpatient Hospital Stay (HOSPITAL_BASED_OUTPATIENT_CLINIC_OR_DEPARTMENT_OTHER): Payer: 59 | Admitting: Hematology

## 2019-12-09 ENCOUNTER — Ambulatory Visit
Admission: RE | Admit: 2019-12-09 | Discharge: 2019-12-09 | Disposition: A | Discharge status: Home / Self Care | Payer: 59 | Source: Ambulatory Visit | Attending: Hematology | Admitting: Hematology

## 2019-12-09 ENCOUNTER — Other Ambulatory Visit: Payer: Self-pay

## 2019-12-09 ENCOUNTER — Inpatient Hospital Stay: Payer: 59

## 2019-12-09 ENCOUNTER — Telehealth: Payer: Self-pay | Admitting: Genetic Counselor

## 2019-12-09 ENCOUNTER — Encounter: Payer: Self-pay | Admitting: Hematology

## 2019-12-09 ENCOUNTER — Ambulatory Visit
Admission: RE | Admit: 2019-12-09 | Discharge: 2019-12-09 | Disposition: A | Discharge status: Home / Self Care | Payer: 59 | Source: Ambulatory Visit | Attending: Plastic Surgery | Admitting: Plastic Surgery

## 2019-12-09 VITALS — BP 119/72 | HR 74 | Temp 99.0°F | Resp 17 | Ht 62.0 in | Wt 133.3 lb

## 2019-12-09 DIAGNOSIS — Z170 Estrogen receptor positive status [ER+]: Secondary | ICD-10-CM

## 2019-12-09 DIAGNOSIS — C50411 Malignant neoplasm of upper-outer quadrant of right female breast: Secondary | ICD-10-CM | POA: Diagnosis not present

## 2019-12-09 DIAGNOSIS — E559 Vitamin D deficiency, unspecified: Secondary | ICD-10-CM | POA: Diagnosis not present

## 2019-12-09 DIAGNOSIS — M1990 Unspecified osteoarthritis, unspecified site: Secondary | ICD-10-CM

## 2019-12-09 DIAGNOSIS — Z803 Family history of malignant neoplasm of breast: Secondary | ICD-10-CM

## 2019-12-09 LAB — VITAMIN D 25 HYDROXY (VIT D DEFICIENCY, FRACTURES): Vit D, 25-Hydroxy: 30.12 ng/mL (ref 30–100)

## 2019-12-09 LAB — SEDIMENTATION RATE: Sed Rate: 6 mm/hr (ref 0–22)

## 2019-12-09 IMAGING — MG MM BREAST LOCALIZATION CLIP
5 series · 6 of 13 positions shown · non-contrast
Comparison: Previous exam(s).

CLINICAL DATA: Evaluate COIL clip placement following re-biopsy of
recently diagnosed RIGHT breast cancer to obtain more tissue for
MammaPrint. Patient has a RIBBON clip at the site of recently
biopsy-proven malignancy.

EXAM:
DIAGNOSTIC RIGHT MAMMOGRAM POST ULTRASOUND BIOPSY

[R CC]
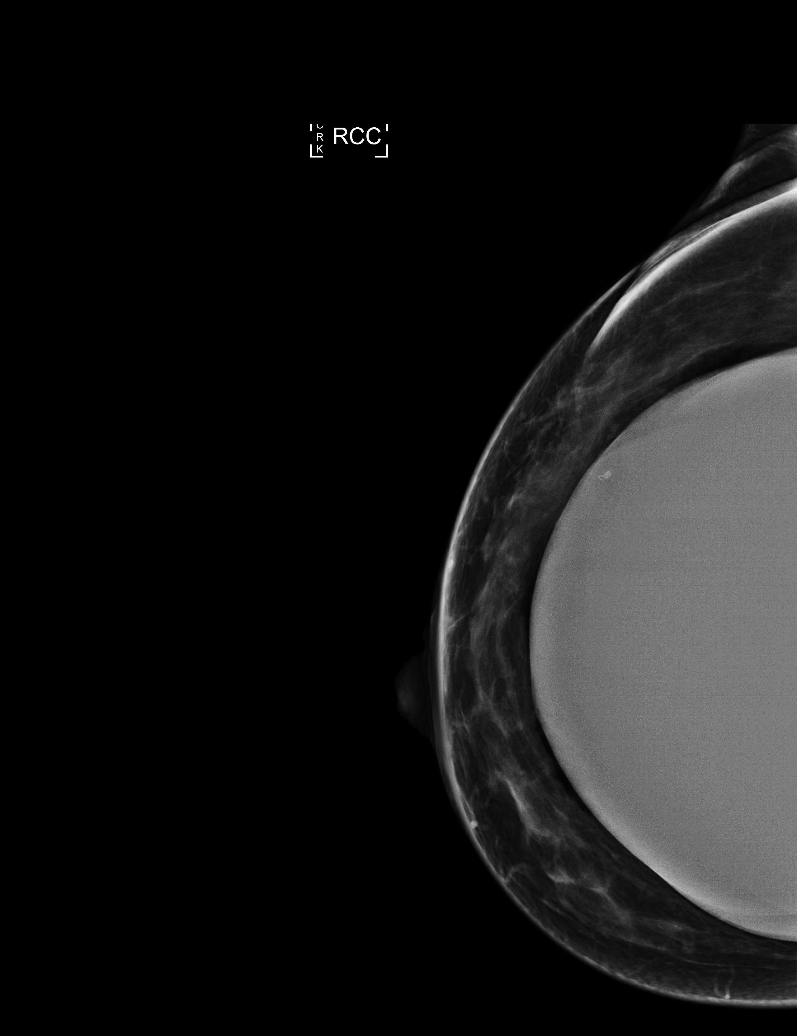

[R CC synth-2D]
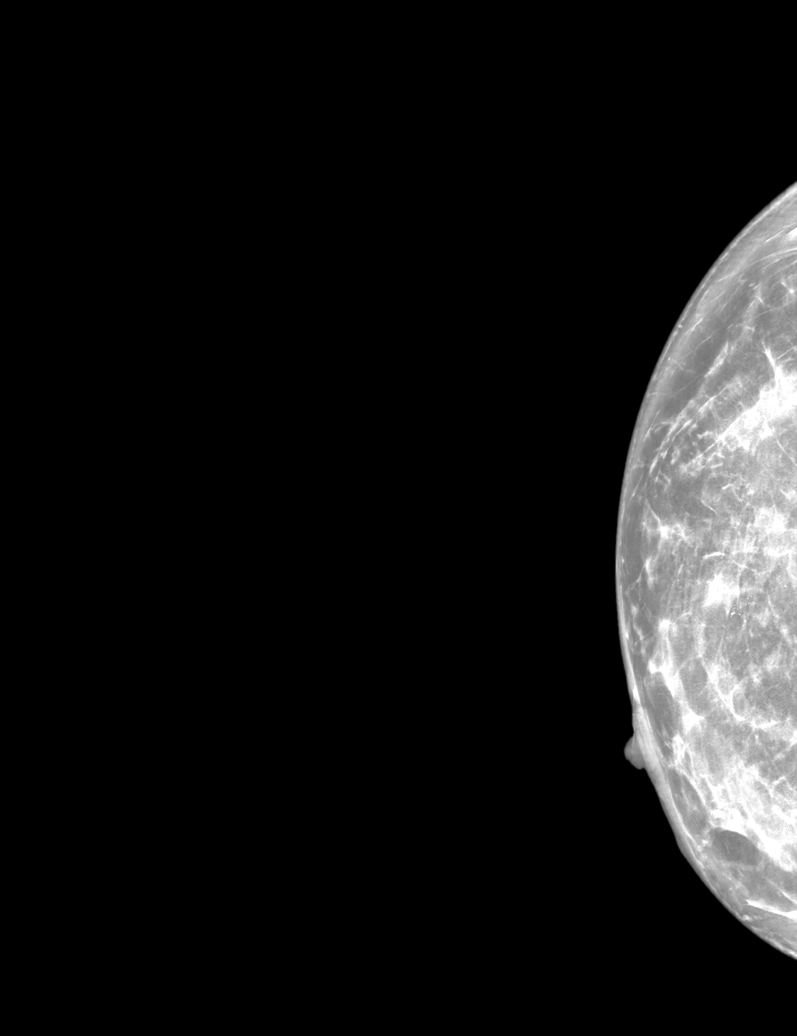

[R ML synth-2D]
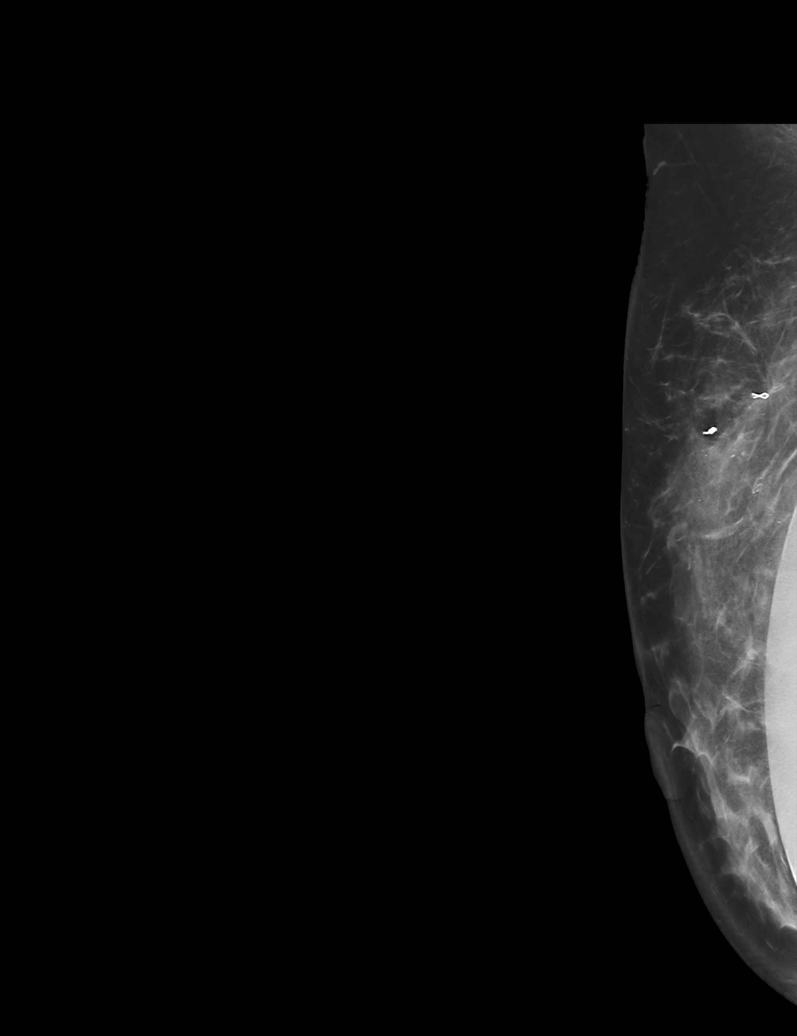

[R CC tomo · 2 of 65 frames shown]
[frame 21/65]
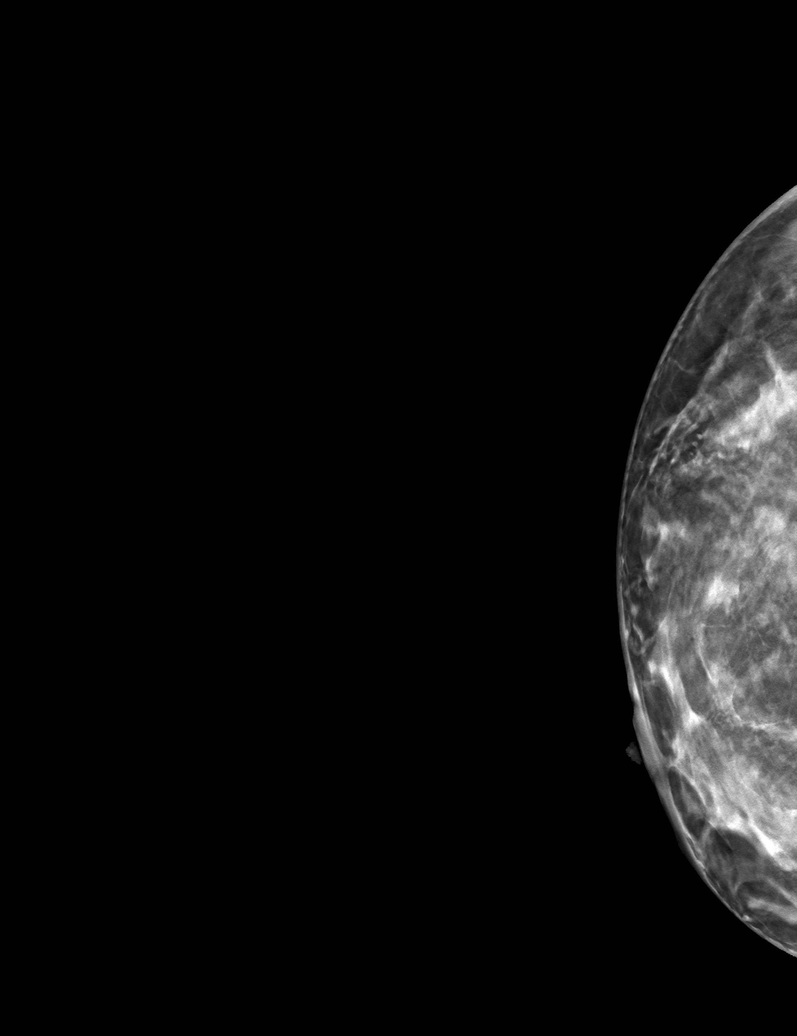
[frame 33/65]
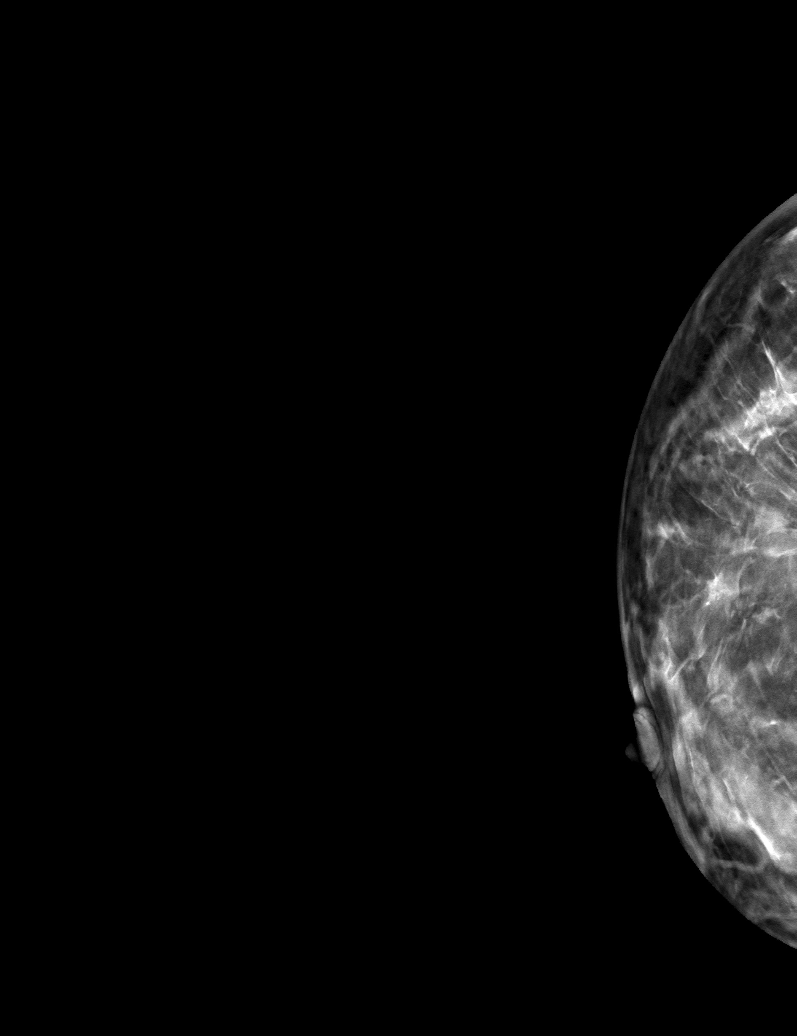

[R ML tomo · tomo slice 40/79.0]
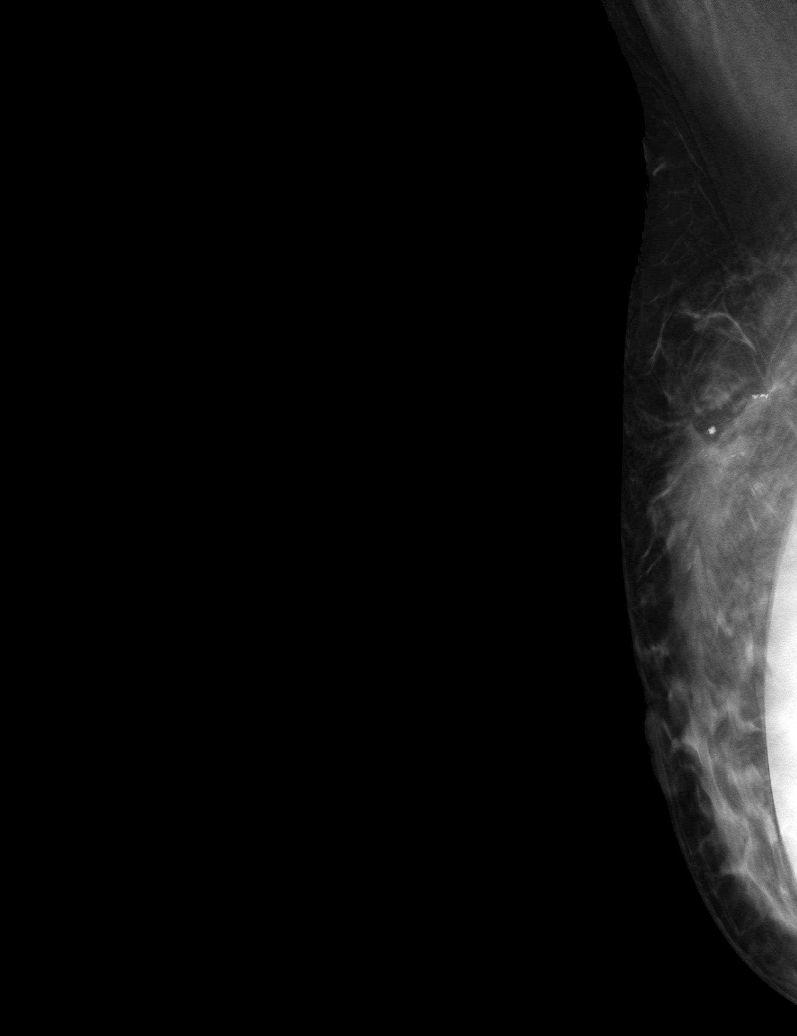

[6 of 13 positions shown; findings below may reference images not displayed]

FINDINGS: Mammographic images were obtained following ultrasound guided biopsy
of known malignancy/mass at the 11 o'clock position of the RIGHT
breast.

The COIL biopsy marking clip is in expected position at the site of
biopsy.

The COIL clip (biopsy today) and RIBBON clip (biopsy [DATE]
demonstrating malignancy) are separated by a distance of 1 cm.
IMPRESSION: Appropriate positioning of the COIL shaped biopsy marking clip at
the site of biopsy in the UPPER OUTER RIGHT breast.

Final Assessment: Post Procedure Mammograms for Marker Placement

## 2019-12-09 IMAGING — US US  BREAST BX W/ LOC DEV 1ST LESION IMG BX SPEC US GUIDE*R*
1 series · 12 of 16 positions shown · non-contrast
Comparison: Previous exam(s).
COMPARISON: Previous exam(s).

Addendum:
CLINICAL DATA: 34-year-old female for re-biopsy of recently
diagnosed UPPER-OUTER RIGHT breast cancer to obtain more tissue for
MammaPrint. On recent MRI, the apparent area of malignancy/non
masslike enhancement measuring up to 4.2 cm with recommendation of
MR guided biopsy of the LATERAL aspect of the non masslike
enhancement to demonstrate extent of disease, if breast conservation
is desired. On sonographic evaluation today, the inferolateral
aspect of non masslike enhancement on recent MR cannot be
confidently visualized.

EXAM:
ULTRASOUND GUIDED RIGHT BREAST CORE NEEDLE BIOPSY

[Series 1: us breast bx w/ loc dev 1st lesion img bx spec us  · 0.06mm/px · 12 of 16 slices shown]
[im 1/16]
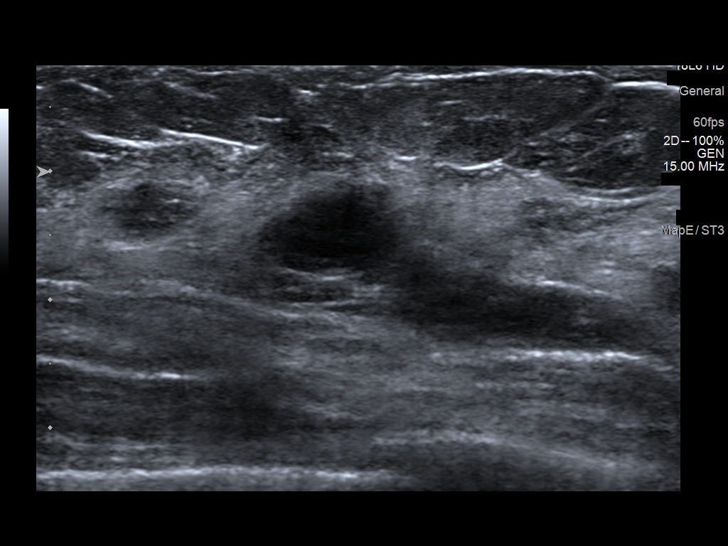
[im 3/16]
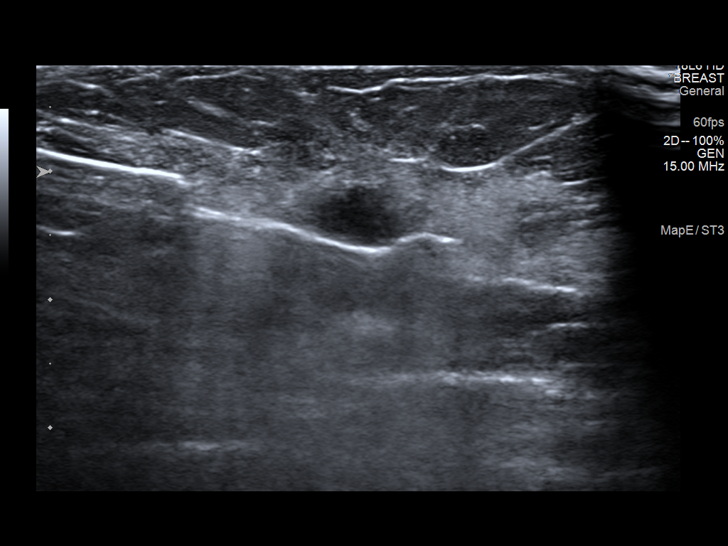
[im 4/16]
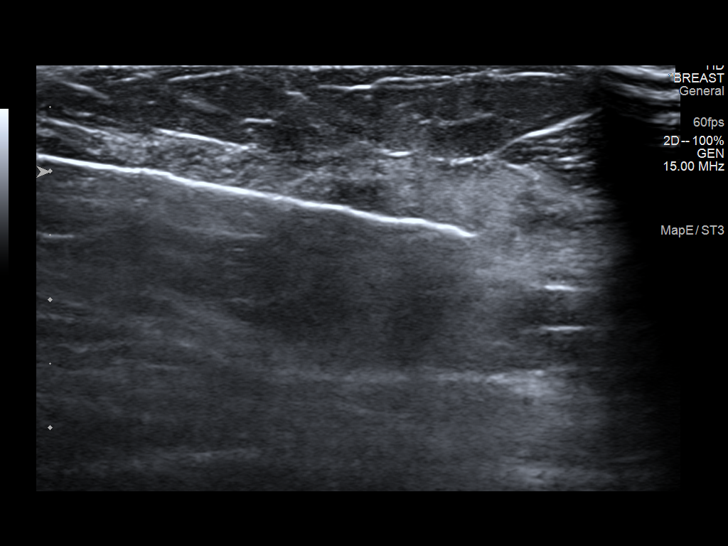
[im 5/16]
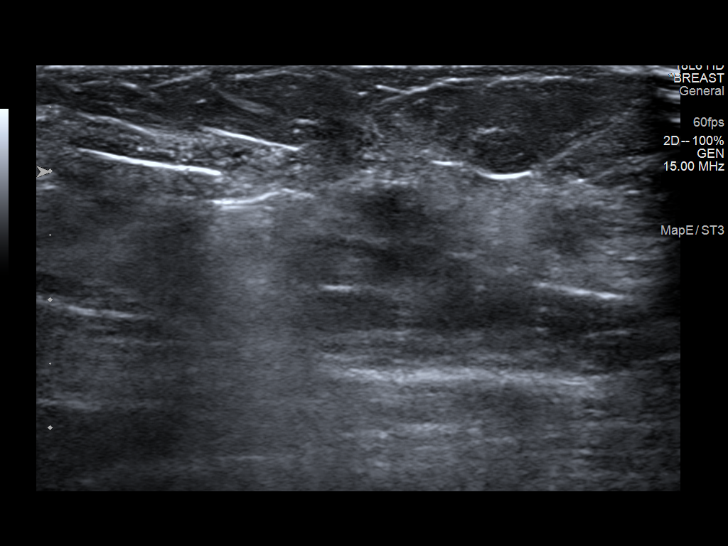
[im 7/16]
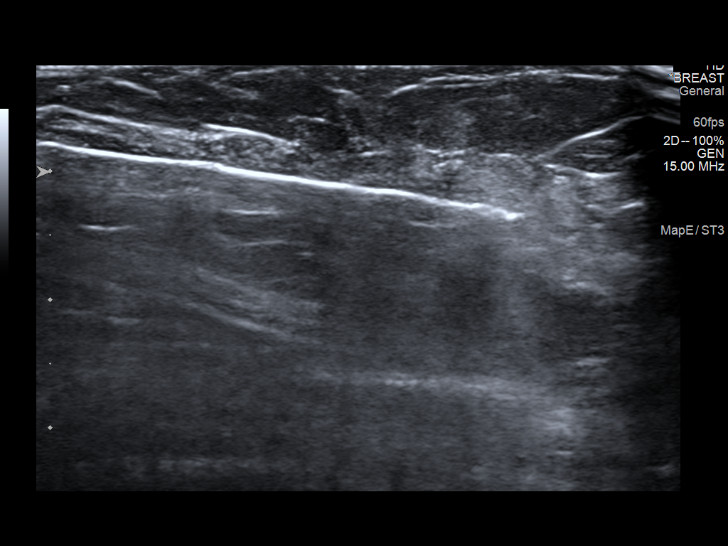
[im 8/16]
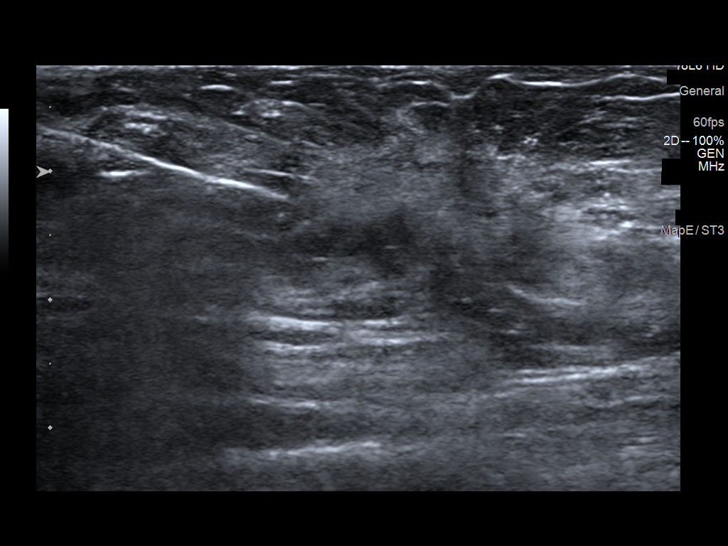
[im 9/16]
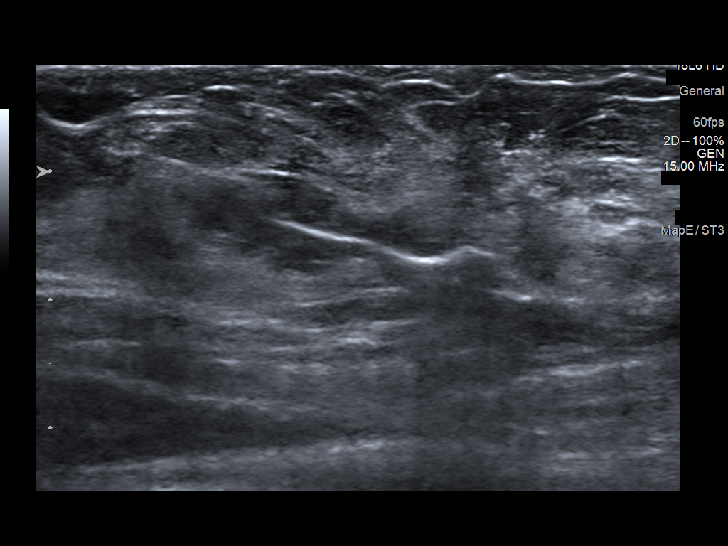
[im 11/16]
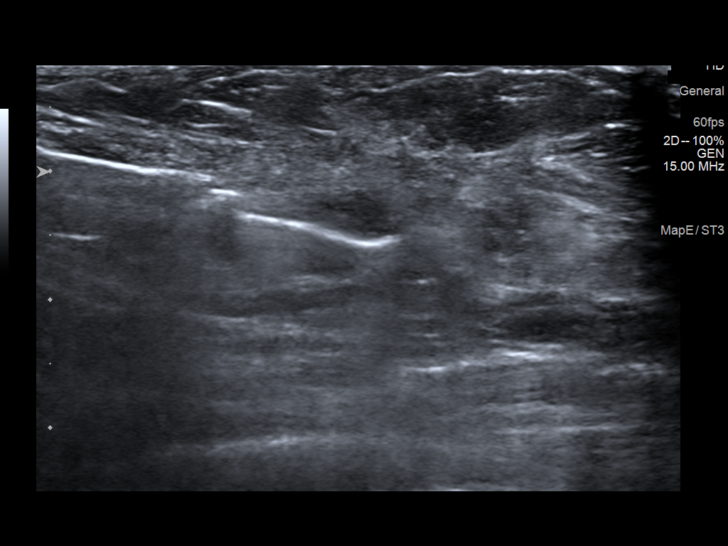
[im 12/16]
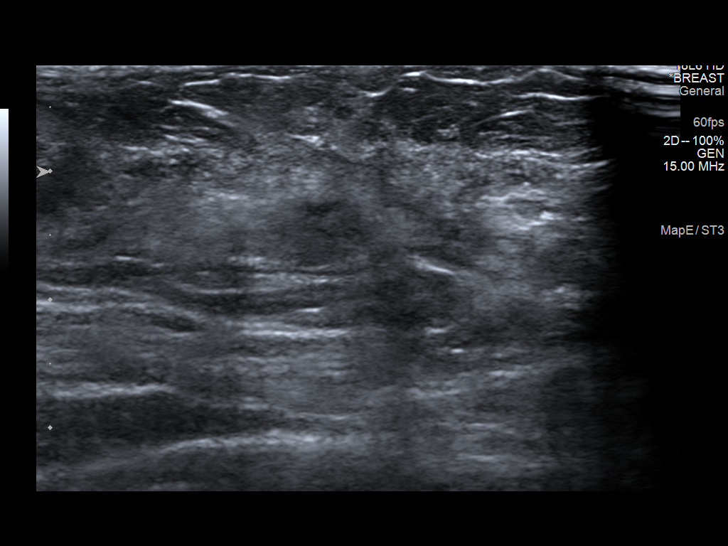
[im 13/16]
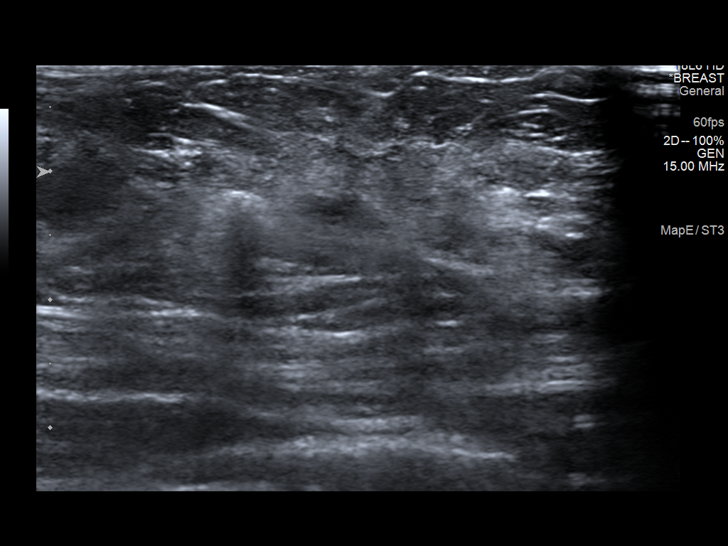
[im 15/16]
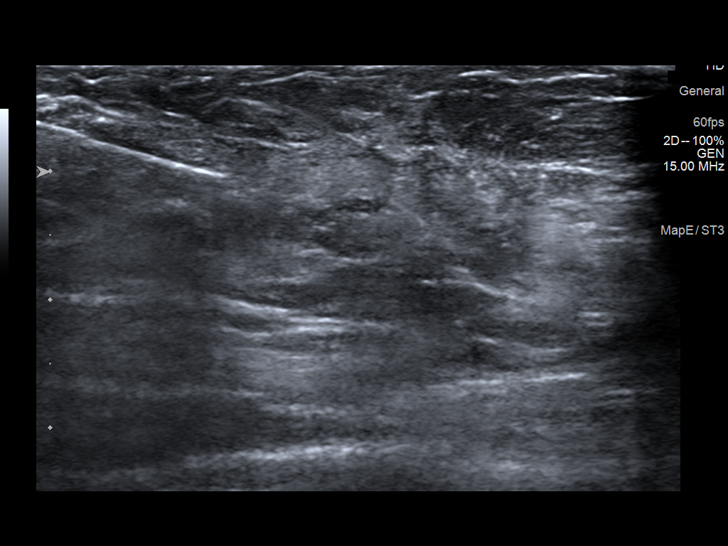
[im 16/16]
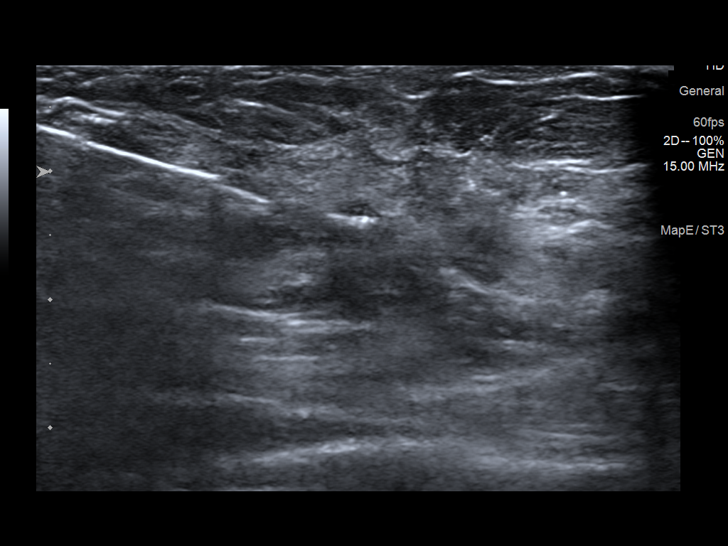

[12 of 16 positions shown; findings below may reference images not displayed]

FINDINGS: I met with the patient and we discussed the procedure of
ultrasound-guided biopsy, including benefits and alternatives. We
discussed the high likelihood of a successful procedure. We
discussed the risks of the procedure, including implant damage,
infection, bleeding, tissue injury, clip migration, and inadequate
sampling. Informed written consent was given. The usual time-out
protocol was performed immediately prior to the procedure.

Lesion quadrant: UPPER-OUTER RIGHT breast

Using sterile technique and 1% Lidocaine as local anesthetic, under
direct ultrasound visualization, a 12 gauge AARONS device was
used to perform biopsy of the irregular hypoechoic mass at the 11
o'clock position of the RIGHT breast using a MEDIAL approach, with
multiple core specimens obtained. At the conclusion of the procedure
a COIL tissue marker clip was deployed into the biopsy cavity.
Follow up 2 view mammogram was performed and dictated separately.
IMPRESSION: Ultrasound-guided re-biopsy of UPPER-OUTER RIGHT breast mass/cancer
at the 11 o'clock position to obtain more tissue for MammaPrint with
COIL clip placed. No apparent complications.

As per prior MRI, recommend MR guided biopsy of the inferolateral
aspect of RIGHT breast non masslike enhancement to document extent
of disease, if breast conservation is desired. This extent of MR non
masslike enhancement cannot be confidently visualized on ultrasound
today.

ADDENDUM:
Ultrasound-guided re-biopsy of UPPER-OUTER RIGHT breast mass/cancer
at the 11 o'clock position to obtain more tissue for MammaPrint.

Pathology revealed GRADE I INVASIVE MAMMARY CARCINOMA, MAMMARY
CARCINOMA IN-SITU of the RIGHT breast, upper outer. This was found
to be concordant by Dr. AARONS.

Dr. AARONS notified of biopsy results via [REDACTED] message on AARONS

Pathology results reported by AARONS, RN on [DATE].

*** End of Addendum ***
FINDINGS: I met with the patient and we discussed the procedure of
ultrasound-guided biopsy, including benefits and alternatives. We
discussed the high likelihood of a successful procedure. We
discussed the risks of the procedure, including implant damage,
infection, bleeding, tissue injury, clip migration, and inadequate
sampling. Informed written consent was given. The usual time-out
protocol was performed immediately prior to the procedure.

Lesion quadrant: UPPER-OUTER RIGHT breast

Using sterile technique and 1% Lidocaine as local anesthetic, under
direct ultrasound visualization, a 12 gauge AARONS device was
used to perform biopsy of the irregular hypoechoic mass at the 11
o'clock position of the RIGHT breast using a MEDIAL approach, with
multiple core specimens obtained. At the conclusion of the procedure
a COIL tissue marker clip was deployed into the biopsy cavity.
Follow up 2 view mammogram was performed and dictated separately.
IMPRESSION: Ultrasound-guided re-biopsy of UPPER-OUTER RIGHT breast mass/cancer
at the 11 o'clock position to obtain more tissue for MammaPrint with
COIL clip placed. No apparent complications.

As per prior MRI, recommend MR guided biopsy of the inferolateral
aspect of RIGHT breast non masslike enhancement to document extent
of disease, if breast conservation is desired. This extent of MR non
masslike enhancement cannot be confidently visualized on ultrasound
today.

## 2019-12-09 MED ORDER — GOSERELIN ACETATE 3.6 MG ~~LOC~~ IMPL
3.6000 mg | DRUG_IMPLANT | Freq: Once | SUBCUTANEOUS | Status: AC
  Administered 2019-12-09: 3.6 mg via SUBCUTANEOUS

## 2019-12-09 MED ORDER — TAMOXIFEN CITRATE 10 MG PO TABS: 10.0000 mg | ORAL_TABLET | Freq: Two times a day (BID) | ORAL | 0 refills | Status: DC

## 2019-12-09 MED ORDER — GOSERELIN ACETATE 3.6 MG ~~LOC~~ IMPL
DRUG_IMPLANT | SUBCUTANEOUS | Status: AC
  Filled 2019-12-09: qty 3.6

## 2019-12-09 MED ORDER — VENLAFAXINE HCL 37.5 MG PO TABS: 37.5000 mg | ORAL_TABLET | Freq: Two times a day (BID) | ORAL | 0 refills | Status: DC

## 2019-12-09 NOTE — Patient Instructions (Signed)

## 2019-12-09 NOTE — Progress Notes (Signed)
Caitlyn Miller   [phone] [phone]   Clinic Follow up Note   Patient Care Team: Rutherford Guys, MD (Inactive) as PCP - General (Family Medicine) Mansouraty, Telford Nab., MD as Consulting Physician (Gastroenterology) Mauro Kaufmann, RN as Oncology Nurse Navigator Rockwell Germany, RN as Oncology Nurse Navigator Donnie Mesa, MD as Consulting Physician (General Surgery) Truitt Merle, MD as Consulting Physician (Hematology) Kyung Rudd, MD as Consulting Physician (Radiation Oncology) Contogiannis, Audrea Muscat, MD as Consulting Physician (Plastic Surgery)  Date of Service:  12/09/2019  CHIEF COMPLAINT: F/u of right breast cancer   SUMMARY OF ONCOLOGIC HISTORY: Oncology History Overview Note  Cancer Staging Malignant neoplasm of upper-outer quadrant of right breast in female, estrogen receptor positive ([place]) Staging form: Breast, AJCC 8th Edition - Clinical stage from 11/12/2019: Stage IIA (cT2, cN1, cM0, G2, ER+, PR+, HER2-) - Signed by Truitt Merle, MD on 11/19/2019    Malignant neoplasm of upper-outer quadrant of right breast in female, estrogen receptor positive ([place])  11/05/2019 Mammogram   IMPRESSION: Large irregular palpable mass 4.7 x 1.4 x 1.4 cm within the upper-outer right breast 11 o'clock position, 4 cm from nipple.  There is a large area of associated coarse heterogeneous calcifications within the mass. Overall findings are concerning for breast carcinoma.   Two cortically thickened right axillary lymph nodes which are indeterminate in etiology.   11/12/2019 Cancer Staging   Staging form: Breast, AJCC 8th Edition - Clinical stage from 11/12/2019: Stage IIA (cT2, cN1, cM0, G2, ER+, PR+, HER2-) - Signed by Truitt Merle, MD on 11/19/2019   11/12/2019 Initial Biopsy   Diagnosis 1. Breast, right, needle core biopsy, right - INVASIVE MAMMARY CARCINOMA, SEE COMMENT. - MAMMARY CARCINOMA IN SITU. 2. Lymph node, needle/core biopsy, right -  METASTATIC MAMMARY CARCINOMA. Microscopic Comment 1. The carcinoma appears grade 2 and measures 16 mm in greatest linear extent. E-cadherin will be ordered. Prognostic makers will be ordered. Dr. Saralyn Pilar has reviewed the case. The case was called to [facility] on 01/13/2020.    1. E-cadherin is strongly positive consistent with a ductal phenotype.   11/12/2019 Receptors her2   1. PROGNOSTIC INDICATORS Results: IMMUNOHISTOCHEMICAL AND MORPHOMETRIC ANALYSIS PERFORMED MANUALLY The tumor cells are NEGATIVE for Her2 (0). Estrogen Receptor: 100%, POSITIVE, STRONG STAINING INTENSITY Progesterone Receptor: 100%, POSITIVE, STRONG STAINING INTENSITY Proliferation Marker Ki67: 5%   11/17/2019 Initial Diagnosis   Malignant neoplasm of upper-outer quadrant of right breast in female, estrogen receptor positive ([place])   11/25/2019 Breast MRI   IMPRESSION: 1. Large area of non-mass enhancement involving the UPPER OUTER QUADRANT of the RIGHT breast measuring approximately 4.2 x 4.1 x 2.4 cm. The biopsy-proven IDC and DCIS is present at the superomedial aspect of this NME. 2. No MRI evidence of malignancy involving the LEFT breast. 3. Intact BILATERAL retropectoral implants. 4. 2 pathologically enlarged RIGHT axillary lymph nodes. One of these nodes is biopsy-proven metastatic disease. No pathologic lymphadenopathy elsewhere   11/25/2019 PET scan   IMPRESSION: 1. Two mildly enlarged hypermetabolic right axillary lymph nodes compatible with right axillary nodal metastases. 2. No additional sites of hypermetabolic metastatic disease. 3. Asymmetric indistinct upper right breast hypermetabolism, without discrete mass correlate on the CT images, compatible with known primary right breast malignancy.     11/26/2019 Genetic Testing   Negative genetic testing: no pathogenic variants detected in [facility] STAT Breast Cancer Panel.  Variant of uncertain significance (VUS) detected in  CHEK2 at c.1556G>T (p.Arg519Leu).  The report  date is November 26, 2019.   The STAT Breast cancer panel offered by [facility] includes sequencing and rearrangement analysis for the following 9 genes:  ATM, BRCA1, BRCA2, CDH1, CHEK2, PALB2, PTEN, STK11 and TP53.    Results of [facility] Multi-Cancer Panel are pending.    12/01/2019 -  Neo-Adjuvant Anti-estrogen oral therapy   Neoadjuvant Tamoxifen 55m on 12/01/19. Reduced to 170mon 12/09/19.       ---Zoladex injection monthly starting 12/09/19.    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  Variants of uncertain significance detected RAD51D at c.715C>T (p.Arg239Trp) and CHEK2 at c.1556G>T (p.Arg519Leu).  The report date is December 08, 2019.   The Multi-Cancer Panel offered by [facility] includes sequencing and/or deletion duplication testing of the following 85 genes: AIP, ALK, APC, ATM, AXIN2,BAP1,  BARD1, BLM, BMPR1A, BRCA1, BRCA2, BRIP1, CASR, CDC73, CDH1, CDK4, CDKN1B, CDKN1C, CDKN2A (p14ARF), CDKN2A (p16INK4a), CEBPA, CHEK2, CTNNA1, DICER1, DIS3L2, EGFR (c.2369C>T, p.Thr790Met variant only), EPCAM (Deletion/duplication testing only), FH, FLCN, GATA2, GPC3, GREM1 (Promoter region deletion/duplication testing only), HOXB13 (c.251G>A, p.Gly84Glu), HRAS, KIT, MAX, MEN1, MET, MITF (c.952G>A, p.Glu318Lys variant only), MLH1, MSH2, MSH3, MSH6, MUTYH, NBN, NF1, NF2, NTHL1, PALB2, PDGFRA, PHOX2B, PMS2, POLD1, POLE, POT1, PRKAR1A, PTCH1, PTEN, RAD50, RAD51C, RAD51D, RB1, RECQL4, RET, RNF43, RUNX1, SDHAF2, SDHA (sequence changes only), SDHB, SDHC, SDHD, SMAD4, SMARCA4, SMARCB1, SMARCE1, STK11, SUFU, TERC, TERT, TMEM127, TP53, TSC1, TSC2, VHL, WRN and WT1.       CURRENT THERAPY:  Neoadjuvant Tamoxifen 2069maily started on 12/01/19. Reduced to 10m82m 12/09/19 due to poor tolerance   Zoladex injection monthly starting 12/09/19.   INTERVAL HISTORY:    Caitlyn Knightonhere for a follow up. She presents to the clinic with her husband. She notes her right breast biopsy went well and has residual pain. She started neoadjuvant Tamoxifen on 12/01/19. She has been taking full dose and her mild joint pian is much worse. She is willing to take half dose Tamoxifen for 3 weeks. She also notes dry mouth and acne from Tamoxifen. She and her husband are still thinking if she plans to have more children. She does not plan to return to her prior plasPsychiatric nursehe notes a birthmark in her scalp that has been bleeding lately and changing. She plans to see dermatology but not able to be seen until 4-5 months out.    REVIEW OF SYSTEMS:   Constitutional: Denies fevers, chills or abnormal weight loss Eyes: Denies blurriness of vision Ears, nose, mouth, throat, and face: Denies mucositis or sore throat (+) dry mouth  Respiratory: Denies cough, dyspnea or wheezes Cardiovascular: Denies palpitation, chest discomfort or lower extremity swelling Gastrointestinal:  Denies nausea, heartburn or change in bowel habits Skin: Denies abnormal skin rashes (+) Facial acne (+) Birthmark of scalp with mild bleeding and discharge.  MSK: (+) worsened joint pain  Lymphatics: Denies new lymphadenopathy or easy bruising Neurological:Denies numbness, tingling or new weaknesses Behavioral/Psych: Mood is stable, no new changes  All other systems were reviewed with the patient and are negative.  MEDICAL HISTORY:  Past Medical History:  Diagnosis Date  . Abnormal Pap smear   . Acid reflux   . Anemia   . Anxiety   . Breast cancer (HCC)[place]. Decreased appetite 10/08  . Depression   . Endometriosis 10/2004  . Epigastric pain 10/2006  . Family history of breast cancer 11/19/2019  . FH: migraines   .  GBS carrier   . H/O amenorrhea 06/2006  . H/O dyspareunia 7/07  . H/O fatigue   . H/O nausea and vomiting 10/2006  . H/O rubella   . H/O varicella   . Hyperemesis arising  during pregnancy    First pregnancy  . Irregular periods/menstrual cycles 02/2005  . Pelvic pain 09/2008    SURGICAL HISTORY: Past Surgical History:  Procedure Laterality Date  . AUGMENTATION MAMMAPLASTY Bilateral 2992   silicone   . WISDOM TOOTH EXTRACTION      I have reviewed the social history and family history with the patient and they are unchanged from previous note.  ALLERGIES:  is allergic to ibuprofen.  MEDICATIONS:  Current Outpatient Medications  Medication Sig Dispense Refill  . escitalopram (LEXAPRO) 10 MG tablet Take 1 tablet (10 mg total) by mouth at bedtime. 90 tablet 1  . hydrOXYzine (ATARAX/VISTARIL) 25 MG tablet Take 1 tablet (25 mg total) by mouth at bedtime as needed for anxiety. 90 tablet 0  . ondansetron (ZOFRAN ODT) 4 MG disintegrating tablet Take 1 tablet (4 mg total) by mouth every 8 (eight) hours as needed for nausea or vomiting. 30 tablet 0  . pantoprazole (PROTONIX) 40 MG tablet Take 1 tablet (40 mg total) by mouth daily as needed. 30 tablet 5  . tamoxifen (NOLVADEX) 10 MG tablet Take 1 tablet (10 mg total) by mouth 2 (two) times daily. 30 tablet 0  . venlafaxine (EFFEXOR) 37.5 MG tablet Take 1 tablet (37.5 mg total) by mouth 2 (two) times daily. 30 tablet 0   No current facility-administered medications for this visit.    PHYSICAL EXAMINATION: ECOG PERFORMANCE STATUS: 1 - Symptomatic but completely ambulatory  Vitals:   12/09/19 1120  BP: 119/72  Pulse: 74  Resp: 17  Temp: 99 F (37.2 C)  SpO2: 100%   Filed Weights   12/09/19 1120  Weight: 133 lb 4.8 oz (60.5 kg)    GENERAL:alert, no distress and comfortable SKIN: skin color, texture, turgor are normal, no rashes or significant lesions except a small area of alopecia on scalp with mild bleeding and discharge (as pictured below)  EYES: normal, Conjunctiva are pink and non-injected, sclera clear  Musculoskeletal:no cyanosis of digits and no clubbing  NEURO: alert & oriented x 3 with  fluent speech, no focal motor/sensory deficits      LABORATORY DATA:  I have reviewed the data as listed CBC Latest Ref Rng & Units 11/19/2019 05/01/2017 04/12/2017  WBC 4.0 - 10.5 K/uL 6.4 6.4 7.8  Hemoglobin 12.0 - 15.0 g/dL 12.0 12.8 12.3  Hematocrit 36 - 46 % 36.3 39.5 35.8  Platelets 150 - 400 K/uL 281 273 261     CMP Latest Ref Rng & Units 11/19/2019 05/01/2017 04/12/2017  Glucose 70 - 99 mg/dL 72 82 -  BUN 6 - 20 mg/dL 12 7 -  Creatinine 0.44 - 1.00 mg/dL 0.72 0.62 -  Sodium 135 - 145 mmol/L 140 142 -  Potassium 3.5 - 5.1 mmol/L 4.3 4.5 -  Chloride 98 - 111 mmol/L 107 104 -  CO2 22 - 32 mmol/L 27 23 -  Calcium 8.9 - 10.3 mg/dL 9.2 9.8 9.8  Total Protein 6.5 - 8.1 g/dL 7.4 7.4 -  Total Bilirubin 0.3 - 1.2 mg/dL 0.5 0.4 -  Alkaline Phos 38 - 126 U/L 42 40 -  AST 15 - 41 U/L 24 20 -  ALT 0 - 44 U/L 33 23 -      RADIOGRAPHIC STUDIES: I have  personally reviewed the radiological images as listed and agreed with the findings in the report. MM CLIP PLACEMENT RIGHT  Result Date: 12/09/2019 CLINICAL DATA:  Evaluate COIL clip placement following re-biopsy of recently diagnosed RIGHT breast cancer to obtain more tissue for MammaPrint. Patient has a RIBBON clip at the site of recently biopsy-proven malignancy. EXAM: DIAGNOSTIC RIGHT MAMMOGRAM POST ULTRASOUND BIOPSY COMPARISON:  Previous exam(s). FINDINGS: Mammographic images were obtained following ultrasound guided biopsy of known malignancy/mass at the 11 o'clock position of the RIGHT breast. The COIL biopsy marking clip is in expected position at the site of biopsy. The COIL clip (biopsy today) and RIBBON clip (biopsy 11/12/2019 demonstrating malignancy) are separated by a distance of 1 cm. IMPRESSION: Appropriate positioning of the COIL shaped biopsy marking clip at the site of biopsy in the UPPER OUTER RIGHT breast. Final Assessment: Post Procedure Mammograms for Marker Placement Electronically Signed   By: Margarette Canada M.D.   On:  12/09/2019 09:07   [place] RT BREAST BX W LOC DEV 1ST LESION IMG BX SPEC US GUIDE  Result Date: 12/09/2019 CLINICAL DATA:  34 year old female for re-biopsy of recently diagnosed UPPER-OUTER RIGHT breast cancer to obtain more tissue for MammaPrint. On recent MRI, the apparent area of malignancy/non masslike enhancement measuring up to 4.2 cm with recommendation of MR guided biopsy of the LATERAL aspect of the non masslike enhancement to demonstrate extent of disease, if breast conservation is desired. On sonographic evaluation today, the inferolateral aspect of non masslike enhancement on recent MR cannot be confidently visualized. EXAM: ULTRASOUND GUIDED RIGHT BREAST CORE NEEDLE BIOPSY COMPARISON:  Previous exam(s). FINDINGS: I met with the patient and we discussed the procedure of ultrasound-guided biopsy, including benefits and alternatives. We discussed the high likelihood of a successful procedure. We discussed the risks of the procedure, including implant damage, infection, bleeding, tissue injury, clip migration, and inadequate sampling. Informed written consent was given. The usual time-out protocol was performed immediately prior to the procedure. Lesion quadrant: UPPER-OUTER RIGHT breast Using sterile technique and 1% Lidocaine as local anesthetic, under direct ultrasound visualization, a 12 gauge spring-loaded device was used to perform biopsy of the irregular hypoechoic mass at the 11 o'clock position of the RIGHT breast using a MEDIAL approach, with multiple core specimens obtained. At the conclusion of the procedure a COIL tissue marker clip was deployed into the biopsy cavity. Follow up 2 view mammogram was performed and dictated separately. IMPRESSION: Ultrasound-guided re-biopsy of UPPER-OUTER RIGHT breast mass/cancer at the 11 o'clock position to obtain more tissue for MammaPrint with COIL clip placed. No apparent complications. As per prior MRI, recommend MR guided biopsy of the inferolateral  aspect of RIGHT breast non masslike enhancement to document extent of disease, if breast conservation is desired. This extent of MR non masslike enhancement cannot be confidently visualized on ultrasound today. Electronically Signed   By: Margarette Canada M.D.   On: 12/09/2019 08:54     ASSESSMENT & PLAN:  Jametta Moorehead is a 34 y.o. female with   1. Malignant neoplasm of upper-outer quadrant of right breast, Stage IIA, c(T2N1M0), ER+/PR+/HER2-, Grade II -She has a 4.7cm right breast mass at 11:00 position and two abnormal right axillary LN. 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  -I discussed with low Ki 67 and strongly ER/PR positive disease, her cancer is likely low risk disease, but clinically her presented with rapid growth of her tumor, appears aggressive  clinically.  -Her 11/2019 breast MRI and PET scan did not indicate left breast or distant malignancy or metastasis.  -due to her large tumor, she needs mastectomy if she proceeded with surgery first.  She has been seen by Dr. Georgette Dover.  Her mastectomy will be complicated by history of implant placement. -Due to insufficient biopsy tissue, she underwent second right breast biopsy today (12/09/19) to get more tissue for mammaprint sample to determine her risk of recurrence and benefit of chemo. If high risk disease chemotherapy AC-T will be recommended, likely before surgery, to downstage her cancer. She is interested in chemo.  -I have started her on neoadjuvant tamoxifen last week, she is tolerating poorly with nausea, and diffuse body aches. -I will reduce her tamoxifen for the next 2-3 weeks, if she tolerates better then increase back to 29m daily. -I discussed the option of Ovarian suppression, with monthly Zoladex injection first, to reduce her risk of breast recurrence, and also preserve her fertility if she undergo chemotherapy. I reviewed side  effects with her in great detail. She opted to proceed with injection, will start Zoladex today.  -f/u in 2 weeks   2. Generalized Weakness, body pain, Fatigue  -Pt notes for the past 3 years she has had mid chest and mid back pain. Previously intermittent, now constant. She also notes b/l arm and leg weakness and pain along with tingling of her toes and fingers.  -This has lead to fatigue, low appetite, decreased daily function/activity.  -[place]. They said this is related to stress, anxiety or depression. Pt feels she has an illness causing this instead.  -Her 11/25/19 PET was negative for any distant malignancy or metastasis. This is likely focal weakness.  -She has adequate peripheral strength on 11/19/19 exam, no significant tenderness of spine. -Her joint pain and fatigue has increased on Tamoxifen. Will reduce dose to 170m I also encouraged her to be active with exercise.  -Her Vit D level was 11.7 on 04/13/19. I will recheck today. If low I will call in high dose Vit D. I will also check Rheumatoid and other inflammation marker.   3. Anxiety, depression  -Pt notes feeling more anxious (due to health before breast cancer), than depressed.  -She is on Lexapro, which helps some. Given drowsiness she takes half tablet. Will wean her off with half tablet every other day for 1 week. I will switch her to Effexor next week, I called in today (12/09/19).   4. 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   -She is scheduled to meet genetic counselor Cari again later this week -Plan to discuss more on her next visit  5. Social Support  [place]. She has 2 teenage children. [place]  6. Scalp Lesion -She notes  recent bleeding and pain of her Birthmark of scalp.  -She plan to be seen by dermatologist, but is not able to be seen for another 4-5 months. I will see if she is able to be seen sooner.    PLAN:  -Lab today -Wean off Lexapro in next week.  -I called in Effexor 37.5 today. Plan to start next week.  -Proceed with Zoladex injection today  -Continue Tamoxifen and reduce to 1055maily. I refilled today  -  f/u in 2 weeks to review Mammaprint result    No problem-specific Assessment & Plan notes found for this encounter.   Orders Placed This Encounter  Procedures  . Sedimentation rate    Standing Status:   Future    Number of Occurrences:   1    Standing Expiration Date:   12/08/2020  . Vitamin D 25 hydroxy    Standing Status:   Future    Number of Occurrences:   1    Standing Expiration Date:   12/08/2020  . ANA, IFA (with reflex)    Standing Status:   Future    Number of Occurrences:   1    Standing Expiration Date:   12/08/2020  . Rheumatoid factor    Standing Status:   Future    Number of Occurrences:   1    Standing Expiration Date:   12/08/2020   All questions were answered. The patient knows to call the clinic with any problems, questions or concerns. No barriers to learning was detected. The total time spent in the appointment was 30 minutes.     Truitt Merle, MD 12/09/2019   I, Joslyn Devon, am acting as scribe for Truitt Merle, MD.   I have reviewed the above documentation for accuracy and completeness, and I agree with the above.

## 2019-12-09 NOTE — Telephone Encounter (Signed)
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  Briefly discussed association with ovarian cancer, management options, and implications for family.  Scheduled an appointment to discuss results in more depth on 12/19/19 at 4:30pm.

## 2019-12-10 ENCOUNTER — Encounter: Payer: Self-pay | Admitting: *Deleted

## 2019-12-10 ENCOUNTER — Telehealth: Payer: Self-pay | Admitting: *Deleted

## 2019-12-10 LAB — ANTINUCLEAR ANTIBODIES, IFA: ANA Ab, IFA: NEGATIVE

## 2019-12-10 LAB — RHEUMATOID FACTOR: Rheumatoid fact SerPl-aCnc: <10 IU/mL (ref <14.0)

## 2019-12-10 NOTE — Telephone Encounter (Signed)
2nd tissue specimen sent for Mammaprint testing on core bx. Requisition faxed to [facility]

## 2019-12-12 ENCOUNTER — Telehealth: Payer: Self-pay

## 2019-12-12 NOTE — Telephone Encounter (Signed)
-----   Message from Truitt Merle, MD sent at 12/11/2019 11:10 AM EST ----- My nurse, please let pt know that her vitD level was normal and continue current OTC Vitd. Lab test for RA and Lups were negative. Please also ask if she has any concerns about her recent biopsy site, thanks   Truitt Merle  12/11/2019

## 2019-12-12 NOTE — Telephone Encounter (Signed)
I spoke with [place] husband.  I reveiwed Dr Ernestina Penna comments and recommendations.  He verbalized understanding.

## 2019-12-12 NOTE — Telephone Encounter (Signed)
I left vm requesting Ms Torpey return my call.

## 2019-12-19 ENCOUNTER — Other Ambulatory Visit: Payer: Self-pay | Admitting: Hematology

## 2019-12-19 ENCOUNTER — Encounter: Payer: Self-pay | Admitting: Hematology

## 2019-12-19 ENCOUNTER — Inpatient Hospital Stay: Payer: 59 | Attending: Hematology | Admitting: Genetic Counselor

## 2019-12-19 ENCOUNTER — Other Ambulatory Visit: Payer: Self-pay

## 2019-12-19 ENCOUNTER — Encounter: Payer: Self-pay | Admitting: Genetic Counselor

## 2019-12-19 DIAGNOSIS — Z79899 Other long term (current) drug therapy: Secondary | ICD-10-CM | POA: Insufficient documentation

## 2019-12-19 DIAGNOSIS — Z1589 Genetic susceptibility to other disease: Secondary | ICD-10-CM

## 2019-12-19 DIAGNOSIS — Z1379 Encounter for other screening for genetic and chromosomal anomalies: Secondary | ICD-10-CM

## 2019-12-19 DIAGNOSIS — Z803 Family history of malignant neoplasm of breast: Secondary | ICD-10-CM | POA: Insufficient documentation

## 2019-12-19 DIAGNOSIS — Z5111 Encounter for antineoplastic chemotherapy: Secondary | ICD-10-CM | POA: Insufficient documentation

## 2019-12-19 DIAGNOSIS — F32A Depression, unspecified: Secondary | ICD-10-CM | POA: Insufficient documentation

## 2019-12-19 DIAGNOSIS — K219 Gastro-esophageal reflux disease without esophagitis: Secondary | ICD-10-CM | POA: Insufficient documentation

## 2019-12-19 DIAGNOSIS — C50411 Malignant neoplasm of upper-outer quadrant of right female breast: Secondary | ICD-10-CM | POA: Diagnosis not present

## 2019-12-19 DIAGNOSIS — N951 Menopausal and female climacteric states: Secondary | ICD-10-CM | POA: Insufficient documentation

## 2019-12-19 DIAGNOSIS — Z170 Estrogen receptor positive status [ER+]: Secondary | ICD-10-CM | POA: Insufficient documentation

## 2019-12-19 DIAGNOSIS — Z79811 Long term (current) use of aromatase inhibitors: Secondary | ICD-10-CM | POA: Insufficient documentation

## 2019-12-19 DIAGNOSIS — F419 Anxiety disorder, unspecified: Secondary | ICD-10-CM | POA: Insufficient documentation

## 2019-12-19 HISTORY — DX: Genetic susceptibility to other disease: Z15.89

## 2019-12-19 NOTE — Progress Notes (Signed)
REFERRING PROVIDER: Truitt Merle MD [employer], [place] [address]  PRIMARY PROVIDER:  Rutherford Guys, MD (Inactive)  PRIMARY REASON FOR VISIT:  1. Malignant neoplasm of upper-outer quadrant of right breast in female, estrogen receptor positive ([place])   2. Monoallelic mutation of [id number] gene   3. Family history of breast cancer   4. Genetic testing    HPI:  Caitlyn Miller was previously seen in the [facility] due to a personal and family of breast cancer and concerns regarding a hereditary predisposition to cancer. Please refer to our prior cancer genetics clinic note for more information regarding Caitlyn Miller's medical, social and family histories, and our assessment and recommendations, at the time. Caitlyn Miller recent genetic test results were disclosed to her and her husband, as were recommendations warranted by these results. These results and recommendations are discussed in more detail below.  CANCER HISTORY:  Oncology History Overview Note  Cancer Staging Malignant neoplasm of upper-outer quadrant of right breast in female, estrogen receptor positive ([place]) Staging form: Breast, AJCC 8th Edition - Clinical stage from 11/12/2019: Stage IIA (cT2, cN1, cM0, G2, ER+, PR+, HER2-) - Signed by Truitt Merle, MD on 11/19/2019    Malignant neoplasm of upper-outer quadrant of right breast in female, estrogen receptor positive ([place])  11/05/2019 Mammogram   IMPRESSION: Large irregular palpable mass 4.7 x 1.4 x 1.4 cm within the upper-outer right breast 11 o'clock position, 4 cm from nipple.  There is a large area of associated coarse heterogeneous calcifications within the mass. Overall findings are concerning for breast carcinoma.   Two cortically thickened right axillary lymph nodes which are indeterminate in etiology.   11/12/2019 Cancer Staging   Staging form: Breast, AJCC 8th Edition - Clinical stage from 11/12/2019: Stage IIA (cT2, cN1, cM0, G2,  ER+, PR+, HER2-) - Signed by Truitt Merle, MD on 11/19/2019   11/12/2019 Initial Biopsy   Diagnosis 1. Breast, right, needle core biopsy, right - INVASIVE MAMMARY CARCINOMA, SEE COMMENT. - MAMMARY CARCINOMA IN SITU. 2. Lymph node, needle/core biopsy, right - METASTATIC MAMMARY CARCINOMA. Microscopic Comment 1. The carcinoma appears grade 2 and measures 16 mm in greatest linear extent. E-cadherin will be ordered. Prognostic makers will be ordered. Dr. Saralyn Pilar has reviewed the case. The case was called to [facility] on 01/13/2020.    1. E-cadherin is strongly positive consistent with a ductal phenotype.   11/12/2019 Receptors her2   1. PROGNOSTIC INDICATORS Results: IMMUNOHISTOCHEMICAL AND MORPHOMETRIC ANALYSIS PERFORMED MANUALLY The tumor cells are NEGATIVE for Her2 (0). Estrogen Receptor: 100%, POSITIVE, STRONG STAINING INTENSITY Progesterone Receptor: 100%, POSITIVE, STRONG STAINING INTENSITY Proliferation Marker Ki67: 5%   11/17/2019 Initial Diagnosis   Malignant neoplasm of upper-outer quadrant of right breast in female, estrogen receptor positive ([place])   11/25/2019 Breast MRI   IMPRESSION: 1. Large area of non-mass enhancement involving the UPPER OUTER QUADRANT of the RIGHT breast measuring approximately 4.2 x 4.1 x 2.4 cm. The biopsy-proven IDC and DCIS is present at the superomedial aspect of this NME. 2. No MRI evidence of malignancy involving the LEFT breast. 3. Intact BILATERAL retropectoral implants. 4. 2 pathologically enlarged RIGHT axillary lymph nodes. One of these nodes is biopsy-proven metastatic disease. No pathologic lymphadenopathy elsewhere   11/25/2019 PET scan   IMPRESSION: 1. Two mildly enlarged hypermetabolic right axillary lymph nodes compatible with right axillary nodal metastases. 2. No additional sites of hypermetabolic metastatic disease. 3. Asymmetric indistinct upper right breast hypermetabolism, without discrete  mass  correlate on the CT images, compatible with known primary right breast malignancy.     11/26/2019 Genetic Testing   Negative genetic testing: no pathogenic variants detected in [facility] STAT Breast Cancer Panel.  Variant of uncertain significance (VUS) detected in CHEK2 at c.1556G>T (p.Arg519Leu).  The report date is November 26, 2019.   The STAT Breast cancer panel offered by [facility] includes sequencing and rearrangement analysis for the following 9 genes:  ATM, BRCA1, BRCA2, CDH1, CHEK2, PALB2, PTEN, STK11 and TP53.    Results of [facility] Multi-Cancer Panel are pending.    12/01/2019 -  Neo-Adjuvant Anti-estrogen oral therapy   Neoadjuvant Tamoxifen 20mg  on 12/01/19. Reduced to 10mg  on 12/09/19.       ---Zoladex injection monthly starting 12/09/19.    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  Variants of uncertain significance detected RAD51D at c.715C>T (p.Arg239Trp) and CHEK2 at c.1556G>T (p.Arg519Leu).  The report date is December 08, 2019.   The Multi-Cancer Panel offered by [facility] includes sequencing and/or deletion duplication testing of the following 85 genes: AIP, ALK, APC, ATM, AXIN2,BAP1,  BARD1, BLM, BMPR1A, BRCA1, BRCA2, BRIP1, CASR, CDC73, CDH1, CDK4, CDKN1B, CDKN1C, CDKN2A (p14ARF), CDKN2A (p16INK4a), CEBPA, CHEK2, CTNNA1, DICER1, DIS3L2, EGFR (c.2369C>T, p.Thr790Met variant only), EPCAM (Deletion/duplication testing only), FH, FLCN, GATA2, GPC3, GREM1 (Promoter region deletion/duplication testing only), HOXB13 (c.251G>A, p.Gly84Glu), HRAS, KIT, MAX, MEN1, MET, MITF (c.952G>A, p.Glu318Lys variant only), MLH1, MSH2, MSH3, MSH6, MUTYH, NBN, NF1, NF2, NTHL1, PALB2, PDGFRA, PHOX2B, PMS2, POLD1, POLE, POT1, PRKAR1A, PTCH1, PTEN, RAD50, RAD51C, RAD51D, RB1, RECQL4, RET, RNF43, RUNX1, SDHAF2, SDHA (sequence changes only), SDHB, SDHC, SDHD, SMAD4, SMARCA4, SMARCB1, SMARCE1, STK11,  SUFU, TERC, TERT, TMEM127, TP53, TSC1, TSC2, VHL, WRN and WT1.      FAMILY HISTORY:  We obtained a detailed, 4-generation family history.  Significant diagnoses are listed below: Family History  Problem Relation Age of Onset  . Breast cancer Paternal Aunt 75    Ms. Brickner has two children, ages 48 and 93.  Ms. Oliphant has two brothers and one sister, all without a history of cancer.  Ms. Saez mother is 9 years old and does not have a history of cancer.  No maternal family history of cancer was reported.  Ms. Kincaid's father passed away at age 64.  Ms. Bentson's paternal aunt with diagnosed with breast cancer at age 47.  No other paternal family history of cancer.   Ms. Vore is unaware of previous family history of genetic testing for hereditary cancer risks. Patient's maternal ancestors are of [name] descent, and paternal ancestors are of [name] descent. There is no reported Ashkenazi Jewish ancestry. There is no known consanguinity.  GENETIC TESTING:  At the time of Caitlyn Miller's visit,  she elected to pursue genetic testing for hereditary cancer genes. 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 There were no deleterious mutations in  AIP, ALK, APC, ATM, AXIN2, BAP1,  BARD1, BLM, BMPR1A, BRCA1, BRCA2, BRIP1, CASR, CDC73, CDH1, CDK4, CDKN1B, CDKN1C, CDKN2A (p14ARF), CDKN2A (p16INK4a), CEBPA, CHEK2, CTNNA1, DICER1, DIS3L2, EGFR, EPCAM (Deletion/duplication testing only), FH, FLCN, GATA2, GPC3, GREM1 (Promoter region deletion/duplication testing only), HOXB13 (c.251G>A, p.Gly84Glu), HRAS, KIT, MAX, MEN1, MET, MITF (c.952G>A, p.Glu318Lys variant only), MLH1, MSH2, MSH3, MSH6, MUTYH, NBN, NF1, NF2, NTHL1, PALB2, PDGFRA, PHOX2B, PMS2, POLD1, POLE, POT1, PRKAR1A, PTCH1, PTEN, RAD50, RAD51C,  RB1, RECQL4, RET, RNF43,  RUNX1, SDHAF2, SDHA (sequence changes only), SDHB, SDHC, SDHD, SMAD4, SMARCA4, SMARCB1, SMARCE1, STK11, SUFU, TERC, TERT, TMEM127, TP53, TSC1, TSC2, VHL, WRN and WT1.   The test report has been scanned into EPIC and is located under the Molecular Pathology section of the Results Review tab.  A portion of the result report is included below for reference.    Genetic testing did identify two variants of uncertain significance (VUS) - one in the RAD51D gene called c.715C>T (p.Arg239Trp) and a second in the CHEK2 gene called c.1556G>T (p.Arg519Leu).  At this time, it is unknown if these variants are associated with increased cancer risk or if they are normal findings, but most variants such as these get reclassified to being inconsequential. They should not be used to make medical management decisions. With time, we suspect the lab will determine the significance of these variants, if any. If we do learn more about them, we will try to contact Caitlyn Miller to discuss it further. However, it is important to stay in touch with [place] periodically and keep the address and phone number up to date.  CLINICAL CONDITION Women who are carriers of a single pathogenic RAD51D variant have an increased risk of ovarian cancer. Studies suggest this risk is 7-14% (PMID: 39767341, 93790240, 97353299, 24268341, 96222979, 89211941, 74081448, 18563149).  There is also preliminary evidence of an association between RAD51D and triple negative breast cancer (PMID: 70263785, 88502774, 12878676) and prostate cancer (PMID: 72094709, 62836629). At this time, there is not enough evidence to change medical management for breast cancer screening. This uncertainty may be resolved as new information becomes available. Individuals with a pathogenic variant in RAD51D will not necessarily develop cancer in their lifetime, however, their risk of cancer is increased over that of the general population.  GENE INFORMATION: The RAD51D gene is  essential to DNA repair. It is involved in the homologous recombination repair pathway of double-stranded DNA breaks arising during DNA replication or induced by DNA-damaging agents (UniProtKB - U76546 (RA51D_HUMAN). Accessed December 2021). If there is a pathogenic variant in this gene that prevents it from functioning normally, the risk of developing certain types of cancers may be increased.  MANAGEMENT: The [employer]  (NCCN) recommends consideration of prophylactic salpingo-oophorectomy (surgical removal of the ovaries and fallopian tubes) for women with a pathogenic variant in RAD51D after childbearing is complete. The current evidence is insufficient to make a firm recommendation as to the optimal age for this procedure. However, based on the current, limited evidence, a discussion about surgery should be held around 57-27 years of age or earlier based on a specific family history of early-onset ovarian cancer (NCCN Genetic/Familial High-Risk Assessment: Breast and Ovarian. Version 1.2022). Women electing to defer prophylactic oophorectomy can consider screening for serum CA-125 and transvaginal ultrasound; however, data do not support such screening and it should not be a substitute for preventive surgery [employer]. Genetic/Familial High-Risk Assessment: Breast and Ovarian. Version 1.2022).  The current NCCN guidelines do not recommend additional breast cancer screening for individuals with a single pathogenic RAD51D variant beyond what is recommended for the general population. However, they caution that cancer screening should ultimately be guided by personal and family history (NCCN. Genetic/Familial High-Risk Assessment: Breast and Ovarian. Version 1.2022).  An individual's cancer risk and medical management are not determined by genetic test results alone. Overall cancer risk assessment incorporates additional factors, including personal  medical history, family history, and any available genetic information that may  result in a personalized plan for cancer prevention and surveillance.  INHERITANCE: Hereditary predisposition to cancer due to a single pathogenic variant in the RAD51D gene has autosomal dominant inheritance. This means that an individual with a pathogenic variant has a 50% chance of passing the condition on to their offspring. With this result, it is now possible to identify at-risk relatives who can pursue testing for this specific familial variant. Many cases are inherited from a parent, but some cases can occur spontaneously (i.e., an individual with a pathogenic variant has parents who do not have it).  FAMILY MEMBERS; Even though data regarding pathogenic variants in RAD51D is still emerging, knowing if such a variant is present is advantageous. At-risk relatives can be identified, enabling pursuit of a diagnostic evaluation. Further, the available information regarding hereditary cancer susceptibility genes is constantly evolving and more clinically relevant data regarding RAD51D are likely to become available in the near future. Awareness of this cancer predisposition encourages patients and their providers to inform at-risk family members, to diligently follow recommended screening protocols, and to be vigilant in maintaining close and regular contact with their local genetics clinic in anticipation of new information.  It is important that all of Caitlyn Miller's relatives (both men and women) know of the presence of this gene mutation. Site-specific genetic testing can sort out who in the family is at risk and who is not.    Caitlyn Miller's children are have a 50% chance to have inherited this mutation. However, they are relatively young and this will not be of any consequence to them for several years. We do not test children because there is no risk to them until they are adults. We recommend they have genetic  counseling and testing by the time they are in their early 20s.    Caitlyn Miller siblings also have a 50% chance to have inherited this mutation. We recommend they have genetic testing for this same mutation, as identifying the presence of this mutation would allow them to also take advantage of risk-reducing measures.  Caitlyn Miller siblings, mother, and aunts/uncles live in [place].  She expressed willingness to notify her extended family of this result so that they can speak to their doctors about to coordinating testing.    SUPPORT AND RESOURCES:  If Caitlyn Miller is interested in information and support, there are two groups, Facing Our Risk (www.facingourrisk.com) and Bright Pink (www.brightpink.org) which some people have found useful when identified with a hereditary risk for breast and ovarian cancer. They provide opportunities to speak with other individuals from high-risk families. To locate genetic counselors in other cities, visit the website of the [employer] (ArtistMovie.se) and Secretary/administrator for a Social worker by zip code.  We encouraged Caitlyn Miller to remain in contact with [place] on an annual basis so we can update her personal and family histories, and let her know of advances in cancer genetics that may benefit the family. Our contact number was provided. Caitlyn Miller questions were answered to her satisfaction today, and she knows she is welcome to call anytime with additional questions.   Diara Chaudhari M. Joette Catching, [place], [facility] Certified Film/video [email or web address] (P) [phone]  The patient was seen for a total of 25 minutes in face-to-face genetic counseling.

## 2019-12-22 ENCOUNTER — Inpatient Hospital Stay: Payer: 59

## 2019-12-22 ENCOUNTER — Inpatient Hospital Stay (HOSPITAL_BASED_OUTPATIENT_CLINIC_OR_DEPARTMENT_OTHER): Payer: 59 | Admitting: Hematology

## 2019-12-22 ENCOUNTER — Other Ambulatory Visit: Payer: Self-pay

## 2019-12-22 VITALS — BP 112/59 | HR 70 | Temp 97.6°F | Resp 13 | Ht 62.0 in | Wt 135.6 lb

## 2019-12-22 DIAGNOSIS — Z170 Estrogen receptor positive status [ER+]: Secondary | ICD-10-CM | POA: Diagnosis not present

## 2019-12-22 DIAGNOSIS — Z79811 Long term (current) use of aromatase inhibitors: Secondary | ICD-10-CM | POA: Diagnosis not present

## 2019-12-22 DIAGNOSIS — K219 Gastro-esophageal reflux disease without esophagitis: Secondary | ICD-10-CM | POA: Diagnosis not present

## 2019-12-22 DIAGNOSIS — Z5111 Encounter for antineoplastic chemotherapy: Secondary | ICD-10-CM | POA: Diagnosis not present

## 2019-12-22 DIAGNOSIS — F419 Anxiety disorder, unspecified: Secondary | ICD-10-CM | POA: Diagnosis not present

## 2019-12-22 DIAGNOSIS — Z79899 Other long term (current) drug therapy: Secondary | ICD-10-CM | POA: Diagnosis not present

## 2019-12-22 DIAGNOSIS — C50411 Malignant neoplasm of upper-outer quadrant of right female breast: Secondary | ICD-10-CM

## 2019-12-22 DIAGNOSIS — F32A Depression, unspecified: Secondary | ICD-10-CM | POA: Diagnosis not present

## 2019-12-22 DIAGNOSIS — N951 Menopausal and female climacteric states: Secondary | ICD-10-CM | POA: Diagnosis not present

## 2019-12-22 DIAGNOSIS — Z803 Family history of malignant neoplasm of breast: Secondary | ICD-10-CM | POA: Diagnosis not present

## 2019-12-22 MED ORDER — CLINDAMYCIN PHOSPHATE 1 % EX GEL: Freq: Two times a day (BID) | CUTANEOUS | 0 refills | Status: DC

## 2019-12-22 MED ORDER — GOSERELIN ACETATE 3.6 MG ~~LOC~~ IMPL: 3.6000 mg | DRUG_IMPLANT | Freq: Once | SUBCUTANEOUS | Status: DC

## 2019-12-22 MED ORDER — GABAPENTIN 100 MG PO CAPS: 100.0000 mg | ORAL_CAPSULE | Freq: Every day | ORAL | 1 refills | Status: DC

## 2019-12-22 NOTE — Progress Notes (Signed)
Caitlyn Miller   [phone] [phone]   Clinic Follow up Note   Patient Care Team: Caitlyn Guys, MD (Inactive) as PCP - General (Family Medicine) Mansouraty, Telford Nab., MD as Consulting Physician (Gastroenterology) Caitlyn Kaufmann, RN as Oncology Nurse Navigator Caitlyn Germany, RN as Oncology Nurse Navigator Caitlyn Mesa, MD as Consulting Physician (General Surgery) Caitlyn Merle, MD as Consulting Physician (Hematology) Caitlyn Rudd, MD as Consulting Physician (Radiation Oncology) Contogiannis, Audrea Muscat, MD as Consulting Physician (Plastic Surgery)  Date of Service:  12/22/2019  CHIEF COMPLAINT: F/u of right breast cancer   SUMMARY OF ONCOLOGIC HISTORY: Oncology History Overview Note  Cancer Staging Malignant neoplasm of upper-outer quadrant of right breast in female, estrogen receptor positive ([place]) Staging form: Breast, AJCC 8th Edition - Clinical stage from 11/12/2019: Stage IIA (cT2, cN1, cM0, G2, ER+, PR+, HER2-) - Signed by Caitlyn Merle, MD on 11/19/2019    Malignant neoplasm of upper-outer quadrant of right breast in female, estrogen receptor positive ([place])  11/05/2019 Mammogram   IMPRESSION: Large irregular palpable mass 4.7 x 1.4 x 1.4 cm within the upper-outer right breast 11 o'clock position, 4 cm from nipple.  There is a large area of associated coarse heterogeneous calcifications within the mass. Overall findings are concerning for breast carcinoma.   Two cortically thickened right axillary lymph nodes which are indeterminate in etiology.   11/12/2019 Cancer Staging   Staging form: Breast, AJCC 8th Edition - Clinical stage from 11/12/2019: Stage IIA (cT2, cN1, cM0, G2, ER+, PR+, HER2-) - Signed by Caitlyn Merle, MD on 11/19/2019   11/12/2019 Initial Biopsy   Diagnosis 1. Breast, right, needle core biopsy, right - INVASIVE MAMMARY CARCINOMA, SEE COMMENT. - MAMMARY CARCINOMA IN SITU. 2. Lymph node, needle/core biopsy, right -  METASTATIC MAMMARY CARCINOMA. Microscopic Comment 1. The carcinoma appears grade 2 and measures 16 mm in greatest linear extent. E-cadherin will be ordered. Prognostic makers will be ordered. Dr. Saralyn Miller has reviewed the case. The case was called to Caitlyn Miller on 01/13/2020.    1. E-cadherin is strongly positive consistent with a ductal phenotype.   11/12/2019 Receptors her2   1. PROGNOSTIC INDICATORS Results: IMMUNOHISTOCHEMICAL AND MORPHOMETRIC ANALYSIS PERFORMED MANUALLY The tumor cells are NEGATIVE for Her2 (0). Estrogen Receptor: 100%, POSITIVE, STRONG STAINING INTENSITY Progesterone Receptor: 100%, POSITIVE, STRONG STAINING INTENSITY Proliferation Marker Ki67: 5%   11/17/2019 Initial Diagnosis   Malignant neoplasm of upper-outer quadrant of right breast in female, estrogen receptor positive ([place])   11/25/2019 Breast MRI   IMPRESSION: 1. Large area of non-mass enhancement involving the UPPER OUTER QUADRANT of the RIGHT breast measuring approximately 4.2 x 4.1 x 2.4 cm. The biopsy-proven IDC and DCIS is present at the superomedial aspect of this NME. 2. No MRI evidence of malignancy involving the LEFT breast. 3. Intact BILATERAL retropectoral implants. 4. 2 pathologically enlarged RIGHT axillary lymph nodes. One of these nodes is biopsy-proven metastatic disease. No pathologic lymphadenopathy elsewhere   11/25/2019 PET scan   IMPRESSION: 1. Two mildly enlarged hypermetabolic right axillary lymph nodes compatible with right axillary nodal metastases. 2. No additional sites of hypermetabolic metastatic disease. 3. Asymmetric indistinct upper right breast hypermetabolism, without discrete mass correlate on the CT images, compatible with known primary right breast malignancy.     11/26/2019 Genetic Testing   Negative genetic testing: no pathogenic variants detected in [facility] STAT Breast Cancer Panel.  Variant of uncertain significance (VUS) detected in  CHEK2 at c.1556G>T (p.Arg519Leu).  The report  date is November 26, 2019.   The STAT Breast cancer panel offered by [facility] includes sequencing and rearrangement analysis for the following 9 genes:  ATM, BRCA1, BRCA2, CDH1, CHEK2, PALB2, PTEN, STK11 and TP53.    Results of [facility] Multi-Cancer Panel are pending.    12/01/2019 -  Neo-Adjuvant Anti-estrogen oral therapy   Neoadjuvant Tamoxifen 26m on 12/01/19. Reduced to 172mon 12/09/19.       ---Zoladex injection monthly starting 12/09/19.    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  Variants of uncertain significance detected RAD51D at c.715C>T (p.Arg239Trp) and CHEK2 at c.1556G>T (p.Arg519Leu).  The report date is December 08, 2019.   The Multi-Cancer Panel offered by [facility] includes sequencing and/or deletion duplication testing of the following 85 genes: AIP, ALK, APC, ATM, AXIN2,BAP1,  BARD1, BLM, BMPR1A, BRCA1, BRCA2, BRIP1, CASR, CDC73, CDH1, CDK4, CDKN1B, CDKN1C, CDKN2A (p14ARF), CDKN2A (p16INK4a), CEBPA, CHEK2, CTNNA1, DICER1, DIS3L2, EGFR (c.2369C>T, p.Thr790Met variant only), EPCAM (Deletion/duplication testing only), FH, FLCN, GATA2, GPC3, GREM1 (Promoter region deletion/duplication testing only), HOXB13 (c.251G>A, p.Gly84Glu), HRAS, KIT, MAX, MEN1, MET, MITF (c.952G>A, p.Glu318Lys variant only), MLH1, MSH2, MSH3, MSH6, MUTYH, NBN, NF1, NF2, NTHL1, PALB2, PDGFRA, PHOX2B, PMS2, POLD1, POLE, POT1, PRKAR1A, PTCH1, PTEN, RAD50, RAD51C, RAD51D, RB1, RECQL4, RET, RNF43, RUNX1, SDHAF2, SDHA (sequence changes only), SDHB, SDHC, SDHD, SMAD4, SMARCA4, SMARCB1, SMARCE1, STK11, SUFU, TERC, TERT, TMEM127, TP53, TSC1, TSC2, VHL, WRN and WT1.       CURRENT THERAPY:  Neoadjuvant Tamoxifen 2011maily started on 12/01/19. Reduced to 46m62m 12/09/19 due to poor tolerance   Zoladex injection monthly starting 12/09/19.   INTERVAL HISTORY:   Caitlyn Miller for a follow up. She presents to the clinic with her husband. She has been tolerating low-dose tamoxifen better, no significant nausea or vomiting.  She is eating better.  She received first dose Zoladex injection 2 weeks ago, and her hot flash has been quite severe.  She wakes up frequently at night, she is on Effexor.  No significant mood swing, joint pain, or other new concerns.  She feels her breast mass has not changed much lately.  All other systems were reviewed with the patient and are negative.  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  ALLERGIES:  is allergic to ibuprofen.  MEDICATIONS:  Current Outpatient Medications  Medication Sig Dispense Refill   clindamycin (CLINDAGEL) 1 % gel Apply topically 2 (two)  times daily. 30 g 0   escitalopram (LEXAPRO) 10 MG tablet Take 1 tablet (10 mg total) by mouth at bedtime. 90 tablet 1   gabapentin (NEURONTIN) 100 MG capsule Take 1 capsule (100 mg total) by mouth at bedtime. 60 capsule 1   hydrOXYzine (ATARAX/VISTARIL) 25 MG tablet Take 1 tablet (25 mg  total) by mouth at bedtime as needed for anxiety. 90 tablet 0   ondansetron (ZOFRAN ODT) 4 MG disintegrating tablet Take 1 tablet (4 mg total) by mouth every 8 (eight) hours as needed for nausea or vomiting. 30 tablet 0   pantoprazole (PROTONIX) 40 MG tablet Take 1 tablet (40 mg total) by mouth daily as needed. 30 tablet 5   tamoxifen (NOLVADEX) 10 MG tablet Take 1 tablet (10 mg total) by mouth 2 (two) times daily. 30 tablet 0   venlafaxine (EFFEXOR) 37.5 MG tablet Take 1 tablet (37.5 mg total) by mouth 2 (two) times daily. 30 tablet 0   No current facility-administered medications for this visit.    PHYSICAL EXAMINATION: ECOG PERFORMANCE STATUS: 1 - Symptomatic but completely ambulatory  Vitals:   12/22/19 1611  BP: (!) 112/59  Pulse: 70  Resp: 13  Temp: 97.6 F (36.4 C)  SpO2: 100%   Filed Weights   12/22/19 1611  Weight: 135 lb 9.6 oz (61.5 kg)    GENERAL:alert, no distress and comfortable SKIN: skin color, texture, turgor are normal, no rashes or significant lesions EYES: normal, Conjunctiva are pink and non-injected, sclera clear Musculoskeletal:no cyanosis of digits and no clubbing  NEURO: alert & oriented x 3 with fluent speech, no focal motor/sensory deficits  LABORATORY DATA:  I have reviewed the data as listed CBC Latest Ref Rng & Units 11/19/2019 05/01/2017 04/12/2017  WBC 4.0 - 10.5 K/uL 6.4 6.4 7.8  Hemoglobin 12.0 - 15.0 g/dL 12.0 12.8 12.3  Hematocrit 36 - 46 % 36.3 39.5 35.8  Platelets 150 - 400 K/uL 281 273 261     CMP Latest Ref Rng & Units 11/19/2019 05/01/2017 04/12/2017  Glucose 70 - 99 mg/dL 72 82 -  BUN 6 - 20 mg/dL 12 7 -  Creatinine 0.44 - 1.00 mg/dL 0.72 0.62 -  Sodium 135 - 145 mmol/L 140 142 -  Potassium 3.5 - 5.1 mmol/L 4.3 4.5 -  Chloride 98 - 111 mmol/L 107 104 -  CO2 22 - 32 mmol/L 27 23 -  Calcium 8.9 - 10.3 mg/dL 9.2 9.8 9.8  Total Protein 6.5 - 8.1 g/dL 7.4 7.4 -  Total Bilirubin 0.3 - 1.2 mg/dL 0.5 0.4 -  Alkaline Phos 38 - 126  U/L 42 40 -  AST 15 - 41 U/L 24 20 -  ALT 0 - 44 U/L 33 23 -      RADIOGRAPHIC STUDIES: I have personally reviewed the radiological images as listed and agreed with the findings in the report. No results found.   ASSESSMENT & PLAN:  Caitlyn Miller is a 34 y.o. female with   1.Malignant neoplasm of upper-outer quadrant of right breast, StageIIA, c(T2N1M0), ER+/PR+/HER2-, Levester Fresh, Mammaprint luminal type A, low risk -She has a 4.7cm right breast mass at 11:00 position andtwo abnormalright axillary LN. Her 11/12/19 biopsy shows grade II invasive ductal carcinomawithmetastasisto her LN, ER/PR strongly positive, HER2 negative, low Ki67.  -Her 11/2019 breast MRI and PET scan did not indicate left breast or distant malignancy or metastasis.  -due to her large tumor, she needs mastectomy if she proceeded with surgery first.  She has been seen  by Dr. Georgette Dover. Her mastectomy will be complicated by history of implant placement. -I discussed her MammaPrint results, which showed luminal type A, low risk disease, this is not surprised due to her strongly ER and PR positivity, negative HER-2 and low Ki-67.  We discussed that she is unlikely benefit from chemotherapy, and neoadjuvant chemotherapy is not recommended.  -I have started her on neoadjuvant tamoxifen on 12/01/19. She tolerated poorly with nausea, and diffuse body aches. Dose reduced to 89m from 12/09/19. She is tolerating much better now.  -I started her on monthly Zoladex injection on 12/09/19, she has severe hot flushes, will add on gabapentin -Given her strong desire to have lumpectomy, instead of mastectomy, I recommend her to continue neoadjuvant endocrine therapy.  Plan to switch tamoxifen to anastrozole in 2 weeks when her ovarian function is fully suppressed. She is agreeable.  We discussed neoadjuvant endocrine therapy does not shrink her tumor quickly, it may take 5 to 6 months to see significant shrinkage.  Plan to repeat mammogram  and ultrasound in 2-3 months for monitoring.  If she has clinical concern of disease progression, will definitely procedure with surgery.  -I also discussed the role of CDK4/6 inhibitors, such as Verzenio.  It has showed clinical benefit in adjuvant setting in high risk early stage breast cancer.  The data of CDK4/6 inhibitor in neoadjuvant setting is still very limited.  If I can get her insurance approval, I will let her try Verzenio if she tolerates AI well. -Follow-up in injection in 2 weeks, then switch tamoxifen to anastrozole.   2.GeneralizedWeakness, body pain, Fatigue  -Pt notes for the past 3 years she has had mid chest and mid back pain. Previously intermittent, now constant. She also notes b/l arm and leg weakness and pain along with tingling of her toes and fingers.  -This has lead to fatigue, low appetite, decreased daily function/activity.  -[place]. They said this is related to stress, anxiety or depression. Pt feels she has an illness causing this instead.  -Her 11/25/19 PET was negative for any distant malignancy or metastasis.   3. Anxiety, depression  -Pt notes feeling more anxious (due to health before breast cancer), than depressed.  -She is on Lexapro, which helps some. Given drowsiness she takes half tablet. Will wean her off with half tablet every other day for 1 week. I previously called in Effexor.   4. Genetic Testing negative for pathogenetic mutations in RAD51D, and Variant of uncertain significance (VUS) detected inCHEK2at c.1556G>T (p.Arg519Leu).  -She is scheduled to meet genetic counselor Cari again later this week -Plan to discuss more on her next visit  5. Social Support  [place]. [place] has 2 teenage children. [place]  6. Scalp Lesion -She notes recent bleeding and pain of  her [place] of scalp.  -She has a pending dermatology appointment   PLAN: -MammaPrint results reviewed, low risk luminal type A, I do not recommend neoadjuvant chemotherapy  -continue tamoxifen 10 mg daily -I called in gabapentin 100 mg, she will start at 1 capsule, then gradually increase to 2 to 3 capsules in the next 2-4 weeks for her hot flashes. -Continue Effexor 37.5 mg daily, may increase dose as needed in future -Lab and Zoladex injection in 2 weeks, plan to change tamoxifen to anastrozole on next visit -I will  call in [place] to [employer] to see if we can get her insurance approval. Will start at low dose and titrate up if she can tolerate.     No problem-specific Assessment & Plan notes found for this encounter.   No orders of the defined types were placed in this encounter.  All questions were answered. The patient knows to call the clinic with any problems, questions or concerns. No barriers to learning was detected. The total time spent in the appointment was 30 minutes.     Caitlyn Merle, MD 12/22/2019   I, Joslyn Devon, am acting as scribe for Caitlyn Merle, MD.   I have reviewed the above documentation for accuracy and completeness, and I agree with the above.

## 2019-12-22 NOTE — Progress Notes (Incomplete)
[place]   [phone] [phone]   Clinic Follow up Note   Patient Care Team: Rutherford Guys, MD (Inactive) as PCP - General (Family Medicine) Mansouraty, Telford Nab., MD as Consulting Physician (Gastroenterology) Mauro Kaufmann, RN as Oncology Nurse Navigator Rockwell Germany, RN as Oncology Nurse Navigator Donnie Mesa, MD as Consulting Physician (General Surgery) Truitt Merle, MD as Consulting Physician (Hematology) Kyung Rudd, MD as Consulting Physician (Radiation Oncology) Contogiannis, Audrea Muscat, MD as Consulting Physician (Plastic Surgery)  Date of Service:  12/22/2019  CHIEF COMPLAINT:  F/u of right breast cancer   SUMMARY OF ONCOLOGIC HISTORY: Oncology History Overview Note  Cancer Staging Malignant neoplasm of upper-outer quadrant of right breast in female, estrogen receptor positive ([place]) Staging form: Breast, AJCC 8th Edition - Clinical stage from 11/12/2019: Stage IIA (cT2, cN1, cM0, G2, ER+, PR+, HER2-) - Signed by Truitt Merle, MD on 11/19/2019    Malignant neoplasm of upper-outer quadrant of right breast in female, estrogen receptor positive ([place])  11/05/2019 Mammogram   IMPRESSION: Large irregular palpable mass 4.7 x 1.4 x 1.4 cm within the upper-outer right breast 11 o'clock position, 4 cm from nipple.  There is a large area of associated coarse heterogeneous calcifications within the mass. Overall findings are concerning for breast carcinoma.   Two cortically thickened right axillary lymph nodes which are indeterminate in etiology.   11/12/2019 Cancer Staging   Staging form: Breast, AJCC 8th Edition - Clinical stage from 11/12/2019: Stage IIA (cT2, cN1, cM0, G2, ER+, PR+, HER2-) - Signed by Truitt Merle, MD on 11/19/2019   11/12/2019 Initial Biopsy   Diagnosis 1. Breast, right, needle core biopsy, right - INVASIVE MAMMARY CARCINOMA, SEE COMMENT. - MAMMARY CARCINOMA IN SITU. 2. Lymph node, needle/core biopsy, right -  METASTATIC MAMMARY CARCINOMA. Microscopic Comment 1. The carcinoma appears grade 2 and measures 16 mm in greatest linear extent. E-cadherin will be ordered. Prognostic makers will be ordered. Dr. Saralyn Pilar has reviewed the case. The case was called to [facility] on 01/13/2020.    1. E-cadherin is strongly positive consistent with a ductal phenotype.   11/12/2019 Receptors her2   1. PROGNOSTIC INDICATORS Results: IMMUNOHISTOCHEMICAL AND MORPHOMETRIC ANALYSIS PERFORMED MANUALLY The tumor cells are NEGATIVE for Her2 (0). Estrogen Receptor: 100%, POSITIVE, STRONG STAINING INTENSITY Progesterone Receptor: 100%, POSITIVE, STRONG STAINING INTENSITY Proliferation Marker Ki67: 5%   11/17/2019 Initial Diagnosis   Malignant neoplasm of upper-outer quadrant of right breast in female, estrogen receptor positive ([place])   11/25/2019 Breast MRI   IMPRESSION: 1. Large area of non-mass enhancement involving the UPPER OUTER QUADRANT of the RIGHT breast measuring approximately 4.2 x 4.1 x 2.4 cm. The biopsy-proven IDC and DCIS is present at the superomedial aspect of this NME. 2. No MRI evidence of malignancy involving the LEFT breast. 3. Intact BILATERAL retropectoral implants. 4. 2 pathologically enlarged RIGHT axillary lymph nodes. One of these nodes is biopsy-proven metastatic disease. No pathologic lymphadenopathy elsewhere   11/25/2019 PET scan   IMPRESSION: 1. Two mildly enlarged hypermetabolic right axillary lymph nodes compatible with right axillary nodal metastases. 2. No additional sites of hypermetabolic metastatic disease. 3. Asymmetric indistinct upper right breast hypermetabolism, without discrete mass correlate on the CT images, compatible with known primary right breast malignancy.     11/26/2019 Genetic Testing   Negative genetic testing: no pathogenic variants detected in [facility] STAT Breast Cancer Panel.  Variant of uncertain significance (VUS) detected in  CHEK2 at c.1556G>T (p.Arg519Leu).  The  report date is November 26, 2019.   The STAT Breast cancer panel offered by [facility] includes sequencing and rearrangement analysis for the following 9 genes:  ATM, BRCA1, BRCA2, CDH1, CHEK2, PALB2, PTEN, STK11 and TP53.    Results of [facility] Multi-Cancer Panel are pending.    12/01/2019 -  Neo-Adjuvant Anti-estrogen oral therapy   Neoadjuvant Tamoxifen 22m on 12/01/19. Reduced to 121mon 12/09/19.       ---Zoladex injection monthly starting 12/09/19.    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  Variants of uncertain significance detected RAD51D at c.715C>T (p.Arg239Trp) and CHEK2 at c.1556G>T (p.Arg519Leu).  The report date is December 08, 2019.   The Multi-Cancer Panel offered by [facility] includes sequencing and/or deletion duplication testing of the following 85 genes: AIP, ALK, APC, ATM, AXIN2,BAP1,  BARD1, BLM, BMPR1A, BRCA1, BRCA2, BRIP1, CASR, CDC73, CDH1, CDK4, CDKN1B, CDKN1C, CDKN2A (p14ARF), CDKN2A (p16INK4a), CEBPA, CHEK2, CTNNA1, DICER1, DIS3L2, EGFR (c.2369C>T, p.Thr790Met variant only), EPCAM (Deletion/duplication testing only), FH, FLCN, GATA2, GPC3, GREM1 (Promoter region deletion/duplication testing only), HOXB13 (c.251G>A, p.Gly84Glu), HRAS, KIT, MAX, MEN1, MET, MITF (c.952G>A, p.Glu318Lys variant only), MLH1, MSH2, MSH3, MSH6, MUTYH, NBN, NF1, NF2, NTHL1, PALB2, PDGFRA, PHOX2B, PMS2, POLD1, POLE, POT1, PRKAR1A, PTCH1, PTEN, RAD50, RAD51C, RAD51D, RB1, RECQL4, RET, RNF43, RUNX1, SDHAF2, SDHA (sequence changes only), SDHB, SDHC, SDHD, SMAD4, SMARCA4, SMARCB1, SMARCE1, STK11, SUFU, TERC, TERT, TMEM127, TP53, TSC1, TSC2, VHL, WRN and WT1.       CURRENT THERAPY:  Neoadjuvant Tamoxifen 2065maily started on 12/01/19. Reduced to 42m67m 12/09/19 due to poor tolerance   Zoladex injection monthly starting 12/09/19.   INTERVAL HISTORY:  *** Caitlyn Lucienhere for a follow up. She presents to the clinic alone.    REVIEW OF SYSTEMS:  *** Constitutional: Denies fevers, chills or abnormal weight loss Eyes: Denies blurriness of vision Ears, nose, mouth, throat, and face: Denies mucositis or sore throat Respiratory: Denies cough, dyspnea or wheezes Cardiovascular: Denies palpitation, chest discomfort or lower extremity swelling Gastrointestinal:  Denies nausea, heartburn or change in bowel habits Skin: Denies abnormal skin rashes Lymphatics: Denies new lymphadenopathy or easy bruising Neurological:Denies numbness, tingling or new weaknesses Behavioral/Psych: Mood is stable, no new changes  All other systems were reviewed with the patient and are negative.  MEDICAL HISTORY:  Past Medical History:  Diagnosis Date  . Abnormal Pap smear   . Acid reflux   . Anemia   . Anxiety   . Breast cancer (HCC)[place]. Decreased appetite 10/08  . Depression   . Endometriosis 10/2004  . Epigastric pain 10/2006  . Family history of breast cancer 11/19/2019  . FH: migraines   . GBS carrier   . H/O amenorrhea 06/2006  . H/O dyspareunia 7/07  . H/O fatigue   . H/O nausea and vomiting 10/2006  . H/O rubella   . H/O varicella   . Hyperemesis arising during pregnancy    First pregnancy  . Irregular periods/menstrual cycles 02/2005  . 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 Pelvic pain 09/2008    SURGICAL HISTORY: Past Surgical History:  Procedure Laterality Date  . AUGMENTATION MAMMAPLASTY Bilateral [id number]   . WISDOM TOOTH EXTRACTION      I have reviewed the social history and family history with the patient and they are unchanged from previous note.  ALLERGIES:  is allergic to ibuprofen.  MEDICATIONS:  Current Outpatient Medications  Medication Sig  Dispense Refill  . escitalopram (LEXAPRO) 10 MG tablet Take 1 tablet (10 mg total) by mouth at bedtime. 90 tablet 1  . hydrOXYzine (ATARAX/VISTARIL) 25 MG  tablet Take 1 tablet (25 mg total) by mouth at bedtime as needed for anxiety. 90 tablet 0  . ondansetron (ZOFRAN ODT) 4 MG disintegrating tablet Take 1 tablet (4 mg total) by mouth every 8 (eight) hours as needed for nausea or vomiting. 30 tablet 0  . pantoprazole (PROTONIX) 40 MG tablet Take 1 tablet (40 mg total) by mouth daily as needed. 30 tablet 5  . tamoxifen (NOLVADEX) 10 MG tablet Take 1 tablet (10 mg total) by mouth 2 (two) times daily. 30 tablet 0  . venlafaxine (EFFEXOR) 37.5 MG tablet Take 1 tablet (37.5 mg total) by mouth 2 (two) times daily. 30 tablet 0   No current facility-administered medications for this visit.    PHYSICAL EXAMINATION: ECOG PERFORMANCE STATUS: {CHL ONC ECOG PS:[phone]}  There were no vitals filed for this visit. There were no vitals filed for this visit. *** GENERAL:alert, no distress and comfortable SKIN: skin color, texture, turgor are normal, no rashes or significant lesions EYES: normal, Conjunctiva are pink and non-injected, sclera clear {OROPHARYNX:no exudate, no erythema and lips, buccal mucosa, and tongue normal}  NECK: supple, thyroid normal size, non-tender, without nodularity LYMPH:  no palpable lymphadenopathy in the cervical, axillary {or inguinal} LUNGS: clear to auscultation and percussion with normal breathing effort HEART: regular rate & rhythm and no murmurs and no lower extremity edema ABDOMEN:abdomen soft, non-tender and normal bowel sounds Musculoskeletal:no cyanosis of digits and no clubbing  NEURO: alert & oriented x 3 with fluent speech, no focal motor/sensory deficits  LABORATORY DATA:  I have reviewed the data as listed CBC Latest Ref Rng & Units 11/19/2019 05/01/2017 04/12/2017  WBC 4.0 - 10.5 K/uL 6.4 6.4 7.8  Hemoglobin 12.0 - 15.0 g/dL 12.0 12.8 12.3  Hematocrit 36 - 46 % 36.3 39.5 35.8  Platelets 150 - 400 K/uL 281 273 261     CMP Latest Ref Rng & Units 11/19/2019 05/01/2017 04/12/2017  Glucose 70 - 99 mg/dL 72 82 -   BUN 6 - 20 mg/dL 12 7 -  Creatinine 0.44 - 1.00 mg/dL 0.72 0.62 -  Sodium 135 - 145 mmol/L 140 142 -  Potassium 3.5 - 5.1 mmol/L 4.3 4.5 -  Chloride 98 - 111 mmol/L 107 104 -  CO2 22 - 32 mmol/L 27 23 -  Calcium 8.9 - 10.3 mg/dL 9.2 9.8 9.8  Total Protein 6.5 - 8.1 g/dL 7.4 7.4 -  Total Bilirubin 0.3 - 1.2 mg/dL 0.5 0.4 -  Alkaline Phos 38 - 126 U/L 42 40 -  AST 15 - 41 U/L 24 20 -  ALT 0 - 44 U/L 33 23 -      RADIOGRAPHIC STUDIES: I have personally reviewed the radiological images as listed and agreed with the findings in the report. No results found.   ASSESSMENT & PLAN:  Caitlyn Miller is a 35 y.o. female with    1.Malignant neoplasm of upper-outer quadrant of right breast, StageIIA, c(T2N1M0), ER+/PR+/HER2-, [place] -She has a 4.7cm right breast mass at 11:00 position andtwo abnormalright axillary LN. Her 11/12/19 biopsy shows grade II invasive ductal carcinomawithmetastasisto her LN, ER/PR strongly positive, HER2 negative, low Ki67.  -I discussed with low Ki 67and strongly ER/PR positive disease,her cancer islikely low risk disease, butclinically her presented withrapid growth of her tumor, appears aggressiveclinically.  -Her 11/2019 breast MRI and PET  scan did not indicate left breast or distant malignancy or metastasis.  -due to her large tumor, she needs mastectomy if she proceeded with surgery first.  She has been seen by Dr. Georgette Dover. Her mastectomy will be complicated by history of implant placement. -Due to insufficient biopsy tissue, she underwent second right breast biopsy today (12/09/19) to get more tissue for mammaprint sample to determineher risk of recurrence andbenefit of chemo. If high riskdiseasechemotherapy AC-T will be recommended, likely before surgery, to downstage her cancer. She is interested in chemo.  -I have started her on neoadjuvant tamoxifen on 12/01/19. She tolerated poorly with nausea, and diffuse body aches. Dose reduced to 25m from  12/09/19. If she tolerates better then increase back to 216mdaily. -I started her on monthly Zoladex injection on 12/09/19, to reduce her risk of breast recurrence, and also preserve her fertility if she undergo chemotherapy. ***    2.GeneralizedWeakness, body pain, Fatigue  -Pt notes for the past 3 years she has had mid chest and mid back pain. Previously intermittent, now constant. She also notes b/l arm and leg weakness and pain along with tingling of her toes and fingers.  -This has lead to fatigue, low appetite, decreased daily function/activity.  -[place] cardiologist. They said this is related to stress, anxiety or depression. Pt feels she has an illness causing this instead.  -Her 11/25/19 PET was negative for any distant malignancy or metastasis. This is likely focal weakness.  -She has adequate peripheral strength on 11/19/19 exam, no significant tenderness of spine. -Her joint pain and fatigue has increased on Tamoxifen. We reduced dose to 1063m11/23/21). I also encouraged her to be active with exercise.  -Her Vit D level was 11.7 on 04/13/19. If low I will call in high dose Vit D. I will also check Rheumatoid and other inflammation marker.   3. Anxiety, depression  -Pt notes feeling more anxious (due to health before breast cancer), than depressed.  -She is on Lexapro, which helps some. Given drowsiness she takes half tablet. Will wean her off with half tablet every other day for 1 week. I previously called in Effexor.   4. Genetic Testing negative for pathogenetic mutations in RAD51D, and Variant of uncertain significance (VUS) detected inCHEK2at c.1556G>T (p.Arg519Leu).  -She is scheduled to meet genetic counselor Cari again later this week -Plan to discuss more on her next visit  5. Social Support  -[place] She has 2 teenage children.  [place]. Scalp Lesion -She notes recent bleeding and pain of her Birthmark of scalp.  -She plan to be seen by dermatologist, but is not able to be seen for another 4-5 months. I will see if she is able to be seen sooner.    PLAN: ***  -Lab today -Wean off Lexapro in next week.  -I called in Effexor 37.5 today. Plan to start next week.  -Proceed with Zoladex injection today  -Continue Tamoxifen and reduce to 53m44mily. I refilled today  -f/u in 2 weeks to review Mammaprint result     No problem-specific Assessment & Plan notes found for this encounter.   No orders of the defined types were placed in this encounter.  All questions were answered. The patient knows to call the clinic with any problems, questions or concerns. No barriers to learning was  detected. The total time spent in the appointment was {CHL ONC TIME VISIT - XMIWO:[id number]}.     Joslyn Devon 12/22/2019   Oneal Deputy, am acting as scribe for Truitt Merle, MD.   {Add scribe attestation statement}

## 2019-12-22 NOTE — Patient Instructions (Signed)

## 2019-12-22 NOTE — Progress Notes (Signed)
No zoladex today per MD Burr Medico, pt needs to wait another 2 weeks to receive injection.

## 2019-12-23 ENCOUNTER — Encounter: Payer: Self-pay | Admitting: Hematology

## 2019-12-23 DIAGNOSIS — N951 Menopausal and female climacteric states: Secondary | ICD-10-CM | POA: Insufficient documentation

## 2019-12-23 MED ORDER — ABEMACICLIB 50 MG PO TABS: 50.0000 mg | ORAL_TABLET | Freq: Two times a day (BID) | ORAL | 0 refills | Status: DC

## 2019-12-24 ENCOUNTER — Other Ambulatory Visit: Payer: Self-pay | Admitting: Hematology

## 2019-12-24 ENCOUNTER — Encounter: Payer: Self-pay | Admitting: *Deleted

## 2019-12-24 ENCOUNTER — Telehealth: Payer: Self-pay

## 2019-12-24 ENCOUNTER — Telehealth: Payer: Self-pay | Admitting: Pharmacist

## 2019-12-24 ENCOUNTER — Telehealth: Payer: Self-pay | Admitting: *Deleted

## 2019-12-24 DIAGNOSIS — C50411 Malignant neoplasm of upper-outer quadrant of right female breast: Secondary | ICD-10-CM

## 2019-12-24 MED ORDER — ABEMACICLIB 50 MG PO TABS: 50.0000 mg | ORAL_TABLET | Freq: Two times a day (BID) | ORAL | 0 refills | Status: DC

## 2019-12-24 NOTE — Telephone Encounter (Signed)
Oral Oncology Patient Advocate Encounter  Received notification from [facility] that prior authorization for Verzenio is required.  PA submitted on CoverMyMeds Key [id number] Status is pending  Oral Oncology Clinic will continue to follow.  [place] Patient [place] Phone [phone] Fax [phone] 12/24/2019 11:02 AM

## 2019-12-24 NOTE — Telephone Encounter (Signed)
Oral Oncology Pharmacist Encounter  Received new prescription for Verzenio (abemaciclib) for the treatment of HR positive, HER-2 negative, early stage breast cancer in conjunction with anastrozole, planned duration 2 years of until disease progression or unacceptable drug toxicity.  Prescription dose and frequency assessed for appropriateness. Per MD planning on dose titrating Verzenio up to 150 mg by mouth twice daily to improve tolerance.  CMP and CBC w/ Diff from 11/19/19 assessed, labs stable WNL.  Current medication list in Epic reviewed, no relevant/significant DDIs with Verzenio identified.  Evaluated chart and no patient barriers to medication adherence noted.   Prescription has been e-scribed to the [facility] for benefits analysis and approval.  Oral Oncology Clinic will continue to follow for insurance authorization, copayment issues, initial counseling and start date.  Leron Croak, PharmD, BCPS Hematology/Oncology Clinical Pharmacist [facility] [phone] 12/24/2019 11:19 AM

## 2019-12-24 NOTE — Telephone Encounter (Signed)
Received Mammaprint results of LOW RISK on core bx. Physician team notified.

## 2019-12-25 ENCOUNTER — Inpatient Hospital Stay: Payer: 59 | Admitting: Hematology

## 2019-12-26 NOTE — Telephone Encounter (Signed)
Oral Oncology Pharmacist Encounter   Prior Authorization for Verzenio [facility]) has been denied.     Will proceed with appeal process at this time.    Oral Oncology Clinic will continue to follow.    Leron Croak, PharmD, BCPS Hematology/Oncology Clinical Pharmacist [facility] [phone] 12/26/2019 4:01 PM

## 2019-12-29 ENCOUNTER — Encounter: Payer: Self-pay | Admitting: *Deleted

## 2019-12-31 NOTE — Telephone Encounter (Signed)
Oral Oncology Pharmacist Encounter  Appeal letter sent to [facility] with supporting documentation. Appeal faxed to: [phone]    Oral Oncology Clinic will continue to follow.    Leron Croak, PharmD, BCPS Hematology/Oncology Clinical Pharmacist [facility] [phone] 12/31/2019 1:30 PM

## 2020-01-01 NOTE — Telephone Encounter (Signed)
Oral Oncology Patient Advocate Encounter  Prior Authorization for Caitlyn Miller has been approved.    PA# [id number] Effective dates: 01/01/20 through 12/31/20  Patients co-pay is $38.24  Oral Oncology Clinic will continue to follow.   [name] Phone [phone] Fax [phone] 01/01/2020 2:51 PM

## 2020-01-01 NOTE — Telephone Encounter (Signed)
Oral Oncology Pharmacist Encounter   Received call from representative from [place] that appeal for Verzenio (abemaciclib) was overturned. Patient will be approved for 1 year.      Oral Oncology Clinic will continue to follow.   Leron Croak, PharmD, BCPS Hematology/Oncology Clinical Pharmacist [facility] [phone] 01/01/2020 8:07 AM

## 2020-01-05 ENCOUNTER — Other Ambulatory Visit: Payer: Self-pay

## 2020-01-05 DIAGNOSIS — C50411 Malignant neoplasm of upper-outer quadrant of right female breast: Secondary | ICD-10-CM

## 2020-01-05 DIAGNOSIS — Z170 Estrogen receptor positive status [ER+]: Secondary | ICD-10-CM

## 2020-01-05 MED FILL — VERZENIO 50 MG TABS: 50 | 21 days supply | Qty: 84 | Fill #0

## 2020-01-05 NOTE — Telephone Encounter (Signed)
Oral Chemotherapy Pharmacist Encounter   Attempted to reach patient to provide update and offer for initial counseling on oral medication: Verzenio (abemaciclib).   Spoke with patient's husband, patient will call back at 3:30 PM to discuss details of medication acquisition and initial counseling session.  Leron Croak, PharmD, BCPS Hematology/Oncology Clinical Pharmacist [facility] [phone] 01/05/2020 9:38 AM

## 2020-01-05 NOTE — Telephone Encounter (Signed)
Oral Chemotherapy Pharmacist Encounter  I spoke with patient and her husband for overview of: Verzenio for the treatment of early-stage, hormone-receptor positive breast cancer, plan per MD in combination with anastrozole, planned duration until disease progression or unacceptable toxicity.   Counseled patient on administration, dosing, side effects, monitoring, drug-food interactions, safe handling, storage, and disposal.  Patient will take Verzenio 50mg  tablets, 1 tablet by mouth twice daily without regard to food for 7 days, then increase to 2 tablets (100 mg total) by mouth twice daily for 7 days, and then increase to 3 tablets (150 mg total) by mouth twice daily thereafter if tolerated.  Patient knows to avoid grapefruit and grapefruit juice.  Verzenio start date: 01/12/20  Adverse effects include but are not limited to: diarrhea, fatigue, nausea, abdominal pain, hair loss, decreased blood counts, and increased liver function tests, and joint pains.  Severe, life-threatening, and/or fatal interstitial lung disease (ILD) and/or pneumonitis may occur with CDK 4/6 inhibitors.  Patient has anti-emetic on hand and knows to take it if nausea develops.   Patient will obtain anti diarrheal and alert the office of 4 or more loose stools above baseline.  Reviewed with patient importance of keeping a medication schedule and plan for any missed doses. No barriers to medication adherence identified.  Medication reconciliation performed and medication/allergy list updated.  Insurance authorization for [employer] has been obtained. Patient will pick up medication from the [facility] on 01/06/20.  Patient informed the pharmacy will reach out 5-7 days prior to needing next fill of Verzenio to coordinate continued medication acquisition to prevent break in therapy.  All questions answered.  Caitlyn Miller voiced understanding and appreciation.   Medication education handout and  medication calendar placed in mail for patient. Patient knows to call the office with questions or concerns. Oral Chemotherapy Clinic phone number provided to patient.   Leron Croak, PharmD, BCPS Hematology/Oncology Clinical Pharmacist [facility] [phone] 01/05/2020 3:36 PM

## 2020-01-06 ENCOUNTER — Inpatient Hospital Stay: Payer: 59

## 2020-01-06 ENCOUNTER — Encounter: Payer: Self-pay | Admitting: Nurse Practitioner

## 2020-01-06 ENCOUNTER — Other Ambulatory Visit: Payer: Self-pay

## 2020-01-06 ENCOUNTER — Other Ambulatory Visit: Payer: Self-pay | Admitting: Nurse Practitioner

## 2020-01-06 ENCOUNTER — Inpatient Hospital Stay (HOSPITAL_BASED_OUTPATIENT_CLINIC_OR_DEPARTMENT_OTHER): Payer: 59 | Admitting: Nurse Practitioner

## 2020-01-06 VITALS — BP 117/86 | HR 87 | Temp 96.8°F | Resp 17 | Ht 62.0 in | Wt 128.7 lb

## 2020-01-06 DIAGNOSIS — C50411 Malignant neoplasm of upper-outer quadrant of right female breast: Secondary | ICD-10-CM

## 2020-01-06 DIAGNOSIS — Z170 Estrogen receptor positive status [ER+]: Secondary | ICD-10-CM | POA: Diagnosis not present

## 2020-01-06 DIAGNOSIS — Z5111 Encounter for antineoplastic chemotherapy: Secondary | ICD-10-CM | POA: Diagnosis not present

## 2020-01-06 LAB — CBC WITH DIFFERENTIAL (CANCER CENTER ONLY)
Abs Immature Granulocytes: 0.01 10*3/uL (ref 0.00–0.07)
Basophils Absolute: 0.1 10*3/uL (ref 0.0–0.1)
Basophils Relative: 1 %
Eosinophils Absolute: 0.1 10*3/uL (ref 0.0–0.5)
Eosinophils Relative: 1 %
HCT: 37.7 % (ref 36.0–46.0)
Hemoglobin: 12.6 g/dL (ref 12.0–15.0)
Immature Granulocytes: 0 %
Lymphocytes Relative: 35 %
Lymphs Abs: 2.5 10*3/uL (ref 0.7–4.0)
MCH: 31.3 pg (ref 26.0–34.0)
MCHC: 33.4 g/dL (ref 30.0–36.0)
MCV: 93.5 fL (ref 80.0–100.0)
Monocytes Absolute: 0.6 10*3/uL (ref 0.1–1.0)
Monocytes Relative: 9 %
Neutro Abs: 3.9 10*3/uL (ref 1.7–7.7)
Neutrophils Relative %: 54 %
Platelet Count: 237 10*3/uL (ref 150–400)
RBC: 4.03 MIL/uL (ref 3.87–5.11)
RDW: 12.5 % (ref 11.5–15.5)
WBC Count: 7.1 10*3/uL (ref 4.0–10.5)
nRBC: 0 % (ref 0.0–0.2)

## 2020-01-06 LAB — CMP (CANCER CENTER ONLY)
ALT: 17 U/L (ref 0–44)
AST: 20 U/L (ref 15–41)
Albumin: 4.2 g/dL (ref 3.5–5.0)
Alkaline Phosphatase: 30 U/L — ABNORMAL LOW (ref 38–126)
Anion gap: 6 (ref 5–15)
BUN: 10 mg/dL (ref 6–20)
CO2: 25 mmol/L (ref 22–32)
Calcium: 9.1 mg/dL (ref 8.9–10.3)
Chloride: 109 mmol/L (ref 98–111)
Creatinine: 0.73 mg/dL (ref 0.44–1.00)
GFR, Estimated: >60 mL/min (ref >60)
Glucose, Bld: 88 mg/dL (ref 70–99)
Potassium: 3.9 mmol/L (ref 3.5–5.1)
Sodium: 140 mmol/L (ref 135–145)
Total Bilirubin: 0.4 mg/dL (ref 0.3–1.2)
Total Protein: 7.6 g/dL (ref 6.5–8.1)

## 2020-01-06 MED ORDER — GOSERELIN ACETATE 3.6 MG ~~LOC~~ IMPL
3.6000 mg | DRUG_IMPLANT | Freq: Once | SUBCUTANEOUS | Status: AC
  Administered 2020-01-06: 16:00:00 3.6 mg via SUBCUTANEOUS

## 2020-01-06 MED ORDER — GOSERELIN ACETATE 3.6 MG ~~LOC~~ IMPL
DRUG_IMPLANT | SUBCUTANEOUS | Status: AC
  Filled 2020-01-06: qty 3.6

## 2020-01-06 MED ORDER — ANASTROZOLE 1 MG PO TABS: 1.0000 mg | ORAL_TABLET | Freq: Every day | ORAL | 3 refills | Status: DC

## 2020-01-06 MED FILL — ANASTROZOLE 1 MG TABLET: 1 | 30 days supply | Qty: 30 | Fill #0

## 2020-01-06 NOTE — Progress Notes (Signed)
[facility]   [phone] [phone]   Clinic Follow up Note   Patient Care Team: [name], Lilia Argue, MD as PCP - General (Family Medicine) Mansouraty, Telford Nab., MD as Consulting Physician (Gastroenterology) Mauro Kaufmann, RN as Oncology Nurse Navigator Rockwell Germany, RN as Oncology Nurse Navigator Donnie Mesa, MD as Consulting Physician (General Surgery) Truitt Merle, MD as Consulting Physician (Hematology) Kyung Rudd, MD as Consulting Physician (Radiation Oncology) Contogiannis, Audrea Muscat, MD as Consulting Physician (Plastic Surgery) 01/06/2020  CHIEF COMPLAINT: Follow-up right breast cancer  SUMMARY OF ONCOLOGIC HISTORY: Oncology History Overview Note  Cancer Staging Malignant neoplasm of upper-outer quadrant of right breast in female, estrogen receptor positive ([place]) Staging form: Breast, AJCC 8th Edition - Clinical stage from 11/12/2019: Stage IIA (cT2, cN1, cM0, G2, ER+, PR+, HER2-) - Signed by Truitt Merle, MD on 11/19/2019    Malignant neoplasm of upper-outer quadrant of right breast in female, estrogen receptor positive ([place])  11/05/2019 Mammogram   IMPRESSION: Large irregular palpable mass 4.7 x 1.4 x 1.4 cm within the upper-outer right breast 11 o'clock position, 4 cm from nipple.  There is a large area of associated coarse heterogeneous calcifications within the mass. Overall findings are concerning for breast carcinoma.   Two cortically thickened right axillary lymph nodes which are indeterminate in etiology.   11/12/2019 Cancer Staging   Staging form: Breast, AJCC 8th Edition - Clinical stage from 11/12/2019: Stage IIA (cT2, cN1, cM0, G2, ER+, PR+, HER2-) - Signed by Truitt Merle, MD on 11/19/2019   11/12/2019 Initial Biopsy   Diagnosis 1. Breast, right, needle core biopsy, right - INVASIVE MAMMARY CARCINOMA, SEE COMMENT. - MAMMARY CARCINOMA IN SITU. 2. Lymph node, needle/core biopsy, right - METASTATIC MAMMARY  CARCINOMA. Microscopic Comment 1. The carcinoma appears grade 2 and measures 16 mm in greatest linear extent. E-cadherin will be ordered. Prognostic makers will be ordered. Dr. Saralyn Pilar has reviewed the case. The case was called to [facility] on 01/13/2020.    1. E-cadherin is strongly positive consistent with a ductal phenotype.   11/12/2019 Receptors her2   1. PROGNOSTIC INDICATORS Results: IMMUNOHISTOCHEMICAL AND MORPHOMETRIC ANALYSIS PERFORMED MANUALLY The tumor cells are NEGATIVE for Her2 (0). Estrogen Receptor: 100%, POSITIVE, STRONG STAINING INTENSITY Progesterone Receptor: 100%, POSITIVE, STRONG STAINING INTENSITY Proliferation Marker Ki67: 5%   11/17/2019 Initial Diagnosis   Malignant neoplasm of upper-outer quadrant of right breast in female, estrogen receptor positive ([place])   11/25/2019 Breast MRI   IMPRESSION: 1. Large area of non-mass enhancement involving the UPPER OUTER QUADRANT of the RIGHT breast measuring approximately 4.2 x 4.1 x 2.4 cm. The biopsy-proven IDC and DCIS is present at the superomedial aspect of this NME. 2. No MRI evidence of malignancy involving the LEFT breast. 3. Intact BILATERAL retropectoral implants. 4. 2 pathologically enlarged RIGHT axillary lymph nodes. One of these nodes is biopsy-proven metastatic disease. No pathologic lymphadenopathy elsewhere   11/25/2019 PET scan   IMPRESSION: 1. Two mildly enlarged hypermetabolic right axillary lymph nodes compatible with right axillary nodal metastases. 2. No additional sites of hypermetabolic metastatic disease. 3. Asymmetric indistinct upper right breast hypermetabolism, without discrete mass correlate on the CT images, compatible with known primary right breast malignancy.     11/26/2019 Genetic Testing   Negative genetic testing: no pathogenic variants detected in [facility] STAT Breast Cancer Panel.  Variant of uncertain significance (VUS) detected in CHEK2 at c.1556G>T  (p.Arg519Leu).  The report date is November 26, 2019.  The STAT Breast cancer panel offered by [facility] includes sequencing and rearrangement analysis for the following 9 genes:  ATM, BRCA1, BRCA2, CDH1, CHEK2, PALB2, PTEN, STK11 and TP53.    Results of [facility] Multi-Cancer Panel are pending.    12/01/2019 -  Neo-Adjuvant Anti-estrogen oral therapy   Neoadjuvant Tamoxifen 9m on 12/01/19. Reduced to 132mon 12/09/19.       ---Zoladex injection monthly starting 12/09/19.    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  Variants of uncertain significance detected RAD51D at c.715C>T (p.Arg239Trp) and CHEK2 at c.1556G>T (p.Arg519Leu).  The report date is December 08, 2019.   The Multi-Cancer Panel offered by [facility] includes sequencing and/or deletion duplication testing of the following 85 genes: AIP, ALK, APC, ATM, AXIN2,BAP1,  BARD1, BLM, BMPR1A, BRCA1, BRCA2, BRIP1, CASR, CDC73, CDH1, CDK4, CDKN1B, CDKN1C, CDKN2A (p14ARF), CDKN2A (p16INK4a), CEBPA, CHEK2, CTNNA1, DICER1, DIS3L2, EGFR (c.2369C>T, p.Thr790Met variant only), EPCAM (Deletion/duplication testing only), FH, FLCN, GATA2, GPC3, GREM1 (Promoter region deletion/duplication testing only), HOXB13 (c.251G>A, p.Gly84Glu), HRAS, KIT, MAX, MEN1, MET, MITF (c.952G>A, p.Glu318Lys variant only), MLH1, MSH2, MSH3, MSH6, MUTYH, NBN, NF1, NF2, NTHL1, PALB2, PDGFRA, PHOX2B, PMS2, POLD1, POLE, POT1, PRKAR1A, PTCH1, PTEN, RAD50, RAD51C, RAD51D, RB1, RECQL4, RET, RNF43, RUNX1, SDHAF2, SDHA (sequence changes only), SDHB, SDHC, SDHD, SMAD4, SMARCA4, SMARCB1, SMARCE1, STK11, SUFU, TERC, TERT, TMEM127, TP53, TSC1, TSC2, VHL, WRN and WT1.      CURRENT THERAPY: Tamoxifen initially; began Zoladex 11/2019 and changed to anastrozole on 12/21 after full ovarian suppression.  Pending Verzenio to start 12/31  INTERVAL HISTORY: Ms. BeDomeketurns for follow-up  with her husband.  Hot flashes have improved on gabapentin and Effexor.  She continues to have mid chest pain that shoots straight to her back which has been present for 2-3 years.  Pain is worse lately when she takes Tylenol with tamoxifen.  Pain is intermittent, not related to exertion/exercise.  She does not think she has reflux.  He does have mild nausea managed with Zofran.  Bowels moving normally.  Denies bleeding.  She has chills when she has hot flashes, no fever.  She has occasional cough and shortness of breath, stable.  She has pain in the right breast tumor, does not think it has changed in size.   MEDICAL HISTORY:  Past Medical History:  Diagnosis Date  . Abnormal Pap smear   . Acid reflux   . Anemia   . Anxiety   . Breast cancer ([place]  . Decreased appetite 10/08  . Depression   . Endometriosis 10/2004  . Epigastric pain 10/2006  . Family history of breast cancer 11/19/2019  . FH: migraines   . GBS carrier   . H/O amenorrhea 06/2006  . H/O dyspareunia 7/07  . H/O fatigue   . H/O nausea and vomiting 10/2006  . H/O rubella   . H/O varicella   . Hyperemesis arising during pregnancy    First pregnancy  . Irregular periods/menstrual cycles 02/2005  . 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 Pelvic pain 09/2008    SURGICAL HISTORY: Past Surgical History:  Procedure Laterality Date  . AUGMENTATION MAMMAPLASTY Bilateral [id number] silicone   . WISDOM TOOTH EXTRACTION      I have reviewed the social history and family history with the patient and they are unchanged from previous note.  ALLERGIES:  is allergic to ibuprofen.  MEDICATIONS:  Current Outpatient Medications  Medication Sig Dispense Refill  .  abemaciclib (VERZENIO) 50 MG tablet Take 1 tablet (50 mg total) by mouth 2 (two) times daily. Increase to 181m q12h on week 2 and 1561mq12h on week 3 if tolerates well. Swallow tablets whole. Do not chew, crush, or split tablets before swallowing. 84 tablet 0  .  anastrozole (ARIMIDEX) 1 MG tablet Take 1 tablet (1 mg total) by mouth daily. 30 tablet 3  . clindamycin (CLINDAGEL) 1 % gel Apply topically 2 (two) times daily. 30 g 0  . escitalopram (LEXAPRO) 10 MG tablet Take 1 tablet (10 mg total) by mouth at bedtime. 90 tablet 1  . gabapentin (NEURONTIN) 100 MG capsule Take 1 capsule (100 mg total) by mouth at bedtime. 60 capsule 1  . hydrOXYzine (ATARAX/VISTARIL) 25 MG tablet Take 1 tablet (25 mg total) by mouth at bedtime as needed for anxiety. 90 tablet 0  . ondansetron (ZOFRAN ODT) 4 MG disintegrating tablet Take 1 tablet (4 mg total) by mouth every 8 (eight) hours as needed for nausea or vomiting. 30 tablet 0  . pantoprazole (PROTONIX) 40 MG tablet Take 1 tablet (40 mg total) by mouth daily as needed. 30 tablet 5  . venlafaxine (EFFEXOR) 37.5 MG tablet TAKE 1 TABLET(37.5 MG) BY MOUTH TWICE DAILY 30 tablet 4   No current facility-administered medications for this visit.    PHYSICAL EXAMINATION: ECOG PERFORMANCE STATUS: 1 - Symptomatic but completely ambulatory  Vitals:   01/06/20 1522  BP: 117/86  Pulse: 87  Resp: 17  Temp: (!) 96.8 F (36 C)  SpO2: 98%   Filed Weights   01/06/20 1522  Weight: 128 lb 11.2 oz (58.4 kg)    GENERAL:alert, no distress and comfortable SKIN: No rash EYES: sclera clear NECK: Without mass LYMPH:  no palpable cervical or supraclavicular lymphadenopathy  LUNGS: clear with normal breathing effort HEART: regular rate & rhythm, no lower extremity edema Musculoskeletal: No focal sternal or spinal tenderness NEURO: alert & oriented x 3 with fluent speech, no focal motor/sensory deficits Breast exam: Breasts are symmetric without nipple discharge or inversion.  Palpable mass in the right upper quadrant measures approximately 3 cm.  Left breast exam deferred  LABORATORY DATA:  I have reviewed the data as listed CBC Latest Ref Rng & Units 01/06/2020 11/19/2019 05/01/2017  WBC 4.0 - 10.5 K/uL 7.1 6.4 6.4  Hemoglobin  12.0 - 15.0 g/dL 12.6 12.0 12.8  Hematocrit 36.0 - 46.0 % 37.7 36.3 39.5  Platelets 150 - 400 K/uL 237 281 273     CMP Latest Ref Rng & Units 01/06/2020 11/19/2019 05/01/2017  Glucose 70 - 99 mg/dL 88 72 82  BUN 6 - 20 mg/dL _0 Creatinine 0.44 - 1.00 mg/dL 0.73 0.72 0.62  Sodium 135 - 145 mmol/L 140 140 142  Potassium 3.5 - 5.1 mmol/L 3.9 4.3 4.5  Chloride 98 - 111 mmol/L 109 107 104  CO2 22 - 32 mmol/L _1 Calcium 8.9 - 10.3 mg/dL 9.1 9.2 9.8  Total Protein 6.5 - 8.1 g/dL 7.6 7.4 7.4  Total Bilirubin 0.3 - 1.2 mg/dL 0.4 0.5 0.4  Alkaline Phos 38 - 126 U/L 30(L) 42 40  AST 15 - 41 U/L _2 ALT 0 - 44 U/L 17 33 23      RADIOGRAPHIC STUDIES: I have personally reviewed the radiological images as listed and agreed with the findings in the report. No results found.   ASSESSMENT & PLAN: ImRalph Benavidezs a [age].o. female with  1.Malignant neoplasm of upper-outer quadrant of right breast, StageIIA, c(T2N1M0), ER+/PR+/HER2-, Levester Fresh, Mammaprint luminal type A, low risk -She has a 4.7cm right breast mass at 11:00 position andtwo abnormalright axillary LN. Her10/27/21biopsy shows grade II invasive ductal carcinomawithmetastasisto her LN, ER/PRstrongly positive, HER2 negative, low Ki67.  -Her 11/2019 breast MRI and PET scan negative for contralateral malignancy or distant metastasis  -due to her large tumor, she needsmastectomy if she proceeded with surgery first. She has been seen by Dr. Georgette Dover.Her mastectomy will be complicated by history of implant placement. -MammaPrint showed luminal type A, low risk disease, consistent with strongly ER and PR positivity, negative HER-2 and low Ki-67.  She is unlikely to benefit from chemotherapy, and neoadjuvant chemotherapy is not recommended.  -She started neoadjuvant tamoxifen on 12/01/19, tolerated poorly with nausea, and diffuse body aches. Dose reduced to 59m from 12/09/19. She is tolerating much better now.   -Started monthly Zoladex injection on 12/09/19, she has severe hot flushes, improved on gabapentin and effexor  -Given her strong desire to have lumpectomy, instead of mastectomy, Dr. FBurr Medicorecommend to continue neoadjuvant endocrine therapy. She will switch to AI 01/07/20 now that ovarian function is suppressed.  -Dr. FBurr Medicoalso recommends the addition of CDK 4/6 inhibitor Verzenio plus AI to downstage her cancer.  She will start around 01/16/2020 -The plan is to repeat mammogram and ultrasound in 2-3 months for monitoring.  If she has clinical concern of disease progression, will proceed with surgery.   2.GeneralizedWeakness, body pain, Fatigue  -Pt notes for the past 3 years she has had mid chest and mid back pain. Previously intermittent, now constant. She also notes b/l arm and leg weakness and pain along with tingling of her toes and fingers.  -This has lead to fatigue, low appetite, decreased daily function/activity.  -[place]. They said this is related to stress, anxiety or depression. Pt feels she has an illness causing this instead.  -Her 11/25/19 PET was negative for any distant malignancy or metastasis.  -Stable  3. Anxiety, depression  -More anxious than depressed -Weaned off Lexapro and has started Effexor which is helpful  4. Genetic Testingnegative for pathogenetic mutationsin RAD51D, andVariant of uncertain significance (VUS) detected inCHEK2at c.1556G>T (p.Arg519Leu). -She is scheduled to meet genetic counselor Cariagain later this week -Plan to discuss more on her next visit  5. Social Support  -[place]. She has 2 teenage children. [place]  Disposition: Ms. BFrierappears stable.  Breast exam shows stable to minimally decreased size of right breast mass.  CBC and CMP are unremarkable.   Now that ovarian function is fully suppressed she will stop tamoxifen and change to anastrozole starting 01/07/20.  If she tolerates well she will [facility].  We reviewed potential side effects, symptom management, and the goal of care.  She agrees to proceed.  The significance of her chest and back pain are unknown.  PET shows no abnormalities in the areas. The pain has been stable for 2-3 years.  Exam is benign.  I do not feel this is cardiac.  She is on PPI.  She declined cardiology referral at this time.  Will monitor closely.  She will proceed with Zoladex today as planned, and continue monthly, return for follow-up 01/26/2020.  All questions were answered. The patient knows to call the clinic with  any problems, questions or concerns. No barriers to learning were detected.  Total encounter time was 30 minutes.     Alla Feeling, NP 01/06/20

## 2020-01-06 NOTE — Patient Instructions (Signed)

## 2020-01-07 ENCOUNTER — Telehealth: Payer: Self-pay | Admitting: Hematology

## 2020-01-07 ENCOUNTER — Telehealth: Payer: Self-pay | Admitting: Nurse Practitioner

## 2020-01-07 NOTE — Telephone Encounter (Signed)
Scheduled appointments per 12/21 los. Spoke to patient's husband who is aware of appointments date and times.

## 2020-01-07 NOTE — Telephone Encounter (Signed)
Rescheduled appt. To later in the day per husband's request. Patient's husband is aware of changes.

## 2020-01-20 ENCOUNTER — Ambulatory Visit: Payer: 59 | Admitting: Hematology

## 2020-01-26 ENCOUNTER — Encounter: Payer: Self-pay | Admitting: *Deleted

## 2020-01-26 NOTE — Progress Notes (Signed)
[facility] [facility]   [phone] [phone]   Clinic Follow up Note   Patient Care Team: [name], Lilia Argue, MD as PCP - General (Family Medicine) Mansouraty, Telford Nab., MD as Consulting Physician (Gastroenterology) Mauro Kaufmann, RN as Oncology Nurse Navigator Rockwell Germany, RN as Oncology Nurse Navigator Donnie Mesa, MD as Consulting Physician (General Surgery) Truitt Merle, MD as Consulting Physician (Hematology) Kyung Rudd, MD as Consulting Physician (Radiation Oncology) Contogiannis, Audrea Muscat, MD as Consulting Physician (Plastic Surgery)  Date of Service:  01/27/2020  CHIEF COMPLAINT: F/u of right breast cancer  SUMMARY OF ONCOLOGIC HISTORY: Oncology History Overview Note  Cancer Staging Malignant neoplasm of upper-outer quadrant of right breast in female, estrogen receptor positive ([place]) Staging form: Breast, AJCC 8th Edition - Clinical stage from 11/12/2019: Stage IIA (cT2, cN1, cM0, G2, ER+, PR+, HER2-) - Signed by Truitt Merle, MD on 11/19/2019    Malignant neoplasm of upper-outer quadrant of right breast in female, estrogen receptor positive ([place])  11/05/2019 Mammogram   IMPRESSION: Large irregular palpable mass 4.7 x 1.4 x 1.4 cm within the upper-outer right breast 11 o'clock position, 4 cm from nipple.  There is a large area of associated coarse heterogeneous calcifications within the mass. Overall findings are concerning for breast carcinoma.   Two cortically thickened right axillary lymph nodes which are indeterminate in etiology.   11/12/2019 Cancer Staging   Staging form: Breast, AJCC 8th Edition - Clinical stage from 11/12/2019: Stage IIA (cT2, cN1, cM0, G2, ER+, PR+, HER2-) - Signed by Truitt Merle, MD on 11/19/2019   11/12/2019 Initial Biopsy   Diagnosis 1. Breast, right, needle core biopsy, right - INVASIVE MAMMARY CARCINOMA, SEE COMMENT. - MAMMARY CARCINOMA IN SITU. 2. Lymph node, needle/core biopsy, right -  METASTATIC MAMMARY CARCINOMA. Microscopic Comment 1. The carcinoma appears grade 2 and measures 16 mm in greatest linear extent. E-cadherin will be ordered. Prognostic makers will be ordered. Dr. Saralyn Pilar has reviewed the case. The case was called to [place] on 01/13/2020.    1. E-cadherin is strongly positive consistent with a ductal phenotype.   11/12/2019 Receptors her2   1. PROGNOSTIC INDICATORS Results: IMMUNOHISTOCHEMICAL AND MORPHOMETRIC ANALYSIS PERFORMED MANUALLY The tumor cells are NEGATIVE for Her2 (0). Estrogen Receptor: 100%, POSITIVE, STRONG STAINING INTENSITY Progesterone Receptor: 100%, POSITIVE, STRONG STAINING INTENSITY Proliferation Marker Ki67: 5%   11/17/2019 Initial Diagnosis   Malignant neoplasm of upper-outer quadrant of right breast in female, estrogen receptor positive ([place])   11/25/2019 Breast MRI   IMPRESSION: 1. Large area of non-mass enhancement involving the UPPER OUTER QUADRANT of the RIGHT breast measuring approximately 4.2 x 4.1 x 2.4 cm. The biopsy-proven IDC and DCIS is present at the superomedial aspect of this NME. 2. No MRI evidence of malignancy involving the LEFT breast. 3. Intact BILATERAL retropectoral implants. 4. 2 pathologically enlarged RIGHT axillary lymph nodes. One of these nodes is biopsy-proven metastatic disease. No pathologic lymphadenopathy elsewhere   11/25/2019 PET scan   IMPRESSION: 1. Two mildly enlarged hypermetabolic right axillary lymph nodes compatible with right axillary nodal metastases. 2. No additional sites of hypermetabolic metastatic disease. 3. Asymmetric indistinct upper right breast hypermetabolism, without discrete mass correlate on the CT images, compatible with known primary right breast malignancy.     11/26/2019 Genetic Testing   Negative genetic testing: no pathogenic variants detected in [facility] STAT Breast Cancer Panel.  Variant of uncertain significance (VUS) detected in  CHEK2 at c.1556G>T (p.Arg519Leu).  The report date  is November 26, 2019.   The STAT Breast cancer panel offered by [facility] includes sequencing and rearrangement analysis for the following 9 genes:  ATM, BRCA1, BRCA2, CDH1, CHEK2, PALB2, PTEN, STK11 and TP53.    Results of [facility] Multi-Cancer Panel are pending.    12/01/2019 -  Neo-Adjuvant Anti-estrogen oral therapy   Neoadjuvant Tamoxifen 78m on 12/01/19. Reduced to 191mon 12/09/19.       ---Zoladex injection monthly starting 12/09/19.       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  Variants of uncertain significance detected RAD51D at c.715C>T (p.Arg239Trp) and CHEK2 at c.1556G>T (p.Arg519Leu).  The report date is December 08, 2019.   The Multi-Cancer Panel offered by [facility] includes sequencing and/or deletion duplication testing of the following 85 genes: AIP, ALK, APC, ATM, AXIN2,BAP1,  BARD1, BLM, BMPR1A, BRCA1, BRCA2, BRIP1, CASR, CDC73, CDH1, CDK4, CDKN1B, CDKN1C, CDKN2A (p14ARF), CDKN2A (p16INK4a), CEBPA, CHEK2, CTNNA1, DICER1, DIS3L2, EGFR (c.2369C>T, p.Thr790Met variant only), EPCAM (Deletion/duplication testing only), FH, FLCN, GATA2, GPC3, GREM1 (Promoter region deletion/duplication testing only), HOXB13 (c.251G>A, p.Gly84Glu), HRAS, KIT, MAX, MEN1, MET, MITF (c.952G>A, p.Glu318Lys variant only), MLH1, MSH2, MSH3, MSH6, MUTYH, NBN, NF1, NF2, NTHL1, PALB2, PDGFRA, PHOX2B, PMS2, POLD1, POLE, POT1, PRKAR1A, PTCH1, PTEN, RAD50, RAD51C, RAD51D, RB1, RECQL4, RET, RNF43, RUNX1, SDHAF2, SDHA (sequence changes only), SDHB, SDHC, SDHD, SMAD4, SMARCA4, SMARCB1, SMARCE1, STK11, SUFU, TERC, TERT, TMEM127, TP53, TSC1, TSC2, VHL, WRN and WT1.       CURRENT THERAPY:  Neoadjuvant Tamoxifen 2084mily startedon 12/01/19. Reduced to 66m64m 11/23/21due to poor tolerance. Zoladex injection  monthly starting 12/09/19.Switched Tamoxifen to Anastrozole on 01/06/20. Verzenio started on 01/12/2020, dose reduced to 50mg65mand 100mg 67m/2022 due to poor tolerance    INTERVAL HISTORY:  Rand Fayetta Sorensonre for a follow up. She presents to the clinic with her husband.  She started anastrozole about 3 weeks ago, tolerated well overall with mild fatigue.  She started Verzenio about 2 weeks ago, could not tolerate 150 mg twice daily, due to severe fatigue (almost bedbound), worsening body aches, and palpitation.  She has reduced dose to 100 mg twice daily, but is still very fatigued, is only able to do her ADLs, not as activities.  No diarrhea, fever, or chills.  She still has diffuse body pain, and the palpitation, feels like her heart is going to jump out.  Moderate hot flashes, does not sleep well at night, she takes gabapentin 100 mg at night which helps.  Appetite is low, weight is overall stable. BM normal.   All other systems were reviewed with the patient and are negative.  MEDICAL HISTORY:  Past Medical History:  Diagnosis Date  . Abnormal Pap smear   . Acid reflux   . Anemia   . Anxiety   . Breast cancer (HCC)  [place] 10/08  . Depression   . Endometriosis 10/2004  . Epigastric pain 10/2006  . Family history of breast cancer 11/19/2019  . FH: migraines   . GBS carrier   . H/O amenorrhea 06/2006  . H/O dyspareunia 7/07  . H/O fatigue   . H/O nausea and vomiting 10/2006  . H/O rubella   . H/O varicella   . Hyperemesis arising during pregnancy    First pregnancy  . Irregular periods/menstrual cycles 02/2005  . 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 Pelvic pain 09/2008  SURGICAL HISTORY: Past Surgical History:  Procedure Laterality Date  . AUGMENTATION MAMMAPLASTY Bilateral 8657   silicone   . WISDOM TOOTH EXTRACTION      I have reviewed the social history and family history with the patient and they are unchanged from previous  note.  ALLERGIES:  is allergic to ibuprofen.  MEDICATIONS:  Current Outpatient Medications  Medication Sig Dispense Refill  . ondansetron (ZOFRAN ODT) 8 MG disintegrating tablet Take 1 tablet (8 mg total) by mouth every 8 (eight) hours as needed for nausea or vomiting. 30 tablet 2  . abemaciclib (VERZENIO) 50 MG tablet Take 1 tablet (50 mg total) by mouth 2 (two) times daily. Increase to 162m q12h on week 2 and 1524mq12h on week 3 if tolerates well. Swallow tablets whole. Do not chew, crush, or split tablets before swallowing. 84 tablet 0  . anastrozole (ARIMIDEX) 1 MG tablet Take 1 tablet (1 mg total) by mouth daily. 30 tablet 3  . clindamycin (CLINDAGEL) 1 % gel Apply topically 2 (two) times daily. 30 g 0  . gabapentin (NEURONTIN) 100 MG capsule Take 1 capsule (100 mg total) by mouth at bedtime. 60 capsule 1  . pantoprazole (PROTONIX) 40 MG tablet Take 1 tablet (40 mg total) by mouth daily as needed. 30 tablet 5  . venlafaxine (EFFEXOR) 37.5 MG tablet TAKE 1 TABLET(37.5 MG) BY MOUTH TWICE DAILY 30 tablet 4   No current facility-administered medications for this visit.    PHYSICAL EXAMINATION: ECOG PERFORMANCE STATUS: 3 - Symptomatic, >50% confined to bed  Vitals:   01/27/20 1220  BP: 103/66  Pulse: 72  Resp: 18  Temp: 98.2 F (36.8 C)  SpO2: 100%   Filed Weights   01/27/20 1220  Weight: 127 lb 8 oz (57.8 kg)    GENERAL:alert, no distress and comfortable SKIN: skin color, texture, turgor are normal, no rashes or significant lesions EYES: normal, Conjunctiva are pink and non-injected, sclera clear NECK: supple, thyroid normal size, non-tender, without nodularity LYMPH:  no palpable lymphadenopathy in the cervical, axillary  LUNGS: clear to auscultation and percussion with normal breathing effort HEART: regular rate & rhythm and no murmurs and no lower extremity edema ABDOMEN:abdomen soft, non-tender and normal bowel sounds Musculoskeletal:no cyanosis of digits and no  clubbing  NEURO: alert & oriented x 3 with fluent speech, no focal motor/sensory deficits Breasts: Breast inspection showed them to be symmetrical with no nipple discharge. Palpation of the right breasts shows a 2.5X3.5cm mass in RUQ, and a 1cm node in right axilla. Left breast and axilla revealed no obvious mass that I could appreciate.   LABORATORY DATA:  I have reviewed the data as listed CBC Latest Ref Rng & Units 01/27/2020 01/06/2020 11/19/2019  WBC 4.0 - 10.5 K/uL 5.7 7.1 6.4  Hemoglobin 12.0 - 15.0 g/dL 12.2 12.6 12.0  Hematocrit 36.0 - 46.0 % 36.9 37.7 36.3  Platelets 150 - 400 K/uL 209 237 281     CMP Latest Ref Rng & Units 01/27/2020 01/06/2020 11/19/2019  Glucose 70 - 99 mg/dL 94 88 72  BUN 6 - 20 mg/dL _0 Creatinine 0.44 - 1.00 mg/dL 0.82 0.73 0.72  Sodium 135 - 145 mmol/L 140 140 140  Potassium 3.5 - 5.1 mmol/L 4.5 3.9 4.3  Chloride 98 - 111 mmol/L 107 109 107  CO2 22 - 32 mmol/L _1 Calcium 8.9 - 10.3 mg/dL 9.2 9.1 9.2  Total Protein 6.5 - 8.1 g/dL 7.1 7.6 7.4  Total  Bilirubin 0.3 - 1.2 mg/dL 0.4 0.4 0.5  Alkaline Phos 38 - 126 U/L 34(L) 30(L) 42  AST 15 - 41 U/L _0 ALT 0 - 44 U/L 17 17 33      RADIOGRAPHIC STUDIES: I have personally reviewed the radiological images as listed and agreed with the findings in the report. No results found.   ASSESSMENT & PLAN:  Jolina Symonds is a 35 y.o. female with    1.Malignant neoplasm of upper-outer quadrant of right breast, StageIIA, c(T2N1M0), ER+/PR+/HER2-, Levester Fresh, Mammaprint luminal type A, low risk -She has a 4.7cm right breast mass at 11:00 position andtwo abnormalright axillary LN. Her10/27/21biopsy shows grade II invasive ductal carcinomawithmetastasisto her LN, ER/PRstrongly positive, HER2 negative, low Ki67.  -Her 11/2019 breast MRI and PET scan did not indicate left breast or distant malignancy or metastasis. -due to her large tumor, she needsmastectomy if she proceeded with surgery  first. She has been seen by Dr. Georgette Dover.Her mastectomy will be complicated by history of implant placement. -Mammaprint showed low risk disease, she would unlikely benefit from chemotherapy, and neoadjuvant chemotherapy is not recommended.  -I have started her on neoadjuvant tamoxifen on 12/01/19. She tolerated poorly with nausea, and diffuse body aches. Dose reduced to 51m from 12/09/19.  -I started her on monthly Zoladex injection on 12/09/19. Then switched Tamoxifen to Anastrozole on 01/06/20. -She is not able to tolerate a full dose Verzenio, performance status regular due to severe fatigue.  I will reduce her dose to 50 mg in the morning and 100 mg at night -Labs reviewed, CBC and CMP are within normal limits.  Exam showed her right breast mass and axillary adenopathy has decreased.  -Continue anastrozole and Verzenio, plan to repeat ultrasound of right breast and axilla before next visit in 5 weeks.   2.GeneralizedWeakness, body pain, Fatigue  -Pt notes for the past 3 years she has had mid chest and mid back pain. Previously intermittent, now constant. She also notes b/l arm and leg weakness and pain along with tingling of her toes and fingers.  -This has lead to fatigue, low appetite, decreased daily function/activity.  -[place]. They said this is related to stress, anxiety or depression. Pt feels she has an illness causing this instead.  -Her 11/25/19 PET was negative for any distant malignancy or metastasis.  -I will refer her to rheumatology to rule out fibromyalgia      3. Anxiety, depression  -Pt notes feeling more anxious (due to health before breast cancer), than depressed.  -She was on Lexapro,  I switched her to  Effexor, she is tolerating better   4. Genetic Testingnegative for pathogenetic mutationsin RAD51D, andVariant of uncertain significance (VUS) detected inCHEK2at c.1556G>T  (p.Arg519Leu). -She has discussed with our genetic consoler   5. Social Support  -[place]. She has 2 teenage children. [place]  6. Scalp Lesion -She notes recent bleeding and pain of her Birthmark of scalp.  -f/u with dermatology    PLAN: -continue Anastrozole  -due to her poor tolerance to Verzenio, will reduce dose to 50 mg in the morning and 100 mg at the evening -Increase gabapentin to 200 mg at night -Zoladex injection next week and continue every 4 weeks -Phone visit in 2 to 3 weeks -Labs and follow-up and injection in 5 weeks, with ultrasound  of right breast and axilla to evaluate her response to neoadjuvant endocrine therapy -Rheumatology referral to rule out fibromyalgia   No problem-specific Assessment & Plan notes found for this encounter.   Orders Placed This Encounter  Procedures  . US BREAST LTD UNI RIGHT INC AXILLA    Standing Status:   Future    Standing Expiration Date:   01/26/2021    Order Specific Question:   Reason for Exam (SYMPTOM  OR DIAGNOSIS REQUIRED)    Answer:   f/u on right breast mass and axillary nodes, she is on neoadjuvant antiestrogen therapy since 11/2019    Order Specific Question:   Preferred imaging location?    Answer:   [facility]  . Ambulatory referral to Rheumatology    Referral Priority:   Routine    Referral Type:   Consultation    Referral Reason:   Specialty Services Required    Requested Specialty:   Rheumatology    Number of Visits Requested:   1   All questions were answered. The patient knows to call the clinic with any problems, questions or concerns. No barriers to learning was detected. The total time spent in the appointment was 30 minutes.     Truitt Merle, MD 01/27/2020   I, Joslyn Devon, am acting as scribe for Truitt Merle, MD.   I have reviewed the above documentation for accuracy and completeness, and I  agree with the above.

## 2020-01-27 ENCOUNTER — Ambulatory Visit: Payer: 59 | Admitting: Hematology

## 2020-01-27 ENCOUNTER — Telehealth: Payer: Self-pay | Admitting: Hematology

## 2020-01-27 ENCOUNTER — Other Ambulatory Visit: Payer: 59

## 2020-01-27 ENCOUNTER — Other Ambulatory Visit: Payer: Self-pay

## 2020-01-27 ENCOUNTER — Inpatient Hospital Stay (HOSPITAL_BASED_OUTPATIENT_CLINIC_OR_DEPARTMENT_OTHER): Payer: 59 | Admitting: Hematology

## 2020-01-27 ENCOUNTER — Inpatient Hospital Stay: Payer: 59 | Attending: Hematology

## 2020-01-27 ENCOUNTER — Encounter: Payer: Self-pay | Admitting: Hematology

## 2020-01-27 VITALS — BP 103/66 | HR 72 | Temp 98.2°F | Resp 18 | Ht 62.0 in | Wt 127.5 lb

## 2020-01-27 DIAGNOSIS — F32A Depression, unspecified: Secondary | ICD-10-CM | POA: Diagnosis not present

## 2020-01-27 DIAGNOSIS — Z170 Estrogen receptor positive status [ER+]: Secondary | ICD-10-CM | POA: Insufficient documentation

## 2020-01-27 DIAGNOSIS — Z5111 Encounter for antineoplastic chemotherapy: Secondary | ICD-10-CM | POA: Insufficient documentation

## 2020-01-27 DIAGNOSIS — F419 Anxiety disorder, unspecified: Secondary | ICD-10-CM | POA: Insufficient documentation

## 2020-01-27 DIAGNOSIS — R110 Nausea: Secondary | ICD-10-CM

## 2020-01-27 DIAGNOSIS — C50411 Malignant neoplasm of upper-outer quadrant of right female breast: Secondary | ICD-10-CM

## 2020-01-27 DIAGNOSIS — R531 Weakness: Secondary | ICD-10-CM | POA: Diagnosis not present

## 2020-01-27 DIAGNOSIS — C773 Secondary and unspecified malignant neoplasm of axilla and upper limb lymph nodes: Secondary | ICD-10-CM | POA: Insufficient documentation

## 2020-01-27 LAB — CBC WITH DIFFERENTIAL (CANCER CENTER ONLY)
Abs Immature Granulocytes: 0.01 10*3/uL (ref 0.00–0.07)
Basophils Absolute: 0 10*3/uL (ref 0.0–0.1)
Basophils Relative: 1 %
Eosinophils Absolute: 0.1 10*3/uL (ref 0.0–0.5)
Eosinophils Relative: 1 %
HCT: 36.9 % (ref 36.0–46.0)
Hemoglobin: 12.2 g/dL (ref 12.0–15.0)
Immature Granulocytes: 0 %
Lymphocytes Relative: 48 %
Lymphs Abs: 2.8 10*3/uL (ref 0.7–4.0)
MCH: 31.1 pg (ref 26.0–34.0)
MCHC: 33.1 g/dL (ref 30.0–36.0)
MCV: 94.1 fL (ref 80.0–100.0)
Monocytes Absolute: 0.3 10*3/uL (ref 0.1–1.0)
Monocytes Relative: 6 %
Neutro Abs: 2.5 10*3/uL (ref 1.7–7.7)
Neutrophils Relative %: 44 %
Platelet Count: 209 10*3/uL (ref 150–400)
RBC: 3.92 MIL/uL (ref 3.87–5.11)
RDW: 12.4 % (ref 11.5–15.5)
WBC Count: 5.7 10*3/uL (ref 4.0–10.5)
nRBC: 0 % (ref 0.0–0.2)

## 2020-01-27 LAB — CMP (CANCER CENTER ONLY)
ALT: 17 U/L (ref 0–44)
AST: 17 U/L (ref 15–41)
Albumin: 3.8 g/dL (ref 3.5–5.0)
Alkaline Phosphatase: 34 U/L — ABNORMAL LOW (ref 38–126)
Anion gap: 6 (ref 5–15)
BUN: 11 mg/dL (ref 6–20)
CO2: 27 mmol/L (ref 22–32)
Calcium: 9.2 mg/dL (ref 8.9–10.3)
Chloride: 107 mmol/L (ref 98–111)
Creatinine: 0.82 mg/dL (ref 0.44–1.00)
GFR, Estimated: >60 mL/min (ref >60)
Glucose, Bld: 94 mg/dL (ref 70–99)
Potassium: 4.5 mmol/L (ref 3.5–5.1)
Sodium: 140 mmol/L (ref 135–145)
Total Bilirubin: 0.4 mg/dL (ref 0.3–1.2)
Total Protein: 7.1 g/dL (ref 6.5–8.1)

## 2020-01-27 MED ORDER — ONDANSETRON 4 MG PO TBDP: 4.0000 mg | ORAL_TABLET | Freq: Three times a day (TID) | ORAL | 1 refills | Status: DC | PRN

## 2020-01-27 MED ORDER — ONDANSETRON 8 MG PO TBDP: 8.0000 mg | ORAL_TABLET | Freq: Three times a day (TID) | ORAL | 2 refills | Status: DC | PRN

## 2020-01-27 NOTE — Telephone Encounter (Signed)
Scheduled appointments per 1/11 los. Spoke to patient who is aware of appointments dates and times.  

## 2020-01-29 ENCOUNTER — Encounter: Payer: Self-pay | Admitting: *Deleted

## 2020-02-03 ENCOUNTER — Inpatient Hospital Stay: Payer: 59

## 2020-02-03 ENCOUNTER — Other Ambulatory Visit: Payer: Self-pay

## 2020-02-03 VITALS — BP 113/76 | HR 94 | Resp 18

## 2020-02-03 DIAGNOSIS — C50411 Malignant neoplasm of upper-outer quadrant of right female breast: Secondary | ICD-10-CM

## 2020-02-03 DIAGNOSIS — Z170 Estrogen receptor positive status [ER+]: Secondary | ICD-10-CM

## 2020-02-03 DIAGNOSIS — Z5111 Encounter for antineoplastic chemotherapy: Secondary | ICD-10-CM | POA: Diagnosis not present

## 2020-02-03 MED ORDER — GOSERELIN ACETATE 3.6 MG ~~LOC~~ IMPL
DRUG_IMPLANT | SUBCUTANEOUS | Status: AC
  Filled 2020-02-03: qty 3.6

## 2020-02-03 MED ORDER — GOSERELIN ACETATE 3.6 MG ~~LOC~~ IMPL
3.6000 mg | DRUG_IMPLANT | Freq: Once | SUBCUTANEOUS | Status: AC
  Administered 2020-02-03: 3.6 mg via SUBCUTANEOUS

## 2020-02-04 ENCOUNTER — Telehealth: Payer: Self-pay | Admitting: Pharmacist

## 2020-02-04 ENCOUNTER — Encounter: Payer: Self-pay | Admitting: Hematology

## 2020-02-04 ENCOUNTER — Other Ambulatory Visit: Payer: Self-pay | Admitting: Hematology

## 2020-02-04 DIAGNOSIS — C50411 Malignant neoplasm of upper-outer quadrant of right female breast: Secondary | ICD-10-CM

## 2020-02-04 DIAGNOSIS — Z170 Estrogen receptor positive status [ER+]: Secondary | ICD-10-CM

## 2020-02-04 NOTE — Telephone Encounter (Signed)
Oral Oncology Pharmacist Encounter  Patient's insurance now requires that [facility] dispense Verzenio (abemaciclib). Future fills will need to be redirected to [facility].  Pharmacy added and updated in patient's chart.    Leron Croak, PharmD, BCPS Hematology/Oncology Clinical Pharmacist [facility] [phone] 02/04/2020 12:22 PM

## 2020-02-05 ENCOUNTER — Other Ambulatory Visit: Payer: Self-pay | Admitting: Nurse Practitioner

## 2020-02-05 ENCOUNTER — Other Ambulatory Visit: Payer: Self-pay

## 2020-02-05 DIAGNOSIS — Z170 Estrogen receptor positive status [ER+]: Secondary | ICD-10-CM

## 2020-02-05 DIAGNOSIS — C50411 Malignant neoplasm of upper-outer quadrant of right female breast: Secondary | ICD-10-CM

## 2020-02-05 MED ORDER — ANASTROZOLE 1 MG PO TABS: 1.0000 mg | ORAL_TABLET | Freq: Every day | ORAL | 3 refills | Status: DC

## 2020-02-05 MED ORDER — ABEMACICLIB 50 MG PO TABS: ORAL_TABLET | ORAL | 0 refills | Status: DC

## 2020-02-05 MED ORDER — ABEMACICLIB 50 MG PO TABS: 50.0000 mg | ORAL_TABLET | Freq: Two times a day (BID) | ORAL | 0 refills | Status: DC

## 2020-02-05 MED FILL — ANASTROZOLE 1 MG TABLET: 1 | 30 days supply | Qty: 30 | Fill #1

## 2020-02-05 NOTE — Progress Notes (Signed)
error 

## 2020-02-10 ENCOUNTER — Encounter: Payer: Self-pay | Admitting: *Deleted

## 2020-02-10 ENCOUNTER — Inpatient Hospital Stay (HOSPITAL_BASED_OUTPATIENT_CLINIC_OR_DEPARTMENT_OTHER): Payer: 59 | Admitting: Nurse Practitioner

## 2020-02-10 ENCOUNTER — Telehealth: Payer: Self-pay | Admitting: Nurse Practitioner

## 2020-02-10 DIAGNOSIS — Z170 Estrogen receptor positive status [ER+]: Secondary | ICD-10-CM

## 2020-02-10 DIAGNOSIS — C50411 Malignant neoplasm of upper-outer quadrant of right female breast: Secondary | ICD-10-CM

## 2020-02-10 NOTE — Telephone Encounter (Signed)
I called little early before our scheduled call, no answer and I left voicemail to call back. I called again later, Ms. Poynter stepped out but husband answered. Informed me she is tolerating 100 mg BID verzenio and doing OK but still tired, n/v, and hot flashes but manageable. I made him aware of the [place] appointment on 2/14. I encouraged him to let pt reach out to [place] if she has new/worsening concerns or questions. He understands and appreciates the call.   Cira Rue, NP

## 2020-02-10 NOTE — Progress Notes (Signed)
I was not able to connect with patient x2 for today's virtual visit. See telephone note for other conversation with family member.   Cira Rue, NP

## 2020-02-11 ENCOUNTER — Telehealth: Payer: Self-pay | Admitting: Nurse Practitioner

## 2020-02-11 NOTE — Telephone Encounter (Signed)
No 1/25 los. No changes made to pt's schedule.  

## 2020-02-12 ENCOUNTER — Other Ambulatory Visit: Payer: Self-pay

## 2020-02-12 MED ORDER — GABAPENTIN 100 MG PO CAPS: 100.0000 mg | ORAL_CAPSULE | Freq: Two times a day (BID) | ORAL | 1 refills | Status: DC

## 2020-02-12 NOTE — Progress Notes (Signed)
Medication review

## 2020-02-12 NOTE — Progress Notes (Signed)
Clarified medication instruction per pharmacy request

## 2020-02-13 ENCOUNTER — Other Ambulatory Visit: Payer: Self-pay | Admitting: Nurse Practitioner

## 2020-02-13 DIAGNOSIS — C50411 Malignant neoplasm of upper-outer quadrant of right female breast: Secondary | ICD-10-CM

## 2020-02-13 DIAGNOSIS — Z170 Estrogen receptor positive status [ER+]: Secondary | ICD-10-CM

## 2020-02-16 ENCOUNTER — Encounter: Payer: Self-pay | Admitting: *Deleted

## 2020-02-27 NOTE — Progress Notes (Signed)
[place]   [phone] [phone]   Clinic Follow up Note   Patient Care Team: [name], Lilia Argue, MD as PCP - General (Family Medicine) Mansouraty, Telford Nab., MD as Consulting Physician (Gastroenterology) Mauro Kaufmann, RN as Oncology Nurse Navigator Rockwell Germany, RN as Oncology Nurse Navigator Donnie Mesa, MD as Consulting Physician (General Surgery) Truitt Merle, MD as Consulting Physician (Hematology) Kyung Rudd, MD as Consulting Physician (Radiation Oncology) Contogiannis, Audrea Muscat, MD as Consulting Physician (Plastic Surgery)  Date of Service:  03/03/2020  CHIEF COMPLAINT: F/u of right breast cancer  SUMMARY OF ONCOLOGIC HISTORY: Oncology History Overview Note  Cancer Staging Malignant neoplasm of upper-outer quadrant of right breast in female, estrogen receptor positive ([place]) Staging form: Breast, AJCC 8th Edition - Clinical stage from 11/12/2019: Stage IIA (cT2, cN1, cM0, G2, ER+, PR+, HER2-) - Signed by Truitt Merle, MD on 11/19/2019    Malignant neoplasm of upper-outer quadrant of right breast in female, estrogen receptor positive ([place])  11/05/2019 Mammogram   IMPRESSION: Large irregular palpable mass 4.7 x 1.4 x 1.4 cm within the upper-outer right breast 11 o'clock position, 4 cm from nipple.  There is a large area of associated coarse heterogeneous calcifications within the mass. Overall findings are concerning for breast carcinoma.   Two cortically thickened right axillary lymph nodes which are indeterminate in etiology.   11/12/2019 Cancer Staging   Staging form: Breast, AJCC 8th Edition - Clinical stage from 11/12/2019: Stage IIA (cT2, cN1, cM0, G2, ER+, PR+, HER2-) - Signed by Truitt Merle, MD on 11/19/2019   11/12/2019 Initial Biopsy   Diagnosis 1. Breast, right, needle core biopsy, right - INVASIVE MAMMARY CARCINOMA, SEE COMMENT. - MAMMARY CARCINOMA IN SITU. 2. Lymph node, needle/core biopsy, right -  METASTATIC MAMMARY CARCINOMA. Microscopic Comment 1. The carcinoma appears grade 2 and measures 16 mm in greatest linear extent. E-cadherin will be ordered. Prognostic makers will be ordered. Dr. Saralyn Pilar has reviewed the case. The case was called to [facility] on 01/13/2020.    1. E-cadherin is strongly positive consistent with a ductal phenotype.   11/12/2019 Receptors her2   1. PROGNOSTIC INDICATORS Results: IMMUNOHISTOCHEMICAL AND MORPHOMETRIC ANALYSIS PERFORMED MANUALLY The tumor cells are NEGATIVE for Her2 (0). Estrogen Receptor: 100%, POSITIVE, STRONG STAINING INTENSITY Progesterone Receptor: 100%, POSITIVE, STRONG STAINING INTENSITY Proliferation Marker Ki67: 5%   11/17/2019 Initial Diagnosis   Malignant neoplasm of upper-outer quadrant of right breast in female, estrogen receptor positive ([place])   11/25/2019 Breast MRI   IMPRESSION: 1. Large area of non-mass enhancement involving the UPPER OUTER QUADRANT of the RIGHT breast measuring approximately 4.2 x 4.1 x 2.4 cm. The biopsy-proven IDC and DCIS is present at the superomedial aspect of this NME. 2. No MRI evidence of malignancy involving the LEFT breast. 3. Intact BILATERAL retropectoral implants. 4. 2 pathologically enlarged RIGHT axillary lymph nodes. One of these nodes is biopsy-proven metastatic disease. No pathologic lymphadenopathy elsewhere   11/25/2019 PET scan   IMPRESSION: 1. Two mildly enlarged hypermetabolic right axillary lymph nodes compatible with right axillary nodal metastases. 2. No additional sites of hypermetabolic metastatic disease. 3. Asymmetric indistinct upper right breast hypermetabolism, without discrete mass correlate on the CT images, compatible with known primary right breast malignancy.     11/26/2019 Genetic Testing   Negative genetic testing: no pathogenic variants detected in [facility] STAT Breast Cancer Panel.  Variant of uncertain significance (VUS) detected in  CHEK2 at c.1556G>T (p.Arg519Leu).  The report date  is November 26, 2019.   The STAT Breast cancer panel offered by [facility] includes sequencing and rearrangement analysis for the following 9 genes:  ATM, BRCA1, BRCA2, CDH1, CHEK2, PALB2, PTEN, STK11 and TP53.    Results of [facility] Multi-Cancer Panel are pending.    12/01/2019 -  Neo-Adjuvant Anti-estrogen oral therapy   Neoadjuvant Tamoxifen 62m on 12/01/19. Reduced to 152mon 12/09/19.       ---Zoladex injection monthly starting 12/09/19.       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  Variants of uncertain significance detected RAD51D at c.715C>T (p.Arg239Trp) and CHEK2 at c.1556G>T (p.Arg519Leu).  The report date is December 08, 2019.   The Multi-Cancer Panel offered by [facility] includes sequencing and/or deletion duplication testing of the following 85 genes: AIP, ALK, APC, ATM, AXIN2,BAP1,  BARD1, BLM, BMPR1A, BRCA1, BRCA2, BRIP1, CASR, CDC73, CDH1, CDK4, CDKN1B, CDKN1C, CDKN2A (p14ARF), CDKN2A (p16INK4a), CEBPA, CHEK2, CTNNA1, DICER1, DIS3L2, EGFR (c.2369C>T, p.Thr790Met variant only), EPCAM (Deletion/duplication testing only), FH, FLCN, GATA2, GPC3, GREM1 (Promoter region deletion/duplication testing only), HOXB13 (c.251G>A, p.Gly84Glu), HRAS, KIT, MAX, MEN1, MET, MITF (c.952G>A, p.Glu318Lys variant only), MLH1, MSH2, MSH3, MSH6, MUTYH, NBN, NF1, NF2, NTHL1, PALB2, PDGFRA, PHOX2B, PMS2, POLD1, POLE, POT1, PRKAR1A, PTCH1, PTEN, RAD50, RAD51C, RAD51D, RB1, RECQL4, RET, RNF43, RUNX1, SDHAF2, SDHA (sequence changes only), SDHB, SDHC, SDHD, SMAD4, SMARCA4, SMARCB1, SMARCE1, STK11, SUFU, TERC, TERT, TMEM127, TP53, TSC1, TSC2, VHL, WRN and WT1.    03/01/2020 Mammogram   Targeted ultrasound is performed, showing interval decreasing conspicuity of the patient's known right breast cancer. A  post biopsy clip with an associated irregular area shadowing is demonstrated at the 11 o'clock position 4 cm from the nipple. Exact measurements are difficult due to the vague appearance of the findings. It measures approximately 1.2 x 0.9 x 0.6 cm (previously 4.7 x 2.2 x 1.5 cm).   IMPRESSION: Imaging findings consistent with good response to chemotherapy.      CURRENT THERAPY:  Neoadjuvant Tamoxifen 2064mily startedon 12/01/19. Reduced to 56m65m 11/23/21due to poor tolerance. Zoladex injection monthly starting 12/09/19.Switched Tamoxifen to Anastrozole on 01/06/20. Verzenio started on 01/12/2020, dose reduced to 50mg54mand 100mg 30m/2022 due to poor tolerance   INTERVAL HISTORY:  Caitlyn Lessly Stiglerre for a follow up. She presents to the clinic with her husband. She notes she still has significant fatigue and increased gum bleeding. She notes mucus epithelial tissue output in her stool. She did not take a picture of this. She denies blood in stool. She notes her housework she does occasionally. She also has body aches and pains. She notes the hot flashes from treatment is manageable and able to sleep at night with medications. She notes she feels rushed to get her surgery and radiation. She notes she is tolerating anastrozole better than Tamoxifen.     REVIEW OF SYSTEMS:   Constitutional: Denies fevers, chills or abnormal weight loss (+) Fatigue (+) Hot flashes  Eyes: Denies blurriness of vision Ears, nose, mouth, throat, and face: Denies mucositis or sore throat (+) Gum bleeding  Respiratory: Denies cough, dyspnea or wheezes Cardiovascular: Denies palpitation, chest discomfort or lower extremity swelling Gastrointestinal:  Denies nausea, heartburn or change in bowel habits (+) Mucus output in stool  Skin: Denies abnormal skin rashes Lymphatics: Denies new lymphadenopathy or easy bruising Neurological:Denies numbness, tingling or new weaknesses Behavioral/Psych: Mood is  stable, no new changes  All other systems were reviewed with the patient and are negative.  MEDICAL HISTORY:  Past Medical History:  Diagnosis Date  . Abnormal Pap smear   . Acid reflux   . Anemia   . Anxiety   . Breast cancer ([place])   . Decreased appetite 10/08  . Depression   . Endometriosis 10/2004  . Epigastric pain 10/2006  . Family history of breast cancer 11/19/2019  . FH: migraines   . GBS carrier   . H/O amenorrhea 06/2006  . H/O dyspareunia 7/07  . H/O fatigue   . H/O nausea and vomiting 10/2006  . H/O rubella   . H/O varicella   . Hyperemesis arising during pregnancy    First pregnancy  . Irregular periods/menstrual cycles 02/2005  . 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 Pelvic pain 09/2008    SURGICAL HISTORY: Past Surgical History:  Procedure Laterality Date  . AUGMENTATION MAMMAPLASTY Bilateral 0623   silicone   . WISDOM TOOTH EXTRACTION      I have reviewed the social history and family history with the patient and they are unchanged from previous note.  ALLERGIES:  is allergic to ibuprofen.  MEDICATIONS:  Current Outpatient Medications  Medication Sig Dispense Refill  . abemaciclib (VERZENIO) 50 MG tablet Take 1 tablet (50 mg) in the morning and 2 tablets (100 mg) in the evening. Swallow tablets whole. Do not chew, crush, or split tablets before swallowing. 90 tablet 1  . anastrozole (ARIMIDEX) 1 MG tablet Take 1 tablet (1 mg total) by mouth daily. 30 tablet 3  . clindamycin (CLINDAGEL) 1 % gel Apply topically 2 (two) times daily. 30 g 0  . gabapentin (NEURONTIN) 100 MG capsule Take 2 capsules (200 mg total) by mouth at bedtime. 60 capsule 1  . ondansetron (ZOFRAN ODT) 8 MG disintegrating tablet Take 1 tablet (8 mg total) by mouth every 8 (eight) hours as needed for nausea or vomiting. 30 tablet 2  . pantoprazole (PROTONIX) 40 MG tablet Take 1 tablet (40 mg total) by mouth daily as needed. 30 tablet 5  . venlafaxine (EFFEXOR) 37.5 MG tablet  TAKE 1 TABLET(37.5 MG) BY MOUTH TWICE DAILY 30 tablet 4   No current facility-administered medications for this visit.    PHYSICAL EXAMINATION: ECOG PERFORMANCE STATUS: 2 - Symptomatic, <50% confined to bed  Vitals:   03/03/20 1302  BP: 104/71  Pulse: 81  Resp: 13  Temp: 98.2 F (36.8 C)  SpO2: 98%   Filed Weights   03/03/20 1302  Weight: 124 lb 9.6 oz (56.5 kg)    Due to COVID19 we will limit examination to appearance. Patient had no complaints.  GENERAL:alert, no distress and comfortable SKIN: skin color normal, no rashes or significant lesions EYES: normal, Conjunctiva are pink and non-injected, sclera clear  NEURO: alert & oriented x 3 with fluent speech   LABORATORY DATA:  I have reviewed the data as listed CBC Latest Ref Rng & Units 03/03/2020 01/27/2020 01/06/2020  WBC 4.0 - 10.5 K/uL 5.8 5.7 7.1  Hemoglobin 12.0 - 15.0 g/dL 12.6 12.2 12.6  Hematocrit 36.0 - 46.0 % 37.7 36.9 37.7  Platelets 150 - 400 K/uL 261 209 237     CMP Latest Ref Rng & Units 03/03/2020 01/27/2020 01/06/2020  Glucose 70 - 99 mg/dL 97 94 88  BUN 6 - 20 mg/dL _0 Creatinine 0.44 - 1.00 mg/dL 0.89 0.82 0.73  Sodium 135 - 145 mmol/L 141 140 140  Potassium 3.5 -  5.1 mmol/L 3.9 4.5 3.9  Chloride 98 - 111 mmol/L 106 107 109  CO2 22 - 32 mmol/L _0 Calcium 8.9 - 10.3 mg/dL 9.3 9.2 9.1  Total Protein 6.5 - 8.1 g/dL 7.7 7.1 7.6  Total Bilirubin 0.3 - 1.2 mg/dL 0.6 0.4 0.4  Alkaline Phos 38 - 126 U/L 40 34(L) 30(L)  AST 15 - 41 U/L _1 ALT 0 - 44 U/L _2 RADIOGRAPHIC STUDIES: I have personally reviewed the radiological images as listed and agreed with the findings in the report. No results found.   ASSESSMENT & PLAN:  Tarea Skillman is a 35 y.o. female with   1.Malignant neoplasm of upper-outer quadrant of right breast, StageIIA, c(T2N1M0), ER+/PR+/HER2-, Levester Fresh, Mammaprintluminal type A, low risk -She has a 4.7cm right breast mass at 11:00 position andtwo  abnormalright axillary LN. Her10/27/21biopsy shows grade II invasive ductal carcinomawithmetastasisto her LN, ER/PRstrongly positive, HER2 negative, low Ki67.  -Her 11/2019 breast MRI and PET scan did not indicate left breast or distant malignancy or metastasis. -due to her large tumor, she needsmastectomy if she proceeded with surgery first. She has been seen by Dr. Georgette Dover.Her mastectomy will be complicated by history of implant placement. -Mammaprint showed low risk disease, she would unlikely benefit from chemotherapy, and neoadjuvant chemotherapy is not recommended. -I have started her on neoadjuvant tamoxifen on 12/01/19. She tolerated poorly with nausea, and diffuse body aches. Dose reduced to 82m from 12/09/19.I started her on monthly Zoladex injection on 12/09/19. Then switched Tamoxifen to Anastrozole on 01/06/20. -She is not able to tolerate a full dose Verzenio, performance status regular due to severe fatigue. I reduced her dose to 50 mg in the morning and 100 mg at night on 02/10/20 and she is tolerating better.  -We [place] which showed her right breast mass now measures approximately 1.2 x 0.9 x 0.6 cm (previously 4.7 x 2.2 x 1.5 cm). Nodes appear to be normal now, She overall has had great response to treatment. I personally reviewed with patient today.  -I also discussed that her breast MRI showed additional non-mass enhancement in her right breast around the know tumor. This was not biopsied at that time. I recommend repeating MRI given good response to treatment.  -She will f/u with Dr TGeorgette Doverafter her breast MRI to discuss her surgical options. She is agreeable. She strongly prefers to have lumpectomy, and is concerned about risk of lymphedema after nodes removal, we discussed that lymphedema risk is not very high since she may not need axillary lymph node dissection -Labs reviewed, CBC and CMP WNL. Continue anastrozole and Verzenio  at same dose. She will stop Verzenio 1-2 weeks before surgery and can continue anastrozole through her surgery.  -Based on her final surgical path, may continue same regimen or just continue anastrozole after surgery.  -F/u in 4 weeks    2.GeneralizedWeakness, body pain, Fatigue  -Pt notes for the past 3 years she has had mid chest and mid back pain. Previously intermittent, now constant. She also notes b/l arm and leg weakness and pain along with tingling of her toes and fingers.  -This has lead to fatigue, low appetite, decreased daily function/activity.  -[place]. They said this is related to stress, anxiety or depression. Pt feels she has an illness causing this instead.  -Her 11/25/19 PET was  negative for any distant malignancy or metastasis.  -Symptoms of fatigue, gum bleeding and body pain and now recently mucus output with stool. Will continue to monitor before and after breast surgery (03/03/20)  3. Anxiety, depression  -Pt notes feeling more anxious (due to health before breast cancer), than depressed.  -She was on Lexapro,  I switched her to  Effexor, she is tolerating better   4. Genetic Testingnegative for pathogenetic mutationsin RAD51D, andVariant of uncertain significance (VUS) detected inCHEK2at c.1556G>T (p.Arg519Leu). -She has discussed with our genetic consoler   5. Social Support  -She is from [place] and lives both there and in [place] with her husband. She has 2 teenage children. The rest of her family is in [place].  6. Scalp Lesion -She notes recent bleeding and pain of her [place] of scalp. -She will consult with [facility] dermatology. I previously referred her.  -Stable.    PLAN: -I refilled anastrozole and Verzenio today  -Proceed with Zoladex injection today and continue every 4 weeks.  -continue Anastrozole. -Continue Verzenio at 50 mg in the morning  and 100 mg at the evening. Stop 1-2 weeks before surgery . -MRI breast in 1-2 weeks. Will consult with Dr Georgette Dover after scan.  -Lab, f/u, Zoladex in 4 weeks    No problem-specific Assessment & Plan notes found for this encounter.   Orders Placed This Encounter  Procedures  . MR BREAST BILATERAL W WO CONTRAST INC CAD    Standing Status:   Future    Standing Expiration Date:   03/03/2021    Order Specific Question:   If indicated for the ordered procedure, I authorize the administration of contrast media per Radiology protocol    Answer:   Yes    Order Specific Question:   What is the patient's sedation requirement?    Answer:   No Sedation    Order Specific Question:   Does the patient have a pacemaker or implanted devices?    Answer:   No    Order Specific Question:   Radiology Contrast Protocol - do NOT remove file path    Answer:   [email or web address]    Order Specific Question:   Preferred imaging location?    Answer:   [facility] (table limit - 550 lbs)   All questions were answered. The patient knows to call the clinic with any problems, questions or concerns. No barriers to learning was detected. The total time spent in the appointment was 30 minutes.     Truitt Merle, MD 03/03/2020   I, Joslyn Devon, am acting as scribe for Truitt Merle, MD.   I have reviewed the above documentation for accuracy and completeness, and I agree with the above.

## 2020-03-01 ENCOUNTER — Ambulatory Visit
Admission: RE | Admit: 2020-03-01 | Discharge: 2020-03-01 | Disposition: A | Discharge status: Home / Self Care | Payer: 59 | Source: Ambulatory Visit | Attending: Hematology | Admitting: Hematology

## 2020-03-01 ENCOUNTER — Other Ambulatory Visit: Payer: Self-pay | Admitting: Hematology

## 2020-03-01 ENCOUNTER — Other Ambulatory Visit: Payer: Self-pay | Admitting: Nurse Practitioner

## 2020-03-01 ENCOUNTER — Encounter: Payer: Self-pay | Admitting: *Deleted

## 2020-03-01 ENCOUNTER — Other Ambulatory Visit: Payer: Self-pay

## 2020-03-01 DIAGNOSIS — C50411 Malignant neoplasm of upper-outer quadrant of right female breast: Secondary | ICD-10-CM

## 2020-03-01 DIAGNOSIS — Z170 Estrogen receptor positive status [ER+]: Secondary | ICD-10-CM

## 2020-03-01 IMAGING — US US BREAST*R* LIMITED INC AXILLA
1 series · 13 of 15 positions shown · non-contrast
Comparison: Previous exam(s).

CLINICAL DATA: 34-year-old female with known right breast cancer
and axillary metastases presents for re-evaluation after neoadjuvant
chemotherapy.

EXAM:
ULTRASOUND RIGHT BREAST LIMITED; DIGITAL DIAGNOSTIC UNILATERAL RIGHT
MAMMOGRAM WITH TOMOSYNTHESIS AND CAD
TECHNIQUE: Targeted ultrasound examination of the right breast was performed.;
Right digital diagnostic mammography and breast tomosynthesis was
performed. The images were evaluated with computer-aided detection.

[Series 1: us breast*right* limited inc axilla · 0.05mm/px · 13 of 15 slices shown]
[im 1/15]
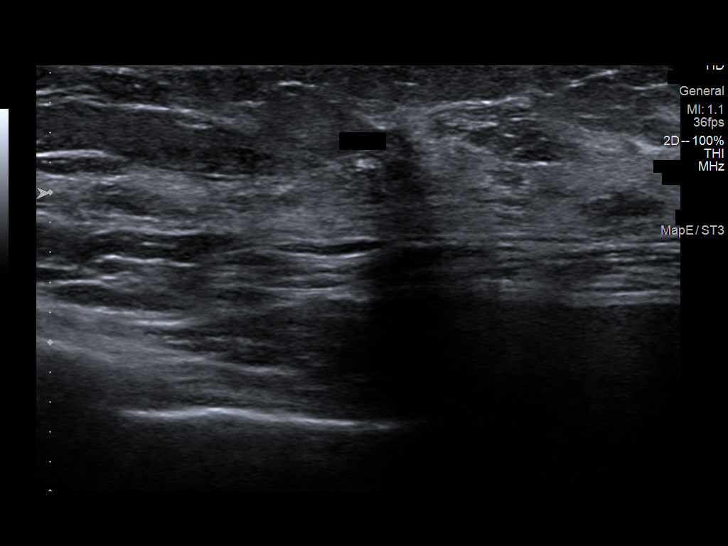
[im 2/15]
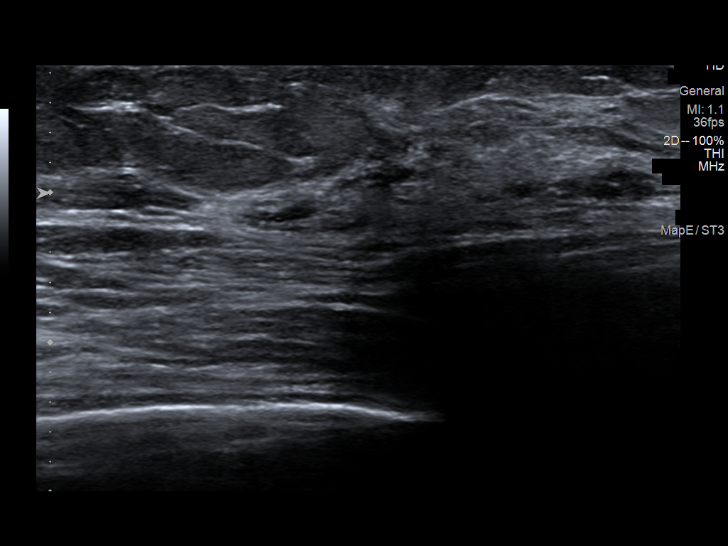
[im 3/15]
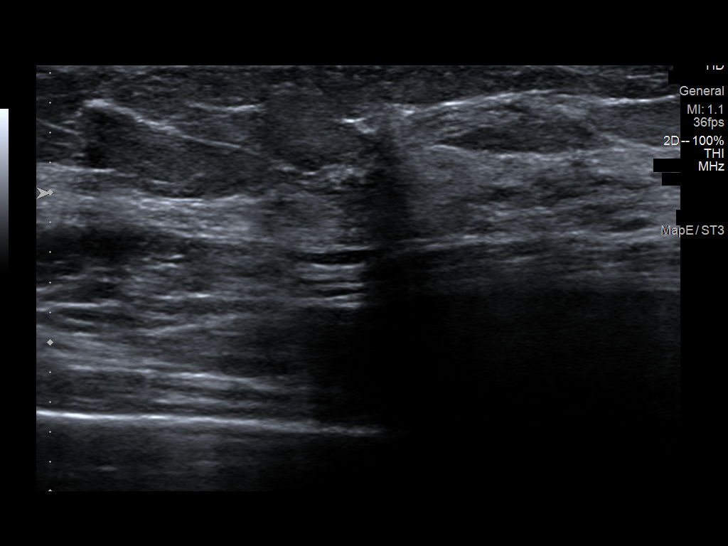
[im 5/15]
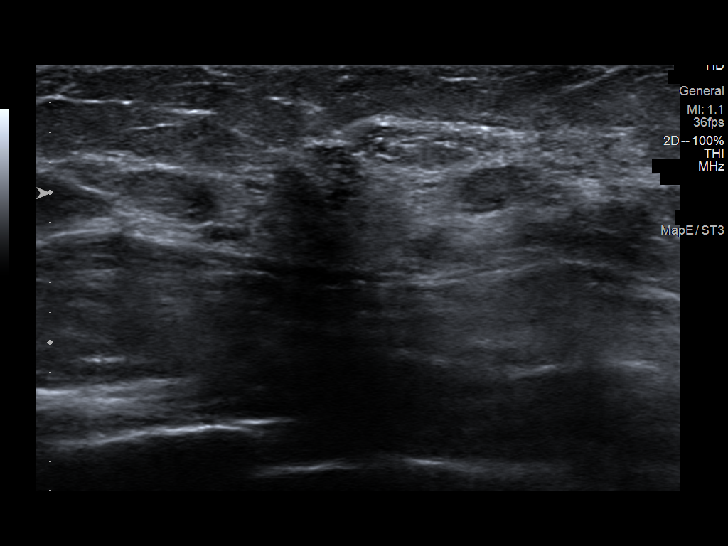
[im 6/15]
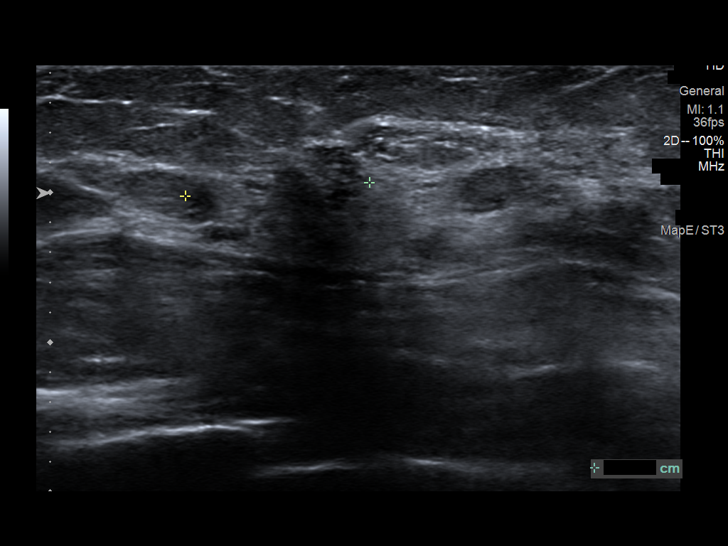
[im 7/15]
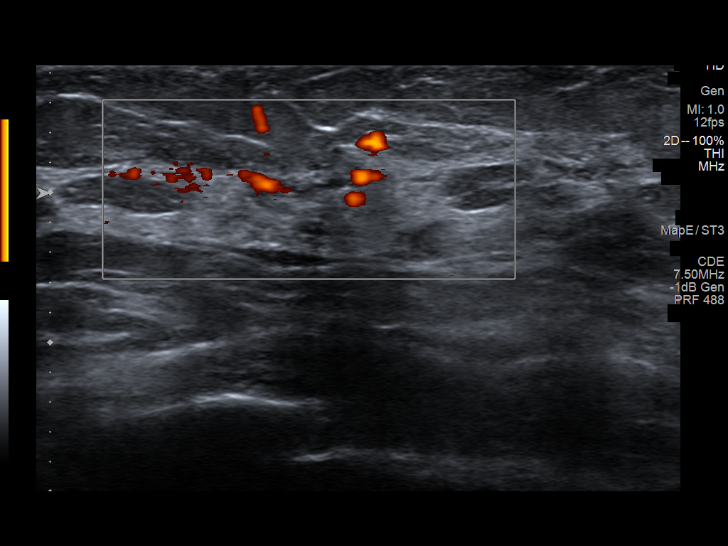
[im 8/15]
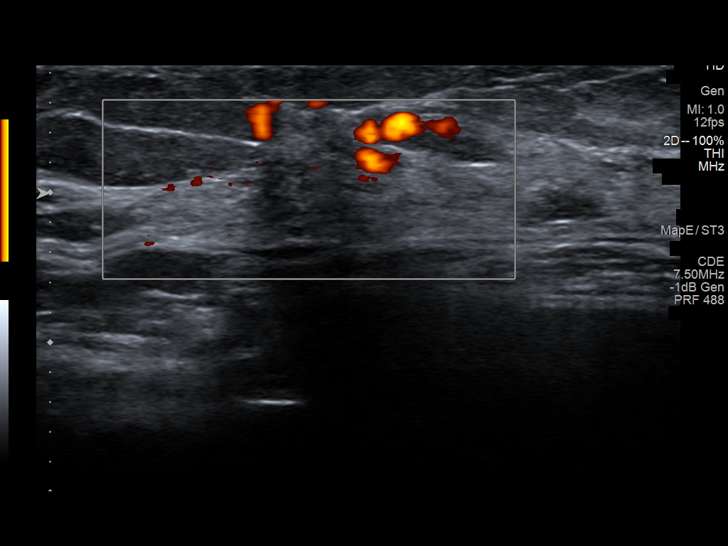
[im 9/15]
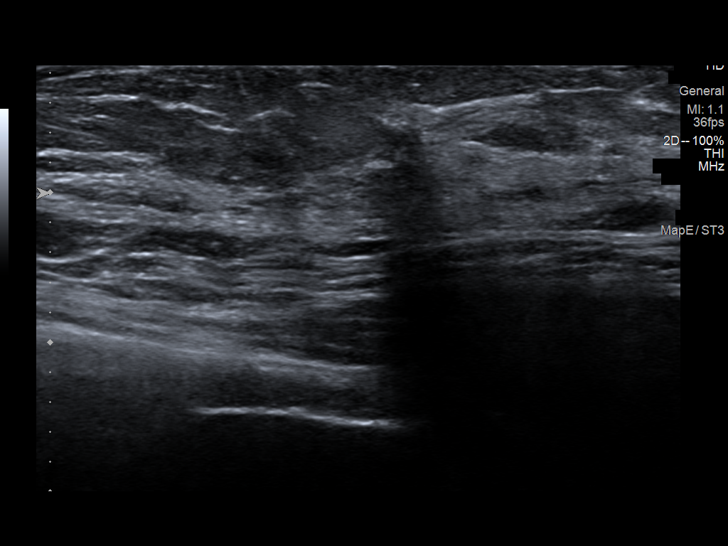
[im 10/15]
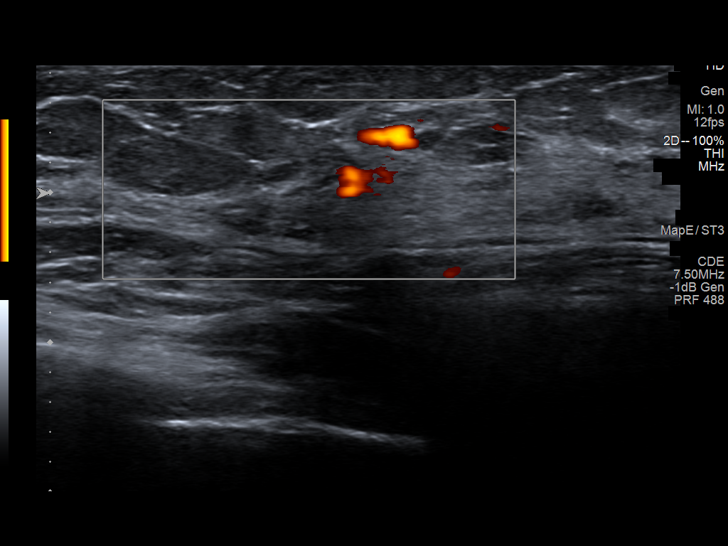
[im 11/15]
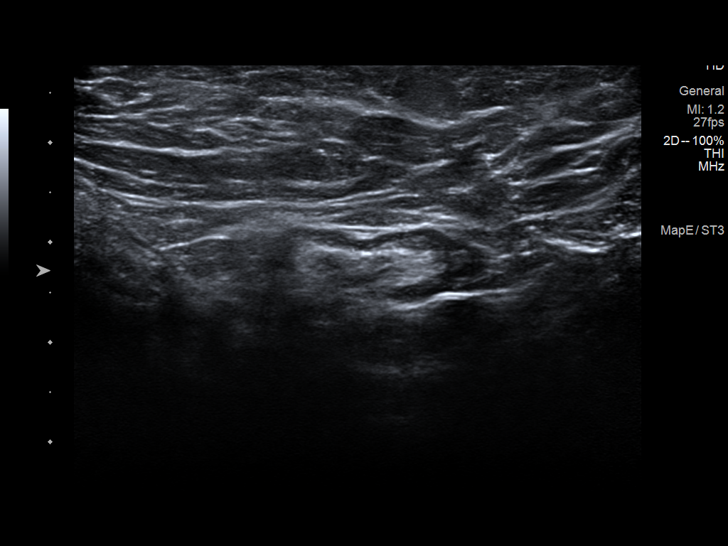
[im 13/15]
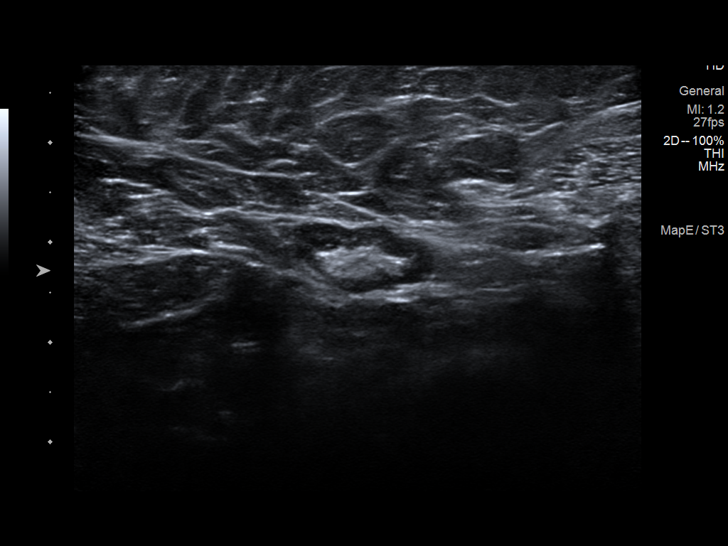
[im 14/15]
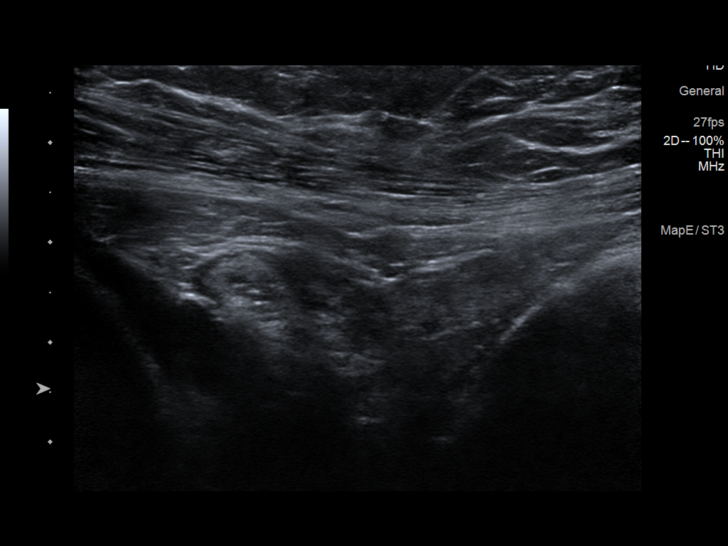
[im 15/15]
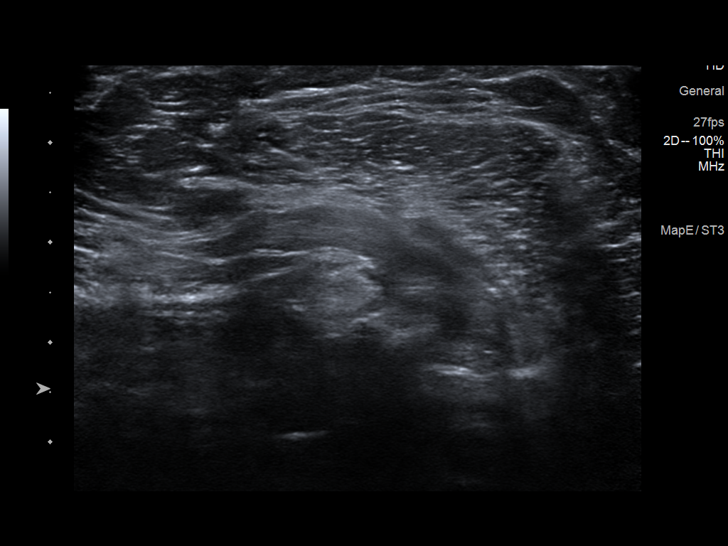

[13 of 15 positions shown; findings below may reference images not displayed]

ACR Breast Density Category c: The breast tissue is heterogeneously
dense, which may obscure small masses.
FINDINGS: Implant displaced mammographic views again demonstrate a spiculated
mass and associated distortion in the far posterior upper outer
right breast with associated calcifications. No enlargement or
extension of calcifications on today's mammographic views. No
suspicious findings in the remainder of the right breast.

The patient has retropectoral implants.

Targeted ultrasound is performed, showing interval decreasing
conspicuity of the patient's known right breast cancer. A post
biopsy clip with an associated irregular area shadowing is
demonstrated at the 11 o'clock position 4 cm from the nipple. Exact
measurements are difficult due to the vague appearance of the
findings. It measures approximately 1.2 x 0.9 x 0.6 cm (previously
4.7 x 2.2 x 1.5 cm).
IMPRESSION: Imaging findings consistent with good response to chemotherapy.

RECOMMENDATION:
Per clinical treatment plan.

I have discussed the findings and recommendations with the patient.
If applicable, a reminder letter will be sent to the patient
regarding the next appointment.

BI-RADS CATEGORY  6: Known biopsy-proven malignancy.

## 2020-03-01 IMAGING — MG MM DIGITAL DIAGNOSTIC UNILAT*R* W/ TOMO W/ CAD
4 series · 4 of 12 positions shown · non-contrast
Comparison: Previous exam(s).

CLINICAL DATA: 34-year-old female with known right breast cancer
and axillary metastases presents for re-evaluation after neoadjuvant
chemotherapy.

EXAM:
ULTRASOUND RIGHT BREAST LIMITED; DIGITAL DIAGNOSTIC UNILATERAL RIGHT
MAMMOGRAM WITH TOMOSYNTHESIS AND CAD
TECHNIQUE: Targeted ultrasound examination of the right breast was performed.;
Right digital diagnostic mammography and breast tomosynthesis was
performed. The images were evaluated with computer-aided detection.

[R CC synth-2D]
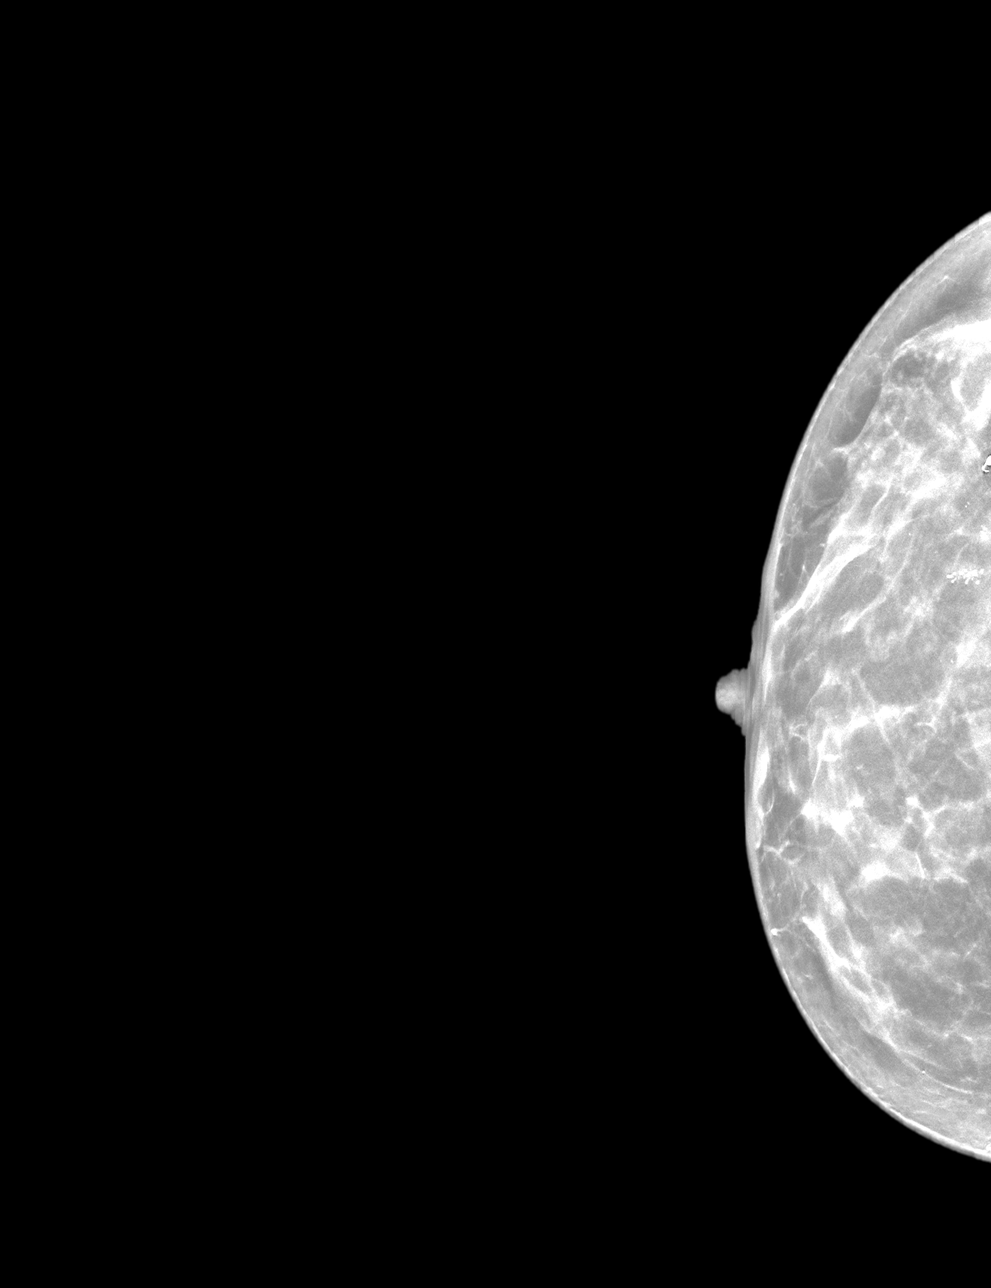

[R ML synth-2D]
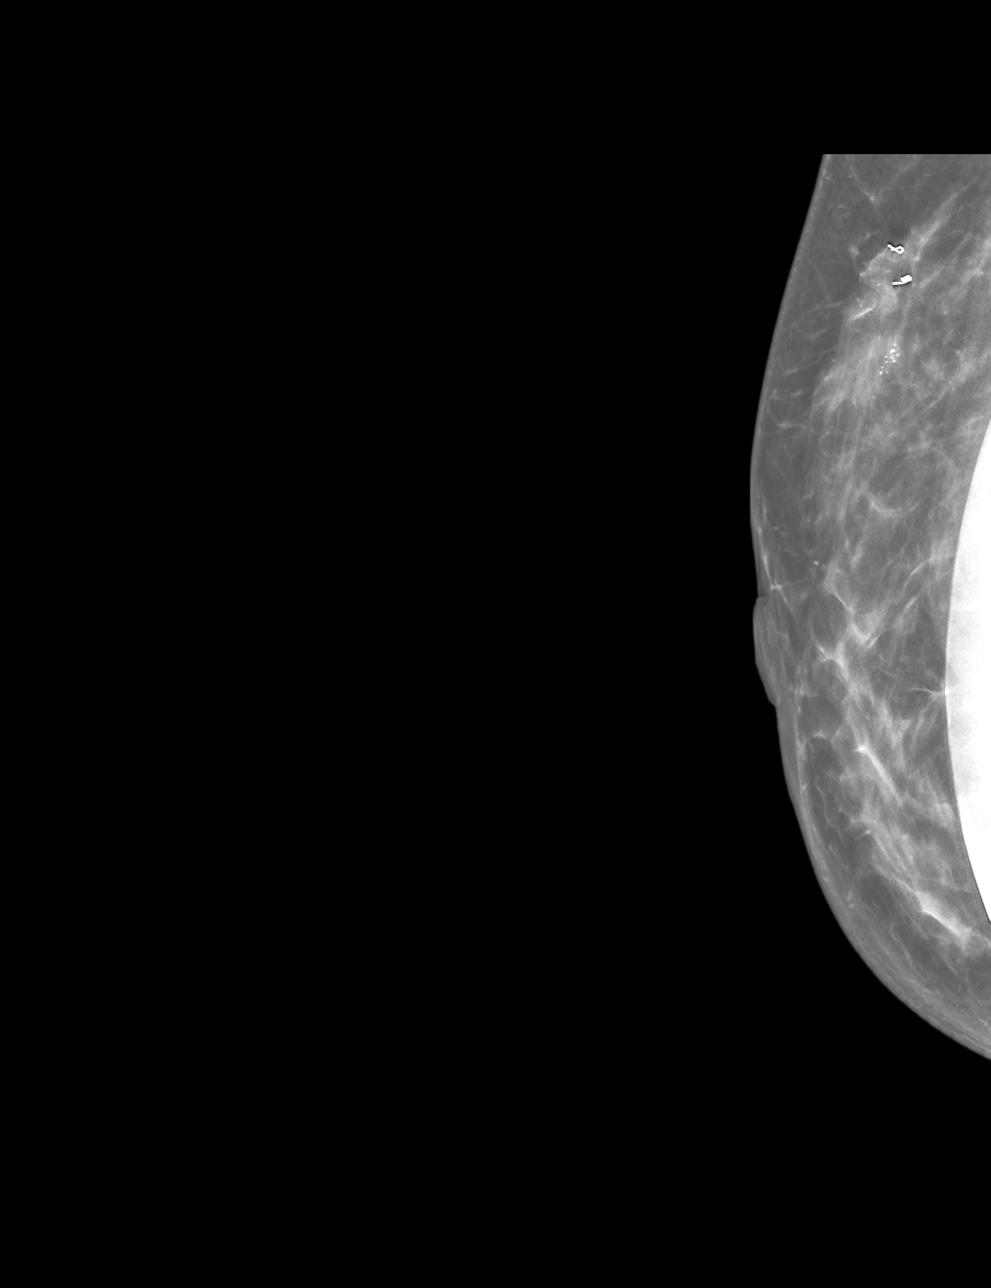

[R ML tomo · tomo slice 29/58.0]
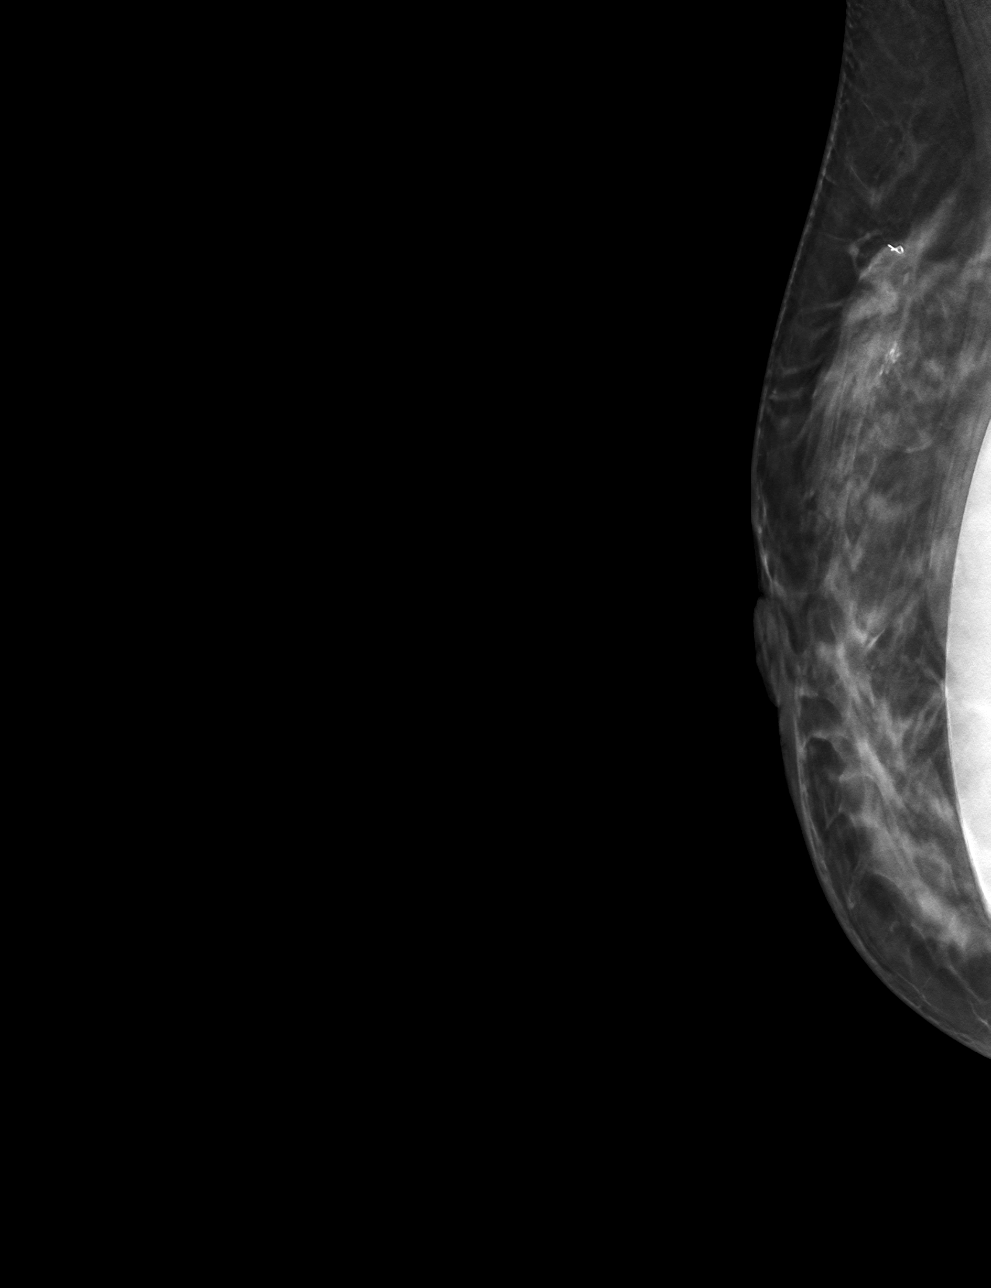

[R CC tomo · tomo slice 25/48.0]
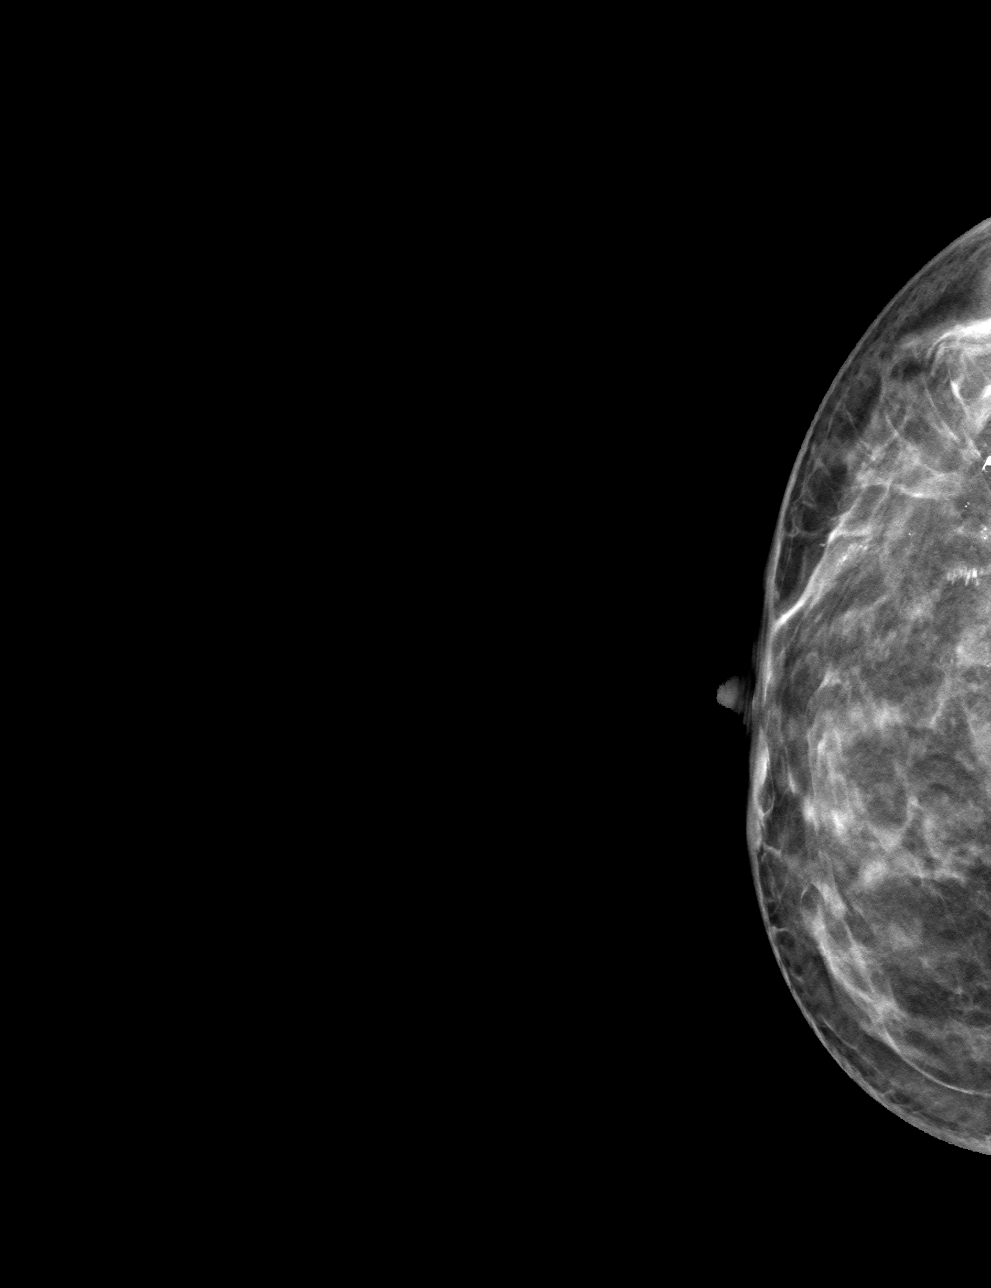

[4 of 12 positions shown; findings below may reference images not displayed]

ACR Breast Density Category c: The breast tissue is heterogeneously
dense, which may obscure small masses.
FINDINGS: Implant displaced mammographic views again demonstrate a spiculated
mass and associated distortion in the far posterior upper outer
right breast with associated calcifications. No enlargement or
extension of calcifications on today's mammographic views. No
suspicious findings in the remainder of the right breast.

The patient has retropectoral implants.

Targeted ultrasound is performed, showing interval decreasing
conspicuity of the patient's known right breast cancer. A post
biopsy clip with an associated irregular area shadowing is
demonstrated at the 11 o'clock position 4 cm from the nipple. Exact
measurements are difficult due to the vague appearance of the
findings. It measures approximately 1.2 x 0.9 x 0.6 cm (previously
4.7 x 2.2 x 1.5 cm).
IMPRESSION: Imaging findings consistent with good response to chemotherapy.

RECOMMENDATION:
Per clinical treatment plan.

I have discussed the findings and recommendations with the patient.
If applicable, a reminder letter will be sent to the patient
regarding the next appointment.

BI-RADS CATEGORY  6: Known biopsy-proven malignancy.

## 2020-03-03 ENCOUNTER — Other Ambulatory Visit: Payer: Self-pay | Admitting: Nurse Practitioner

## 2020-03-03 ENCOUNTER — Telehealth: Payer: Self-pay | Admitting: Hematology

## 2020-03-03 ENCOUNTER — Inpatient Hospital Stay: Payer: 59 | Admitting: Hematology

## 2020-03-03 ENCOUNTER — Encounter: Payer: Self-pay | Admitting: Hematology

## 2020-03-03 ENCOUNTER — Inpatient Hospital Stay: Payer: 59 | Attending: Hematology

## 2020-03-03 ENCOUNTER — Inpatient Hospital Stay: Payer: 59

## 2020-03-03 ENCOUNTER — Other Ambulatory Visit: Payer: Self-pay

## 2020-03-03 DIAGNOSIS — F32A Depression, unspecified: Secondary | ICD-10-CM | POA: Insufficient documentation

## 2020-03-03 DIAGNOSIS — Z170 Estrogen receptor positive status [ER+]: Secondary | ICD-10-CM

## 2020-03-03 DIAGNOSIS — C50411 Malignant neoplasm of upper-outer quadrant of right female breast: Secondary | ICD-10-CM

## 2020-03-03 DIAGNOSIS — F419 Anxiety disorder, unspecified: Secondary | ICD-10-CM | POA: Diagnosis not present

## 2020-03-03 DIAGNOSIS — Z79811 Long term (current) use of aromatase inhibitors: Secondary | ICD-10-CM | POA: Insufficient documentation

## 2020-03-03 DIAGNOSIS — Z5111 Encounter for antineoplastic chemotherapy: Secondary | ICD-10-CM | POA: Diagnosis present

## 2020-03-03 LAB — CBC WITH DIFFERENTIAL (CANCER CENTER ONLY)
Abs Immature Granulocytes: 0.01 10*3/uL (ref 0.00–0.07)
Basophils Absolute: 0.1 10*3/uL (ref 0.0–0.1)
Basophils Relative: 1 %
Eosinophils Absolute: 0.1 10*3/uL (ref 0.0–0.5)
Eosinophils Relative: 1 %
HCT: 37.7 % (ref 36.0–46.0)
Hemoglobin: 12.6 g/dL (ref 12.0–15.0)
Immature Granulocytes: 0 %
Lymphocytes Relative: 59 %
Lymphs Abs: 3.4 10*3/uL (ref 0.7–4.0)
MCH: 31.3 pg (ref 26.0–34.0)
MCHC: 33.4 g/dL (ref 30.0–36.0)
MCV: 93.5 fL (ref 80.0–100.0)
Monocytes Absolute: 0.4 10*3/uL (ref 0.1–1.0)
Monocytes Relative: 6 %
Neutro Abs: 1.9 10*3/uL (ref 1.7–7.7)
Neutrophils Relative %: 33 %
Platelet Count: 261 10*3/uL (ref 150–400)
RBC: 4.03 MIL/uL (ref 3.87–5.11)
RDW: 13.4 % (ref 11.5–15.5)
WBC Count: 5.8 10*3/uL (ref 4.0–10.5)
nRBC: 0 % (ref 0.0–0.2)

## 2020-03-03 LAB — CMP (CANCER CENTER ONLY)
ALT: 14 U/L (ref 0–44)
AST: 17 U/L (ref 15–41)
Albumin: 4.3 g/dL (ref 3.5–5.0)
Alkaline Phosphatase: 40 U/L (ref 38–126)
Anion gap: 9 (ref 5–15)
BUN: 8 mg/dL (ref 6–20)
CO2: 26 mmol/L (ref 22–32)
Calcium: 9.3 mg/dL (ref 8.9–10.3)
Chloride: 106 mmol/L (ref 98–111)
Creatinine: 0.89 mg/dL (ref 0.44–1.00)
GFR, Estimated: >60 mL/min (ref >60)
Glucose, Bld: 97 mg/dL (ref 70–99)
Potassium: 3.9 mmol/L (ref 3.5–5.1)
Sodium: 141 mmol/L (ref 135–145)
Total Bilirubin: 0.6 mg/dL (ref 0.3–1.2)
Total Protein: 7.7 g/dL (ref 6.5–8.1)

## 2020-03-03 MED ORDER — GOSERELIN ACETATE 3.6 MG ~~LOC~~ IMPL
3.6000 mg | DRUG_IMPLANT | Freq: Once | SUBCUTANEOUS | Status: AC
  Administered 2020-03-03: 3.6 mg via SUBCUTANEOUS

## 2020-03-03 MED ORDER — ABEMACICLIB 50 MG PO TABS: ORAL_TABLET | ORAL | 1 refills | Status: DC

## 2020-03-03 MED ORDER — GOSERELIN ACETATE 3.6 MG ~~LOC~~ IMPL
DRUG_IMPLANT | SUBCUTANEOUS | Status: AC
  Filled 2020-03-03: qty 3.6

## 2020-03-03 MED ORDER — GABAPENTIN 100 MG PO CAPS: 200.0000 mg | ORAL_CAPSULE | Freq: Every day | ORAL | 1 refills | Status: DC

## 2020-03-03 NOTE — Telephone Encounter (Signed)
Scheduled follow-up appointment per 2/16 los. Patient is aware. °

## 2020-03-03 NOTE — Patient Instructions (Signed)

## 2020-03-04 ENCOUNTER — Encounter: Payer: Self-pay | Admitting: *Deleted

## 2020-03-10 MED FILL — ANASTROZOLE 1 MG TABLET: 1 | 30 days supply | Qty: 30 | Fill #2

## 2020-03-13 ENCOUNTER — Other Ambulatory Visit: Payer: Self-pay | Admitting: Hematology

## 2020-03-16 ENCOUNTER — Ambulatory Visit (HOSPITAL_COMMUNITY)
Admission: RE | Admit: 2020-03-16 | Discharge: 2020-03-16 | Disposition: A | Discharge status: Home / Self Care | Payer: 59 | Source: Ambulatory Visit | Attending: Hematology | Admitting: Hematology

## 2020-03-16 ENCOUNTER — Other Ambulatory Visit: Payer: Self-pay

## 2020-03-16 DIAGNOSIS — Z170 Estrogen receptor positive status [ER+]: Secondary | ICD-10-CM | POA: Diagnosis present

## 2020-03-16 DIAGNOSIS — C50411 Malignant neoplasm of upper-outer quadrant of right female breast: Secondary | ICD-10-CM | POA: Diagnosis not present

## 2020-03-16 IMAGING — MR MR BREAST BILAT WO/W CM
8 of 13 series · 29 of 48 positions shown · IV contrast (gadavist)
Comparison: [DATE] MRI, [DATE] and earlier spot studies.

CLINICAL DATA: Breast cancer. Assess treatment response. Patient
initially presented with a palpable lump in the UPPER-OUTER QUADRANT
of the RIGHT breast. Biopsy showed grade 2 invasive ductal carcinoma
with ductal carcinoma in situ and metastatic disease to RIGHT
axillary lymph node.

LABS:  None obtained at the time of imaging.
EXAM:
BILATERAL BREAST MRI WITH AND WITHOUT CONTRAST
TECHNIQUE: Multiplanar, multisequence MR images of both breasts were obtained
prior to and following the intravenous administration of 5 ml of
Gadavist

[Series 5: T2 · axial · 3.0mm · 0.83mm/px · z∈[-81,+108]mm · 2 of 64 slices shown]
[im 1/64]
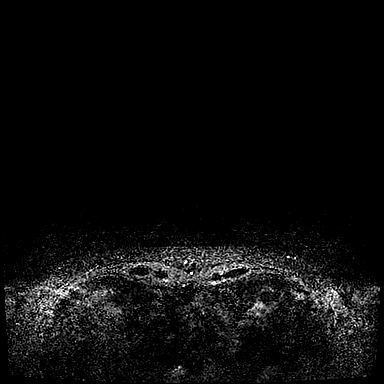
[im 64/64]
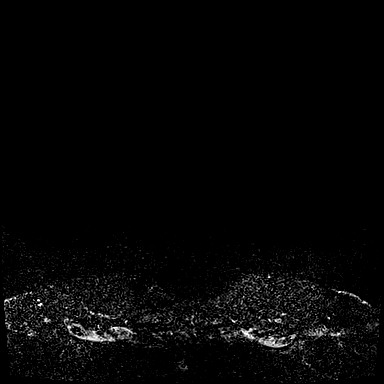

[Series 6: T1 fat-sat · axial · 1.2mm · 0.71mm/px · z∈[-82,+109]mm · 7 of 160 slices shown (1 of 4)]
[im 1/160]
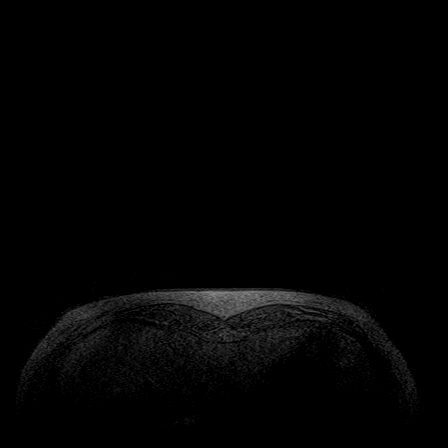
[im 27/160]
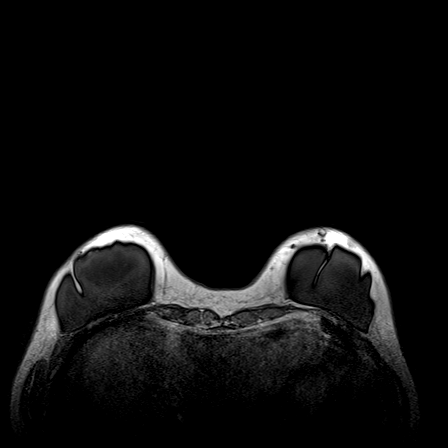
[im 54/160]
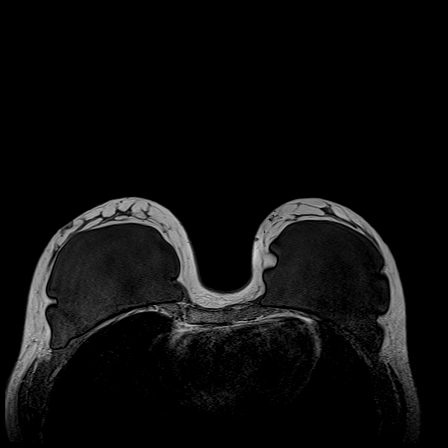
[im 80/160]
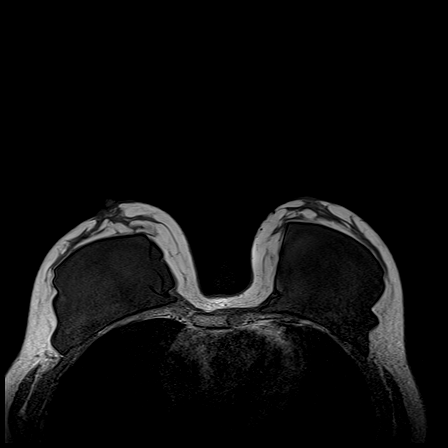
[im 107/160]
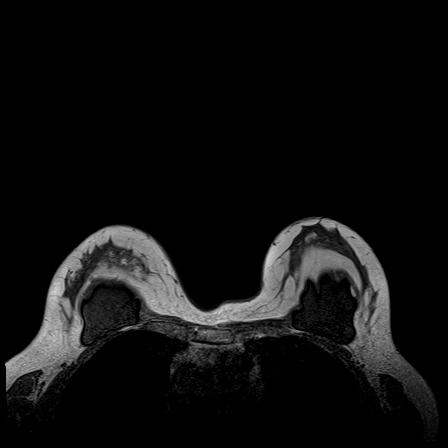
[im 133/160]
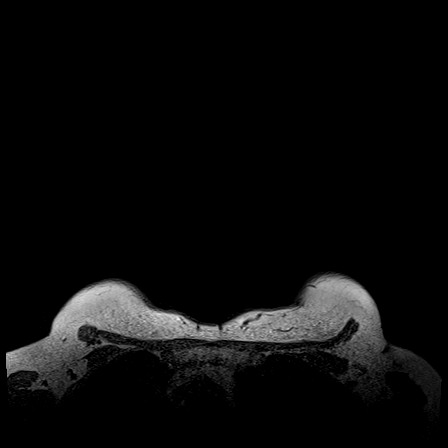
[im 160/160]
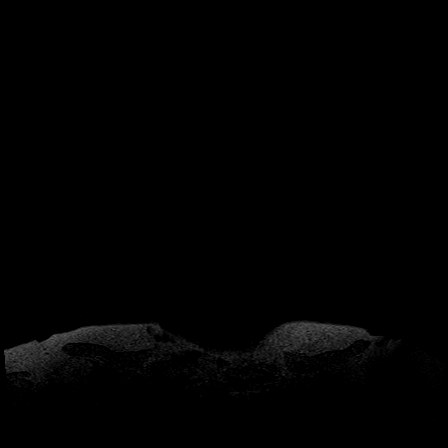

[Series 8: T1 fat-sat · axial · 1.6mm · 0.77mm/px · z∈[-88,+102]mm · 5 of 120 slices shown (2 of 4)]
[im 1/120]
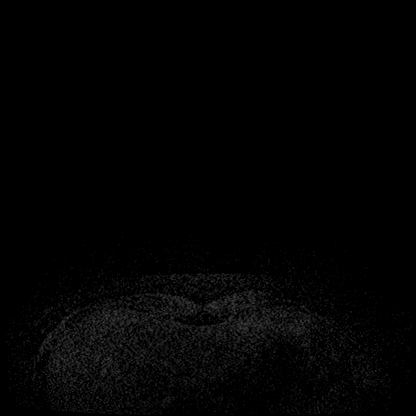
[im 30/120]
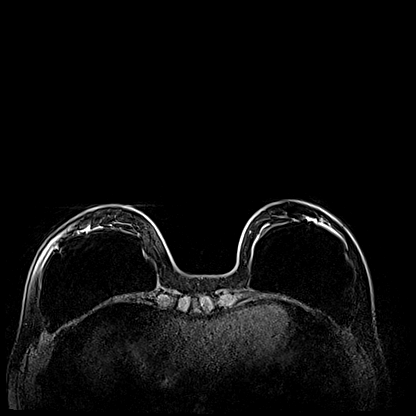
[im 60/120]
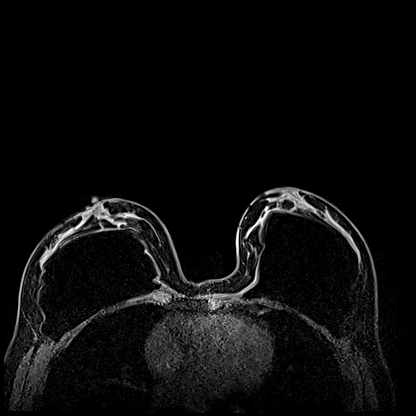
[im 90/120]
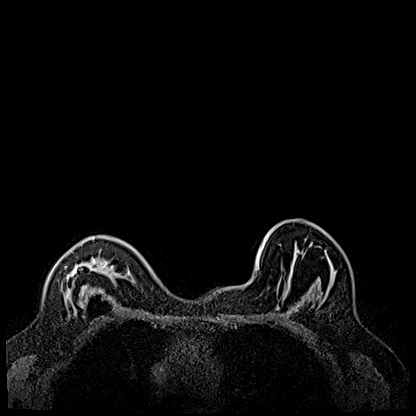
[im 120/120]
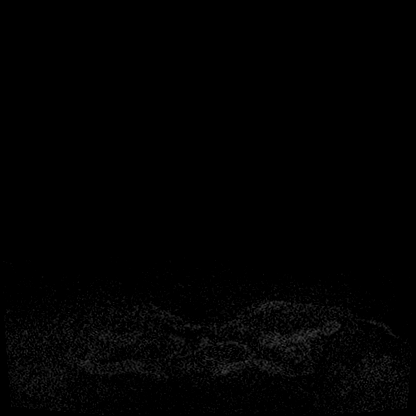

[Series 9: T1 fat-sat · axial · 1.6mm · 0.77mm/px · z∈[-88,+102]mm · 5 of 120 slices shown (3 of 4)]
[im 1/120]
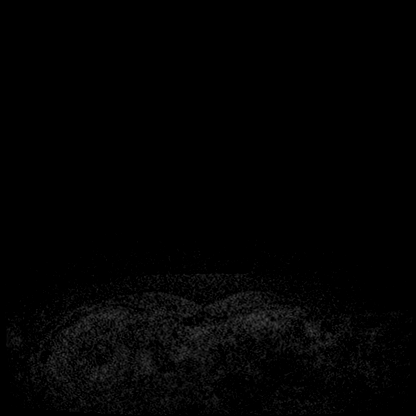
[im 30/120]
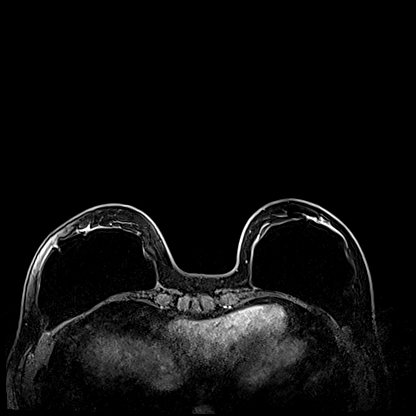
[im 60/120]
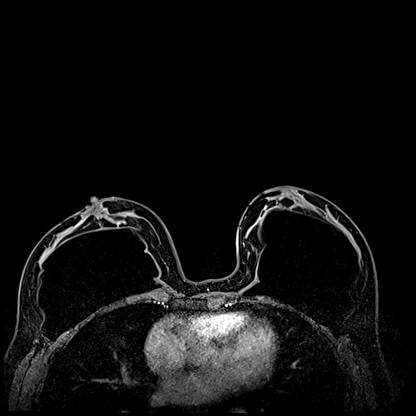
[im 90/120]
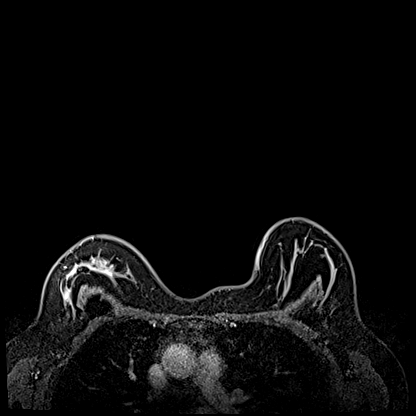
[im 120/120]
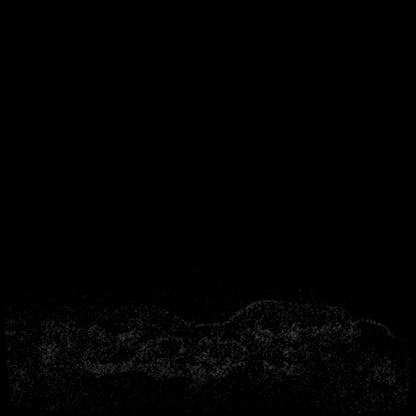

[Series 10: T1 · axial · 1.6mm · 0.77mm/px · z∈[-88,+102]mm · 5 of 120 slices shown (1 of 3)]
[im 1/120]
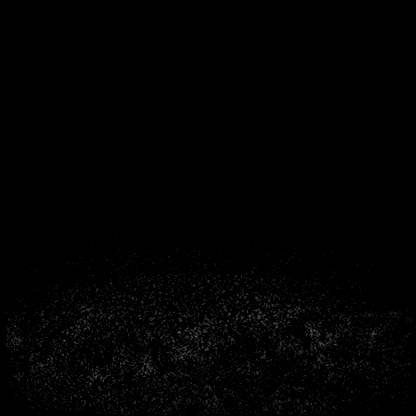
[im 30/120]
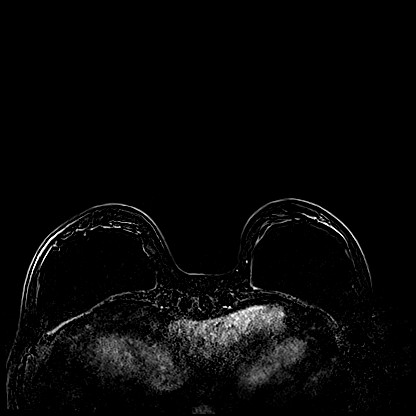
[im 60/120]
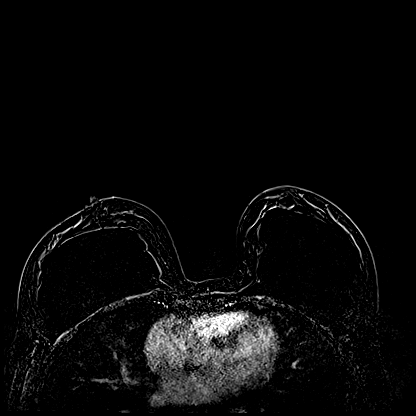
[im 90/120]
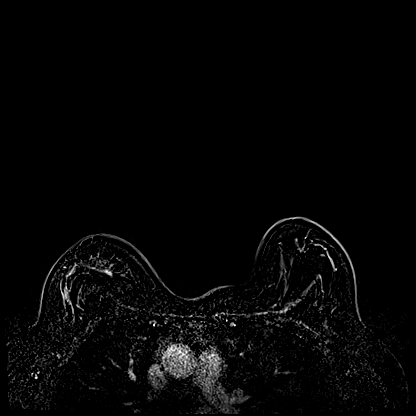
[im 120/120]
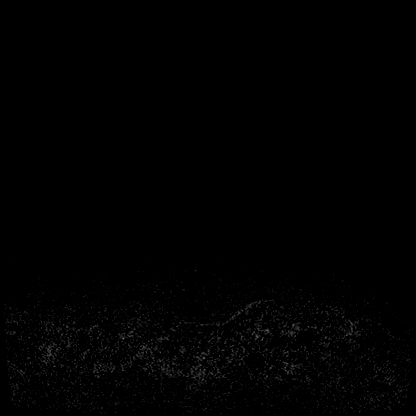

[Series 11: T1 · coronal · 320.0mm · 0.77mm/px · 1 of 3 slices shown (2 of 3)]
[im 1/3]
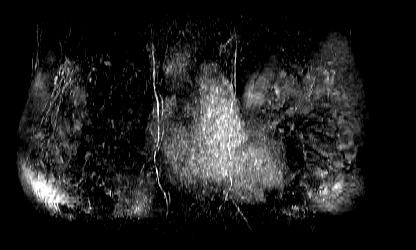

[Series 12: T1 · axial · 192.0mm · 0.77mm/px · 1 of 3 slices shown (3 of 3)]
[im 1/3]
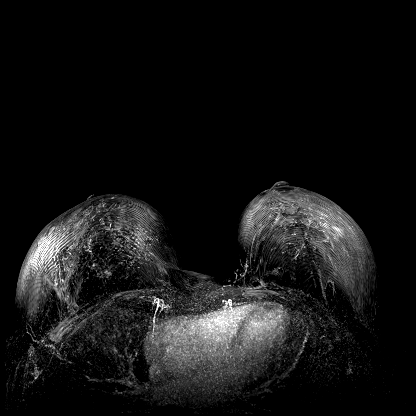

[Series 13: T1 fat-sat · axial · 1.6mm · 0.77mm/px · z∈[-88,+6]mm · 3 of 120 slices shown (4 of 4)]
[im 1/120]
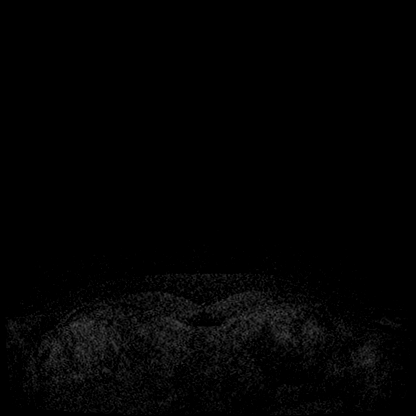
[im 30/120]
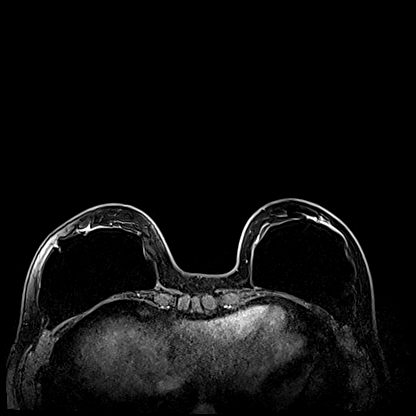
[im 60/120]
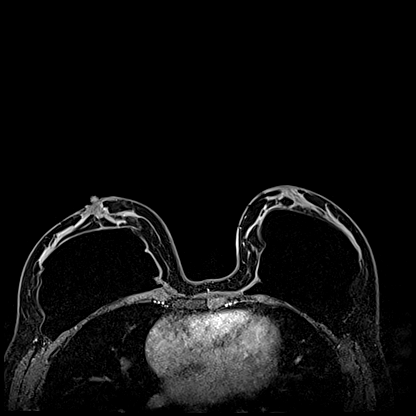

[29 of 48 positions shown; findings below may reference images not displayed]

Three-dimensional MR images were rendered by post-processing of the
original MR data on an independent workstation. The
three-dimensional MR images were interpreted, and findings are
reported in the following complete MRI report for this study. Three
dimensional images were evaluated at the independent interpreting
workstation using the DynaCAD thin client.
FINDINGS: Breast composition: c. Heterogeneous fibroglandular tissue.

Background parenchymal enhancement: Minimal

Right breast: There is persistent non mass enhancement within the
UPPER-OUTER QUADRANT of the RIGHT breast, spanning 4.6 x 4.5 x
centimeters. Area of non mass enhancement shows persistent type
enhancement kinetics. Previously, a using similar measurements, area
of non mass enhancement measured 5.0 x 4.8 x 2.3 centimeters.
Retropectoral silicone implant.

Left breast: No mass or abnormal enhancement. Retropectoral silicone
implant.

Lymph nodes: Tissue marker clip is identified adjacent to a lymph
node with normal morphology in the RIGHT axilla. No enlarged
axillary poor internal mammary lymph nodes.

Ancillary findings:  None.
IMPRESSION: 1. Interval resolution of RIGHT axillary adenopathy.
2. Slightly smaller area of non mass enhancement in the UPPER-OUTER
QUADRANT of the RIGHT breast.

RECOMMENDATION:
Treatment plan for known RIGHT breast malignancy.

BI-RADS CATEGORY  6: Known biopsy-proven malignancy.

## 2020-03-16 MED ORDER — GADOBUTROL 1 MMOL/ML IV SOLN
5.0000 mL | Freq: Once | INTRAVENOUS | Status: AC | PRN
  Administered 2020-03-16: 5 mL via INTRAVENOUS

## 2020-03-17 ENCOUNTER — Telehealth: Payer: Self-pay

## 2020-03-17 NOTE — Telephone Encounter (Signed)
Mr Caitlyn Miller and Ms [name] called for results of her MRI.  I relayed Dr Lewayne Bunting comments.  The tumor has is a little smaller.  She has appt with Surgeon on 03/22/2020

## 2020-03-19 ENCOUNTER — Encounter: Payer: Self-pay | Admitting: *Deleted

## 2020-03-22 ENCOUNTER — Ambulatory Visit: Payer: Self-pay | Admitting: Surgery

## 2020-03-22 DIAGNOSIS — C50911 Malignant neoplasm of unspecified site of right female breast: Secondary | ICD-10-CM

## 2020-03-22 NOTE — H&P (View-Only) (Signed)
History of Present Illness Caitlyn Miller. Caitlyn Klunder MD; 03/22/2020 12:46 PM) The patient is a 35 year old female who presents with breast cancer. Breast cancer [facility] - 11/19/19 [name] PCP - Dr. [name]  This is a 35 year old [name] female who presents about one year s/p bilateral retropectoral breast implants by Dr. Nathanial Rancher. The patient states that her last mammogram was several years ago. Over the last six months, she has noticed an enlarging palpable abnormality in the right breast - upper outer quadrant. She underwent workup which showed a 3.8 cm area of calcifications at 1100 4 cmfn. [place] measured this at 4.4 x 1.4 x. 1.4 cm with two thickened lymph nodes in the right axilla. 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 Her lymph node was positive, and the tumor was ER/PR positive, HER-2 was negative with a Ki-67 of 5%.  She also reports pain in the mid chest and mid back pain, (onset 3 years ago) which was previously intermittent and now constant in the past 3 months. She also notes pain and weakness in her b/l arms and legs. Her pain is 7-8/10. She had work up with MRI in [place] in summer 2021 which was reportedly negative. She notes the weakness leads to decreased function and activity. Her weight is mostly stable, but she is eating less. She describes frequent pain in her epigastrium that radiates through to her back, usually after eating or swallowing medication. She describes nausea, heartburn, bloating. She states that she had an ultrasound in [place] that showed no gallstones. She is on Protonix.  MRI and PET scan showed no left breast or distal metastases. Mammogram showed low risk disease saw chemotherapy was not given. She started on neoadjuvant tamoxifen but tolerated this poorly. She was switched to anastrozole and is also receiving Zoladex injections monthly. She  is also on reduced dose of Verzenio as she was not able to tolerate the full dose. Ultrasound performed on 03/01/20 showed that the right breast mass now measures 1.2 x 0.9 x 0.6 cm. The axilla shows only normal sized lymph nodes. MRI performed last week showed interval resolution of the right axillary adenopathy. The right upper outer quadrant shows persistent non-mass enhancement spanning 4.6 x 4.5 x 2.0 cm.     Problem List/Past Medical Rodman Key K. 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 Marielouise Amey, MD; 03/22/2020 12:46 PM) Breast Biopsy Bilateral.  Diagnostic Studies History Rodman Key K. Nabila Albarracin, MD; 03/22/2020 12:46 PM) Colonoscopy 5-10 years ago Mammogram within last year Pap Smear 1-5 years ago  Allergies Altamese Cabal, CMA; 03/22/2020 11:18 AM) Ibuprofen *ANALGESICS - ANTI-INFLAMMATORY* Nausea, Vomiting. Allergies Reconciled  Medication History Altamese Cabal, CMA; 03/22/2020 11:19 AM) Gabapentin (100MG Capsule, Oral) Active. Gabapentin (100MG Tablet, Oral) Active. Arimidex (1MG Tablet, Oral) Active. Zofran (8MG Tablet, Oral) Active. Lexapro (10MG Tablet, Oral) Active. hydrOXYzine HCl (25MG Tablet, Oral) Active. Protonix (40MG Tablet DR, Oral) Active. Medications Reconciled  Social History Caitlyn Miller. Tobyn Osgood, MD; 03/22/2020 12:46 PM) Caffeine use Coffee, Tea. No alcohol use No drug use Tobacco use Never smoker.  Family History Caitlyn Miller. Jannelle Notaro, MD; 03/22/2020 12:46 PM) Migraine Headache Mother.  Pregnancy / Birth History Caitlyn Miller. Braedon Sjogren, MD; 03/22/2020 12:46 PM) Age at menarche 14 years. Contraceptive History Intrauterine device. Gravida 2 Length (months) of breastfeeding 12-24 Maternal age 50-20 Para 2  Other Problems Caitlyn Miller. Jarold Macomber, MD; 03/22/2020 12:46 PM) Back Pain Chest pain Depression Gastroesophageal Reflux Disease     Physical Exam Rodman Key K.  Chelsee Hosie MD; 03/22/2020 12:47 PM)  The physical exam findings are as follows: Note:Constitutional: WDWN in NAD, conversant, no obvious deformities; resting comfortably Eyes: Pupils equal, round; sclera anicteric; moist conjunctiva; no lid lag HENT: Oral mucosa moist; good dentition Neck: No masses palpated, trachea midline; no thyromegaly Lungs: CTA bilaterally; normal respiratory effort Breasts: bilateral implants via inframammary incisions; no palpable mass in either breast; no axillary lymphadenopathy on either side CV: Regular rate and rhythm; no murmurs; extremities well-perfused with no edema Abd: +bowel sounds, soft, non-tender, no palpable organomegaly; no palpable hernias Musc: Normal gait; no apparent clubbing or cyanosis in extremities Lymphatic: No palpable cervical or axillary lymphadenopathy Skin: Warm, dry; no sign of jaundice Psychiatric - alert and oriented x 4; calm mood and affect    Assessment & Plan Rodman Key K. 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 The surgical procedure has been discussed with the patient. Potential risks, benefits, alternative treatments, and expected outcomes have been explained. All of the patient's questions at this time have been answered. The likelihood of reaching the patient's treatment goal is good. The patient understand the proposed surgical procedure and wishes to proceed. Note:I had an extended discussion with the patient and her husband. Clinically, the palpable mass has essentially resolved. I cannot really palpate a mass in the right upper outer quadrant. The clinically positive lymph nodes are now low no longer palpable. There is some concern with the persistent large area of non-mass enhancement in  the right upper outer quadrant. At this time, it is difficult to determine the cause of this non-mass enhancement. This may be residual changes post-neoadjuvant treatment.  At this point I recommended proceeding with a right radioactive seed localized lumpectomy. We will also perform a targeted axillary lymph node dissection to remove the previously biopsied lymph nodes as well as a sentinel lymph node biopsy using blue dye and radioactive tracer. They understand that if the margins of the lumpectomy comeback widely positive that she might need a larger surgery with possible mastectomy. They are in agreement with this plan.  Caitlyn Miller. Georgette Dover, MD, [facility]  General/ Trauma Surgery   03/22/2020 12:49 PM

## 2020-03-22 NOTE — H&P (Signed)
History of Present Illness Caitlyn Miller. Aquarius Tremper MD; 03/22/2020 12:46 PM) The patient is a 35 year old female who presents with breast cancer. Breast cancer [facility] - 11/19/19 [name] PCP - Dr. [name]  This is a 35 year old [name] female who presents about one year s/p bilateral retropectoral breast implants by Dr. Nathanial Rancher. The patient states that her last mammogram was several years ago. Over the last six months, she has noticed an enlarging palpable abnormality in the right breast - upper outer quadrant. She underwent workup which showed a 3.8 cm area of calcifications at 1100 4 cmfn. [place] measured this at 4.4 x 1.4 x. 1.4 cm with two thickened lymph nodes in the right axilla. 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 Her lymph node was positive, and the tumor was ER/PR positive, HER-2 was negative with a Ki-67 of 5%.  She also reports pain in the mid chest and mid back pain, (onset 3 years ago) which was previously intermittent and now constant in the past 3 months. She also notes pain and weakness in her b/l arms and legs. Her pain is 7-8/10. She had work up with MRI in [place] in summer 2021 which was reportedly negative. She notes the weakness leads to decreased function and activity. Her weight is mostly stable, but she is eating less. She describes frequent pain in her epigastrium that radiates through to her back, usually after eating or swallowing medication. She describes nausea, heartburn, bloating. She states that she had an ultrasound in [place] that showed no gallstones. She is on Protonix.  MRI and PET scan showed no left breast or distal metastases. Mammogram showed low risk disease saw chemotherapy was not given. She started on neoadjuvant tamoxifen but tolerated this poorly. She was switched to anastrozole and is also receiving Zoladex injections monthly. She  is also on reduced dose of Verzenio as she was not able to tolerate the full dose. Ultrasound performed on 03/01/20 showed that the right breast mass now measures 1.2 x 0.9 x 0.6 cm. The axilla shows only normal sized lymph nodes. MRI performed last week showed interval resolution of the right axillary adenopathy. The right upper outer quadrant shows persistent non-mass enhancement spanning 4.6 x 4.5 x 2.0 cm.     Problem List/Past Medical Rodman Key K. 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 Zhion Pevehouse, MD; 03/22/2020 12:46 PM) Breast Biopsy Bilateral.  Diagnostic Studies History Rodman Key K. Myrta Mercer, MD; 03/22/2020 12:46 PM) Colonoscopy 5-10 years ago Mammogram within last year Pap Smear 1-5 years ago  Allergies Altamese Cabal, CMA; 03/22/2020 11:18 AM) Ibuprofen *ANALGESICS - ANTI-INFLAMMATORY* Nausea, Vomiting. Allergies Reconciled  Medication History Altamese Cabal, CMA; 03/22/2020 11:19 AM) Gabapentin (100MG Capsule, Oral) Active. Gabapentin (100MG Tablet, Oral) Active. Arimidex (1MG Tablet, Oral) Active. Zofran (8MG Tablet, Oral) Active. Lexapro (10MG Tablet, Oral) Active. hydrOXYzine HCl (25MG Tablet, Oral) Active. Protonix (40MG Tablet DR, Oral) Active. Medications Reconciled  Social History Caitlyn Miller. Gavynn Duvall, MD; 03/22/2020 12:46 PM) Caffeine use Coffee, Tea. No alcohol use No drug use Tobacco use Never smoker.  Family History Caitlyn Miller. Onesty Clair, MD; 03/22/2020 12:46 PM) Migraine Headache Mother.  Pregnancy / Birth History Caitlyn Miller. Christoher Drudge, MD; 03/22/2020 12:46 PM) Age at menarche 14 years. Contraceptive History Intrauterine device. Gravida 2 Length (months) of breastfeeding 12-24 Maternal age 50-20 Para 2  Other Problems Caitlyn Miller. Jailan Trimm, MD; 03/22/2020 12:46 PM) Back Pain Chest pain Depression Gastroesophageal Reflux Disease     Physical Exam Rodman Key K.  Kerim Statzer MD; 03/22/2020 12:47 PM)  The physical exam findings are as follows: Note:Constitutional: WDWN in NAD, conversant, no obvious deformities; resting comfortably Eyes: Pupils equal, round; sclera anicteric; moist conjunctiva; no lid lag HENT: Oral mucosa moist; good dentition Neck: No masses palpated, trachea midline; no thyromegaly Lungs: CTA bilaterally; normal respiratory effort Breasts: bilateral implants via inframammary incisions; no palpable mass in either breast; no axillary lymphadenopathy on either side CV: Regular rate and rhythm; no murmurs; extremities well-perfused with no edema Abd: +bowel sounds, soft, non-tender, no palpable organomegaly; no palpable hernias Musc: Normal gait; no apparent clubbing or cyanosis in extremities Lymphatic: No palpable cervical or axillary lymphadenopathy Skin: Warm, dry; no sign of jaundice Psychiatric - alert and oriented x 4; calm mood and affect    Assessment & Plan Rodman Key K. Jayleen Scaglione MD; 03/22/2020 12:49 PM)  INVASIVE DUCTAL CARCINOMA OF RIGHT BREAST, STAGE 2 (C50.911)  Current Plans Schedule for Surgery - Right radioactive seed localized lumpectomy/ right axillary targeted lymph node dissection with sentinel lymph node biopsy/ blue dye injection. The surgical procedure has been discussed with the patient. Potential risks, benefits, alternative treatments, and expected outcomes have been explained. All of the patient's questions at this time have been answered. The likelihood of reaching the patient's treatment goal is good. The patient understand the proposed surgical procedure and wishes to proceed. Note:I had an extended discussion with the patient and her husband. Clinically, the palpable mass has essentially resolved. I cannot really palpate a mass in the right upper outer quadrant. The clinically positive lymph nodes are now low no longer palpable. There is some concern with the persistent large area of non-mass enhancement in  the right upper outer quadrant. At this time, it is difficult to determine the cause of this non-mass enhancement. This may be residual changes post-neoadjuvant treatment.  At this point I recommended proceeding with a right radioactive seed localized lumpectomy. We will also perform a targeted axillary lymph node dissection to remove the previously biopsied lymph nodes as well as a sentinel lymph node biopsy using blue dye and radioactive tracer. They understand that if the margins of the lumpectomy comeback widely positive that she might need a larger surgery with possible mastectomy. They are in agreement with this plan.  Caitlyn Miller. Georgette Dover, MD, [facility]  General/ Trauma Surgery   03/22/2020 12:49 PM

## 2020-03-23 ENCOUNTER — Telehealth: Payer: Self-pay | Admitting: Hematology

## 2020-03-23 NOTE — Telephone Encounter (Signed)
Rescheduled appt per Provider request from Dr. Burr Medico . Rescheduled apt from 3/16 to 3/17 . Unable to reach pt. Left message for patient with appt date and time

## 2020-03-24 ENCOUNTER — Telehealth: Payer: Self-pay

## 2020-03-24 NOTE — Progress Notes (Signed)
[place]   Telephone:([phone] Fax:([phone]) [phone]   Clinic Follow up Note   Patient Care Team: [name], Lilia Argue, MD as PCP - General (Family Medicine) Mansouraty, Telford Nab., MD as Consulting Physician (Gastroenterology) Mauro Kaufmann, RN as Oncology Nurse Navigator Rockwell Germany, RN as Oncology Nurse Navigator Donnie Mesa, MD as Consulting Physician (General Surgery) Truitt Merle, MD as Consulting Physician (Hematology) Kyung Rudd, MD as Consulting Physician (Radiation Oncology) Contogiannis, Audrea Muscat, MD as Consulting Physician (Plastic Surgery)  Date of Service:  03/25/2020  CHIEF COMPLAINT: F/u of right breast cancer  SUMMARY OF ONCOLOGIC HISTORY: Oncology History Overview Note  Cancer Staging Malignant neoplasm of upper-outer quadrant of right breast in female, estrogen receptor positive ([place]) Staging form: Breast, AJCC 8th Edition - Clinical stage from 11/12/2019: Stage IIA (cT2, cN1, cM0, G2, ER+, PR+, HER2-) - Signed by Truitt Merle, MD on 11/19/2019    Malignant neoplasm of upper-outer quadrant of right breast in female, estrogen receptor positive ([place])  11/05/2019 Mammogram   IMPRESSION: Large irregular palpable mass 4.7 x 1.4 x 1.4 cm within the upper-outer right breast 11 o'clock position, 4 cm from nipple.  There is a large area of associated coarse heterogeneous calcifications within the mass. Overall findings are concerning for breast carcinoma.   Two cortically thickened right axillary lymph nodes which are indeterminate in etiology.   11/12/2019 Cancer Staging   Staging form: Breast, AJCC 8th Edition - Clinical stage from 11/12/2019: Stage IIA (cT2, cN1, cM0, G2, ER+, PR+, HER2-) - Signed by Truitt Merle, MD on 11/19/2019   11/12/2019 Initial Biopsy   Diagnosis 1. Breast, right, needle core biopsy, right - INVASIVE MAMMARY CARCINOMA, SEE COMMENT. - MAMMARY CARCINOMA IN SITU. 2. Lymph node, needle/core biopsy, right -  METASTATIC MAMMARY CARCINOMA. Microscopic Comment 1. The carcinoma appears grade 2 and measures 16 mm in greatest linear extent. E-cadherin will be ordered. Prognostic makers will be ordered. Dr. Saralyn Pilar has reviewed the case. The case was called to [facility] on 01/13/2020.    1. E-cadherin is strongly positive consistent with a ductal phenotype.   11/12/2019 Receptors her2   1. PROGNOSTIC INDICATORS Results: IMMUNOHISTOCHEMICAL AND MORPHOMETRIC ANALYSIS PERFORMED MANUALLY The tumor cells are NEGATIVE for Her2 (0). Estrogen Receptor: 100%, POSITIVE, STRONG STAINING INTENSITY Progesterone Receptor: 100%, POSITIVE, STRONG STAINING INTENSITY Proliferation Marker Ki67: 5%   11/17/2019 Initial Diagnosis   Malignant neoplasm of upper-outer quadrant of right breast in female, estrogen receptor positive ([place])   11/25/2019 Breast MRI   IMPRESSION: 1. Large area of non-mass enhancement involving the UPPER OUTER QUADRANT of the RIGHT breast measuring approximately 4.2 x 4.1 x 2.4 cm. The biopsy-proven IDC and DCIS is present at the superomedial aspect of this NME. 2. No MRI evidence of malignancy involving the LEFT breast. 3. Intact BILATERAL retropectoral implants. 4. 2 pathologically enlarged RIGHT axillary lymph nodes. One of these nodes is biopsy-proven metastatic disease. No pathologic lymphadenopathy elsewhere   11/25/2019 PET scan   IMPRESSION: 1. Two mildly enlarged hypermetabolic right axillary lymph nodes compatible with right axillary nodal metastases. 2. No additional sites of hypermetabolic metastatic disease. 3. Asymmetric indistinct upper right breast hypermetabolism, without discrete mass correlate on the CT images, compatible with known primary right breast malignancy.     11/26/2019 Genetic Testing   Negative genetic testing: no pathogenic variants detected in [facility] STAT Breast Cancer Panel.  Variant of uncertain significance (VUS) detected in  CHEK2 at c.1556G>T (p.Arg519Leu).  The report date  is November 26, 2019.   The STAT Breast cancer panel offered by [facility] includes sequencing and rearrangement analysis for the following 9 genes:  ATM, BRCA1, BRCA2, CDH1, CHEK2, PALB2, PTEN, STK11 and TP53.    Results of [facility] Multi-Cancer Panel are pending.    12/01/2019 -  Neo-Adjuvant Anti-estrogen oral therapy   Neoadjuvant Tamoxifen 51m on 12/01/19. Reduced to 161mon 12/09/19.       ---Zoladex injection monthly starting 12/09/19.       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  Variants of uncertain significance detected RAD51D at c.715C>T (p.Arg239Trp) and CHEK2 at c.1556G>T (p.Arg519Leu).  The report date is December 08, 2019.   The Multi-Cancer Panel offered by [facility] includes sequencing and/or deletion duplication testing of the following 85 genes: AIP, ALK, APC, ATM, AXIN2,BAP1,  BARD1, BLM, BMPR1A, BRCA1, BRCA2, BRIP1, CASR, CDC73, CDH1, CDK4, CDKN1B, CDKN1C, CDKN2A (p14ARF), CDKN2A (p16INK4a), CEBPA, CHEK2, CTNNA1, DICER1, DIS3L2, EGFR (c.2369C>T, p.Thr790Met variant only), EPCAM (Deletion/duplication testing only), FH, FLCN, GATA2, GPC3, GREM1 (Promoter region deletion/duplication testing only), HOXB13 (c.251G>A, p.Gly84Glu), HRAS, KIT, MAX, MEN1, MET, MITF (c.952G>A, p.Glu318Lys variant only), MLH1, MSH2, MSH3, MSH6, MUTYH, NBN, NF1, NF2, NTHL1, PALB2, PDGFRA, PHOX2B, PMS2, POLD1, POLE, POT1, PRKAR1A, PTCH1, PTEN, RAD50, RAD51C, RAD51D, RB1, RECQL4, RET, RNF43, RUNX1, SDHAF2, SDHA (sequence changes only), SDHB, SDHC, SDHD, SMAD4, SMARCA4, SMARCB1, SMARCE1, STK11, SUFU, TERC, TERT, TMEM127, TP53, TSC1, TSC2, VHL, WRN and WT1.    03/01/2020 Mammogram   Targeted ultrasound is performed, showing interval decreasing conspicuity of the patient's known right breast cancer. A  post biopsy clip with an associated irregular area shadowing is demonstrated at the 11 o'clock position 4 cm from the nipple. Exact measurements are difficult due to the vague appearance of the findings. It measures approximately 1.2 x 0.9 x 0.6 cm (previously 4.7 x 2.2 x 1.5 cm).   IMPRESSION: Imaging findings consistent with good response to chemotherapy.   03/16/2020 Imaging   MRI Breast  IMPRESSION: 1. Interval resolution of RIGHT axillary adenopathy. 2. Slightly smaller area of non mass enhancement in the UPPER-OUTER QUADRANT of the RIGHT breast.      CURRENT THERAPY:  Neoadjuvant Tamoxifen 204mily startedon 12/01/19. Reduced to 1m84m 11/23/21due to poor tolerance.Zoladex injection monthly starting 12/09/19.Switched Tamoxifen to Anastrozole on 01/06/20. Verzeniostarted on 01/12/2020, dose reduced to 50mg86mand 100mg 37m/2022 due to poor tolerance  INTERVAL HISTORY:  Kimmie Audreyanna Butkiewiczre for a follow up. She presents to the clinic alone. She has been having worsening nausea, vomiting, and poor oral intake for the past 3 to 4 days, she states she has not been able to keep much, even water down.  She had intermittent nausea and epigastric discomfort from medicine since she started Verzenio and anastrozole, but it is much worse this week.  No fever or chills, no abdominal cramps, she has more bowel movement every 2 to 3 days, which she contributes to low oral intake.  She noticed mild dizziness, no other new symptoms.  All other systems were reviewed with the patient and are negative.  MEDICAL HISTORY:  Past Medical History:  Diagnosis Date  . Abnormal Pap smear   . Acid reflux   . Anemia   . Anxiety   . Breast cancer (HCC)  [place]  . Depression   . Endometriosis 10/2004  . Epigastric pain 10/2006  . Family history  of breast cancer 11/19/2019  . FH: migraines   . GBS carrier   . H/O amenorrhea 06/2006  . H/O dyspareunia 7/07  . H/O  fatigue   . H/O nausea and vomiting 10/2006  . H/O rubella   . H/O varicella   . Hyperemesis arising during pregnancy    First pregnancy  . Irregular periods/menstrual cycles 02/2005  . 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 Pelvic pain 09/2008    SURGICAL HISTORY: Past Surgical History:  Procedure Laterality Date  . AUGMENTATION MAMMAPLASTY Bilateral 0454   silicone   . WISDOM TOOTH EXTRACTION      I have reviewed the social history and family history with the patient and they are unchanged from previous note.  ALLERGIES:  is allergic to ibuprofen.  MEDICATIONS:  Current Outpatient Medications  Medication Sig Dispense Refill  . prochlorperazine (COMPAZINE) 5 MG tablet Take 1-2 tablets (5-10 mg total) by mouth every 6 (six) hours as needed for nausea or vomiting. 30 tablet 1  . sucralfate (CARAFATE) 1 g tablet Take 1 tablet (1 g total) by mouth 4 (four) times daily -  with meals and at bedtime. 60 tablet 1  . abemaciclib (VERZENIO) 50 MG tablet Take 1 tablet (50 mg) in the morning and 2 tablets (100 mg) in the evening. Swallow tablets whole. Do not chew, crush, or split tablets before swallowing. 90 tablet 1  . anastrozole (ARIMIDEX) 1 MG tablet Take 1 tablet (1 mg total) by mouth daily. 30 tablet 3  . clindamycin (CLINDAGEL) 1 % gel Apply topically 2 (two) times daily. 30 g 0  . gabapentin (NEURONTIN) 100 MG capsule Take 2 capsules (200 mg total) by mouth at bedtime. 60 capsule 1  . ondansetron (ZOFRAN ODT) 8 MG disintegrating tablet Take 1 tablet (8 mg total) by mouth every 8 (eight) hours as needed for nausea or vomiting. 30 tablet 2  . pantoprazole (PROTONIX) 40 MG tablet Take 1 tablet (40 mg total) by mouth daily as needed. 30 tablet 5  . venlafaxine (EFFEXOR) 37.5 MG tablet TAKE 1 TABLET(37.5 MG) BY MOUTH TWICE DAILY 30 tablet 4   Current Facility-Administered Medications  Medication Dose Route Frequency Provider Last Rate Last Admin  . 0.9 %  sodium chloride  infusion   Intravenous Continuous Truitt Merle, MD   Stopped at 03/25/20 1126    PHYSICAL EXAMINATION: ECOG PERFORMANCE STATUS: 2 - Symptomatic, <50% confined to bed  Vitals:   03/25/20 0832  BP: 120/81  Pulse: 79  Resp: 18  Temp: (!) 97.2 F (36.2 C)  SpO2: 99%   Filed Weights   03/25/20 0832  Weight: 126 lb (57.2 kg)    GENERAL:alert, no distress and comfortable SKIN: skin color, texture, turgor are normal, no rashes or significant lesions EYES: normal, Conjunctiva are pink and non-injected, sclera clear Musculoskeletal:no cyanosis of digits and no clubbing  NEURO: alert & oriented x 3 with fluent speech, no focal motor/sensory deficits  LABORATORY DATA:  I have reviewed the data as listed CBC Latest Ref Rng & Units 03/25/2020 03/03/2020 01/27/2020  WBC 4.0 - 10.5 K/uL 4.7 5.8 5.7  Hemoglobin 12.0 - 15.0 g/dL 12.0 12.6 12.2  Hematocrit 36.0 - 46.0 % 35.7(L) 37.7 36.9  Platelets 150 - 400 K/uL 282 261 209     CMP Latest Ref Rng & Units 03/25/2020 03/03/2020 01/27/2020  Glucose 70 - 99 mg/dL 87 97 94  BUN 6 - 20 mg/dL _0 Creatinine 0.44 - 1.00 mg/dL 0.90  0.89 0.82  Sodium 135 - 145 mmol/L 141 141 140  Potassium 3.5 - 5.1 mmol/L 3.9 3.9 4.5  Chloride 98 - 111 mmol/L 107 106 107  CO2 22 - 32 mmol/L _0 Calcium 8.9 - 10.3 mg/dL 9.6 9.3 9.2  Total Protein 6.5 - 8.1 g/dL 7.9 7.7 7.1  Total Bilirubin 0.3 - 1.2 mg/dL 0.5 0.6 0.4  Alkaline Phos 38 - 126 U/L 41 40 34(L)  AST 15 - 41 U/L _1 ALT 0 - 44 U/L _2 RADIOGRAPHIC STUDIES: I have personally reviewed the radiological images as listed and agreed with the findings in the report. No results found.   ASSESSMENT & PLAN:  Caitlyn Miller is a 35 y.o. female with    1. Nausea and Vomiting  -secondary to medicine, no other particular trigger, no signs of infection -She has normal vital sign, lab reviewed, unremarkable. -Due to her poor oral intake, I will give her normal saline 1 L today, and  again in 2 days. -We will hold Verzenio for now, and continue anastrozole through her surgery.   2.Malignant neoplasm of upper-outer quadrant of right breast, StageIIA, c(T2N1M0), ER+/PR+/HER2-, Levester Fresh, Mammaprintluminal type A, low risk -She has a 4.7cm right breast mass at 11:00 position andtwo abnormalright axillary LN. Her10/27/21biopsy shows grade II invasive ductal carcinomawithmetastasisto her LN, ER/PRstrongly positive, HER2 negative, low Ki67.  -Her 11/2019 breast MRI and PET scan did not indicate left breast or distant malignancy or metastasis. -Due to her large tumor, she needsmastectomy if she proceeded with surgery first. She has been seen by Dr. Georgette Dover.Her mastectomy will be complicated by history of implant placement. -Mammaprint showed low risk disease,shewouldunlikely benefit from chemotherapy, and neoadjuvant chemotherapy is not recommended. -I have started her on neoadjuvant tamoxifen on 12/01/19. She tolerated poorly with nausea, and diffuse body aches. Dose reduced to 69m from 12/09/19.I started her on monthly Zoladex injection on 12/09/19. Then switched Tamoxifen to Anastrozole on 01/06/20. -She is not able to tolerate a full dose Verzenio, performance status regular due to severe fatigue. I reduced her dose to 50 mg in the morning and 100 mg at night on 02/10/20 and she is tolerating better.  -It depends on her final surgical path, may continue same regimen or just continue anastrozole after surgery.  -Her [place] showed her right breast mass now measures approximately 1.2 x 0.9 x 0.6 cm (previously 4.7 x 2.2 x 1.5 cm). Nodes appear to be normal now, She overall has had great response to treatment. -Her 03/16/20 Breast MRI shows slightly reduced non-mass enhancement within the right breast, no other new lesions. -She has seen Dr TGeorgette Doverafter her breast MRI and right breast lumpectomy and targeted lymph node dissection were offered. If she  has positive margins, she may need mastectomy  -f/u after surgery  -Due to her recent worsening nausea and vomiting, will hold on Verzenio for now, to further evaluation. -Continue Zoladex injection and anastrozole.  She may want to change back to tamoxifen after surgery due to her travels  -Follow-up in 3 weeks  2.GeneralizedWeakness, body pain, Fatigue  -Pt notes for the past 3 years she has had mid chest and mid back pain. Previously intermittent, now constant. She also notes b/l arm and leg weakness and pain along with tingling of her toes and fingers.  -This has lead to fatigue, low appetite, decreased daily function/activity.  -Pt notes prior workup for this in  [place] was negative with Neurologist and cardiologist. They said this is related to stress, anxiety or depression. Pt feels she has an illness causing this instead.  -Her 11/25/19 PET was negative for any distant malignancy or metastasis. -Symptoms of fatigue, gum bleeding and body pain and now recently mucus output with stool. Will continue to monitor before and after breast surgery (03/03/20)  3. Anxiety, depression  -Pt notes feeling more anxious (due to health before breast cancer), than depressed.  -Shewason Lexapro,I switched her toEffexor, she is tolerating better  4. Genetic Testingnegative for pathogenetic mutationsin RAD51D, andVariant of uncertain significance (VUS) detected inCHEK2at c.1556G>T (p.Arg519Leu). Nunzio Cory discussed with our genetic consoler  5. Social Support  -She is from [place] and lives both there and in [place] with her husband. She has 2 teenage children. The rest of her family is in [place].    PLAN: -1L NS today and in 2 days  -We will stop Verzenio, continue anastrozole -Continue Zoladex injection every 4 weeks, due next week .  -she is likely going to have surgery next week -lab and f/u in 3 weeks    No problem-specific Assessment & Plan notes found for this  encounter.   No orders of the defined types were placed in this encounter.  All questions were answered. The patient knows to call the clinic with any problems, questions or concerns. No barriers to learning was detected. The total time spent in the appointment was 30 minutes.     Truitt Merle, MD 03/25/2020   I, Joslyn Devon, am acting as scribe for Truitt Merle, MD.   I have reviewed the above documentation for accuracy and completeness, and I agree with the above.

## 2020-03-24 NOTE — Telephone Encounter (Signed)
Rachid called stating the Caitlyn Miller has been vomiting for the past 5 days.   She is afebrile and denies diarrhea.  She is taking zofran but it does not help.  Appt made for tomorrow

## 2020-03-25 ENCOUNTER — Inpatient Hospital Stay: Payer: 59 | Admitting: Hematology

## 2020-03-25 ENCOUNTER — Encounter: Payer: Self-pay | Admitting: Hematology

## 2020-03-25 ENCOUNTER — Other Ambulatory Visit: Payer: Self-pay

## 2020-03-25 ENCOUNTER — Inpatient Hospital Stay: Payer: 59 | Attending: Hematology

## 2020-03-25 ENCOUNTER — Inpatient Hospital Stay: Payer: 59

## 2020-03-25 VITALS — BP 120/81 | HR 79 | Temp 97.2°F | Resp 18 | Ht 62.0 in | Wt 126.0 lb

## 2020-03-25 DIAGNOSIS — C50411 Malignant neoplasm of upper-outer quadrant of right female breast: Secondary | ICD-10-CM | POA: Insufficient documentation

## 2020-03-25 DIAGNOSIS — R531 Weakness: Secondary | ICD-10-CM | POA: Diagnosis not present

## 2020-03-25 DIAGNOSIS — R112 Nausea with vomiting, unspecified: Secondary | ICD-10-CM | POA: Insufficient documentation

## 2020-03-25 DIAGNOSIS — Z5111 Encounter for antineoplastic chemotherapy: Secondary | ICD-10-CM | POA: Insufficient documentation

## 2020-03-25 DIAGNOSIS — Z170 Estrogen receptor positive status [ER+]: Secondary | ICD-10-CM | POA: Diagnosis not present

## 2020-03-25 DIAGNOSIS — R5383 Other fatigue: Secondary | ICD-10-CM | POA: Diagnosis not present

## 2020-03-25 DIAGNOSIS — E860 Dehydration: Secondary | ICD-10-CM

## 2020-03-25 LAB — CMP (CANCER CENTER ONLY)
ALT: 15 U/L (ref 0–44)
AST: 18 U/L (ref 15–41)
Albumin: 4.4 g/dL (ref 3.5–5.0)
Alkaline Phosphatase: 41 U/L (ref 38–126)
Anion gap: 8 (ref 5–15)
BUN: 11 mg/dL (ref 6–20)
CO2: 26 mmol/L (ref 22–32)
Calcium: 9.6 mg/dL (ref 8.9–10.3)
Chloride: 107 mmol/L (ref 98–111)
Creatinine: 0.9 mg/dL (ref 0.44–1.00)
GFR, Estimated: >60 mL/min (ref >60)
Glucose, Bld: 87 mg/dL (ref 70–99)
Potassium: 3.9 mmol/L (ref 3.5–5.1)
Sodium: 141 mmol/L (ref 135–145)
Total Bilirubin: 0.5 mg/dL (ref 0.3–1.2)
Total Protein: 7.9 g/dL (ref 6.5–8.1)

## 2020-03-25 LAB — CBC WITH DIFFERENTIAL (CANCER CENTER ONLY)
Abs Immature Granulocytes: 0.02 10*3/uL (ref 0.00–0.07)
Basophils Absolute: 0.1 10*3/uL (ref 0.0–0.1)
Basophils Relative: 1 %
Eosinophils Absolute: 0.1 10*3/uL (ref 0.0–0.5)
Eosinophils Relative: 2 %
HCT: 35.7 % — ABNORMAL LOW (ref 36.0–46.0)
Hemoglobin: 12 g/dL (ref 12.0–15.0)
Immature Granulocytes: 0 %
Lymphocytes Relative: 54 %
Lymphs Abs: 2.6 10*3/uL (ref 0.7–4.0)
MCH: 31.4 pg (ref 26.0–34.0)
MCHC: 33.6 g/dL (ref 30.0–36.0)
MCV: 93.5 fL (ref 80.0–100.0)
Monocytes Absolute: 0.3 10*3/uL (ref 0.1–1.0)
Monocytes Relative: 6 %
Neutro Abs: 1.7 10*3/uL (ref 1.7–7.7)
Neutrophils Relative %: 37 %
Platelet Count: 282 10*3/uL (ref 150–400)
RBC: 3.82 MIL/uL — ABNORMAL LOW (ref 3.87–5.11)
RDW: 13.8 % (ref 11.5–15.5)
WBC Count: 4.7 10*3/uL (ref 4.0–10.5)
nRBC: 0.4 % — ABNORMAL HIGH (ref 0.0–0.2)

## 2020-03-25 MED ORDER — SODIUM CHLORIDE 0.9 % IV SOLN
INTRAVENOUS | Status: DC
  Filled 2020-03-25 (×2): qty 250

## 2020-03-25 MED ORDER — SUCRALFATE 1 G PO TABS: 1.0000 g | ORAL_TABLET | Freq: Three times a day (TID) | ORAL | 1 refills | Status: DC

## 2020-03-25 MED ORDER — PROCHLORPERAZINE MALEATE 5 MG PO TABS: 5.0000 mg | ORAL_TABLET | Freq: Four times a day (QID) | ORAL | 1 refills | Status: DC | PRN

## 2020-03-25 NOTE — Patient Instructions (Signed)
Rehydration, Adult Rehydration is the replacement of body fluids, salts, and minerals (electrolytes) that are lost during dehydration. Dehydration is when there is not enough water or other fluids in the body. This happens when you lose more fluids than you take in. Common causes of dehydration include:  Not drinking enough fluids. This can occur when you are ill or doing activities that require a lot of energy, especially in hot weather.  Conditions that cause loss of water or other fluids, such as diarrhea, vomiting, sweating, or urinating a lot.  Other illnesses, such as fever or infection.  Certain medicines, such as those that remove excess fluid from the body (diuretics). Symptoms of mild or moderate dehydration may include thirst, dry lips and mouth, and dizziness. Symptoms of severe dehydration may include increased heart rate, confusion, fainting, and not urinating. For severe dehydration, you may need to get fluids through an IV at the hospital. For mild or moderate dehydration, you can usually rehydrate at home by drinking certain fluids as told by your health care provider. What are the risks? Generally, rehydration is safe. However, taking in too much fluid (overhydration) can be a problem. This is rare. Overhydration can cause an electrolyte imbalance, kidney failure, or a decrease in salt (sodium) levels in the body. Supplies needed You will need an oral rehydration solution (ORS) if your health care provider tells you to use one. This is a drink to treat dehydration. It can be found in pharmacies and retail stores. How to rehydrate Fluids Follow instructions from your health care provider for rehydration. The kind of fluid and the amount you should drink depend on your condition. In general, you should choose drinks that you prefer.  If told by your health care provider, drink an ORS. ? Make an ORS by following instructions on the package. ? Start by drinking small amounts,  about  cup (120 mL) every 5-10 minutes. ? Slowly increase how much you drink until you have taken the amount recommended by your health care provider.  Drink enough clear fluids to keep your urine pale yellow. If you were told to drink an ORS, finish it first, then start slowly drinking other clear fluids. Drink fluids such as: ? Water. This includes sparkling water and flavored water. Drinking only water can lead to having too little sodium in your body (hyponatremia). Follow the advice of your health care provider. ? Water from ice chips you suck on. ? Fruit juice with water you add to it (diluted). ? Sports drinks. ? Hot or cold herbal teas. ? Broth-based soups. ? Milk or milk products. Food Follow instructions from your health care provider about what to eat while you rehydrate. Your health care provider may recommend that you slowly begin eating regular foods in small amounts.  Eat foods that contain a healthy balance of electrolytes, such as bananas, oranges, potatoes, tomatoes, and spinach.  Avoid foods that are greasy or contain a lot of sugar. In some cases, you may get nutrition through a feeding tube that is passed through your nose and into your stomach (nasogastric tube, or NG tube). This may be done if you have uncontrolled vomiting or diarrhea.   Beverages to avoid Certain beverages may make dehydration worse. While you rehydrate, avoid drinking alcohol.   How to tell if you are recovering from dehydration You may be recovering from dehydration if:  You are urinating more often than before you started rehydrating.  Your urine is pale yellow.  Your energy level   improves.  You vomit less frequently.  You have diarrhea less frequently.  Your appetite improves or returns to normal.  You feel less dizzy or less light-headed.  Your skin tone and color start to look more normal. Follow these instructions at home:  Take over-the-counter and prescription medicines only  as told by your health care provider.  Do not take sodium tablets. Doing this can lead to having too much sodium in your body (hypernatremia). Contact a health care provider if:  You continue to have symptoms of mild or moderate dehydration, such as: ? Thirst. ? Dry lips. ? Slightly dry mouth. ? Dizziness. ? Dark urine or less urine than normal. ? Muscle cramps.  You continue to vomit or have diarrhea. Get help right away if you:  Have symptoms of dehydration that get worse.  Have a fever.  Have a severe headache.  Have been vomiting and the following happens: ? Your vomiting gets worse or does not go away. ? Your vomit includes blood or green matter (bile). ? You cannot eat or drink without vomiting.  Have problems with urination or bowel movements, such as: ? Diarrhea that gets worse or does not go away. ? Blood in your stool (feces). This may cause stool to look black and tarry. ? Not urinating, or urinating only a small amount of very dark urine, within 6-8 hours.  Have trouble breathing.  Have symptoms that get worse with treatment. These symptoms may represent a serious problem that is an emergency. Do not wait to see if the symptoms will go away. Get medical help right away. Call your local emergency services (911 in the [place] Do not drive yourself to the hospital. Summary  Rehydration is the replacement of body fluids and minerals (electrolytes) that are lost during dehydration.  Follow instructions from your health care provider for rehydration. The kind of fluid and amount you should drink depend on your condition.  Slowly increase how much you drink until you have taken the amount recommended by your health care provider.  Contact your health care provider if you continue to show signs of mild or moderate dehydration. This information is not intended to replace advice given to you by your health care provider. Make sure you discuss any questions you have with  your health care provider. Document Revised: 03/05/2019 Document Reviewed: 01/13/2019 Elsevier Patient Education  2021 [employer]vier Inc.  

## 2020-03-26 ENCOUNTER — Telehealth: Payer: Self-pay | Admitting: Hematology

## 2020-03-26 ENCOUNTER — Other Ambulatory Visit: Payer: Self-pay | Admitting: Surgery

## 2020-03-26 ENCOUNTER — Encounter: Payer: Self-pay | Admitting: *Deleted

## 2020-03-26 DIAGNOSIS — C50911 Malignant neoplasm of unspecified site of right female breast: Secondary | ICD-10-CM

## 2020-03-26 NOTE — Telephone Encounter (Signed)
R/s appts per 3/11 sch msg. Called pt no answer. Left vm with updated appts.

## 2020-03-26 NOTE — Telephone Encounter (Signed)
Called patient regarding 03/12 appointment, patient is out of town and cannot make it. Sent scheduling message to get treatment done 03/15.

## 2020-03-27 ENCOUNTER — Ambulatory Visit: Payer: 59

## 2020-03-27 ENCOUNTER — Inpatient Hospital Stay: Payer: 59

## 2020-03-29 ENCOUNTER — Telehealth: Payer: Self-pay | Admitting: *Deleted

## 2020-03-29 NOTE — Telephone Encounter (Signed)
Husband left a message stating Caitlyn Miller needs an appt Tuesday or Wednesday for IVF. Message to scheduler

## 2020-03-30 ENCOUNTER — Other Ambulatory Visit: Payer: Self-pay

## 2020-03-30 ENCOUNTER — Telehealth: Payer: Self-pay | Admitting: Hematology

## 2020-03-30 ENCOUNTER — Inpatient Hospital Stay: Payer: 59

## 2020-03-30 VITALS — BP 102/64 | HR 62 | Temp 98.6°F | Resp 18

## 2020-03-30 DIAGNOSIS — E860 Dehydration: Secondary | ICD-10-CM

## 2020-03-30 DIAGNOSIS — Z5111 Encounter for antineoplastic chemotherapy: Secondary | ICD-10-CM | POA: Diagnosis not present

## 2020-03-30 DIAGNOSIS — R110 Nausea: Secondary | ICD-10-CM

## 2020-03-30 MED ORDER — SODIUM CHLORIDE 0.9 % IV SOLN
Freq: Once | INTRAVENOUS | Status: AC
  Filled 2020-03-30: qty 250

## 2020-03-30 MED ORDER — ONDANSETRON HCL 8 MG PO TABS
8.0000 mg | ORAL_TABLET | Freq: Once | ORAL | Status: AC
  Administered 2020-06-10: 4 mg via ORAL

## 2020-03-30 NOTE — Telephone Encounter (Signed)
Scheduled per 03/14 scheduled message, patient is notified of 03/15 appointment.

## 2020-03-30 NOTE — Patient Instructions (Signed)
Rehydration, Adult Rehydration is the replacement of body fluids, salts, and minerals (electrolytes) that are lost during dehydration. Dehydration is when there is not enough water or other fluids in the body. This happens when you lose more fluids than you take in. Common causes of dehydration include:  Not drinking enough fluids. This can occur when you are ill or doing activities that require a lot of energy, especially in hot weather.  Conditions that cause loss of water or other fluids, such as diarrhea, vomiting, sweating, or urinating a lot.  Other illnesses, such as fever or infection.  Certain medicines, such as those that remove excess fluid from the body (diuretics). Symptoms of mild or moderate dehydration may include thirst, dry lips and mouth, and dizziness. Symptoms of severe dehydration may include increased heart rate, confusion, fainting, and not urinating. For severe dehydration, you may need to get fluids through an IV at the hospital. For mild or moderate dehydration, you can usually rehydrate at home by drinking certain fluids as told by your health care provider. What are the risks? Generally, rehydration is safe. However, taking in too much fluid (overhydration) can be a problem. This is rare. Overhydration can cause an electrolyte imbalance, kidney failure, or a decrease in salt (sodium) levels in the body. Supplies needed You will need an oral rehydration solution (ORS) if your health care provider tells you to use one. This is a drink to treat dehydration. It can be found in pharmacies and retail stores. How to rehydrate Fluids Follow instructions from your health care provider for rehydration. The kind of fluid and the amount you should drink depend on your condition. In general, you should choose drinks that you prefer.  If told by your health care provider, drink an ORS. ? Make an ORS by following instructions on the package. ? Start by drinking small amounts,  about  cup (120 mL) every 5-10 minutes. ? Slowly increase how much you drink until you have taken the amount recommended by your health care provider.  Drink enough clear fluids to keep your urine pale yellow. If you were told to drink an ORS, finish it first, then start slowly drinking other clear fluids. Drink fluids such as: ? Water. This includes sparkling water and flavored water. Drinking only water can lead to having too little sodium in your body (hyponatremia). Follow the advice of your health care provider. ? Water from ice chips you suck on. ? Fruit juice with water you add to it (diluted). ? Sports drinks. ? Hot or cold herbal teas. ? Broth-based soups. ? Milk or milk products. Food Follow instructions from your health care provider about what to eat while you rehydrate. Your health care provider may recommend that you slowly begin eating regular foods in small amounts.  Eat foods that contain a healthy balance of electrolytes, such as bananas, oranges, potatoes, tomatoes, and spinach.  Avoid foods that are greasy or contain a lot of sugar. In some cases, you may get nutrition through a feeding tube that is passed through your nose and into your stomach (nasogastric tube, or NG tube). This may be done if you have uncontrolled vomiting or diarrhea.   Beverages to avoid Certain beverages may make dehydration worse. While you rehydrate, avoid drinking alcohol.   How to tell if you are recovering from dehydration You may be recovering from dehydration if:  You are urinating more often than before you started rehydrating.  Your urine is pale yellow.  Your energy level   improves.  You vomit less frequently.  You have diarrhea less frequently.  Your appetite improves or returns to normal.  You feel less dizzy or less light-headed.  Your skin tone and color start to look more normal. Follow these instructions at home:  Take over-the-counter and prescription medicines only  as told by your health care provider.  Do not take sodium tablets. Doing this can lead to having too much sodium in your body (hypernatremia). Contact a health care provider if:  You continue to have symptoms of mild or moderate dehydration, such as: ? Thirst. ? Dry lips. ? Slightly dry mouth. ? Dizziness. ? Dark urine or less urine than normal. ? Muscle cramps.  You continue to vomit or have diarrhea. Get help right away if you:  Have symptoms of dehydration that get worse.  Have a fever.  Have a severe headache.  Have been vomiting and the following happens: ? Your vomiting gets worse or does not go away. ? Your vomit includes blood or green matter (bile). ? You cannot eat or drink without vomiting.  Have problems with urination or bowel movements, such as: ? Diarrhea that gets worse or does not go away. ? Blood in your stool (feces). This may cause stool to look black and tarry. ? Not urinating, or urinating only a small amount of very dark urine, within 6-8 hours.  Have trouble breathing.  Have symptoms that get worse with treatment. These symptoms may represent a serious problem that is an emergency. Do not wait to see if the symptoms will go away. Get medical help right away. Call your local emergency services (911 in the [place] Do not drive yourself to the hospital. Summary  Rehydration is the replacement of body fluids and minerals (electrolytes) that are lost during dehydration.  Follow instructions from your health care provider for rehydration. The kind of fluid and amount you should drink depend on your condition.  Slowly increase how much you drink until you have taken the amount recommended by your health care provider.  Contact your health care provider if you continue to show signs of mild or moderate dehydration. This information is not intended to replace advice given to you by your health care provider. Make sure you discuss any questions you have with  your health care provider. Document Revised: 03/05/2019 Document Reviewed: 01/13/2019 Elsevier Patient Education  2021 [employer]vier Inc.  

## 2020-03-31 ENCOUNTER — Other Ambulatory Visit: Payer: 59

## 2020-03-31 ENCOUNTER — Ambulatory Visit: Payer: 59

## 2020-03-31 ENCOUNTER — Ambulatory Visit: Payer: 59 | Admitting: Hematology

## 2020-04-01 ENCOUNTER — Other Ambulatory Visit: Payer: Self-pay

## 2020-04-01 ENCOUNTER — Inpatient Hospital Stay: Payer: 59 | Admitting: Hematology

## 2020-04-01 ENCOUNTER — Inpatient Hospital Stay: Payer: 59

## 2020-04-01 VITALS — BP 107/70 | HR 80

## 2020-04-01 DIAGNOSIS — C50411 Malignant neoplasm of upper-outer quadrant of right female breast: Secondary | ICD-10-CM

## 2020-04-01 DIAGNOSIS — Z5111 Encounter for antineoplastic chemotherapy: Secondary | ICD-10-CM | POA: Diagnosis not present

## 2020-04-01 MED ORDER — GOSERELIN ACETATE 3.6 MG ~~LOC~~ IMPL
DRUG_IMPLANT | SUBCUTANEOUS | Status: AC
  Filled 2020-04-01: qty 3.6

## 2020-04-01 MED ORDER — GOSERELIN ACETATE 3.6 MG ~~LOC~~ IMPL
3.6000 mg | DRUG_IMPLANT | Freq: Once | SUBCUTANEOUS | Status: AC
  Administered 2020-04-01: 3.6 mg via SUBCUTANEOUS

## 2020-04-03 ENCOUNTER — Other Ambulatory Visit: Payer: Self-pay | Admitting: Hematology

## 2020-04-05 ENCOUNTER — Other Ambulatory Visit: Payer: Self-pay | Admitting: Hematology

## 2020-04-09 ENCOUNTER — Other Ambulatory Visit: Payer: Self-pay

## 2020-04-09 ENCOUNTER — Encounter (HOSPITAL_BASED_OUTPATIENT_CLINIC_OR_DEPARTMENT_OTHER): Payer: Self-pay | Admitting: Surgery

## 2020-04-12 ENCOUNTER — Other Ambulatory Visit (HOSPITAL_COMMUNITY)
Admission: RE | Admit: 2020-04-12 | Discharge: 2020-04-12 | Disposition: A | Discharge status: Home / Self Care | Payer: 59 | Source: Ambulatory Visit | Attending: Surgery | Admitting: Surgery

## 2020-04-12 DIAGNOSIS — Z20822 Contact with and (suspected) exposure to covid-19: Secondary | ICD-10-CM | POA: Diagnosis not present

## 2020-04-12 DIAGNOSIS — Z01812 Encounter for preprocedural laboratory examination: Secondary | ICD-10-CM | POA: Diagnosis present

## 2020-04-12 LAB — SARS CORONAVIRUS 2 (TAT 6-24 HRS): SARS Coronavirus 2: NEGATIVE

## 2020-04-12 NOTE — Progress Notes (Signed)

## 2020-04-14 ENCOUNTER — Other Ambulatory Visit: Payer: Self-pay | Admitting: Surgery

## 2020-04-14 ENCOUNTER — Other Ambulatory Visit: Payer: Self-pay

## 2020-04-14 ENCOUNTER — Ambulatory Visit
Admission: RE | Admit: 2020-04-14 | Discharge: 2020-04-14 | Disposition: A | Discharge status: Home / Self Care | Payer: 59 | Source: Ambulatory Visit | Attending: Surgery | Admitting: Surgery

## 2020-04-14 DIAGNOSIS — C50911 Malignant neoplasm of unspecified site of right female breast: Secondary | ICD-10-CM

## 2020-04-14 IMAGING — US US NEEDLE LOCALIZATION*R*
1 series · 4 of 4 positions shown · non-contrast
Comparison: Previous exam(s).

CLINICAL DATA: 34-year-old female for radioactive seed bracketing
of UPPER-OUTER RIGHT breast cancer and radioactive seed localization
of RIGHT axillary lymph node metastasis.

EXAM:
ULTRASOUND-GUIDED RADIOACTIVE SEED LOCALIZATION OF THE RIGHT AXILLA
MAMMOGRAPHIC GUIDED RADIOACTIVE SEED LOCALIZATION OF THE RIGHT
BREAST X 2

[Series 1: us needle localization*right* · 0.06mm/px · 4 of 4 slices shown]
[im 1/4]
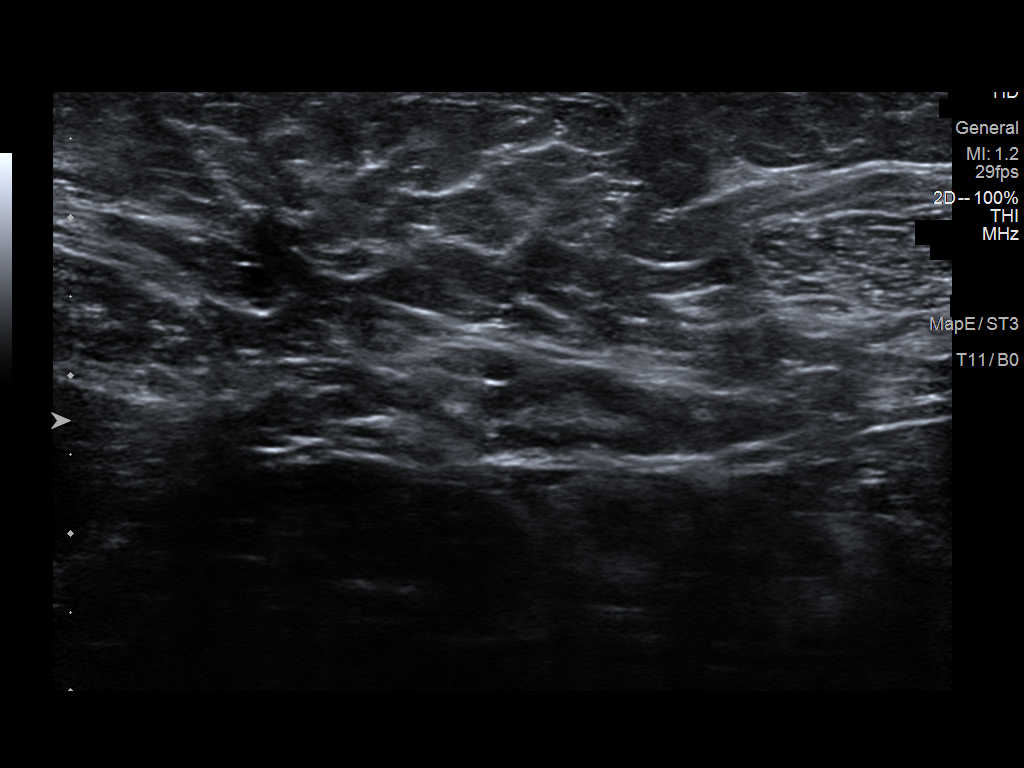
[im 2/4]
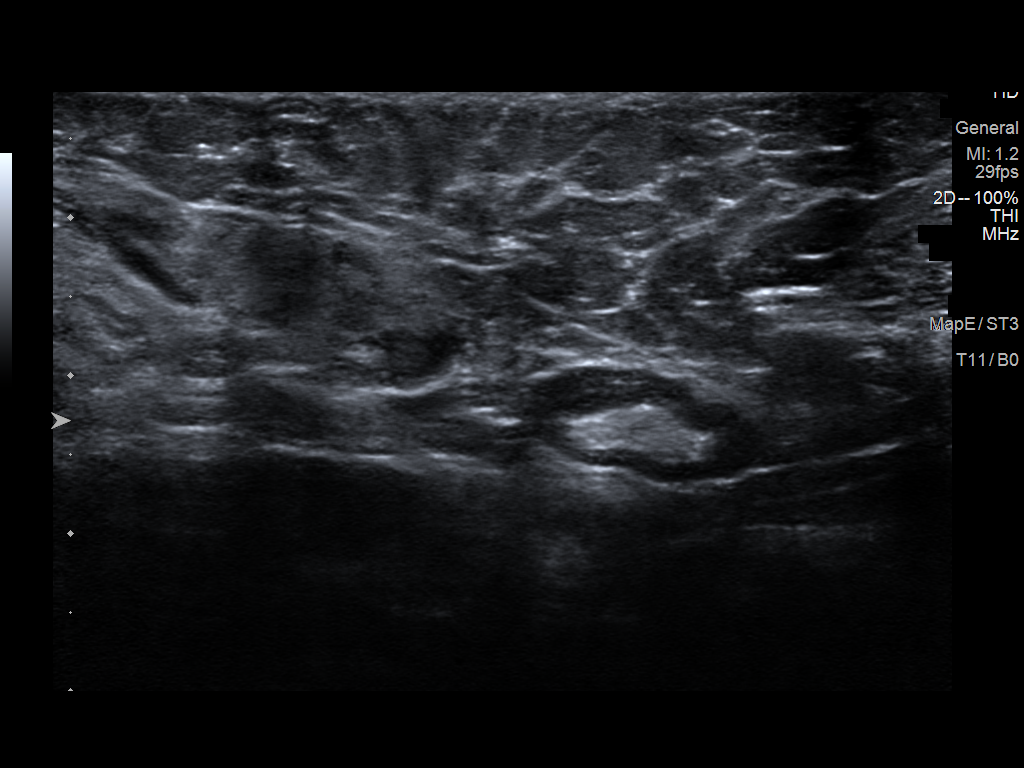
[im 3/4]
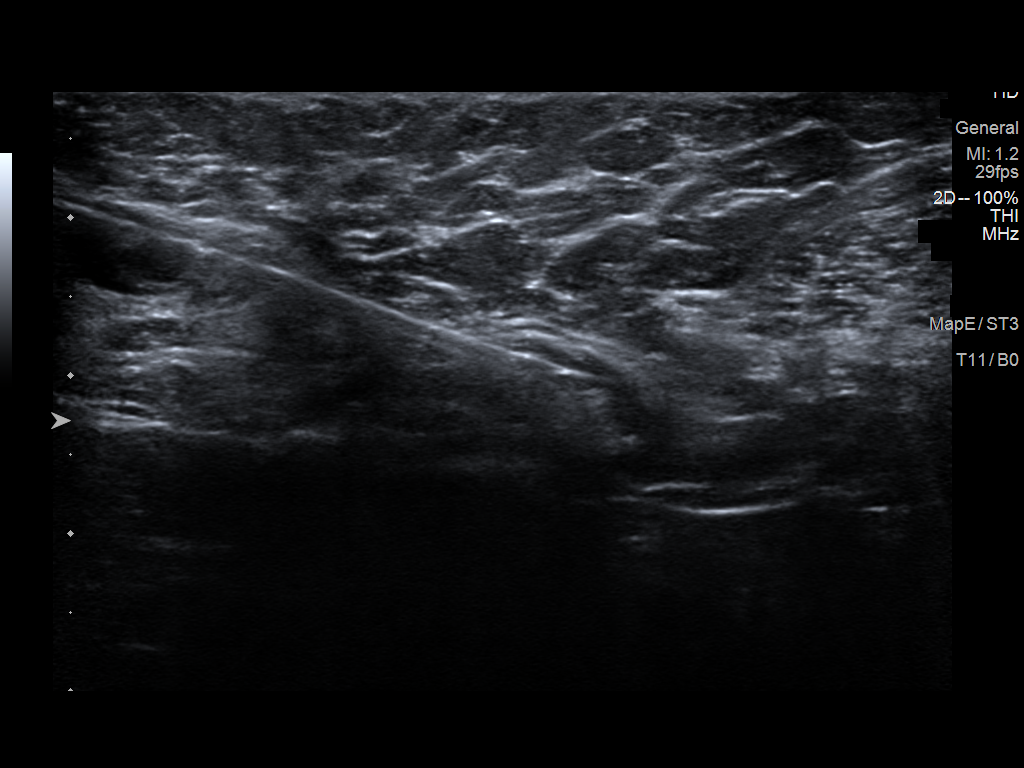
[im 4/4]
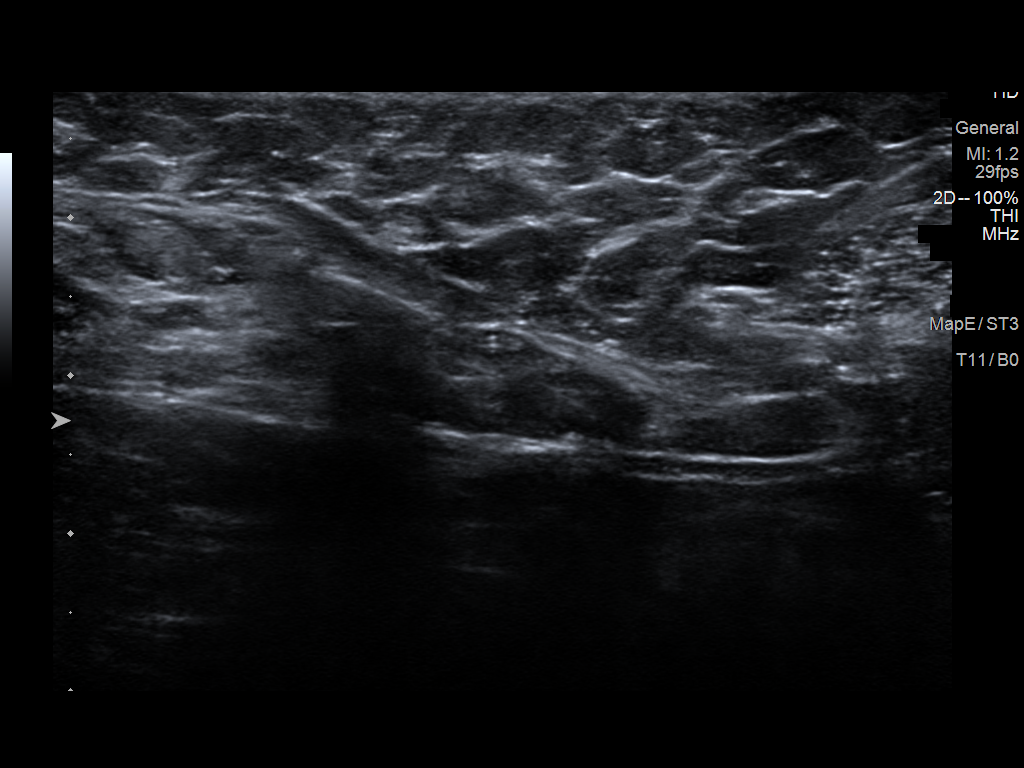

[4 of 4 positions shown; findings below may reference images not displayed]

FINDINGS: Patient presents for radioactive seed localization prior to RIGHT
lumpectomy and targeted RIGHT axillary lymph node dissection. I met
with the patient and we discussed the procedure of seed localization
including benefits and alternatives. We discussed the high
likelihood of successful procedures. We discussed the risks of the
procedure including infection, bleeding, tissue injury and further
surgery. We discussed the low dose of radioactivity involved in the
procedure. Informed, written consent was given.

The usual time-out protocol was performed immediately prior to the
procedures.

ULTRASOUND-GUIDED RADIOACTIVE SEED LOCALIZATION OF THE RIGHT AXILLA:

Using ultrasound guidance, sterile technique, 1% lidocaine and an
[GY] radioactive seed, the RIGHT axillary lymph node containing
biopsy clip changes was localized using a LATERAL approach.
Satisfactory seed position was confirmed sonographically as this
lymph node is not able to be visualized mammographically due to
posterior position.

Follow-up survey of the patient confirms presence of the radioactive
seed.

Order number of [GY] seed:  [PHONE_NUMBER].

Total activity:  0.250 millicuries.  Reference Date: [DATE]

MAMMOGRAPHIC GUIDED RADIOACTIVE SEED LOCALIZATION OF THE RIGHT
BREAST X 2

Using mammographic guidance, sterile technique, 1% lidocaine and an
[GY] radioactive seed, the RIBBON/COIL biopsy clips and
calcifications located 2.5 cm inferiorly were bracketed using a
LATERAL approach. The follow-up mammogram images confirm the seeds
in the expected location and were marked for Dr. FLETCHER. The SUPERIOR
seed lies between the RIBBON and COIL clip in the transverse
dimension in the INFERIOR seed lies adjacent to calcifications. The
distance between the 2 seeds measure 3.5 cm.

Follow-up survey of the patient confirms presence of the radioactive
seeds.

Seed 1: Order number of [GY] seed:  [PHONE_NUMBER].

Total activity:  0.242 millicuries.  Reference Date: [DATE].

Seed 2: Order number of [GY] seed:  [PHONE_NUMBER].

Total activity:  0.242 millicuries.  Reference Date: [DATE].

The patient tolerated the procedures well and was released from the
[REDACTED]. She was given instructions regarding seed removal.
IMPRESSION: Radioactive seed bracketing of the RIGHT breast malignancy as
described above. The distance between the 2 bracketing seeds
measures 3.5 cm.

Radioactive seed localization of the biopsied RIGHT axillary lymph
node.

No apparent complications.

## 2020-04-14 IMAGING — MG MM PLC BREAST LOC DEV EA ADD LEASION INC MAMMO GUIDE*R*
8 of 11 series · 8 of 11 positions shown · non-contrast
Comparison: Previous exam(s).

CLINICAL DATA: 34-year-old female for radioactive seed bracketing
of UPPER-OUTER RIGHT breast cancer and radioactive seed localization
of RIGHT axillary lymph node metastasis.

EXAM:
ULTRASOUND-GUIDED RADIOACTIVE SEED LOCALIZATION OF THE RIGHT AXILLA
MAMMOGRAPHIC GUIDED RADIOACTIVE SEED LOCALIZATION OF THE RIGHT
BREAST X 2

[R LM (1 of 4)]
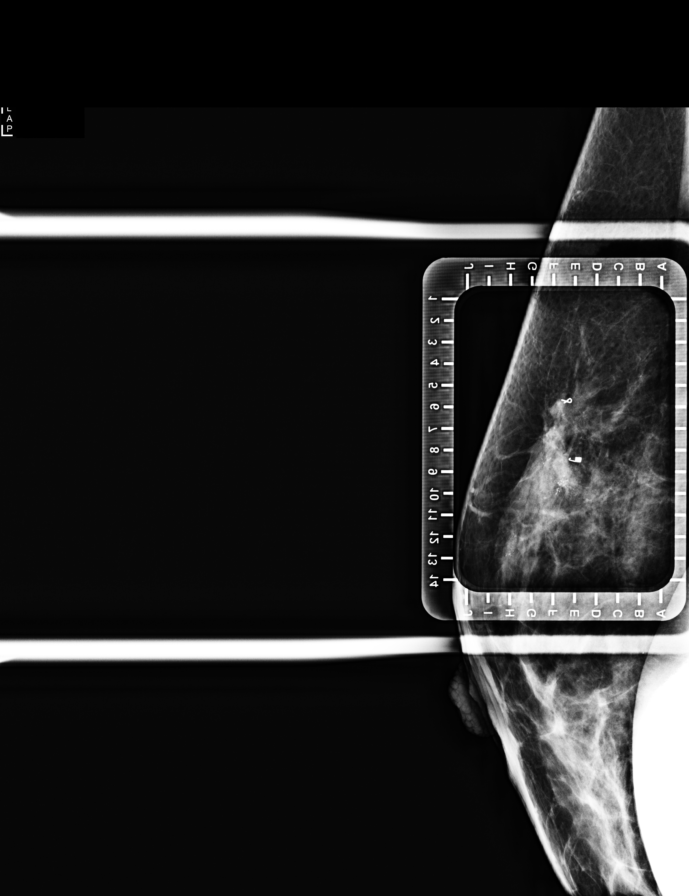

[R CC (1 of 4)]
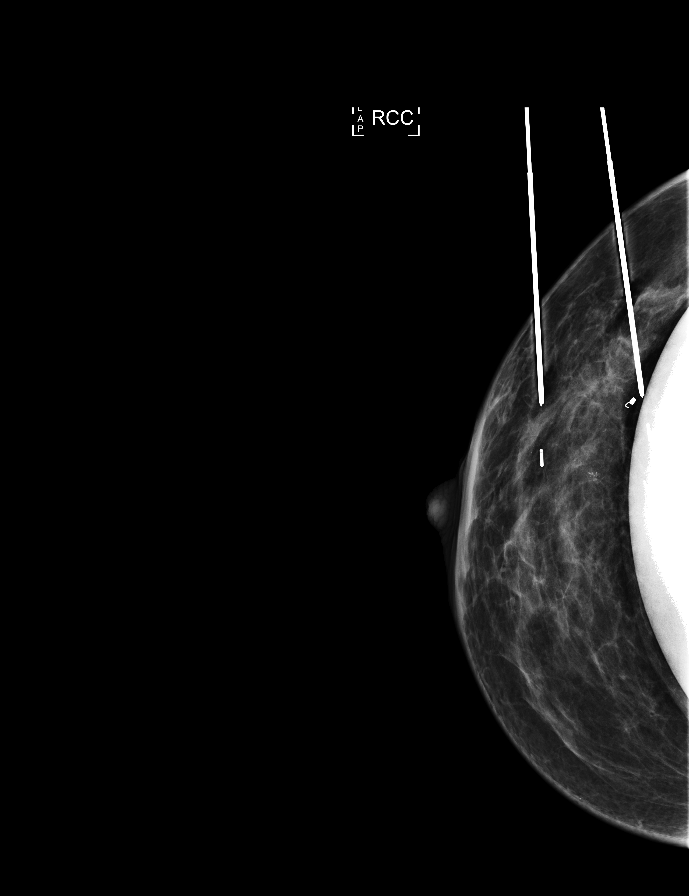

[R LM (2 of 4)]
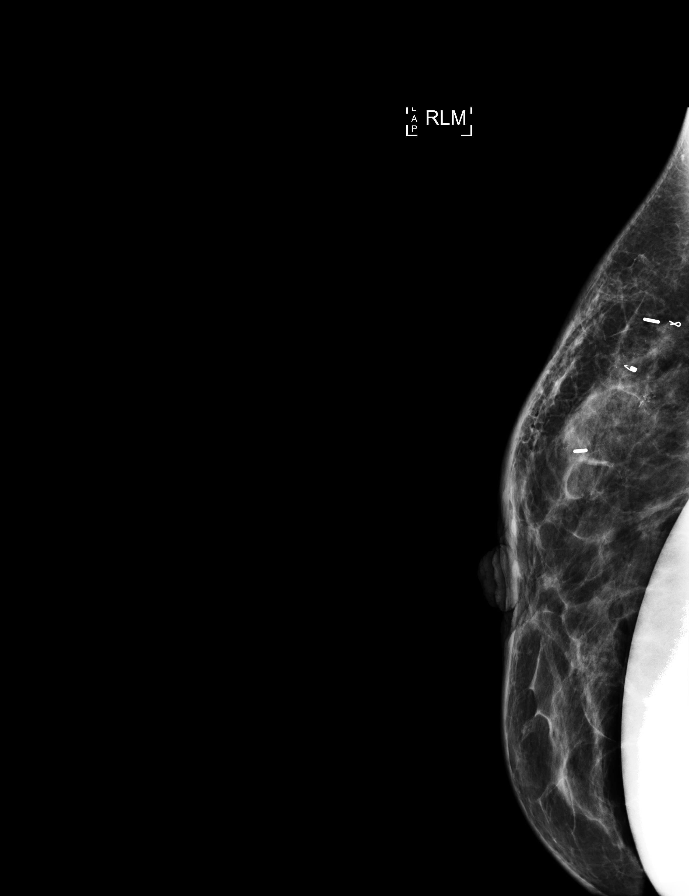

[R LM (3 of 4)]
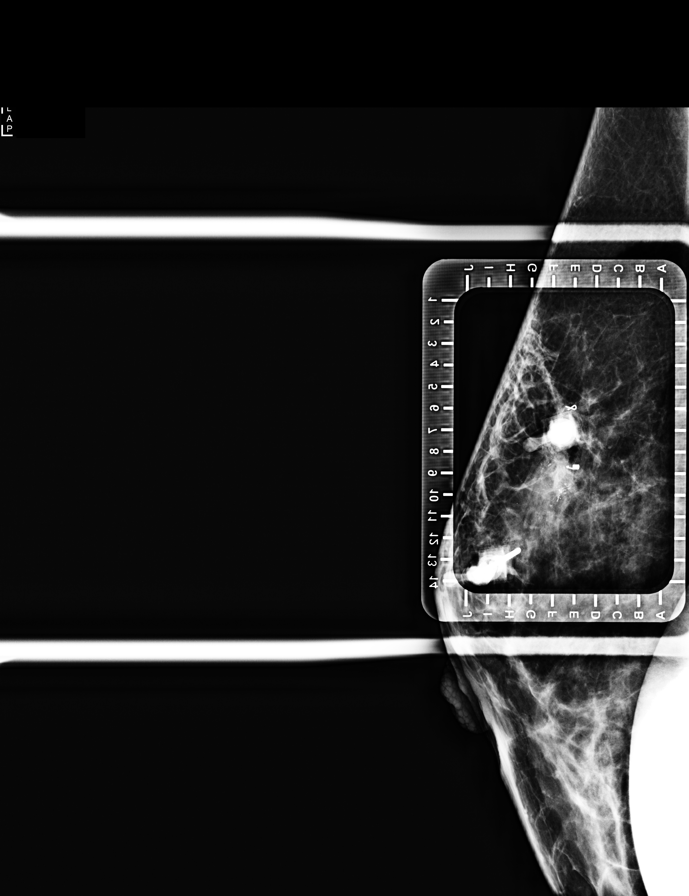

[R CC (2 of 4)]
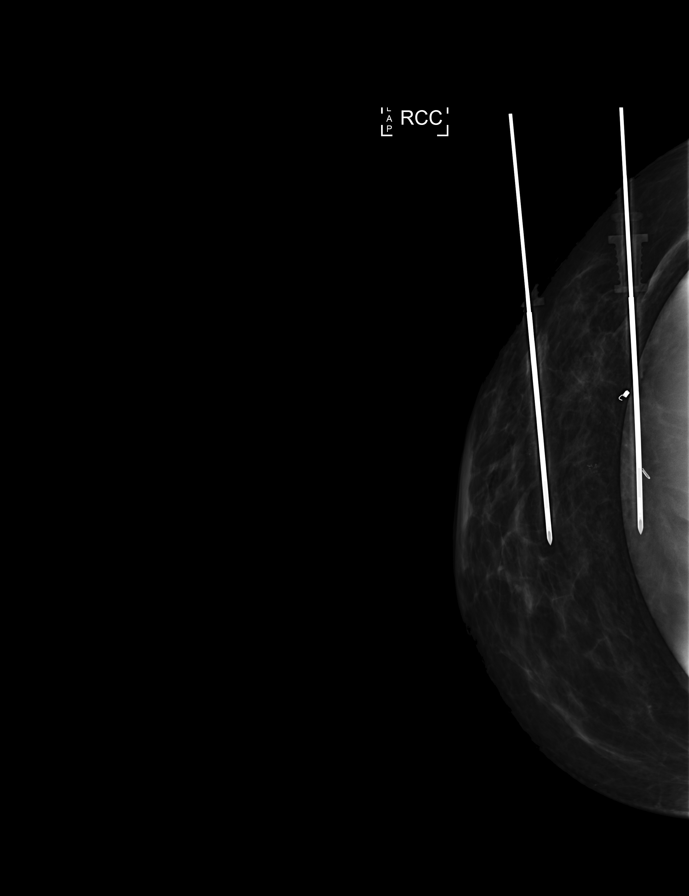

[R CC (3 of 4)]
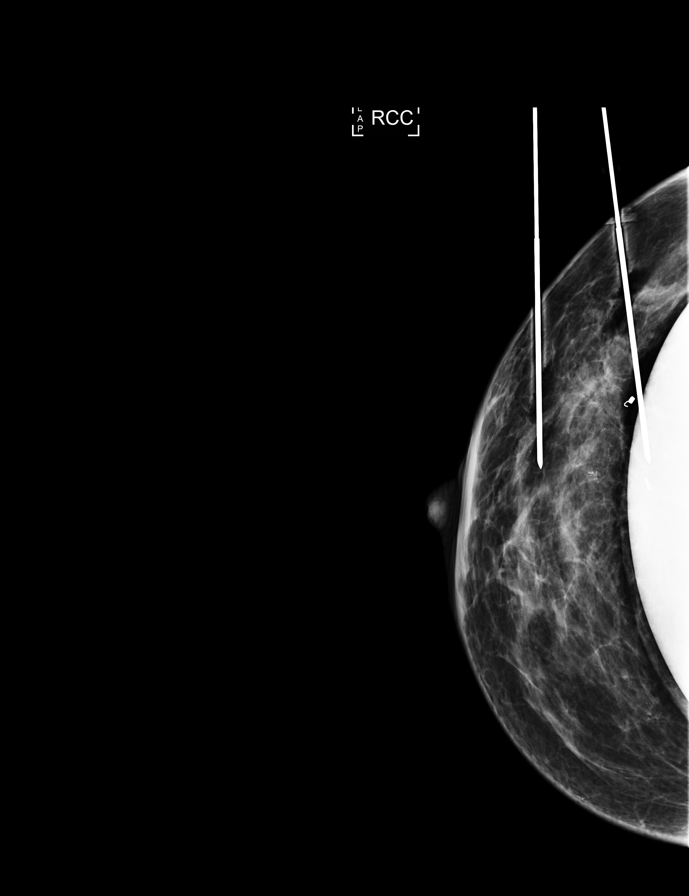

[R CC (4 of 4)]
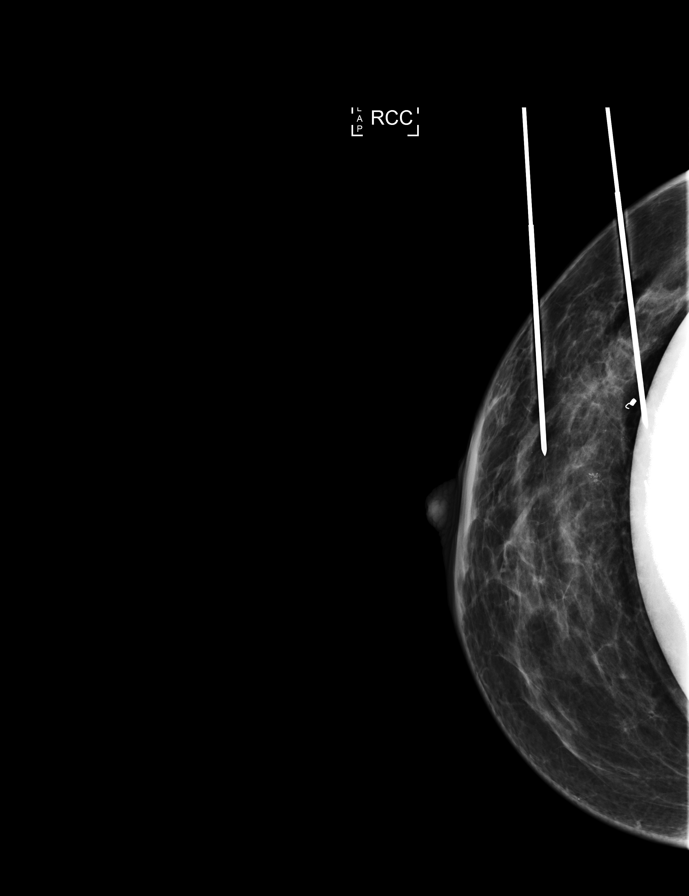

[R LM (4 of 4)]
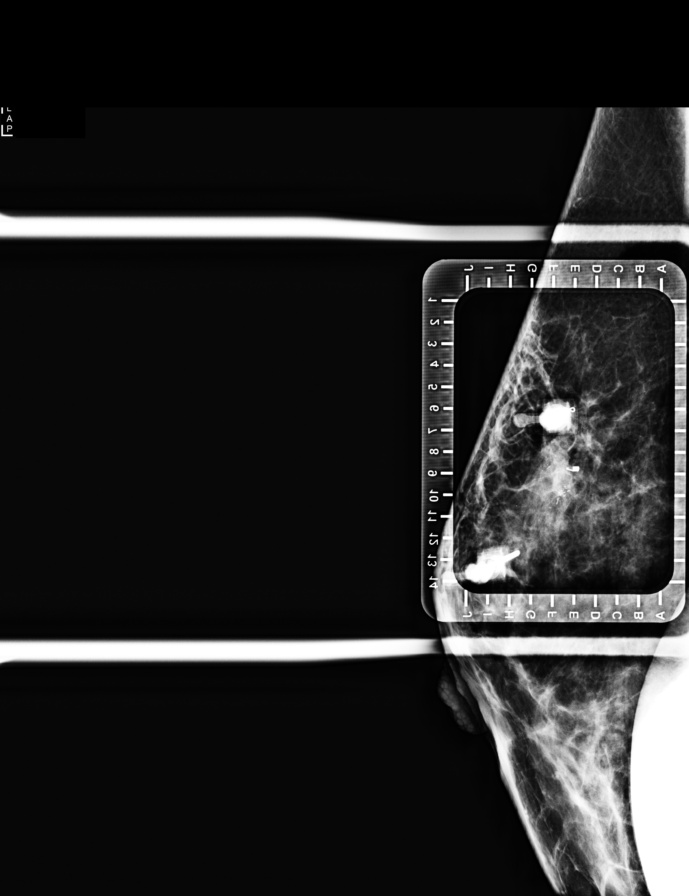

[8 of 11 positions shown; findings below may reference images not displayed]

FINDINGS: Patient presents for radioactive seed localization prior to RIGHT
lumpectomy and targeted RIGHT axillary lymph node dissection. I met
with the patient and we discussed the procedure of seed localization
including benefits and alternatives. We discussed the high
likelihood of successful procedures. We discussed the risks of the
procedure including infection, bleeding, tissue injury and further
surgery. We discussed the low dose of radioactivity involved in the
procedure. Informed, written consent was given.

The usual time-out protocol was performed immediately prior to the
procedures.

ULTRASOUND-GUIDED RADIOACTIVE SEED LOCALIZATION OF THE RIGHT AXILLA:

Using ultrasound guidance, sterile technique, 1% lidocaine and an
[GY] radioactive seed, the RIGHT axillary lymph node containing
biopsy clip changes was localized using a LATERAL approach.
Satisfactory seed position was confirmed sonographically as this
lymph node is not able to be visualized mammographically due to
posterior position.

Follow-up survey of the patient confirms presence of the radioactive
seed.

Order number of [GY] seed:  [PHONE_NUMBER].

Total activity:  0.250 millicuries.  Reference Date: [DATE]

MAMMOGRAPHIC GUIDED RADIOACTIVE SEED LOCALIZATION OF THE RIGHT
BREAST X 2

Using mammographic guidance, sterile technique, 1% lidocaine and an
[GY] radioactive seed, the RIBBON/COIL biopsy clips and
calcifications located 2.5 cm inferiorly were bracketed using a
LATERAL approach. The follow-up mammogram images confirm the seeds
in the expected location and were marked for Dr. FLETCHER. The SUPERIOR
seed lies between the RIBBON and COIL clip in the transverse
dimension in the INFERIOR seed lies adjacent to calcifications. The
distance between the 2 seeds measure 3.5 cm.

Follow-up survey of the patient confirms presence of the radioactive
seeds.

Seed 1: Order number of [GY] seed:  [PHONE_NUMBER].

Total activity:  0.242 millicuries.  Reference Date: [DATE].

Seed 2: Order number of [GY] seed:  [PHONE_NUMBER].

Total activity:  0.242 millicuries.  Reference Date: [DATE].

The patient tolerated the procedures well and was released from the
[REDACTED]. She was given instructions regarding seed removal.
IMPRESSION: Radioactive seed bracketing of the RIGHT breast malignancy as
described above. The distance between the 2 bracketing seeds
measures 3.5 cm.

Radioactive seed localization of the biopsied RIGHT axillary lymph
node.

No apparent complications.

## 2020-04-14 MED FILL — ANASTROZOLE 1 MG TABLET: 1 | 30 days supply | Qty: 30 | Fill #3

## 2020-04-15 ENCOUNTER — Ambulatory Visit (HOSPITAL_BASED_OUTPATIENT_CLINIC_OR_DEPARTMENT_OTHER): Payer: 59 | Admitting: Anesthesiology

## 2020-04-15 ENCOUNTER — Other Ambulatory Visit: Payer: 59

## 2020-04-15 ENCOUNTER — Ambulatory Visit: Payer: 59 | Admitting: Hematology

## 2020-04-15 ENCOUNTER — Encounter (HOSPITAL_BASED_OUTPATIENT_CLINIC_OR_DEPARTMENT_OTHER)
Admission: RE | Disposition: A | Discharge status: Home / Self Care | Payer: Self-pay | Source: Home / Self Care | Attending: Surgery

## 2020-04-15 ENCOUNTER — Ambulatory Visit (HOSPITAL_COMMUNITY)
Admission: RE | Admit: 2020-04-15 | Discharge: 2020-04-15 | Disposition: A | Discharge status: Home / Self Care | Payer: 59 | Source: Ambulatory Visit | Attending: Surgery | Admitting: Surgery

## 2020-04-15 ENCOUNTER — Encounter (HOSPITAL_BASED_OUTPATIENT_CLINIC_OR_DEPARTMENT_OTHER): Payer: Self-pay | Admitting: Surgery

## 2020-04-15 ENCOUNTER — Ambulatory Visit
Admission: RE | Admit: 2020-04-15 | Discharge: 2020-04-15 | Disposition: A | Discharge status: Home / Self Care | Payer: 59 | Source: Ambulatory Visit | Attending: Surgery | Admitting: Surgery

## 2020-04-15 ENCOUNTER — Ambulatory Visit (HOSPITAL_BASED_OUTPATIENT_CLINIC_OR_DEPARTMENT_OTHER)
Admission: RE | Admit: 2020-04-15 | Discharge: 2020-04-15 | Disposition: A | Discharge status: Home / Self Care | Payer: 59 | Attending: Surgery | Admitting: Surgery

## 2020-04-15 ENCOUNTER — Other Ambulatory Visit: Payer: Self-pay | Admitting: Surgery

## 2020-04-15 DIAGNOSIS — C50911 Malignant neoplasm of unspecified site of right female breast: Secondary | ICD-10-CM

## 2020-04-15 DIAGNOSIS — Z79811 Long term (current) use of aromatase inhibitors: Secondary | ICD-10-CM | POA: Diagnosis not present

## 2020-04-15 DIAGNOSIS — Z170 Estrogen receptor positive status [ER+]: Secondary | ICD-10-CM | POA: Insufficient documentation

## 2020-04-15 DIAGNOSIS — Z79899 Other long term (current) drug therapy: Secondary | ICD-10-CM | POA: Insufficient documentation

## 2020-04-15 DIAGNOSIS — Z9882 Breast implant status: Secondary | ICD-10-CM | POA: Insufficient documentation

## 2020-04-15 DIAGNOSIS — C50411 Malignant neoplasm of upper-outer quadrant of right female breast: Secondary | ICD-10-CM | POA: Diagnosis not present

## 2020-04-15 DIAGNOSIS — C773 Secondary and unspecified malignant neoplasm of axilla and upper limb lymph nodes: Secondary | ICD-10-CM | POA: Insufficient documentation

## 2020-04-15 HISTORY — PX: RADIOACTIVE SEED GUIDED AXILLARY SENTINEL LYMPH NODE: SHX6735

## 2020-04-15 HISTORY — PX: BREAST LUMPECTOMY WITH RADIOACTIVE SEED LOCALIZATION: SHX6424

## 2020-04-15 HISTORY — PX: SENTINEL NODE BIOPSY: SHX6608

## 2020-04-15 LAB — POCT PREGNANCY, URINE: Preg Test, Ur: NEGATIVE

## 2020-04-15 IMAGING — DX MM BREAST SURGICAL SPECIMEN
1 series · 2 of 2 positions shown · non-contrast
Comparison: Previous exam(s).

CLINICAL DATA: Status post radioactive seed bracketed lumpectomy of
the RIGHT breast.

EXAM:
SPECIMEN RADIOGRAPH OF THE RIGHT BREAST

[Series 1: specimen digital x-ray · right · 0.07mm/px · 2 of 2 slices shown]
[im 1/2]
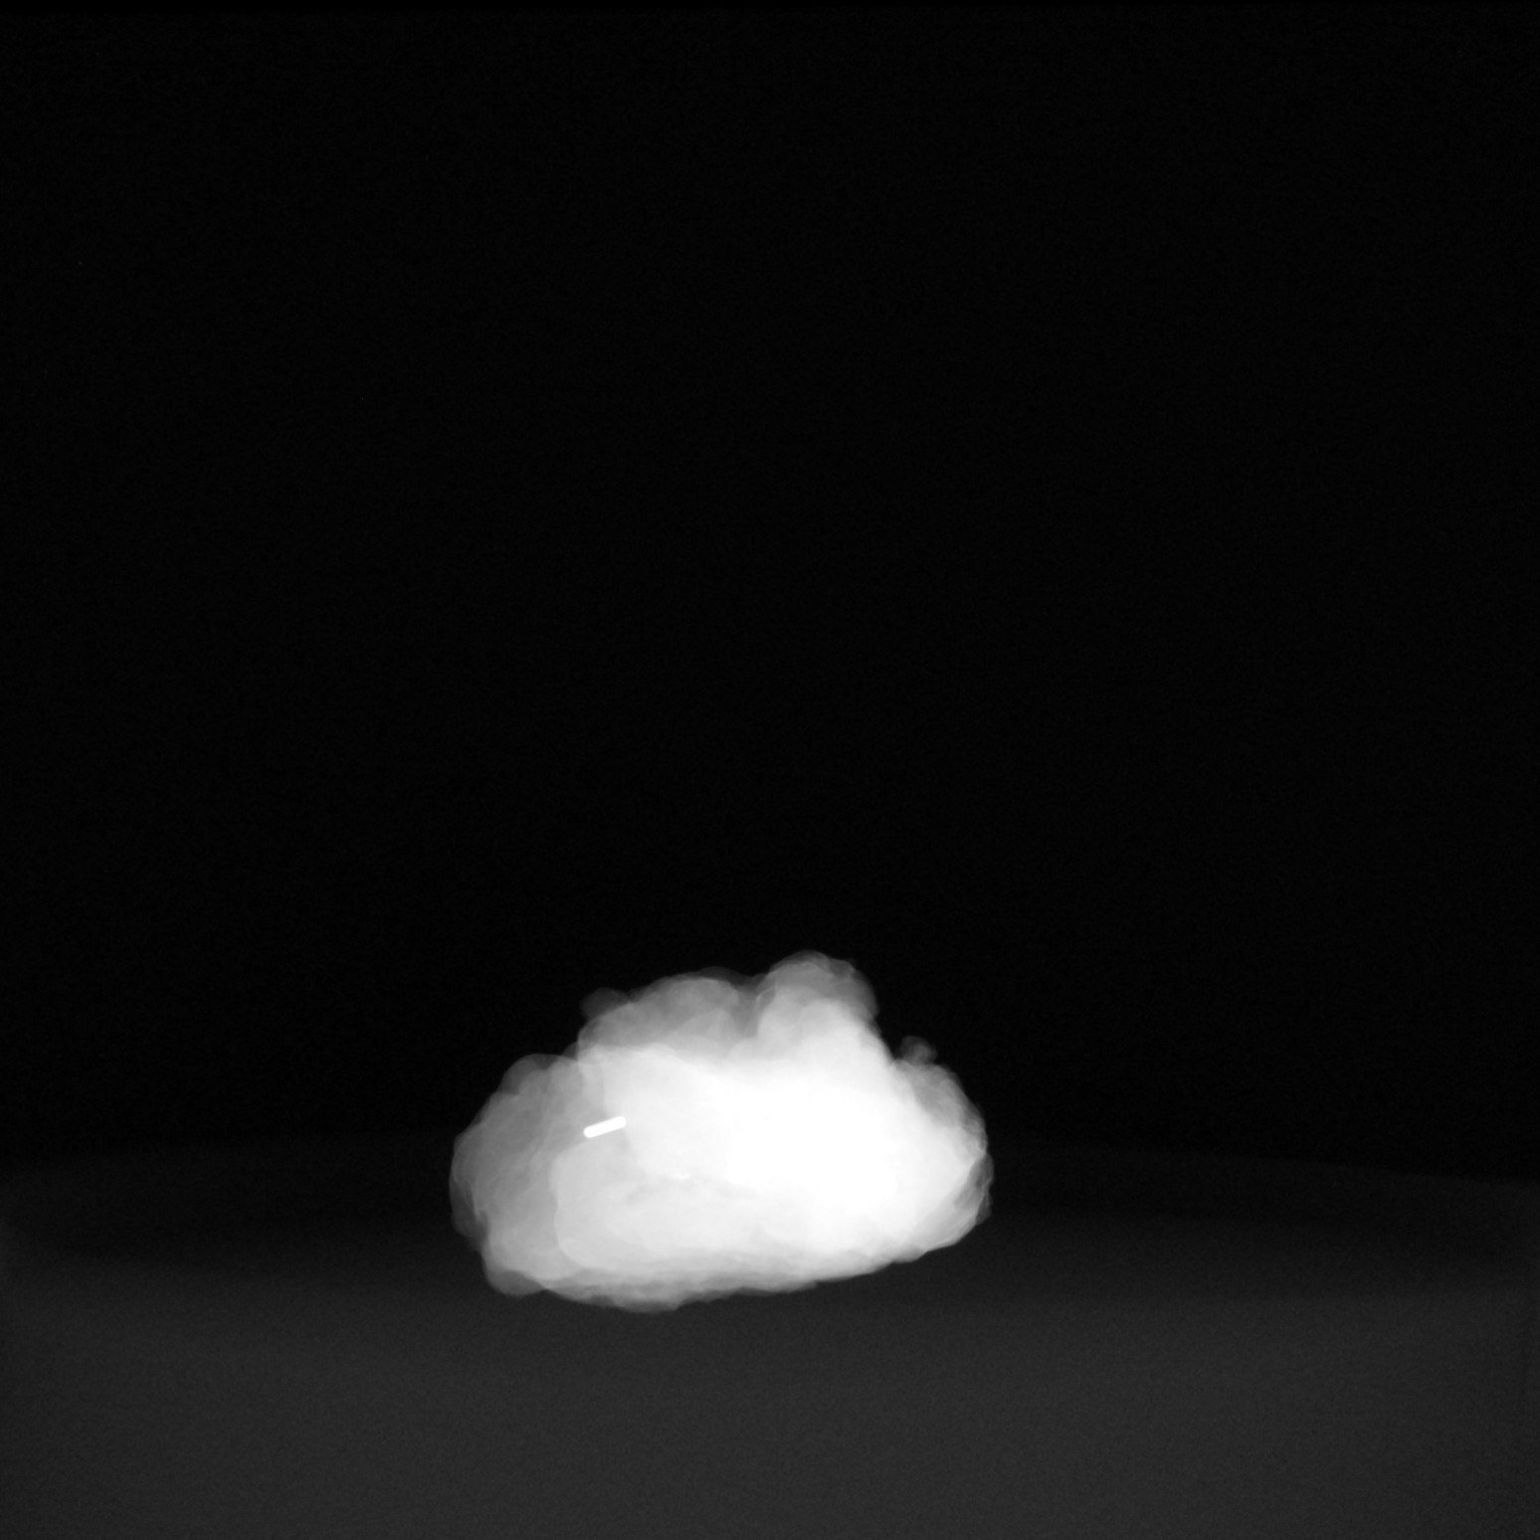
[im 2/2]
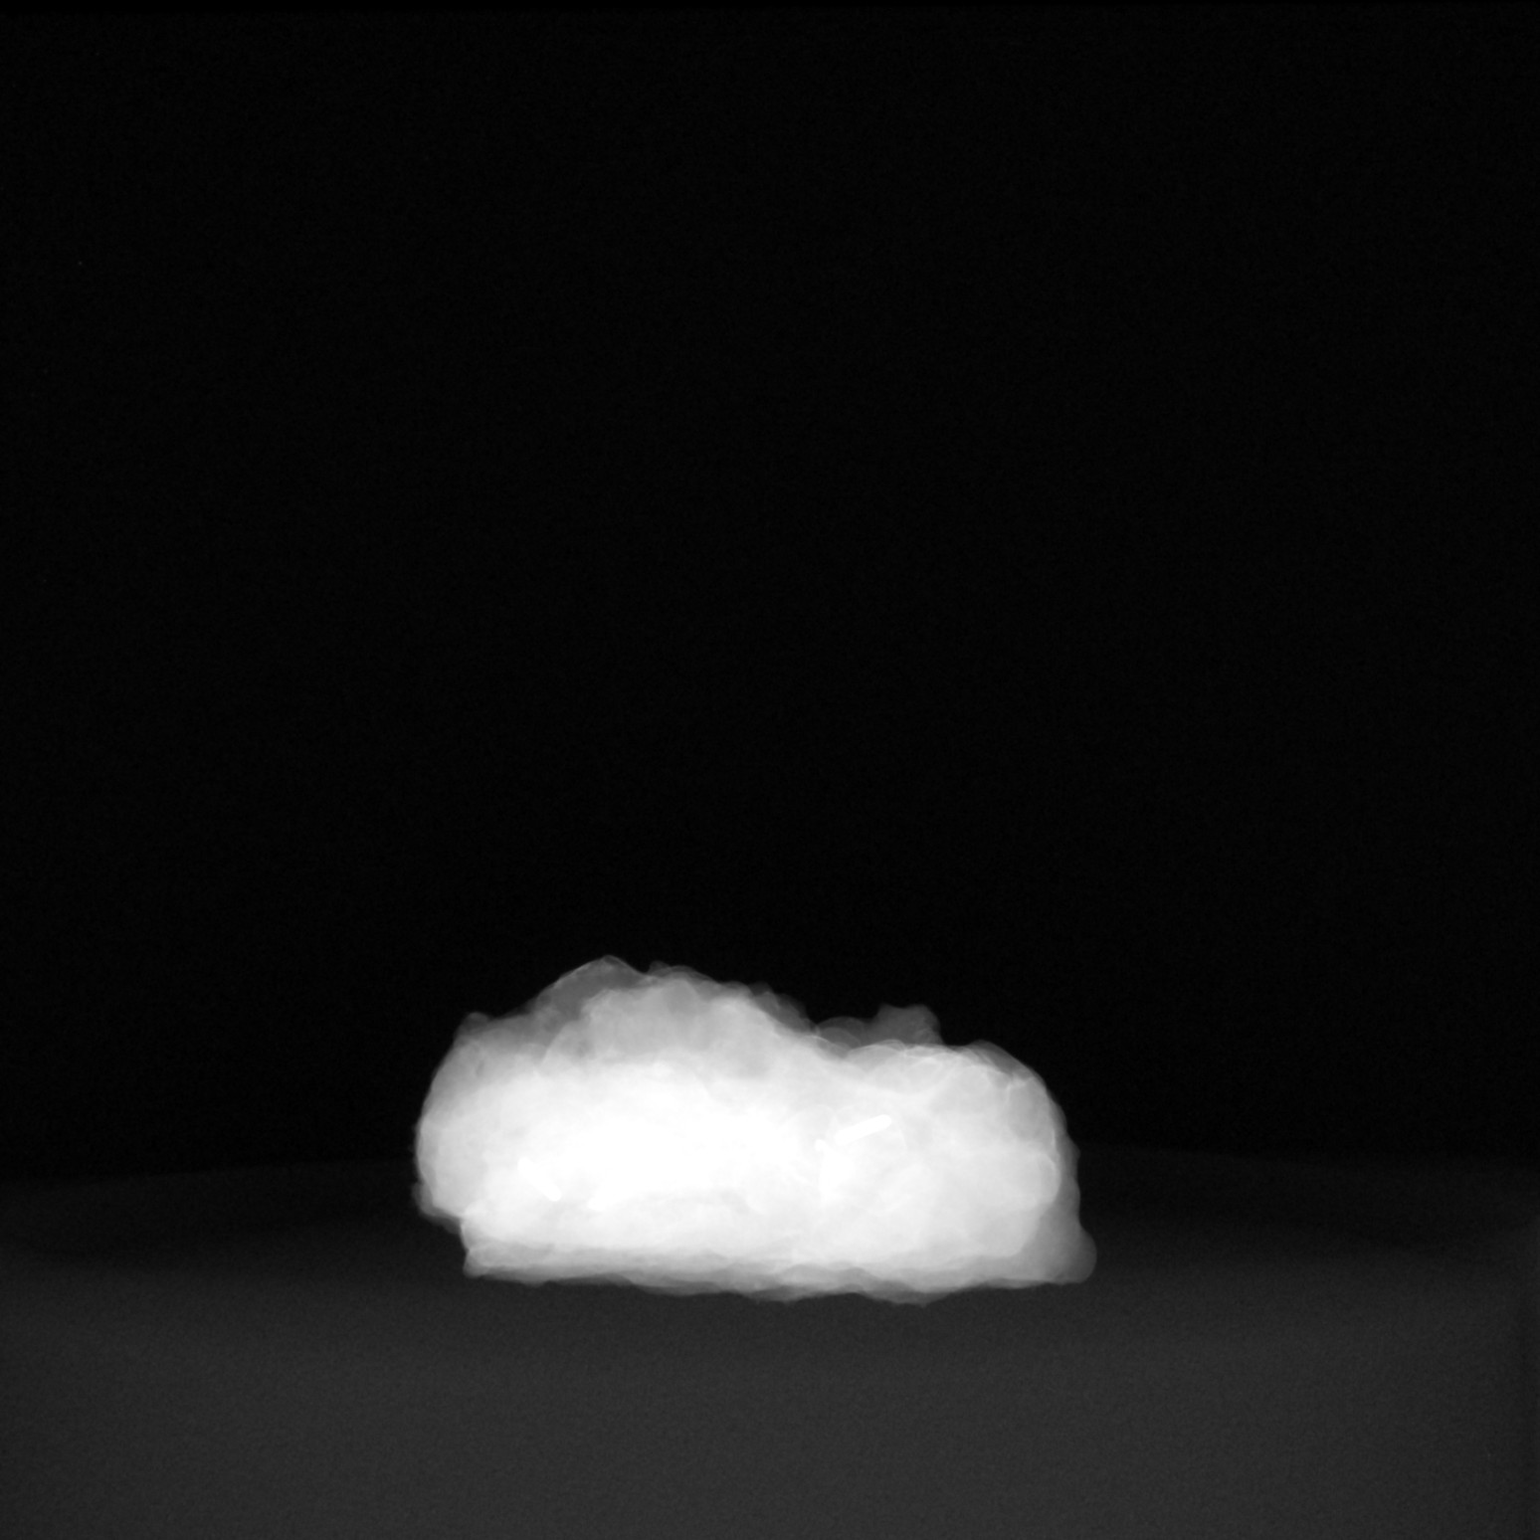

[2 of 2 positions shown; findings below may reference images not displayed]

FINDINGS: Status post excision of the right breast. Two radioactive seeds and
a coil shaped clip are present within the specimen and are
completely intact. The findings are discussed with the operating
room nurse at the time of interpretation.
IMPRESSION: Specimen radiograph of the right breast.

## 2020-04-15 IMAGING — MG MM BREAST SURGICAL SPECIMEN
1 series · 2 of 2 positions shown · non-contrast
Comparison: Previous exam(s).

CLINICAL DATA: Specimen radiograph status post targeted lymph node
dissection.

EXAM:
SPECIMEN RADIOGRAPH OF THE RIGHT AXILLA

[Series 1: R · right · 0.07mm/px · 2 of 2 slices shown]
[im 1/2]
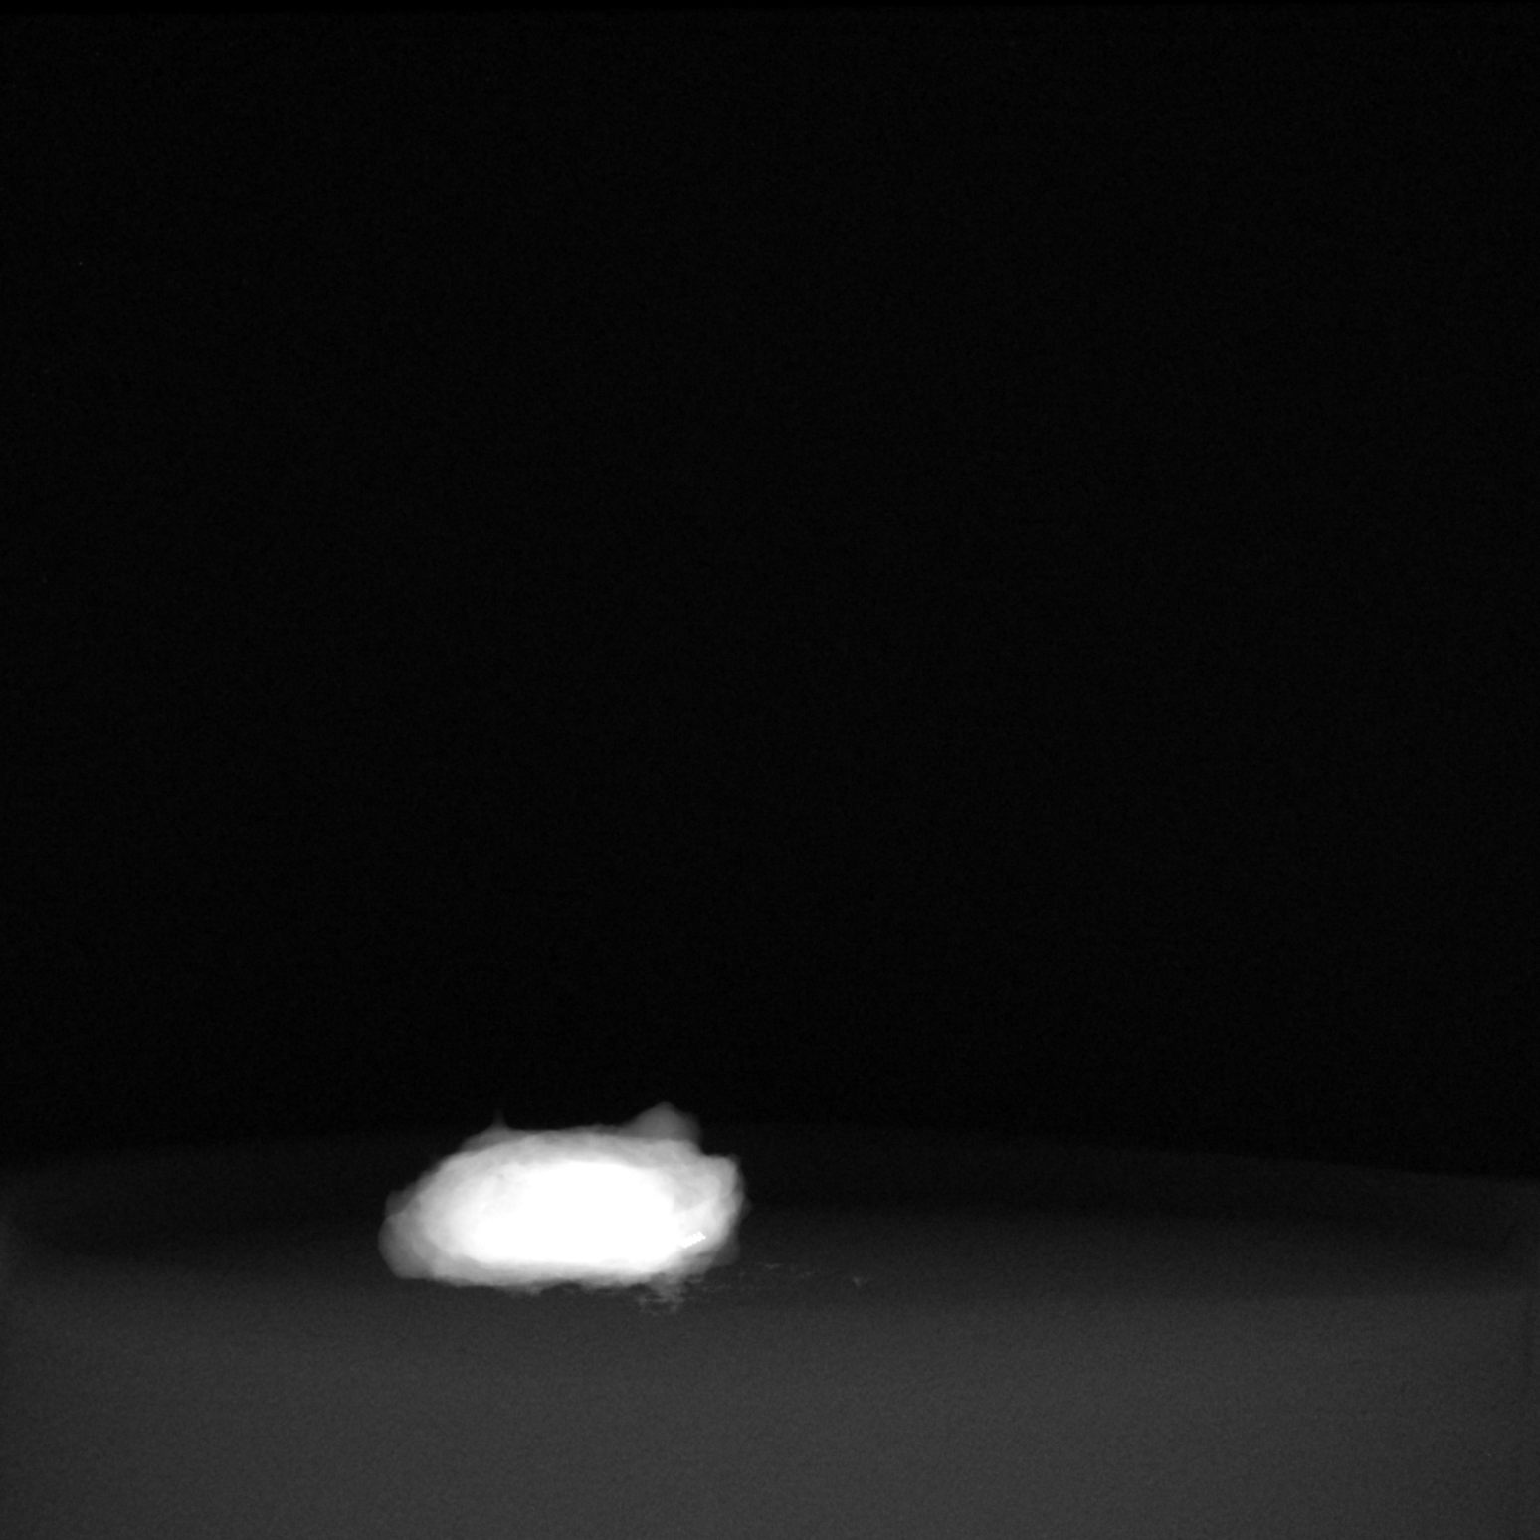
[im 2/2]
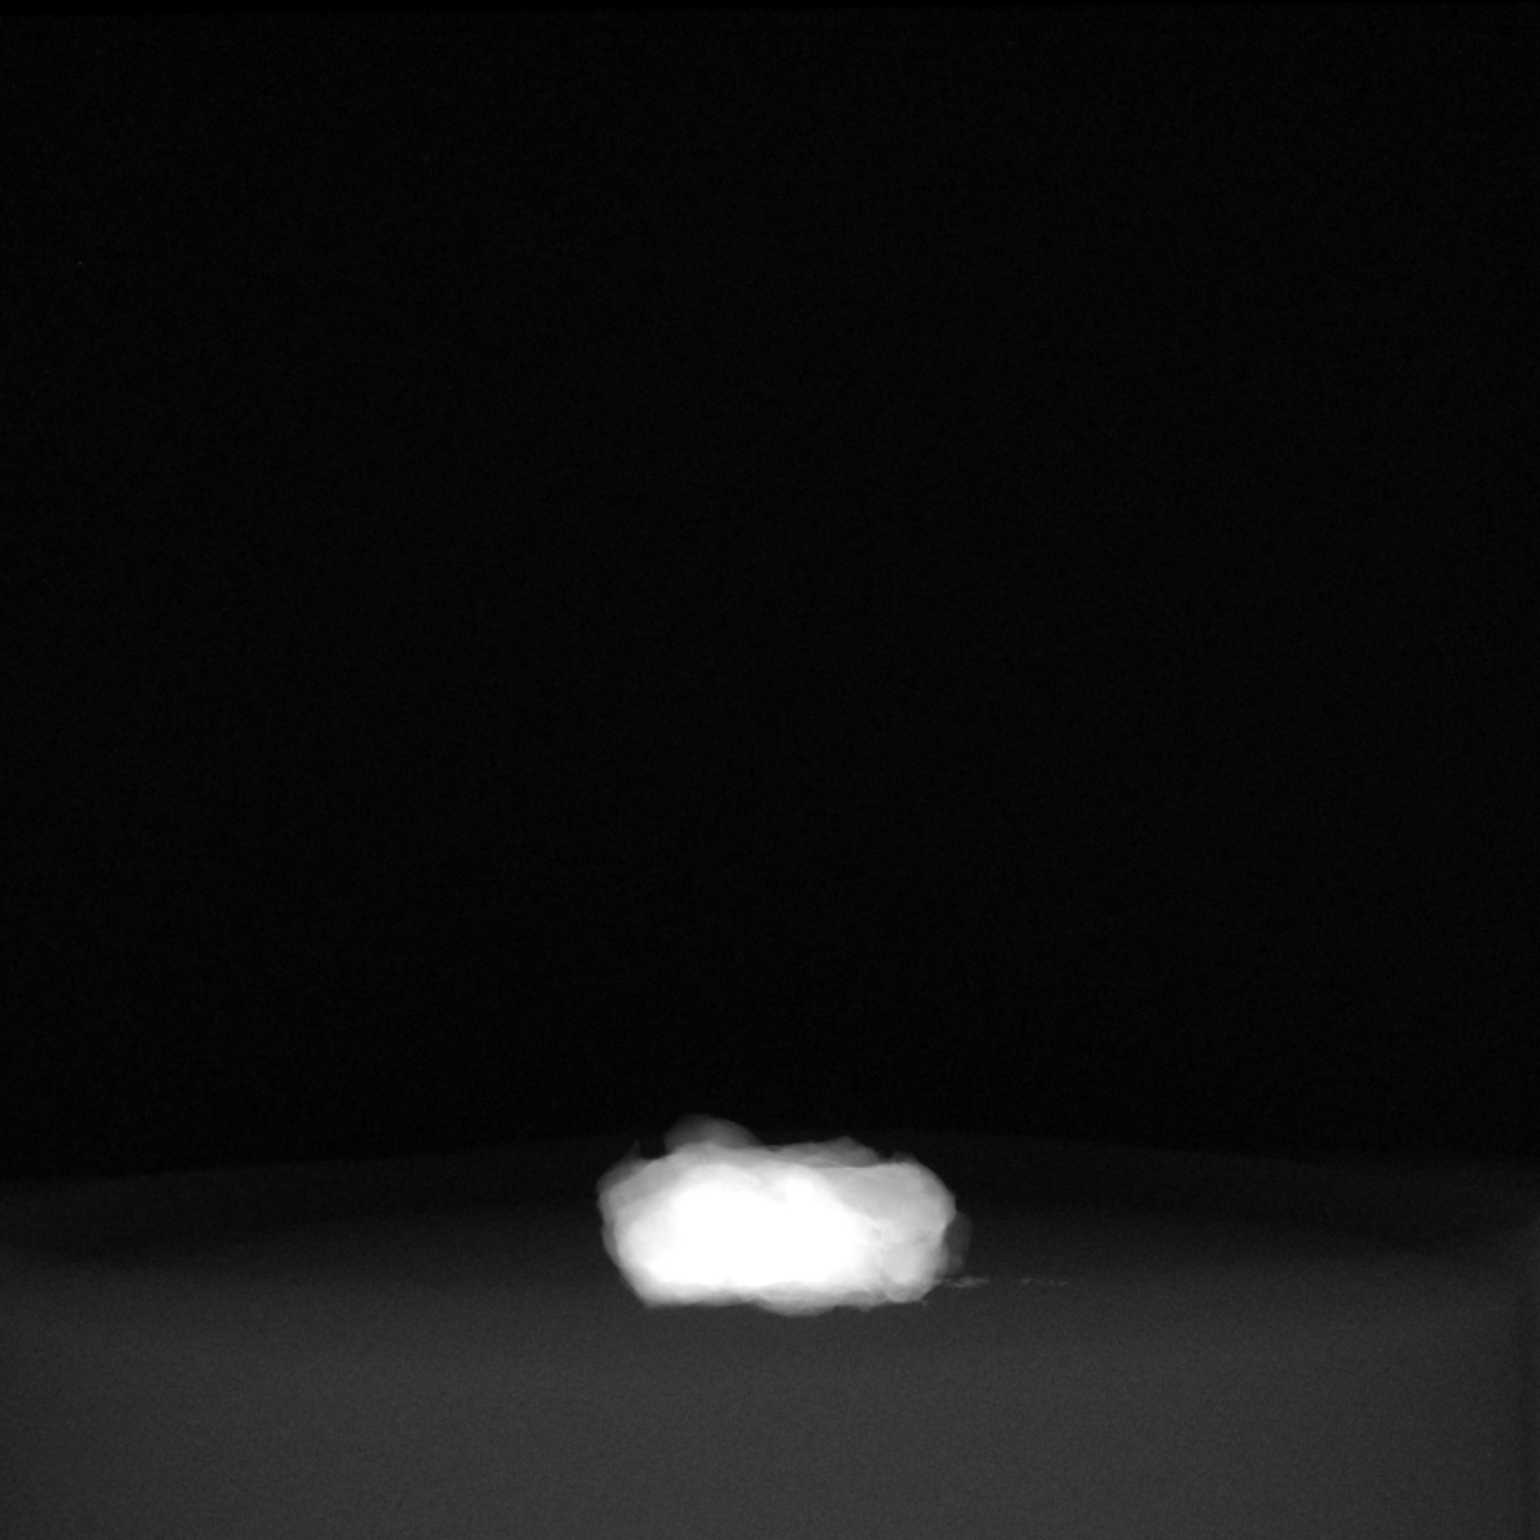

[2 of 2 positions shown; findings below may reference images not displayed]

FINDINGS: Status post excision of the right axilla. The radioactive seed and
biopsy marking clip is completely intact within the specimen. These
findings were communicated to the OR at [DATE] p.m.
IMPRESSION: Specimen radiograph of the right breast.

## 2020-04-15 SURGERY — BREAST LUMPECTOMY WITH RADIOACTIVE SEED LOCALIZATION
Anesthesia: General | Site: Breast | Laterality: Right

## 2020-04-15 MED ORDER — DEXAMETHASONE SODIUM PHOSPHATE 10 MG/ML IJ SOLN
INTRAMUSCULAR | Status: DC | PRN
  Administered 2020-04-15: 5 mg via INTRAVENOUS

## 2020-04-15 MED ORDER — LIDOCAINE 2% (20 MG/ML) 5 ML SYRINGE
INTRAMUSCULAR | Status: DC | PRN
  Administered 2020-04-15: 50 mg via INTRAVENOUS

## 2020-04-15 MED ORDER — CHLORHEXIDINE GLUCONATE CLOTH 2 % EX PADS: 6.0000 | MEDICATED_PAD | Freq: Once | CUTANEOUS | Status: DC

## 2020-04-15 MED ORDER — BUPIVACAINE-EPINEPHRINE 0.5% -1:200000 IJ SOLN
INTRAMUSCULAR | Status: DC | PRN
  Administered 2020-04-15: 10 mL

## 2020-04-15 MED ORDER — METHYLENE BLUE 0.5 % INJ SOLN
INTRAVENOUS | Status: AC
  Filled 2020-04-15: qty 10

## 2020-04-15 MED ORDER — FENTANYL CITRATE (PF) 100 MCG/2ML IJ SOLN
INTRAMUSCULAR | Status: AC
  Filled 2020-04-15: qty 2

## 2020-04-15 MED ORDER — DIPHENHYDRAMINE HCL 50 MG/ML IJ SOLN
INTRAMUSCULAR | Status: DC | PRN
  Administered 2020-04-15: 12.5 mg via INTRAVENOUS

## 2020-04-15 MED ORDER — FENTANYL CITRATE (PF) 100 MCG/2ML IJ SOLN
INTRAMUSCULAR | Status: DC | PRN
  Administered 2020-04-15: 50 ug via INTRAVENOUS
  Administered 2020-04-15: 25 ug via INTRAVENOUS

## 2020-04-15 MED ORDER — EPHEDRINE SULFATE-NACL 50-0.9 MG/10ML-% IV SOSY
PREFILLED_SYRINGE | INTRAVENOUS | Status: DC | PRN
  Administered 2020-04-15: 10 mg via INTRAVENOUS
  Administered 2020-04-15 (×4): 5 mg via INTRAVENOUS
  Administered 2020-04-15: 15 mg via INTRAVENOUS

## 2020-04-15 MED ORDER — DEXAMETHASONE SODIUM PHOSPHATE 10 MG/ML IJ SOLN
INTRAMUSCULAR | Status: AC
  Filled 2020-04-15: qty 1

## 2020-04-15 MED ORDER — MIDAZOLAM HCL 2 MG/2ML IJ SOLN
INTRAMUSCULAR | Status: AC
  Filled 2020-04-15: qty 2

## 2020-04-15 MED ORDER — ROPIVACAINE HCL 7.5 MG/ML IJ SOLN
INTRAMUSCULAR | Status: DC | PRN
  Administered 2020-04-15: 20 mL via PERINEURAL

## 2020-04-15 MED ORDER — ONDANSETRON HCL 4 MG/2ML IJ SOLN
INTRAMUSCULAR | Status: AC
  Filled 2020-04-15: qty 2

## 2020-04-15 MED ORDER — ONDANSETRON HCL 4 MG/2ML IJ SOLN: 4.0000 mg | Freq: Four times a day (QID) | INTRAMUSCULAR | Status: DC | PRN

## 2020-04-15 MED ORDER — FENTANYL CITRATE (PF) 100 MCG/2ML IJ SOLN
25.0000 ug | INTRAMUSCULAR | Status: DC | PRN
  Administered 2020-04-15 (×2): 50 ug via INTRAVENOUS

## 2020-04-15 MED ORDER — SODIUM CHLORIDE (PF) 0.9 % IJ SOLN
INTRAMUSCULAR | Status: AC
  Filled 2020-04-15: qty 10

## 2020-04-15 MED ORDER — ONDANSETRON HCL 4 MG/2ML IJ SOLN
INTRAMUSCULAR | Status: DC | PRN
  Administered 2020-04-15: 4 mg via INTRAVENOUS

## 2020-04-15 MED ORDER — LIDOCAINE 2% (20 MG/ML) 5 ML SYRINGE
INTRAMUSCULAR | Status: AC
  Filled 2020-04-15: qty 10

## 2020-04-15 MED ORDER — TECHNETIUM TC 99M TILMANOCEPT KIT
1.0000 | PACK | Freq: Once | INTRAVENOUS | Status: AC | PRN
  Administered 2020-04-15: 1 via INTRADERMAL

## 2020-04-15 MED ORDER — DIPHENHYDRAMINE HCL 50 MG/ML IJ SOLN
INTRAMUSCULAR | Status: AC
  Filled 2020-04-15: qty 1

## 2020-04-15 MED ORDER — CEFAZOLIN SODIUM-DEXTROSE 2-4 GM/100ML-% IV SOLN
2.0000 g | INTRAVENOUS | Status: AC
  Administered 2020-04-15: 2 g via INTRAVENOUS

## 2020-04-15 MED ORDER — ACETAMINOPHEN 500 MG PO TABS
ORAL_TABLET | ORAL | Status: AC
  Filled 2020-04-15: qty 2

## 2020-04-15 MED ORDER — FENTANYL CITRATE (PF) 100 MCG/2ML IJ SOLN
100.0000 ug | Freq: Once | INTRAMUSCULAR | Status: AC
  Administered 2020-04-15: 50 ug via INTRAVENOUS

## 2020-04-15 MED ORDER — OXYCODONE HCL 5 MG/5ML PO SOLN: 5.0000 mg | Freq: Once | ORAL | Status: DC | PRN

## 2020-04-15 MED ORDER — OXYCODONE HCL 5 MG PO TABS: 5.0000 mg | ORAL_TABLET | Freq: Four times a day (QID) | ORAL | 0 refills | Status: DC | PRN

## 2020-04-15 MED ORDER — LACTATED RINGERS IV SOLN: INTRAVENOUS | Status: DC

## 2020-04-15 MED ORDER — PROPOFOL 10 MG/ML IV BOLUS
INTRAVENOUS | Status: DC | PRN
  Administered 2020-04-15: 50 mg via INTRAVENOUS
  Administered 2020-04-15: 150 mg via INTRAVENOUS

## 2020-04-15 MED ORDER — OXYCODONE HCL 5 MG PO TABS: 5.0000 mg | ORAL_TABLET | Freq: Once | ORAL | Status: DC | PRN

## 2020-04-15 MED ORDER — MIDAZOLAM HCL 2 MG/2ML IJ SOLN
2.0000 mg | Freq: Once | INTRAMUSCULAR | Status: AC
  Administered 2020-04-15: 1 mg via INTRAVENOUS

## 2020-04-15 MED ORDER — CEFAZOLIN SODIUM-DEXTROSE 2-4 GM/100ML-% IV SOLN
INTRAVENOUS | Status: AC
  Filled 2020-04-15: qty 100

## 2020-04-15 MED ORDER — ACETAMINOPHEN 500 MG PO TABS
1000.0000 mg | ORAL_TABLET | ORAL | Status: AC
  Administered 2020-04-15: 1000 mg via ORAL

## 2020-04-15 SURGICAL SUPPLY — 50 items
APL PRP STRL LF DISP 70% ISPRP (MISCELLANEOUS) ×2
APL SKNCLS STERI-STRIP NONHPOA (GAUZE/BANDAGES/DRESSINGS) ×2
APPLIER CLIP 9.375 MED OPEN (MISCELLANEOUS) ×3
APR CLP MED 9.3 20 MLT OPN (MISCELLANEOUS) ×2
BENZOIN TINCTURE PRP APPL 2/3 (GAUZE/BANDAGES/DRESSINGS) ×3 IMPLANT
BLADE HEX COATED 2.75 (ELECTRODE) ×3 IMPLANT
BLADE SURG 15 STRL LF DISP TIS (BLADE) ×2 IMPLANT
BLADE SURG 15 STRL SS (BLADE) ×3
CANISTER SUC SOCK COL 7IN (MISCELLANEOUS) IMPLANT
CANISTER SUCT 1200ML W/VALVE (MISCELLANEOUS) IMPLANT
CHLORAPREP W/TINT 26 (MISCELLANEOUS) ×3 IMPLANT
CLIP APPLIE 9.375 MED OPEN (MISCELLANEOUS) ×2 IMPLANT
COVER BACK TABLE 60X90IN (DRAPES) ×3 IMPLANT
COVER MAYO STAND STRL (DRAPES) ×3 IMPLANT
COVER PROBE W GEL 5X96 (DRAPES) ×3 IMPLANT
COVER WAND RF STERILE (DRAPES) IMPLANT
DECANTER SPIKE VIAL GLASS SM (MISCELLANEOUS) IMPLANT
DRAPE LAPAROTOMY 100X72 PEDS (DRAPES) ×3 IMPLANT
DRAPE UTILITY XL STRL (DRAPES) ×3 IMPLANT
DRSG TEGADERM 4X4.75 (GAUZE/BANDAGES/DRESSINGS) ×4 IMPLANT
ELECT REM PT RETURN 9FT ADLT (ELECTROSURGICAL) ×3
ELECTRODE REM PT RTRN 9FT ADLT (ELECTROSURGICAL) ×2 IMPLANT
GAUZE SPONGE 4X4 12PLY STRL LF (GAUZE/BANDAGES/DRESSINGS) IMPLANT
GLOVE SURG ENC MOIS LTX SZ7 (GLOVE) ×3 IMPLANT
GLOVE SURG UNDER POLY LF SZ7.5 (GLOVE) ×3 IMPLANT
GOWN STRL REUS W/ TWL LRG LVL3 (GOWN DISPOSABLE) ×4 IMPLANT
GOWN STRL REUS W/TWL LRG LVL3 (GOWN DISPOSABLE) ×6
ILLUMINATOR WAVEGUIDE N/F (MISCELLANEOUS) IMPLANT
KIT MARKER MARGIN INK (KITS) ×3 IMPLANT
LIGHT WAVEGUIDE WIDE FLAT (MISCELLANEOUS) IMPLANT
NDL HYPO 25X1 1.5 SAFETY (NEEDLE) ×2 IMPLANT
NEEDLE HYPO 25X1 1.5 SAFETY (NEEDLE) ×3 IMPLANT
NS IRRIG 1000ML POUR BTL (IV SOLUTION) ×3 IMPLANT
PACK BASIN DAY SURGERY FS (CUSTOM PROCEDURE TRAY) ×3 IMPLANT
PENCIL SMOKE EVACUATOR (MISCELLANEOUS) ×3 IMPLANT
SLEEVE SCD COMPRESS KNEE MED (STOCKING) ×3 IMPLANT
SPONGE GAUZE 2X2 8PLY STRL LF (GAUZE/BANDAGES/DRESSINGS) IMPLANT
SPONGE LAP 18X18 RF (DISPOSABLE) IMPLANT
SPONGE LAP 4X18 RFD (DISPOSABLE) ×3 IMPLANT
STRIP CLOSURE SKIN 1/2X4 (GAUZE/BANDAGES/DRESSINGS) ×3 IMPLANT
SUT MON AB 4-0 PC3 18 (SUTURE) ×3 IMPLANT
SUT SILK 2 0 SH (SUTURE) IMPLANT
SUT VIC AB 3-0 SH 27 (SUTURE) ×3
SUT VIC AB 3-0 SH 27X BRD (SUTURE) ×2 IMPLANT
SYR BULB EAR ULCER 3OZ GRN STR (SYRINGE) IMPLANT
SYR CONTROL 10ML LL (SYRINGE) ×3 IMPLANT
TOWEL GREEN STERILE FF (TOWEL DISPOSABLE) ×3 IMPLANT
TRAY FAXITRON CT DISP (TRAY / TRAY PROCEDURE) ×3 IMPLANT
TUBE CONNECTING 20X1/4 (TUBING) IMPLANT
YANKAUER SUCT BULB TIP NO VENT (SUCTIONS) IMPLANT

## 2020-04-15 NOTE — Anesthesia Postprocedure Evaluation (Signed)
Anesthesia Post Note  Patient: Caitlyn Miller  Procedure(s) Performed: RIGHT BREAST LUMPECTOMY WITH RADIOACTIVE SEED LOCALIZATION (Right Breast) RADIOACTIVE SEED GUIDED RIGHT AXILLARY SENTINEL LYMPH NODE DISSECTION (Right Breast) SENTINEL NODE BIOPSY (N/A Breast)     Patient location during evaluation: PACU Anesthesia Type: General Level of consciousness: awake and alert Pain management: pain level controlled Vital Signs Assessment: post-procedure vital signs reviewed and stable Respiratory status: spontaneous breathing, nonlabored ventilation, respiratory function stable and patient connected to nasal cannula oxygen Cardiovascular status: blood pressure returned to baseline and stable Postop Assessment: no apparent nausea or vomiting Anesthetic complications: no   No complications documented.  Last Vitals:  Vitals:   04/15/20 1623 04/15/20 1624  BP:    Pulse: 86 80  Resp: 18 17  Temp:    SpO2: 100% 100%    Last Pain:  Vitals:   04/15/20 1621  TempSrc:   PainSc: 5                  Gustav Knueppel [name]

## 2020-04-15 NOTE — Progress Notes (Signed)
  Assisted Dr. Hodierne with right, ultrasound guided, pectoralis block. Side rails up, monitors on throughout procedure. See vital signs in flow sheet. Tolerated Procedure well. 

## 2020-04-15 NOTE — Anesthesia Procedure Notes (Signed)
Procedure Name: LMA Insertion Date/Time: 04/15/2020 1:53 PM Performed by: Gwyndolyn Saxon, CRNA Pre-anesthesia Checklist: Patient identified, Emergency Drugs available, Suction available and Patient being monitored Patient Re-evaluated:Patient Re-evaluated prior to induction Oxygen Delivery Method: Circle system utilized Preoxygenation: Pre-oxygenation with 100% oxygen Induction Type: IV induction Ventilation: Mask ventilation without difficulty LMA: LMA inserted LMA Size: 4.0 Number of attempts: 1 Placement Confirmation: positive ETCO2 and breath sounds checked- equal and bilateral Tube secured with: Tape Dental Injury: Teeth and Oropharynx as per pre-operative assessment

## 2020-04-15 NOTE — Discharge Instructions (Signed)
Next dose of Tylenol can be given after 5:50PM.     Post Anesthesia Home Care Instructions  Activity: Get plenty of rest for the remainder of the day. A responsible individual must stay with you for 24 hours following the procedure.  For the next 24 hours, DO NOT: -Drive a car -Paediatric nurse -Drink alcoholic beverages -Take any medication unless instructed by your physician -Make any legal decisions or sign important papers.  Meals: Start with liquid foods such as gelatin or soup. Progress to regular foods as tolerated. Avoid greasy, spicy, heavy foods. If nausea and/or vomiting occur, drink only clear liquids until the nausea and/or vomiting subsides. Call your physician if vomiting continues.  Special Instructions/Symptoms: Your throat may feel dry or sore from the anesthesia or the breathing tube placed in your throat during surgery. If this causes discomfort, gargle with warm salt water. The discomfort should disappear within 24 hours.  If you had a scopolamine patch placed behind your ear for the management of post- operative nausea and/or vomiting:  1. The medication in the patch is effective for 72 hours, after which it should be removed.  Wrap patch in a tissue and discard in the trash. Wash hands thoroughly with soap and water. 2. You may remove the patch earlier than 72 hours if you experience unpleasant side effects which may include dry mouth, dizziness or visual disturbances. 3. Avoid touching the patch. Wash your hands with soap and water after contact with the patch.       [place] Office Phone Number [phone]  BREAST BIOPSY/ PARTIAL MASTECTOMY: POST OP INSTRUCTIONS  Always review your discharge instruction sheet given to you by the facility where your surgery was performed.  IF YOU HAVE DISABILITY OR FAMILY LEAVE FORMS, YOU MUST BRING THEM TO THE OFFICE FOR PROCESSING.  DO NOT GIVE THEM TO YOUR DOCTOR.  1. A prescription for pain  medication may be given to you upon discharge.  Take your pain medication as prescribed, if needed.  If narcotic pain medicine is not needed, then you may take acetaminophen (Tylenol) or ibuprofen (Advil) as needed. 2. Take your usually prescribed medications unless otherwise directed 3. If you need a refill on your pain medication, please contact your pharmacy.  They will contact our office to request authorization.  Prescriptions will not be filled after 5pm or on week-ends. 4. You should eat very light the first 24 hours after surgery, such as soup, crackers, pudding, etc.  Resume your normal diet the day after surgery. 5. Most patients will experience some swelling and bruising in the breast.  Ice packs and a good support bra will help.  Swelling and bruising can take several days to resolve.  6. It is common to experience some constipation if taking pain medication after surgery.  Increasing fluid intake and taking a stool softener will usually help or prevent this problem from occurring.  A mild laxative (Milk of Magnesia or Miralax) should be taken according to package directions if there are no bowel movements after 48 hours. 7. Unless discharge instructions indicate otherwise, you may remove your bandages 24-48 hours after surgery, and you may shower at that time.  You may have steri-strips (small skin tapes) in place directly over the incision.  These strips should be left on the skin for 7-10 days.  If your surgeon used skin glue on the incision, you may shower in 24 hours.  The glue will flake off over the next 2-3 weeks.  Any  sutures or staples will be removed at the office during your follow-up visit. 8. ACTIVITIES:  You may resume regular daily activities (gradually increasing) beginning the next day.  Wearing a good support bra or sports bra minimizes pain and swelling.  You may have sexual intercourse when it is comfortable. a. You may drive when you no longer are taking prescription pain  medication, you can comfortably wear a seatbelt, and you can safely maneuver your car and apply brakes. b. RETURN TO WORK:  ______________________________________________________________________________________ 9. You should see your doctor in the office for a follow-up appointment approximately two weeks after your surgery.  Your doctor's nurse will typically make your follow-up appointment when she calls you with your pathology report.  Expect your pathology report 2-3 business days after your surgery.  You may call to check if you do not hear from [place] after three days. 10. OTHER INSTRUCTIONS: _______________________________________________________________________________________________ _____________________________________________________________________________________________________________________________________ _____________________________________________________________________________________________________________________________________ _____________________________________________________________________________________________________________________________________  WHEN TO CALL YOUR DOCTOR: 1. Fever over 101.0 2. Nausea and/or vomiting. 3. Extreme swelling or bruising. 4. Continued bleeding from incision. 5. Increased pain, redness, or drainage from the incision.  The clinic staff is available to answer your questions during regular business hours.  Please don't hesitate to call and ask to speak to one of the nurses for clinical concerns.  If you have a medical emergency, go to the nearest emergency room or call 911.  A surgeon from [facility] is always on call at the hospital.  For further questions, please visit [email or web address]

## 2020-04-15 NOTE — Op Note (Signed)
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 Anesthesia:  GEN - LMA/ PEC block Indications:  This is a 35 year old [name] female who presents about one year s/p bilateral retropectoral breast implants by Dr. Nathanial Rancher. The patient states that her last mammogram was several years ago. Over the last six months, she has noticed an enlarging palpable abnormality in the right breast - upper outer quadrant. She underwent workup which showed a 3.8 cm area of calcifications at 1100 4 cmfn. [place] measured this at 4.4 x 1.4 x. 1.4 cm with two thickened lymph nodes in the right axilla. 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 Her lymph node was positive, and the tumor was ER/PR positive, HER-2 was negative with a Ki-67 of 5%.  She also reports pain in the mid chest and mid back pain, (onset 3 years ago) which was previously intermittent and now constant in the past 3 months. She also notes pain and weakness in her b/l arms and legs. Her pain is 7-8/10. She had work up with MRI in [place] in summer 2021 which was reportedly negative. She notes the weakness leads to decreased function and activity. Her weight is mostly stable, but she is eating less. She describes frequent pain in her epigastrium that radiates through to her back, usually after eating or swallowing medication. She describes nausea, heartburn, bloating. She states that she had an ultrasound in [place] that showed no gallstones. She is on Protonix.  MRI and PET scan showed no left breast or distal metastases. Mammogram showed low risk disease saw chemotherapy was not given. She started  on neoadjuvant tamoxifen but tolerated this poorly. She was switched to anastrozole and is also receiving Zoladex injections monthly. She is also on reduced dose of Verzenio as she was not able to tolerate the full dose. Ultrasound performed on 03/01/20 showed that the right breast mass now measures 1.2 x 0.9 x 0.6 cm. The axilla shows only normal sized lymph nodes. MRI performed last week showed interval resolution of the right axillary adenopathy. The right upper outer quadrant shows persistent non-mass enhancement spanning 4.6 x 4.5 x 2.0 cm.  She presents now for bracketed lumpectomy/ targeted axillary lymph node dissection with sentinel lymph node biopsy.  Two radioactive seeds were placed yesterday to bracket the area of calcifications and a third seed was placed in the axillary lymph node that had previously been biopsied.  She was injected with technetium sulfur colloid in the pre-op area.  Description of procedure: The patient is brought to the operating room placed in supine position on the operating room table. After an adequate level of general anesthesia was obtained, we injected methylene blue dye solution into the dermis around her nipple.  Her right breast and axilla were prepped with ChloraPrep and draped in sterile fashion. A timeout was taken to ensure the proper patient and proper procedure. We interrogated the breast with the neoprobe. We made a transverse incision between the areas of activity in the right upper outer quadrant after infiltrating with Marcaine. Dissection was carried down in the breast tissue with cautery. We used the neoprobe to guide [place] towards the radioactive seeds. We excised an area of tissue around the  radioactive seed to include both radioactive seeds and all of the tissue in between down to the underlying pectoralis muscle. The specimen was removed and was oriented with a paint kit. Specimen mammogram showed the radioactive seeds as well as the biopsy clips within  the specimen. This was sent for pathologic examination.  Clips were placed in all five margins.  We inspected carefully for hemostasis. The wound was thoroughly irrigated.   We then turned our attention to the axilla.  We interrogated the axilla with the Neoprobe and I identified an area of radioactivity.  I made a transverse incision over the area of activity.  We dissected into the axilla and identified an enlarged lymph node that showed radioactivity.  We excised this lymph node entirely.  Specimen mammogram showed the radioactive seed and Hydromark within the node.  We adjusted the settings on the Neoprobe and interrogated the axilla.  We identified a radioactive node.This was dissected free and sent as "sentinel lymph node #1".  We interrogated the axilla again and a second node was identified.  This was sent as "sentinel lymph node #2."  There was minimal background activity.  The wounds were closed with a deep layer of 3-0 Vicryl and a subcuticular layer of 4-0 Monocryl. Benzoin Steri-Strips were applied. The patient was then extubated and brought to the recovery room in stable condition. All sponge, instrument, and needle counts are correct.  Imogene Burn. Georgette Dover, MD, [facility]  General/ Trauma Surgery  12/08/2019 1:38 PM

## 2020-04-15 NOTE — Transfer of Care (Signed)
Immediate Anesthesia Transfer of Care Note  Patient: Caitlyn Miller  Procedure(s) Performed: RIGHT BREAST LUMPECTOMY WITH RADIOACTIVE SEED LOCALIZATION (Right Breast) RADIOACTIVE SEED GUIDED RIGHT AXILLARY SENTINEL LYMPH NODE DISSECTION (Right Breast) SENTINEL NODE BIOPSY (N/A Breast)  Patient Location: PACU  Anesthesia Type:GA combined with regional for post-op pain  Level of Consciousness: drowsy  Airway & Oxygen Therapy: Patient Spontanous Breathing and Patient connected to face mask oxygen  Post-op Assessment: Report given to RN and Post -op Vital signs reviewed and stable  Post vital signs: Reviewed and stable  Last Vitals:  Vitals Value Taken Time  BP 129/60 04/15/20 1517  Temp    Pulse 84 04/15/20 1521  Resp 18 04/15/20 1521  SpO2 100 % 04/15/20 1521  Vitals shown include unvalidated device data.  Last Pain:  Vitals:   04/15/20 1147  TempSrc: Oral  PainSc: 0-No pain         Complications: No complications documented.

## 2020-04-15 NOTE — Anesthesia Procedure Notes (Signed)
Anesthesia Regional Block: Pectoralis block   Pre-Anesthetic Checklist: ,, timeout performed, Correct Patient, Correct Site, Correct Laterality, Correct Procedure, Correct Position, site marked, Risks and benefits discussed,  Surgical consent,  Pre-op evaluation,  At surgeon's request and post-op pain management  Laterality: Right  Prep: chloraprep       Needles:  Injection technique: Single-shot  Needle Type: Echogenic Needle     Needle Length: 9cm  Needle Gauge: 21     Additional Needles:   Narrative:  Start time: 04/15/2020 12:44 PM End time: 04/15/2020 12:55 PM Injection made incrementally with aspirations every 5 mL.  Performed by: Personally  Anesthesiologist: Albertha Ghee, MD  Additional Notes: Pt tolerated the procedure well.

## 2020-04-15 NOTE — Anesthesia Preprocedure Evaluation (Signed)
Anesthesia Evaluation  Patient identified by MRN, date of birth, ID band Patient awake    Reviewed: Allergy & Precautions, H&P , NPO status , Patient's Chart, lab work & pertinent test results  Airway Mallampati: II   Neck ROM: full    Dental   Pulmonary neg pulmonary ROS,    breath sounds clear to auscultation       Cardiovascular negative cardio ROS   Rhythm:regular Rate:Normal     Neuro/Psych PSYCHIATRIC DISORDERS Anxiety Depression    GI/Hepatic GERD  ,  Endo/Other    Renal/GU      Musculoskeletal   Abdominal   Peds  Hematology   Anesthesia Other Findings   Reproductive/Obstetrics                             Anesthesia Physical Anesthesia Plan  ASA: II  Anesthesia Plan: General   Post-op Pain Management:  Regional for Post-op pain   Induction: Intravenous  PONV Risk Score and Plan: 3 and Ondansetron, Dexamethasone, Midazolam and Treatment may vary due to age or medical condition  Airway Management Planned: LMA  Additional Equipment:   Intra-op Plan:   Post-operative Plan: Extubation in OR  Informed Consent: I have reviewed the patients History and Physical, chart, labs and discussed the procedure including the risks, benefits and alternatives for the proposed anesthesia with the patient or authorized representative who has indicated his/her understanding and acceptance.     Dental advisory given  Plan Discussed with: CRNA, Anesthesiologist and Surgeon  Anesthesia Plan Comments:         Anesthesia Quick Evaluation

## 2020-04-15 NOTE — Interval H&P Note (Signed)
History and Physical Interval Note:  04/15/2020 11:48 AM  Caitlyn Miller  has presented today for surgery, with the diagnosis of RIGHT BREAST INVASIVE DUCTAL CARCINOMA.  The various methods of treatment have been discussed with the patient and family. After consideration of risks, benefits and other options for treatment, the patient has consented to  Procedure(s): RIGHT BREAST LUMPECTOMY WITH RADIOACTIVE SEED LOCALIZATION (Right) RADIOACTIVE SEED GUIDED RIGHT AXILLARY SENTINEL LYMPH NODE DISSECTION (Right) SENTINEL NODE BIOPSY (N/A) as a surgical intervention.  The patient's history has been reviewed, patient examined, no change in status, stable for surgery.  I have reviewed the patient's chart and labs.  Questions were answered to the patient's satisfaction.     Maia Petties

## 2020-04-15 NOTE — Progress Notes (Signed)
Nuc med at bedside for injections.  VSS, and pt tolerated procedure well.

## 2020-04-16 ENCOUNTER — Encounter (HOSPITAL_BASED_OUTPATIENT_CLINIC_OR_DEPARTMENT_OTHER): Payer: Self-pay | Admitting: Surgery

## 2020-04-21 ENCOUNTER — Encounter: Payer: Self-pay | Admitting: *Deleted

## 2020-04-22 ENCOUNTER — Other Ambulatory Visit (HOSPITAL_COMMUNITY): Payer: Self-pay

## 2020-04-23 ENCOUNTER — Ambulatory Visit: Payer: Self-pay | Admitting: Surgery

## 2020-04-23 NOTE — H&P (View-Only) (Signed)
History of Present Illness Imogene Burn. Jacinda Kanady MD; 04/23/2020 1:31 PM) The patient is a 35 year old female who presents with breast cancer. Breast cancer [facility] - 11/19/19 [name] PCP - Dr. [name]  This is a 35 year old [name] female who presents about one year s/p bilateral retropectoral breast implants by Dr. Nathanial Rancher. The patient states that her last mammogram was several years ago. Over the last six months, she has noticed an enlarging palpable abnormality in the right breast - upper outer quadrant. She underwent workup which showed a 3.8 cm area of calcifications at 1100 4 cmfn. [place] measured this at 4.4 x 1.4 x. 1.4 cm with two thickened lymph nodes in the right axilla. 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 Her lymph node was positive, and the tumor was ER/PR positive, HER-2 was negative with a Ki-67 of 5%.  She also reports pain in the mid chest and mid back pain, (onset 3 years ago) which was previously intermittent and now constant in the past 3 months. She also notes pain and weakness in her b/l arms and legs. Her pain is 7-8/10. She had work up with MRI in [place] in summer 2021 which was reportedly negative. She notes the weakness leads to decreased function and activity. Her weight is mostly stable, but she is eating less. She describes frequent pain in her epigastrium that radiates through to her back, usually after eating or swallowing medication. She describes nausea, heartburn, bloating. She states that she had an ultrasound in [place] that showed no gallstones. She is on Protonix.  MRI and PET scan showed no left breast or distal metastases. Mammogram showed low risk disease saw chemotherapy was not given. She started on neoadjuvant tamoxifen but tolerated this poorly. She was switched to anastrozole and is also receiving Zoladex injections monthly. She is  also on reduced dose of Verzenio as she was not able to tolerate the full dose. Ultrasound performed on 03/01/20 showed that the right breast mass now measures 1.2 x 0.9 x 0.6 cm. The axilla shows only normal sized lymph nodes. MRI performed last week showed interval resolution of the right axillary adenopathy. The right upper outer quadrant shows persistent non-mass enhancement spanning 4.6 x 4.5 x 2.0 cm.  I had an extended discussion with the patient and her husband. Clinically, the palpable mass has essentially resolved. I cannot really palpate a mass in the right upper outer quadrant. The clinically positive lymph nodes are now low no longer palpable. There is some concern with the persistent large area of non-mass enhancement in the right upper outer quadrant. At this time, it is difficult to determine the cause of this non-mass enhancement. This may be residual changes post-neoadjuvant treatment.  I recommended proceeding with a right radioactive seed localized lumpectomy. We will also perform a targeted axillary lymph node dissection to remove the previously biopsied lymph nodes as well as a sentinel lymph node biopsy using blue dye and radioactive tracer. They understand that if the margins of the lumpectomy comeback widely positive that she might need a larger surgery with possible mastectomy. On 04/15/20, she underwent bracketed right radioactive seed localized lumpectomy as well as targeted lymph node dissection and sentinel lymph node biopsy. Pathology showed a 3.1 cm grade 2 invasive carcinoma with mixed ductal and lobular features with associated DCIS. Invasive cancer broadly involves anterior, posterior, superior, inferior margins. DCIS  involved inferior margin. 3 lymph nodes were examined. 2 showed metastatic carcinoma.  I discussed the pathology report the patient and her husband by telephone today, today for postop check and further discussion. She has also been referred to  plastic surgery. They do not want to see the original plastic surgeon so she has been referred to Dr. Iran Planas.     Problem List/Past Medical Rodman Key K. 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 Federica Allport, MD; 04/23/2020 1:31 PM) Breast Biopsy Bilateral.  Diagnostic Studies History Rodman Key K. Humzah Harty, MD; 04/23/2020 1:31 PM) Colonoscopy 5-10 years ago Mammogram within last year Pap Smear 1-5 years ago  Allergies [name], LPN; 03/17/9516 84:16 AM) Ibuprofen *ANALGESICS - ANTI-INFLAMMATORY* Nausea, Vomiting. Allergies Reconciled  Medication History [name], LPN; 6/0/6301 60:10 AM) oxyCODONE HCl (5MG Tablet, Oral) Active. Gabapentin (100MG Capsule, Oral) Active. Arimidex (1MG Tablet, Oral) Active. Zofran (8MG Tablet, Oral) Active. Lexapro (10MG Tablet, Oral) Active. hydrOXYzine HCl (25MG Tablet, Oral) Active. Protonix (40MG Tablet DR, Oral) Active. Medications Reconciled  Social History Imogene Burn. Collene Massimino, MD; 04/23/2020 1:31 PM) Caffeine use Coffee, Tea. No alcohol use No drug use Tobacco use Never smoker.  Family History Imogene Burn. Niva Murren, MD; 04/23/2020 1:31 PM) Migraine Headache Mother.  Pregnancy / Birth History Imogene Burn. Yashas Camilli, MD; 04/23/2020 1:31 PM) Age at menarche 89 years. Contraceptive History Intrauterine device. Gravida 2 Length (months) of breastfeeding 12-24 Maternal age 20-20 Para 2  Other Problems Imogene Burn. Kayl Stogdill, MD; 04/23/2020 1:31 PM) Back Pain Chest pain Depression Gastroesophageal Reflux Disease     Physical Exam Rodman Key K. Hadessah Grennan MD; 04/23/2020 1:32 PM)  The physical exam findings are as follows: Note:onstitutional: WDWN in NAD, conversant, no obvious deformities; resting comfortably Eyes: Pupils equal, round; sclera anicteric; moist conjunctiva; no lid lag HENT: Oral mucosa moist; good dentition Neck: No masses palpated,  trachea midline; no thyromegaly Lungs: CTA bilaterally; normal respiratory effort Breasts: bilateral implants via inframammary incisions; no palpable mass in either breast; no axillary lymphadenopathy on either side Healing right upper outer quadrant breast incision and right axillary incision with no sign of infection. CV: Regular rate and rhythm; no murmurs; extremities well-perfused with no edema Abd: +bowel sounds, soft, non-tender, no palpable organomegaly; no palpable hernias Musc: Normal gait; no apparent clubbing or cyanosis in extremities Lymphatic: No palpable cervical or axillary lymphadenopathy Skin: Warm, dry; no sign of jaundice Psychiatric - alert and oriented x 4; calm mood and affect    Assessment & Plan Rodman Key K. 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 The surgical procedure has been discussed with the patient. Potential risks, benefits, alternative treatments, and expected outcomes have been explained. All of the patient's questions at this time have been answered. The likelihood of reaching the patient's treatment goal is good. The patient understand the proposed surgical procedure and wishes to proceed. Note:I had a long discussion with the patient and her husband. At this point with some any positive margins and the positive axillary lymph nodes, we recommend modified radical mastectomy. The patient is very concerned about the possibility of edema after axillary dissection. She is scheduled to see physical therapy soon for follow-up. Questions remained about the management of her current breast implants as the patient will likely need radiation after surgery. She will see Dr. Iran Planas as soon as possible and we will develop  a surgical plan. From oncologic standpoint she needs a mastectomy with axillary lymph node dissection. She briefly  discussed prophylactic mastectomy on the left but she does not seem to want to pursue this option at this time. We will await the results of her consultation with plastics before scheduling surgery. She is moderately concerned about the possibility of losing her right nipple with her mastectomy but she will discuss this further with plastics.  Imogene Burn. Georgette Dover, MD, [facility]  General/ Trauma Surgery   04/23/2020 1:34 PM

## 2020-04-23 NOTE — Progress Notes (Signed)
[place]   [phone]) [phone] [phone]) [phone]   Clinic Follow up Note   Patient Care Team: [name], Lilia Argue, MD as PCP - General (Family Medicine) Mansouraty, Telford Nab., MD as Consulting Physician (Gastroenterology) Mauro Kaufmann, RN as Oncology Nurse Navigator Rockwell Germany, RN as Oncology Nurse Navigator Donnie Mesa, MD as Consulting Physician (General Surgery) Truitt Merle, MD as Consulting Physician (Hematology) Kyung Rudd, MD as Consulting Physician (Radiation Oncology) Contogiannis, Audrea Muscat, MD as Consulting Physician (Plastic Surgery)  Date of Service:  04/23/2020  CHIEF COMPLAINT: F/u of right breast cancer  SUMMARY OF ONCOLOGIC HISTORY: Oncology History Overview Note  Cancer Staging Malignant neoplasm of upper-outer quadrant of right breast in female, estrogen receptor positive ([place]) Staging form: Breast, AJCC 8th Edition - Clinical stage from 11/12/2019: Stage IIA (cT2, cN1, cM0, G2, ER+, PR+, HER2-) - Signed by Truitt Merle, MD on 11/19/2019    Malignant neoplasm of upper-outer quadrant of right breast in female, estrogen receptor positive ([place])  11/05/2019 Mammogram   IMPRESSION: Large irregular palpable mass 4.7 x 1.4 x 1.4 cm within the upper-outer right breast 11 o'clock position, 4 cm from nipple.  There is a large area of associated coarse heterogeneous calcifications within the mass. Overall findings are concerning for breast carcinoma.   Two cortically thickened right axillary lymph nodes which are indeterminate in etiology.   11/12/2019 Cancer Staging   Staging form: Breast, AJCC 8th Edition - Clinical stage from 11/12/2019: Stage IIA (cT2, cN1, cM0, G2, ER+, PR+, HER2-) - Signed by Truitt Merle, MD on 11/19/2019   11/12/2019 Initial Biopsy   Diagnosis 1. Breast, right, needle core biopsy, right - INVASIVE MAMMARY CARCINOMA, SEE COMMENT. - MAMMARY CARCINOMA IN SITU. 2. Lymph node, needle/core biopsy, right -  METASTATIC MAMMARY CARCINOMA. Microscopic Comment 1. The carcinoma appears grade 2 and measures 16 mm in greatest linear extent. E-cadherin will be ordered. Prognostic makers will be ordered. Dr. Saralyn Pilar has reviewed the case. The case was called to [facility] [address] on 01/13/2020.    1. E-cadherin is strongly positive consistent with a ductal phenotype.   11/12/2019 Receptors her2   1. PROGNOSTIC INDICATORS Results: IMMUNOHISTOCHEMICAL AND MORPHOMETRIC ANALYSIS PERFORMED MANUALLY The tumor cells are NEGATIVE for Her2 (0). Estrogen Receptor: 100%, POSITIVE, STRONG STAINING INTENSITY Progesterone Receptor: 100%, POSITIVE, STRONG STAINING INTENSITY Proliferation Marker Ki67: 5%   11/17/2019 Initial Diagnosis   Malignant neoplasm of upper-outer quadrant of right breast in female, estrogen receptor positive ([place])   11/25/2019 Breast MRI   IMPRESSION: 1. Large area of non-mass enhancement involving the UPPER OUTER QUADRANT of the RIGHT breast measuring approximately 4.2 x 4.1 x 2.4 cm. The biopsy-proven IDC and DCIS is present at the superomedial aspect of this NME. 2. No MRI evidence of malignancy involving the LEFT breast. 3. Intact BILATERAL retropectoral implants. 4. 2 pathologically enlarged RIGHT axillary lymph nodes. One of these nodes is biopsy-proven metastatic disease. No pathologic lymphadenopathy elsewhere   11/25/2019 PET scan   IMPRESSION: 1. Two mildly enlarged hypermetabolic right axillary lymph nodes compatible with right axillary nodal metastases. 2. No additional sites of hypermetabolic metastatic disease. 3. Asymmetric indistinct upper right breast hypermetabolism, without discrete mass correlate on the CT images, compatible with known primary right breast malignancy.     11/26/2019 Genetic Testing   Negative genetic testing: no pathogenic variants detected in [facility] STAT Breast Cancer Panel.  Variant of uncertain significance (VUS) detected in  CHEK2 at c.1556G>T (p.Arg519Leu).  The report date  is November 26, 2019.   The STAT Breast cancer panel offered by [facility] includes sequencing and rearrangement analysis for the following 9 genes:  ATM, BRCA1, BRCA2, CDH1, CHEK2, PALB2, PTEN, STK11 and TP53.    Results of [facility] Multi-Cancer Panel are pending.    12/01/2019 -  Neo-Adjuvant Anti-estrogen oral therapy   Neoadjuvant Tamoxifen 58m on 12/01/19. Reduced to 166mon 12/09/19.       ---Zoladex injection monthly starting 12/09/19.       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  Variants of uncertain significance detected RAD51D at c.715C>T (p.Arg239Trp) and CHEK2 at c.1556G>T (p.Arg519Leu).  The report date is December 08, 2019.   The Multi-Cancer Panel offered by [facility] includes sequencing and/or deletion duplication testing of the following 85 genes: AIP, ALK, APC, ATM, AXIN2,BAP1,  BARD1, BLM, BMPR1A, BRCA1, BRCA2, BRIP1, CASR, CDC73, CDH1, CDK4, CDKN1B, CDKN1C, CDKN2A (p14ARF), CDKN2A (p16INK4a), CEBPA, CHEK2, CTNNA1, DICER1, DIS3L2, EGFR (c.2369C>T, p.Thr790Met variant only), EPCAM (Deletion/duplication testing only), FH, FLCN, GATA2, GPC3, GREM1 (Promoter region deletion/duplication testing only), HOXB13 (c.251G>A, p.Gly84Glu), HRAS, KIT, MAX, MEN1, MET, MITF (c.952G>A, p.Glu318Lys variant only), MLH1, MSH2, MSH3, MSH6, MUTYH, NBN, NF1, NF2, NTHL1, PALB2, PDGFRA, PHOX2B, PMS2, POLD1, POLE, POT1, PRKAR1A, PTCH1, PTEN, RAD50, RAD51C, RAD51D, RB1, RECQL4, RET, RNF43, RUNX1, SDHAF2, SDHA (sequence changes only), SDHB, SDHC, SDHD, SMAD4, SMARCA4, SMARCB1, SMARCE1, STK11, SUFU, TERC, TERT, TMEM127, TP53, TSC1, TSC2, VHL, WRN and WT1.    03/01/2020 Mammogram   Targeted ultrasound is performed, showing interval decreasing conspicuity of the patient's known right breast cancer. A  post biopsy clip with an associated irregular area shadowing is demonstrated at the 11 o'clock position 4 cm from the nipple. Exact measurements are difficult due to the vague appearance of the findings. It measures approximately 1.2 x 0.9 x 0.6 cm (previously 4.7 x 2.2 x 1.5 cm).   IMPRESSION: Imaging findings consistent with good response to chemotherapy.   03/16/2020 Imaging   MRI Breast  IMPRESSION: 1. Interval resolution of RIGHT axillary adenopathy. 2. Slightly smaller area of non mass enhancement in the UPPER-OUTER QUADRANT of the RIGHT breast.      CURRENT THERAPY:  Neoadjuvant Tamoxifen 2014mily startedon 12/01/19. Reduced to 80m18m 11/23/21due to poor tolerance.Zoladex injection monthly starting 12/09/19.Switched Tamoxifen to Anastrozole on 01/06/20.   INTERVAL HISTORY:  Caitlyn Robleyhere for a follow up. She was last seen by me on 03/25/20. She presents to the clinic alone. She notes she is not recovering well from her breast surgery. She notes she is in a lot of pain with soreness.    REVIEW OF SYSTEMS:   Constitutional: Denies fevers, chills or abnormal weight loss Eyes: Denies blurriness of vision Ears, nose, mouth, throat, and face: Denies mucositis or sore throat Respiratory: Denies cough, dyspnea or wheezes Cardiovascular: Denies palpitation, chest discomfort or lower extremity swelling Gastrointestinal:  Denies nausea, heartburn or change in bowel habits Skin: Denies abnormal skin rashes Lymphatics: Denies new lymphadenopathy or easy bruising Neurological:Denies numbness, tingling or new weaknesses Behavioral/Psych: Mood is stable, no new changes  All other systems were reviewed with the patient and are negative.  MEDICAL HISTORY:  Past Medical History:  Diagnosis Date  . Abnormal Pap smear   . Acid reflux   . Anemia   . Anxiety   . Breast cancer ([facility])[place]. Decreased appetite 10/08  . Depression   . Endometriosis  10/2004  . Epigastric pain  10/2006  . Family history of breast cancer 11/19/2019  . FH: migraines   . GBS carrier   . H/O amenorrhea 06/2006  . H/O dyspareunia 7/07  . H/O fatigue   . H/O nausea and vomiting 10/2006  . H/O rubella   . H/O varicella   . Hyperemesis arising during pregnancy    First pregnancy  . Irregular periods/menstrual cycles 02/2005  . 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 Pelvic pain 09/2008    SURGICAL HISTORY: Past Surgical History:  Procedure Laterality Date  . AUGMENTATION MAMMAPLASTY Bilateral 2563   silicone   . BREAST LUMPECTOMY WITH RADIOACTIVE SEED LOCALIZATION Right 04/15/2020   Procedure: RIGHT BREAST LUMPECTOMY WITH RADIOACTIVE SEED LOCALIZATION;  Surgeon: Donnie Mesa, MD;  Location: [place];  Service: General;  Laterality: Right;  . RADIOACTIVE SEED GUIDED AXILLARY SENTINEL LYMPH NODE Right 04/15/2020   Procedure: RADIOACTIVE SEED GUIDED RIGHT AXILLARY SENTINEL LYMPH NODE DISSECTION;  Surgeon: Donnie Mesa, MD;  Location: [facility];  Service: General;  Laterality: Right;  . SENTINEL NODE BIOPSY N/A 04/15/2020   Procedure: SENTINEL NODE BIOPSY;  Surgeon: Donnie Mesa, MD;  Location: [place];  Service: General;  Laterality: N/A;  . WISDOM TOOTH EXTRACTION      I have reviewed the social history and family history with the patient and they are unchanged from previous note.  ALLERGIES:  is allergic to ibuprofen.  MEDICATIONS:  Current Outpatient Medications  Medication Sig Dispense Refill  . abemaciclib (VERZENIO) 50 MG tablet Take 1 tablet (50 mg) in the morning and 2 tablets (100 mg) in the evening. Swallow tablets whole. Do not chew, crush, or split tablets before swallowing. 90 tablet 1  . anastrozole (ARIMIDEX) 1 MG tablet TAKE 1 TABLET BY MOUTH DAILY. 30 tablet 3  . clindamycin (CLINDAGEL) 1 % gel Apply topically 2 (two) times daily. 30 g 0  . gabapentin (NEURONTIN) 100 MG capsule Take 2 capsules (200 mg  total) by mouth at bedtime. 60 capsule 1  . ondansetron (ZOFRAN ODT) 8 MG disintegrating tablet Take 1 tablet (8 mg total) by mouth every 8 (eight) hours as needed for nausea or vomiting. 30 tablet 2  . oxyCODONE (OXY IR/ROXICODONE) 5 MG immediate release tablet Take 1 tablet (5 mg total) by mouth every 6 (six) hours as needed for severe pain. 15 tablet 0  . pantoprazole (PROTONIX) 40 MG tablet Take 1 tablet (40 mg total) by mouth daily as needed. 30 tablet 5  . prochlorperazine (COMPAZINE) 5 MG tablet Take 1-2 tablets (5-10 mg total) by mouth every 6 (six) hours as needed for nausea or vomiting. 30 tablet 1  . sucralfate (CARAFATE) 1 g tablet Take 1 tablet (1 g total) by mouth 4 (four) times daily -  with meals and at bedtime. 60 tablet 1  . venlafaxine (EFFEXOR) 37.5 MG tablet TAKE 1 TABLET(37.5 MG) BY MOUTH TWICE DAILY 30 tablet 4   No current facility-administered medications for this visit.   Facility-Administered Medications Ordered in Other Visits  Medication Dose Route Frequency Provider Last Rate Last Admin  . ondansetron (ZOFRAN) tablet 8 mg  8 mg Oral Once Truitt Merle, MD        PHYSICAL EXAMINATION: ECOG PERFORMANCE STATUS: 1 - Symptomatic but completely ambulatory  There were no vitals filed for this visit. There were no vitals filed for this visit.  GENERAL:alert, no distress and comfortable SKIN: skin color, texture, turgor are normal,  no rashes or significant lesions EYES: normal, Conjunctiva are pink and non-injected, sclera clear  NECK: supple, thyroid normal size, non-tender, without nodularity LYMPH:  no palpable lymphadenopathy in the cervical, axillary  LUNGS: clear to auscultation and percussion with normal breathing effort HEART: regular rate & rhythm and no murmurs and no lower extremity edema ABDOMEN:abdomen soft, non-tender and normal bowel sounds Musculoskeletal:no cyanosis of digits and no clubbing  NEURO: alert & oriented x 3 with fluent speech, no focal  motor/sensory deficits BREAST: S/p right lumpectomy and LN Dissection: Surgical incision healed well with mild right axillary seroma at axillary incision.    LABORATORY DATA:  I have reviewed the data as listed CBC Latest Ref Rng & Units 03/25/2020 03/03/2020 01/27/2020  WBC 4.0 - 10.5 K/uL 4.7 5.8 5.7  Hemoglobin 12.0 - 15.0 g/dL 12.0 12.6 12.2  Hematocrit 36.0 - 46.0 % 35.7(L) 37.7 36.9  Platelets 150 - 400 K/uL 282 261 209     CMP Latest Ref Rng & Units 03/25/2020 03/03/2020 01/27/2020  Glucose 70 - 99 mg/dL 87 97 94  BUN 6 - 20 mg/dL 11 8 11   Creatinine 0.44 - 1.00 mg/dL 0.90 0.89 0.82  Sodium 135 - 145 mmol/L 141 141 140  Potassium 3.5 - 5.1 mmol/L 3.9 3.9 4.5  Chloride 98 - 111 mmol/L 107 106 107  CO2 22 - 32 mmol/L 26 26 27   Calcium 8.9 - 10.3 mg/dL 9.6 9.3 9.2  Total Protein 6.5 - 8.1 g/dL 7.9 7.7 7.1  Total Bilirubin 0.3 - 1.2 mg/dL 0.5 0.6 0.4  Alkaline Phos 38 - 126 U/L 41 40 34(L)  AST 15 - 41 U/L 18 17 17   ALT 0 - 44 U/L 15 14 17       RADIOGRAPHIC STUDIES: I have personally reviewed the radiological images as listed and agreed with the findings in the report. No results found.   ASSESSMENT & PLAN:  Ethell Blatchford is a 35 y.o. female with    1.Malignant neoplasm of upper-outer quadrant of right breast, StageIIA, p(T2N1aM0), ER+/PR+/HER2-, Levester Fresh, Mammaprintluminal type A, low risk -She has a 4.7cm right breast mass at 11:00 position andtwo abnormalright axillary LN. Her10/27/21biopsy shows grade II invasive ductal carcinomawithmetastasisto her LN, ER/PRstrongly positive, HER2 negative, low Ki67.  -Her 11/2019 breast MRI and PET scan did not indicate left breast or distant malignancy or metastasis. -Due to her large tumor, she needsmastectomy if she proceeded with surgery first. She has been seen by Dr. Georgette Dover.Her mastectomy will be complicated by history of implant placement. -Mammaprint showed low risk disease,shewouldunlikely benefit from  chemotherapy, and neoadjuvant chemotherapy is not recommended. -I have started her on neoadjuvant tamoxifen on 12/01/19. She tolerated poorly with nausea, and diffuse body aches. Dose reduced to 40m from 12/09/19.I started her on monthly Zoladex injection on 12/09/19. Then switched Tamoxifen to Anastrozole on 01/06/20. -I started her on Verzenio on 01/12/2020, dose reduced to 55mam and 10016m/11/2020 due to poor tolerance. Held Verzenio on 03/25/20 to proceed with breast surgery.  -Given her 03/01/20 [place] mammogram indicated good response to treatment, she proceeded with right lumpectomy and sentinel LN dissection on 04/15/20 with Dr TsuGeorgette Doverurgical pathology showed larger than expected tumor at 3.1cm of invasive carcinoma with mixed ductal and lobular features and DCIS components. She does have positive margins and 2/3 positive LNs. I personally reviewed path with patient today.  -I discussed Dr TsuPrince Soliancommend right mastectomy and axillary lymph node dissection, due to diffuse positive margin, and 2+ lymph nodes after  neoadjuvant treatment. She is willing to proceed with mastectomy. She is also open to reconstruction surgery and possible nipple tattooing. She is more concerned about more LN removal given side effects. I will discuss further LN Dissection with Dr Prince Solian.  -Given positive LNs, she will proceed with radiation after mastectomy to reduce her risk of local recurrence. -She will continue antiestrogen therapy with Anastrozole and Zoladex injection. Given she plans to travel after next surgery, she would like to hold injections and return to Tamoxifen. I discussed anastrozole is more effective in her case, but can return to Tamoxifen temporarily. She is agreeable.  -She does have seroma in right axilla and mild swelling around her right breast. I recommend PT. Labs reviewed, Hg 11.6, AST 51, ALT 77. Will proceed with Zoladex injection today -F/u in 4 weeks  2.GeneralizedWeakness, body pain,  Fatigue  -Pt notes for the past 3 years she has had mid chest and mid back pain. Previously intermittent, now constant. She also notes b/l arm and leg weakness and pain along with tingling of her toes and fingers.  -This has lead to fatigue, low appetite, decreased daily function/activity.  -Pt notes prior workup for this in [place] was negative with Neurologist and cardiologist. They said this is related to stress, anxiety or depression. Pt feels she has an illness causing this instead.  -Her 11/25/19 PET was negative for any distant malignancy or metastasis. -Symptoms of fatigue, gum bleeding and body pain and now recently mucus output with stool. Will continue to monitor.  3. Anxiety, depression  -Pt notes feeling more anxious (due to health before breast cancer), than depressed.  -Shewason Lexapro,I switched her toEffexor, she is tolerating better  4. Genetic Testingnegative for pathogenetic mutationsin RAD51D, andVariant of uncertain significance (VUS) detected inCHEK2at c.1556G>T (p.Arg519Leu). Nunzio Cory discussed with our genetic consoler  5. Social Support  -She is from [place] and lives both there and in [place] with her husband. She has 2 teenage children. The rest of her family is in [place].   PLAN: -Continue anastrozole  -Continue Zoladex injection every 4 weeks, will proceed with today.  -Proceed with mastectomy and right axillary lymph node dissection in April  -lab, f/u and Zoladex in 4 weeks.    No problem-specific Assessment & Plan notes found for this encounter.   No orders of the defined types were placed in this encounter.  All questions were answered. The patient knows to call the clinic with any problems, questions or concerns. No barriers to learning was detected. The total time spent in the appointment was 30 minutes.     Truitt Merle, MD 04/23/2020   I, Joslyn Devon, am acting as scribe for Truitt Merle, MD.   I have reviewed the above  documentation for accuracy and completeness, and I agree with the above.    Addendum  Her case was presented and discussed in our breast tumor board yesterday, the consensus from multiple surgeons and our medical oncologist group is right breast mastectomy and axillary lymph node dissection, due to her young age, 2 positive lymph nodes after neoadjuvant treatment.  I have called patient and left her message about the above consensus, and encouraged her to call if she has more questions.  Truitt Merle  04/29/2020

## 2020-04-23 NOTE — H&P (Signed)
History of Present Illness Caitlyn Miller. Caitlyn Lamia MD; 04/23/2020 1:31 PM) The patient is a 35 year old female who presents with breast cancer. Breast cancer MDC - 11/19/19 Caitlyn Miller PCP - Caitlyn Miller  This is a 35 year old [name] female who presents about one year s/p bilateral retropectoral breast implants by Caitlyn Miller. The patient states that her last mammogram was several years ago. Over the last six months, she has noticed an enlarging palpable abnormality in the right breast - upper outer quadrant. She underwent workup which showed a 3.8 cm area of calcifications at 1100 4 cmfn. [place] measured this at 4.4 x 1.4 x. 1.4 cm with two thickened lymph nodes in the right axilla. 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 Her lymph node was positive, and the tumor was ER/PR positive, HER-2 was negative with a Ki-67 of 5%.  She also reports pain in the mid chest and mid back pain, (onset 3 years ago) which was previously intermittent and now constant in the past 3 months. She also notes pain and weakness in her b/l arms and legs. Her pain is 7-8/10. She had work up with MRI in [place] in summer 2021 which was reportedly negative. She notes the weakness leads to decreased function and activity. Her weight is mostly stable, but she is eating less. She describes frequent pain in her epigastrium that radiates through to her back, usually after eating or swallowing medication. She describes nausea, heartburn, bloating. She states that she had an ultrasound in [place] that showed no gallstones. She is on Protonix.  MRI and PET scan showed no left breast or distal metastases. Mammogram showed low risk disease saw chemotherapy was not given. She started on neoadjuvant tamoxifen but tolerated this poorly. She was switched to anastrozole and is also receiving Zoladex injections monthly. She is  also on reduced dose of Verzenio as she was not able to tolerate the full dose. Ultrasound performed on 03/01/20 showed that the right breast mass now measures 1.2 x 0.9 x 0.6 cm. The axilla shows only normal sized lymph nodes. MRI performed last week showed interval resolution of the right axillary adenopathy. The right upper outer quadrant shows persistent non-mass enhancement spanning 4.6 x 4.5 x 2.0 cm.  I had an extended discussion with the patient and her husband. Clinically, the palpable mass has essentially resolved. I cannot really palpate a mass in the right upper outer quadrant. The clinically positive lymph nodes are now low no longer palpable. There is some concern with the persistent large area of non-mass enhancement in the right upper outer quadrant. At this time, it is difficult to determine the cause of this non-mass enhancement. This may be residual changes post-neoadjuvant treatment.  I recommended proceeding with a right radioactive seed localized lumpectomy. We will also perform a targeted axillary lymph node dissection to remove the previously biopsied lymph nodes as well as a sentinel lymph node biopsy using blue dye and radioactive tracer. They understand that if the margins of the lumpectomy comeback widely positive that she might need a larger surgery with possible mastectomy. On 04/15/20, she underwent bracketed right radioactive seed localized lumpectomy as well as targeted lymph node dissection and sentinel lymph node biopsy. Pathology showed a 3.1 cm grade 2 invasive carcinoma with mixed ductal and lobular features with associated DCIS. Invasive cancer broadly involves anterior, posterior, superior, inferior margins. DCIS  involved inferior margin. 3 lymph nodes were examined. 2 showed metastatic carcinoma.  I discussed the pathology report the patient and her husband by telephone today, today for postop check and further discussion. She has also been referred to  plastic surgery. They do not want to see the original plastic surgeon so she has been referred to Caitlyn Miller.     Problem List/Past Medical Caitlyn Key K. 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 Omaya Nieland, MD; 04/23/2020 1:31 PM) Breast Biopsy Bilateral.  Diagnostic Studies History Caitlyn Key K. Arlo Buffone, MD; 04/23/2020 1:31 PM) Colonoscopy 5-10 years ago Mammogram within last year Pap Smear 1-5 years ago  Allergies Caitlyn Lorenzo, LPN; 03/17/9516 84:16 AM) Ibuprofen *ANALGESICS - ANTI-INFLAMMATORY* Nausea, Vomiting. Allergies Reconciled  Medication History Caitlyn Lorenzo, LPN; 6/0/6301 60:10 AM) oxyCODONE HCl (5MG Tablet, Oral) Active. Gabapentin (100MG Capsule, Oral) Active. Arimidex (1MG Tablet, Oral) Active. Zofran (8MG Tablet, Oral) Active. Lexapro (10MG Tablet, Oral) Active. hydrOXYzine HCl (25MG Tablet, Oral) Active. Protonix (40MG Tablet DR, Oral) Active. Medications Reconciled  Social History Caitlyn Miller. Caitlyn Min, MD; 04/23/2020 1:31 PM) Caffeine use Coffee, Tea. No alcohol use No drug use Tobacco use Never smoker.  Family History Caitlyn Miller. Caitlyn Detter, MD; 04/23/2020 1:31 PM) Migraine Headache Mother.  Pregnancy / Birth History Caitlyn Miller. Caitlyn Cerniglia, MD; 04/23/2020 1:31 PM) Age at menarche 89 years. Contraceptive History Intrauterine device. Gravida 2 Length (months) of breastfeeding 12-24 Maternal age 20-20 Para 2  Other Problems Caitlyn Miller. Caitlyn Varma, MD; 04/23/2020 1:31 PM) Back Pain Chest pain Depression Gastroesophageal Reflux Disease     Physical Exam Caitlyn Key K. Candon Caras MD; 04/23/2020 1:32 PM)  The physical exam findings are as follows: Note:onstitutional: WDWN in NAD, conversant, no obvious deformities; resting comfortably Eyes: Pupils equal, round; sclera anicteric; moist conjunctiva; no lid lag HENT: Oral mucosa moist; good dentition Neck: No masses palpated,  trachea midline; no thyromegaly Lungs: CTA bilaterally; normal respiratory effort Breasts: bilateral implants via inframammary incisions; no palpable mass in either breast; no axillary lymphadenopathy on either side Healing right upper outer quadrant breast incision and right axillary incision with no sign of infection. CV: Regular rate and rhythm; no murmurs; extremities well-perfused with no edema Abd: +bowel sounds, soft, non-tender, no palpable organomegaly; no palpable hernias Musc: Normal gait; no apparent clubbing or cyanosis in extremities Lymphatic: No palpable cervical or axillary lymphadenopathy Skin: Warm, dry; no sign of jaundice Psychiatric - alert and oriented x 4; calm mood and affect    Assessment & Plan Caitlyn Key K. 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 The surgical procedure has been discussed with the patient. Potential risks, benefits, alternative treatments, and expected outcomes have been explained. All of the patient's questions at this time have been answered. The likelihood of reaching the patient's treatment goal is good. The patient understand the proposed surgical procedure and wishes to proceed. Note:I had a long discussion with the patient and her husband. At this point with some any positive margins and the positive axillary lymph nodes, we recommend modified radical mastectomy. The patient is very concerned about the possibility of edema after axillary dissection. She is scheduled to see physical therapy soon for follow-up. Questions remained about the management of her current breast implants as the patient will likely need radiation after surgery. She will see Caitlyn Miller as soon as possible and we will develop  a surgical plan. From oncologic standpoint she needs a mastectomy with axillary lymph node dissection. She briefly  discussed prophylactic mastectomy on the left but she does not seem to want to pursue this option at this time. We will await the results of her consultation with plastics before scheduling surgery. She is moderately concerned about the possibility of losing her right nipple with her mastectomy but she will discuss this further with plastics.  Caitlyn Miller. Georgette Dover, MD, [facility]  General/ Trauma Surgery   04/23/2020 1:34 PM

## 2020-04-24 ENCOUNTER — Other Ambulatory Visit: Payer: Self-pay | Admitting: Hematology

## 2020-04-26 ENCOUNTER — Inpatient Hospital Stay: Payer: 59

## 2020-04-26 ENCOUNTER — Inpatient Hospital Stay: Payer: 59 | Admitting: Hematology

## 2020-04-26 ENCOUNTER — Other Ambulatory Visit: Payer: Self-pay

## 2020-04-26 ENCOUNTER — Inpatient Hospital Stay: Payer: 59 | Attending: Hematology

## 2020-04-26 VITALS — BP 118/82 | HR 75 | Temp 97.8°F | Resp 18 | Ht 64.0 in | Wt 131.2 lb

## 2020-04-26 DIAGNOSIS — C50411 Malignant neoplasm of upper-outer quadrant of right female breast: Secondary | ICD-10-CM | POA: Diagnosis not present

## 2020-04-26 DIAGNOSIS — F419 Anxiety disorder, unspecified: Secondary | ICD-10-CM | POA: Insufficient documentation

## 2020-04-26 DIAGNOSIS — Z5111 Encounter for antineoplastic chemotherapy: Secondary | ICD-10-CM | POA: Insufficient documentation

## 2020-04-26 DIAGNOSIS — F32A Depression, unspecified: Secondary | ICD-10-CM | POA: Diagnosis not present

## 2020-04-26 DIAGNOSIS — M79645 Pain in left finger(s): Secondary | ICD-10-CM | POA: Insufficient documentation

## 2020-04-26 DIAGNOSIS — R5383 Other fatigue: Secondary | ICD-10-CM | POA: Insufficient documentation

## 2020-04-26 DIAGNOSIS — Z170 Estrogen receptor positive status [ER+]: Secondary | ICD-10-CM

## 2020-04-26 DIAGNOSIS — Z79811 Long term (current) use of aromatase inhibitors: Secondary | ICD-10-CM | POA: Diagnosis not present

## 2020-04-26 DIAGNOSIS — M79644 Pain in right finger(s): Secondary | ICD-10-CM | POA: Insufficient documentation

## 2020-04-26 LAB — CBC WITH DIFFERENTIAL (CANCER CENTER ONLY)
Abs Immature Granulocytes: 0.05 10*3/uL (ref 0.00–0.07)
Basophils Absolute: 0.1 10*3/uL (ref 0.0–0.1)
Basophils Relative: 1 %
Eosinophils Absolute: 0.3 10*3/uL (ref 0.0–0.5)
Eosinophils Relative: 4 %
HCT: 34.4 % — ABNORMAL LOW (ref 36.0–46.0)
Hemoglobin: 11.6 g/dL — ABNORMAL LOW (ref 12.0–15.0)
Immature Granulocytes: 1 %
Lymphocytes Relative: 42 %
Lymphs Abs: 3.3 10*3/uL (ref 0.7–4.0)
MCH: 32 pg (ref 26.0–34.0)
MCHC: 33.7 g/dL (ref 30.0–36.0)
MCV: 95 fL (ref 80.0–100.0)
Monocytes Absolute: 0.8 10*3/uL (ref 0.1–1.0)
Monocytes Relative: 10 %
Neutro Abs: 3.2 10*3/uL (ref 1.7–7.7)
Neutrophils Relative %: 42 %
Platelet Count: 295 10*3/uL (ref 150–400)
RBC: 3.62 MIL/uL — ABNORMAL LOW (ref 3.87–5.11)
RDW: 13.2 % (ref 11.5–15.5)
WBC Count: 7.7 10*3/uL (ref 4.0–10.5)
nRBC: 0 % (ref 0.0–0.2)

## 2020-04-26 LAB — CMP (CANCER CENTER ONLY)
ALT: 77 U/L — ABNORMAL HIGH (ref 0–44)
AST: 51 U/L — ABNORMAL HIGH (ref 15–41)
Albumin: 4.2 g/dL (ref 3.5–5.0)
Alkaline Phosphatase: 67 U/L (ref 38–126)
Anion gap: 11 (ref 5–15)
BUN: 10 mg/dL (ref 6–20)
CO2: 25 mmol/L (ref 22–32)
Calcium: 9.6 mg/dL (ref 8.9–10.3)
Chloride: 106 mmol/L (ref 98–111)
Creatinine: 0.72 mg/dL (ref 0.44–1.00)
GFR, Estimated: >60 mL/min (ref >60)
Glucose, Bld: 95 mg/dL (ref 70–99)
Potassium: 4.1 mmol/L (ref 3.5–5.1)
Sodium: 142 mmol/L (ref 135–145)
Total Bilirubin: 0.4 mg/dL (ref 0.3–1.2)
Total Protein: 7.4 g/dL (ref 6.5–8.1)

## 2020-04-26 MED ORDER — GOSERELIN ACETATE 3.6 MG ~~LOC~~ IMPL
3.6000 mg | DRUG_IMPLANT | Freq: Once | SUBCUTANEOUS | Status: AC
  Administered 2020-04-26: 3.6 mg via SUBCUTANEOUS

## 2020-04-26 MED ORDER — GOSERELIN ACETATE 3.6 MG ~~LOC~~ IMPL
DRUG_IMPLANT | SUBCUTANEOUS | Status: AC
  Filled 2020-04-26: qty 3.6

## 2020-04-26 NOTE — Patient Instructions (Signed)

## 2020-04-28 ENCOUNTER — Encounter: Payer: Self-pay | Admitting: *Deleted

## 2020-04-28 LAB — SURGICAL PATHOLOGY

## 2020-04-29 ENCOUNTER — Inpatient Hospital Stay: Payer: 59 | Admitting: Hematology

## 2020-04-29 ENCOUNTER — Encounter: Payer: Self-pay | Admitting: Hematology

## 2020-04-29 ENCOUNTER — Ambulatory Visit: Payer: 59 | Attending: Surgery

## 2020-04-29 ENCOUNTER — Other Ambulatory Visit: Payer: Self-pay

## 2020-04-29 ENCOUNTER — Inpatient Hospital Stay: Payer: 59

## 2020-04-29 DIAGNOSIS — Z170 Estrogen receptor positive status [ER+]: Secondary | ICD-10-CM | POA: Insufficient documentation

## 2020-04-29 DIAGNOSIS — Z483 Aftercare following surgery for neoplasm: Secondary | ICD-10-CM | POA: Diagnosis present

## 2020-04-29 DIAGNOSIS — M25611 Stiffness of right shoulder, not elsewhere classified: Secondary | ICD-10-CM | POA: Insufficient documentation

## 2020-04-29 DIAGNOSIS — C50411 Malignant neoplasm of upper-outer quadrant of right female breast: Secondary | ICD-10-CM | POA: Insufficient documentation

## 2020-04-29 DIAGNOSIS — R293 Abnormal posture: Secondary | ICD-10-CM | POA: Diagnosis present

## 2020-04-29 NOTE — Therapy (Signed)
[place], [place], [address] Phone: [phone]   Fax:  [phone]  Physical Therapy Treatment  Patient Details  Name: Bennie Scaff MRN: [id number] Date of Birth: [birth year] Referring Provider (PT): Dr. Rodman Key Tsuei/Feng   Encounter Date: 04/29/2020   PT End of Session - 04/29/20 1505    Visit Number 1    Number of Visits 6    Date for PT Re-Evaluation 05/20/20    PT Start Time 1402    PT Stop Time 1453    PT Time Calculation (min) 51 min    Activity Tolerance Patient limited by pain    Behavior During Therapy [facility] for tasks assessed/performed           Past Medical History:  Diagnosis Date  . Abnormal Pap smear   . Acid reflux   . Anemia   . Anxiety   . Breast cancer ([place])   . Decreased appetite 10/08  . Depression   . Endometriosis 10/2004  . Epigastric pain 10/2006  . Family history of breast cancer 11/19/2019  . FH: migraines   . GBS carrier   . H/O amenorrhea 06/2006  . H/O dyspareunia 7/07  . H/O fatigue   . H/O nausea and vomiting 10/2006  . H/O rubella   . H/O varicella   . Hyperemesis arising during pregnancy    First pregnancy  . Irregular periods/menstrual cycles 02/2005  . 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 Pelvic pain 09/2008    Past Surgical History:  Procedure Laterality Date  . AUGMENTATION MAMMAPLASTY Bilateral 0539   silicone   . BREAST LUMPECTOMY WITH RADIOACTIVE SEED LOCALIZATION Right 04/15/2020   Procedure: RIGHT BREAST LUMPECTOMY WITH RADIOACTIVE SEED LOCALIZATION;  Surgeon: Donnie Mesa, MD;  Location: [facility];  Service: General;  Laterality: Right;  . RADIOACTIVE SEED GUIDED AXILLARY SENTINEL LYMPH NODE Right 04/15/2020   Procedure: RADIOACTIVE SEED GUIDED RIGHT AXILLARY SENTINEL LYMPH NODE DISSECTION;  Surgeon: Donnie Mesa, MD;  Location: [facility];  Service: General;  Laterality: Right;  . SENTINEL NODE  BIOPSY N/A 04/15/2020   Procedure: SENTINEL NODE BIOPSY;  Surgeon: Donnie Mesa, MD;  Location: [facility];  Service: General;  Laterality: N/A;  . WISDOM TOOTH EXTRACTION      There were no vitals filed for this visit.   Subjective Assessment - 04/29/20 1405    Subjective Since 2 days after her surgery she developed pain and swelling inferior to axilla and it has continued to worsen.  Saw MD 2 days ago and they said it is normal. She has been trying to do the exercises given at last visit with Inez Catalina but she is having trouble. She is having a Right modified mastectomy on 05/18/2020 because they didn't get clear margins.  They are going to take about 10 LN's. She had negative genetic testing. Pt is having trouble cooking, showering, reaching with right UE.  Can sleep only with medications.  She is now having some sharp pains in the shoulder blade region. 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  She spends about 6 months/year in each place.  She has 2 children 10 and 12, and her mother is here from [place] to help her.    Pertinent History Patient was diagnosed on 11/05/2019 with right grade II invasive ductal carcinoma breast cancer. It measures 4.7 cm and is located in the upper outer quadrant. It is ER/PR positive and  HER2 negative with a Ki67 of 5%. She has a biopsied positive axillary lymph node. She had a Right lumpectomy with SLNB (2/3+) performed on 04/15/2020, and is now scheduled for a right modified mastectomy and expects to have 10 LN removed because of her young age and risk of reoccurence.    Patient Stated Goals Get back to normal use of right UE    Currently in Pain? Yes    Pain Score 7     Pain Location Axilla    Pain Orientation Right    Pain Descriptors / Indicators Sharp;Tightness;Sore    Pain Type Surgical pain    Pain Onset 1 to 4 weeks ago    Pain Frequency Constant    Aggravating Factors  using right UE, not sleeping well    Pain Relieving  Factors ice, pain meds    Effect of Pain on Daily Activities Limits most ADL's              [facility] PT Assessment - 04/29/20 0001      Assessment   Medical Diagnosis Right breast cancer    Referring Provider (PT) Dr. Rodman Key Tsuei/Feng    Onset Date/Surgical Date 11/05/19    Hand Dominance Right    Prior Therapy 1 visit      Precautions   Precaution Comments lymphedema risk/pending a mastectomy in May      Restrictions   Weight Bearing Restrictions No      Balance Screen   Has the patient fallen in the past 6 months No    Has the patient had a decrease in activity level because of a fear of falling?  No    Is the patient reluctant to leave their home because of a fear of falling?  No      Home Environment   Living Environment Private residence    Living Arrangements Spouse/significant other;Children    Available Help at Discharge Family      Prior Function   Level of [place] Unemployed    Leisure running, bike, walk      Cognition   Overall Cognitive Status Within Functional Limits for tasks assessed      Observation/Other Assessments   Observations incisions healed.  Fibrosis noted under proximal incision, multiple axillary cords visible and palpable      Sensation   Light Touch --   hypersensitive to light touch medial arm     Posture/Postural Control   Posture/Postural Control Postural limitations    Postural Limitations --   guarded on right and scapula protracted and elevated     ROM / Strength   AROM / PROM / Strength AROM      AROM   Right/Left Shoulder Right;Left    Right Shoulder Extension 35 Degrees    Right Shoulder Flexion 86 Degrees   pulls in axilla   Right Shoulder ABduction 60 Degrees    Left Shoulder Extension 48 Degrees    Left Shoulder Flexion 159 Degrees    Left Shoulder ABduction 178 Degrees    Left Shoulder Internal Rotation 50 Degrees    Left Shoulder External Rotation 90 Degrees      Strength    Overall Strength --   not assessed secondary to pain            LYMPHEDEMA/ONCOLOGY QUESTIONNAIRE - 04/29/20 0001      Type   Cancer Type Right breast Cancer      Surgeries   Lumpectomy Date 04/15/20  Sentinel Lymph Node Biopsy Date 04/15/20    Number Lymph Nodes Removed 3      Treatment   Active Chemotherapy Treatment No    Past Chemotherapy Treatment No    Active Radiation Treatment No    Past Radiation Treatment No    Current Hormone Treatment Yes    Drug Name Anastrazole    Past Hormone Therapy No      What other symptoms do you have   Are you Having Heaviness or Tightness Yes   right axillary region   Are you having Pain Yes    Are you having pitting edema No    Is it Hard or Difficult finding clothes that fit No    Do you have infections No    Is there Decreased scar mobility No    Stemmer Sign No      Lymphedema Assessments   Lymphedema Assessments Upper extremities      Right Upper Extremity Lymphedema   10 cm Proximal to Olecranon Process 24.6 cm    Olecranon Process 22.5 cm    10 cm Proximal to Ulnar Styloid Process 18.5 cm    Just Proximal to Ulnar Styloid Process 15.1 cm    At Base of 2nd Digit 6 cm              Quick Dash - 04/29/20 0001    Open a tight or new jar Severe difficulty    Do heavy household chores (wash walls, wash floors) Severe difficulty    Carry a shopping bag or briefcase Moderate difficulty    Wash your back Severe difficulty    Use a knife to cut food Moderate difficulty    Recreational activities in which you take some force or impact through your arm, shoulder, or hand (golf, hammering, tennis) Severe difficulty    During the past week, to what extent has your arm, shoulder or hand problem interfered with your normal social activities with family, friends, neighbors, or groups? Extremely    During the past week, to what extent has your arm, shoulder or hand problem limited your work or other regular daily activities Quite  a bit    Arm, shoulder, or hand pain. Severe    Tingling (pins and needles) in your arm, shoulder, or hand Severe    Difficulty Sleeping Moderate difficulty    DASH Score 70.45 %                          PT Education - 04/29/20 1504    Education Details pt was educated in 4 post op exs and to relax right arm while performing with left.  Advised that she do AA flex and stargazer in supine.    Person(s) Educated Patient    Methods Demonstration;Handout    Comprehension Returned demonstration               PT Long Term Goals - 04/29/20 1741      PT LONG TERM GOAL #1   Title Patient will demonstrate she has regained near  full shoulder ROM and function post operatively compared to baselines.    Time 3    Period Weeks    Status New    Target Date 05/20/20      PT LONG TERM GOAL #2   Title Pt will have decreased pain by atleast 50%    Time 3    Period Weeks    Status New  Target Date 05/20/20      PT LONG TERM GOAL #3   Title Pt will have quick dash no greater than 20% for return to function prior to next surgery    Baseline 70%    Time 3    Period Weeks    Status New    Target Date 05/20/20      PT LONG TERM GOAL #4   Title pt will be able to dress, bathe and perform home activities with improved ease    Time 3    Period Weeks    Status New    Target Date 05/20/20                 Plan - 04/29/20 1729    Clinical Impression Statement Pt had a right lumpectomy on 04/15/2020 with 2/3 LN positive.  She has to have a right  modified mastectomy on 05/18/2020 secondary to not having clear margins and per pt. hers is an unusual case.  They will likely take about 10 LN per pt. She presents today with significant pain and guarding of the right UE, with numerous axillary cords running into the upper arm and forearm.  Her axillary region and medial arm is very sensitive. She is very limited functionally and has decreased AROM of the right shoulder.  She was  instructed in 4 post op exercises again and advised to do shoulder flexion and stargazer in supine.  She was very guarded and required VC's to relax to allow her to improve her motion. She notes swelling at the axillary region that she feels increases and decreases.  She will benefit from skilled PT to address deficits and return pt to prior function. a piece of 1/4 inch foam was cut to place over upper incision to try and decrease fibrosis there.    Personal Factors and Comorbidities Comorbidity 2    Comorbidities Right lumpectomy pending right mastectomy    Examination-Activity Limitations Reach Overhead;Carry;Dressing;Hygiene/Grooming;Sleep    Examination-Participation Restrictions Cleaning;Meal Prep;Laundry    Stability/Clinical Decision Making Stable/Uncomplicated    Clinical Decision Making Low    Rehab Potential Good    PT Frequency 2x / week    PT Duration 3 weeks   Pt has another surgery on 05/18/20   PT Treatment/Interventions ADLs/Self Care Home Management;Therapeutic exercise;Patient/family education;Manual techniques;Manual lymph drainage;Scar mobilization;Passive range of motion    PT Next Visit Plan MFR for cording, STM to decrease guarding, review of HEP, PROM    PT Home Exercise Plan Post op shoulder ROM [id number] for flex and stargazer)    Recommended Other Services compression sleeve    Consulted and Agree with Plan of Care Patient           Patient will benefit from skilled therapeutic intervention in order to improve the following deficits and impairments:  Postural dysfunction,Decreased range of motion,Impaired UE functional use,Pain,Decreased knowledge of precautions,Decreased activity tolerance,Impaired sensation,Decreased strength,Increased fascial restricitons  Visit Diagnosis: Malignant neoplasm of upper-outer quadrant of right breast in female, estrogen receptor positive ([place])  Aftercare following surgery for neoplasm  Stiffness of right shoulder, not elsewhere  classified  Abnormal posture     Problem List Patient Active Problem List   Diagnosis Date Noted  . Menopausal syndrome (hot flushes) 12/23/2019  . 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 Genetic testing 11/26/2019  . Family history of breast cancer 11/19/2019  . Malignant neoplasm of upper-outer quadrant of right breast in female, estrogen receptor positive ([place]) 11/17/2019  . S/P breast  augmentation 09/18/2019  . Hx of migraines 06/20/2011  . GERD (gastroesophageal reflux disease) 06/20/2011  . Anemia 06/20/2011    Claris Pong 04/29/2020, 5:50 PM  [place], [place], [address] Phone: [phone]   Fax:  [phone]  Name: Holden Draughon MRN: [id number] Date of Birth: Feb 16, 1985 Cheral Almas, PT 04/29/20 5:55 PM

## 2020-04-29 NOTE — Patient Instructions (Signed)
Patient was instructed today in a home exercise program today for post op shoulder range of motion. These included active assist shoulder flexion in supine, scapular retraction, wall walking with shoulder abduction, and hands behind head external rotation in supine.  She was encouraged to do these twice a day, holding 3 seconds and repeating 5 times.  We also discussed gentle exercises like shoulder rolls and shrugs to try and get arm to relax

## 2020-05-03 ENCOUNTER — Telehealth: Payer: Self-pay | Admitting: *Deleted

## 2020-05-03 NOTE — Telephone Encounter (Signed)
Husband called to say that Caitlyn Miller wasn't feeling well. Has redness and swelling on her incision. Instructed him to call surgeon to see if they can see her today.  Spoke with him later, she is being seen at surgical center today.

## 2020-05-05 ENCOUNTER — Ambulatory Visit: Payer: 59

## 2020-05-05 ENCOUNTER — Other Ambulatory Visit: Payer: Self-pay

## 2020-05-05 DIAGNOSIS — M25611 Stiffness of right shoulder, not elsewhere classified: Secondary | ICD-10-CM

## 2020-05-05 DIAGNOSIS — Z170 Estrogen receptor positive status [ER+]: Secondary | ICD-10-CM

## 2020-05-05 DIAGNOSIS — R293 Abnormal posture: Secondary | ICD-10-CM

## 2020-05-05 DIAGNOSIS — Z483 Aftercare following surgery for neoplasm: Secondary | ICD-10-CM

## 2020-05-05 DIAGNOSIS — C50411 Malignant neoplasm of upper-outer quadrant of right female breast: Secondary | ICD-10-CM | POA: Diagnosis not present

## 2020-05-05 NOTE — Therapy (Signed)
[place], [place], [address] Phone: [phone]   Fax:  [phone]  Physical Therapy Treatment  Patient Details  Name: Caitlyn Miller MRN: [id number] Date of Birth: 1985-05-11 Referring Provider (PT): Dr. Rodman Key Tsuei/Feng   Encounter Date: 05/05/2020   PT End of Session - 05/05/20 1203    Visit Number 2    Number of Visits 6    Date for PT Re-Evaluation 05/20/20    PT Start Time 1101    PT Stop Time 1157    PT Time Calculation (min) 56 min    Activity Tolerance Patient tolerated treatment well    Behavior During Therapy [facility] for tasks assessed/performed           Past Medical History:  Diagnosis Date  . Abnormal Pap smear   . Acid reflux   . Anemia   . Anxiety   . Breast cancer ([place])   . Decreased appetite 10/08  . Depression   . Endometriosis 10/2004  . Epigastric pain 10/2006  . Family history of breast cancer 11/19/2019  . FH: migraines   . GBS carrier   . H/O amenorrhea 06/2006  . H/O dyspareunia 7/07  . H/O fatigue   . H/O nausea and vomiting 10/2006  . H/O rubella   . H/O varicella   . Hyperemesis arising during pregnancy    First pregnancy  . Irregular periods/menstrual cycles 02/2005  . 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 Pelvic pain 09/2008    Past Surgical History:  Procedure Laterality Date  . AUGMENTATION MAMMAPLASTY Bilateral 9150   silicone   . BREAST LUMPECTOMY WITH RADIOACTIVE SEED LOCALIZATION Right 04/15/2020   Procedure: RIGHT BREAST LUMPECTOMY WITH RADIOACTIVE SEED LOCALIZATION;  Surgeon: Donnie Mesa, MD;  Location: [place];  Service: General;  Laterality: Right;  . RADIOACTIVE SEED GUIDED AXILLARY SENTINEL LYMPH NODE Right 04/15/2020   Procedure: RADIOACTIVE SEED GUIDED RIGHT AXILLARY SENTINEL LYMPH NODE DISSECTION;  Surgeon: Donnie Mesa, MD;  Location: [place];  Service: General;  Laterality: Right;  .  SENTINEL NODE BIOPSY N/A 04/15/2020   Procedure: SENTINEL NODE BIOPSY;  Surgeon: Donnie Mesa, MD;  Location: [place];  Service: General;  Laterality: N/A;  . WISDOM TOOTH EXTRACTION      There were no vitals filed for this visit.   Subjective Assessment - 05/05/20 1105    Subjective I saw someone at Dr. Kellie Moor office Monday who gave me doxycycline for an infection because I started having a fever and the redness at the side of my breast was spreading. I am feeling much better now that I've been on that for 2 days and my motion is getting beter as the pain resolves.    Pertinent History Patient was diagnosed on 11/05/2019 with right grade II invasive ductal carcinoma breast cancer. It measures 4.7 cm and is located in the upper outer quadrant. It is ER/PR positive and HER2 negative with a Ki67 of 5%. She has a biopsied positive axillary lymph node. She had a Right lumpectomy with SLNB (2/3+) performed on 04/15/2020, and is now scheduled for a right modified mastectomy and expects to have 10 LN removed because of her young age and risk of reoccurence.    Patient Stated Goals Get back to normal use of right UE    Currently in Pain? Yes    Pain Score 4     Pain Location Axilla   at cords   Pain  Orientation Right    Pain Descriptors / Indicators Tightness    Pain Type Acute pain    Pain Onset 1 to 4 weeks ago    Pain Frequency Intermittent    Aggravating Factors  now just certain movements    Pain Relieving Factors started antibiotic Monday so has been getting better since then                             [facility] Adult PT Treatment/Exercise - 05/05/20 0001      Manual Therapy   Manual Therapy Myofascial release;Passive ROM    Myofascial Release In Supine to Rt axilla and at medial upper arm at areas of cording    Passive ROM In Supine to Rt shoulder into flexion, abduction and D2 to pts tolerance and available end ROMs                        PT Long Term Goals - 04/29/20 1741      PT LONG TERM GOAL #1   Title Patient will demonstrate she has regained near  full shoulder ROM and function post operatively compared to baselines.    Time 3    Period Weeks    Status New    Target Date 05/20/20      PT LONG TERM GOAL #2   Title Pt will have decreased pain by atleast 50%    Time 3    Period Weeks    Status New    Target Date 05/20/20      PT LONG TERM GOAL #3   Title Pt will have quick dash no greater than 20% for return to function prior to next surgery    Baseline 70%    Time 3    Period Weeks    Status New    Target Date 05/20/20      PT LONG TERM GOAL #4   Title pt will be able to dress, bathe and perform home activities with improved ease    Time 3    Period Weeks    Status New    Target Date 05/20/20                 Plan - 05/05/20 1203    Clinical Impression Statement Pt much improved since here last. Over the weekend she ended up developing a fever and redness started to spread towards lateral apsect of Rt breast so she went to Dr. Amada Kingfisher office and was seen by someone there (she doesn't remember who) and they started her on doxycycline and she reports redness and pain much improved since then as well as A/ROM has improved. Focused today on manual therapy including MFR to areas of cording at axilla and upper arm where 2 larger cords visible and palpable. Also included end P/ROM. Her cording improved well by end of session by not being as visible and was less restricting of end motions. Pt also reports her axilla feeling much looser and shoulder motion improved. Showed pt end ROM doorway stretches for her to focus on for next 2 weeks working to regain as much motion as possible before next surgery. She has one more visit with [place] next week before her modified radical mastectomy on 05/18/20.    Personal Factors and Comorbidities Comorbidity 2    Comorbidities Right lumpectomy pending right mastectomy     Examination-Activity Limitations Reach Overhead;Carry;Dressing;Hygiene/Grooming;Sleep    Examination-Participation Restrictions  Cleaning;Meal Prep;Laundry    Stability/Clinical Decision Making Stable/Uncomplicated    Rehab Potential Good    PT Frequency 2x / week    PT Duration 3 weeks   pt has another surgery on 05/18/20   PT Treatment/Interventions ADLs/Self Care Home Management;Therapeutic exercise;Patient/family education;Manual techniques;Manual lymph drainage;Scar mobilization;Passive range of motion    PT Next Visit Plan MFR for cording and P/ROM, review of HEP especially for post op    PT Home Exercise Plan Post op shoulder ROM HEP (supine for flex and stargazer)    Consulted and Agree with Plan of Care Patient           Patient will benefit from skilled therapeutic intervention in order to improve the following deficits and impairments:  Postural dysfunction,Decreased range of motion,Impaired UE functional use,Pain,Decreased knowledge of precautions,Decreased activity tolerance,Impaired sensation,Decreased strength,Increased fascial restricitons  Visit Diagnosis: Aftercare following surgery for neoplasm  Stiffness of right shoulder, not elsewhere classified  Abnormal posture  Malignant neoplasm of upper-outer quadrant of right breast in female, estrogen receptor positive ([place])     Problem List Patient Active Problem List   Diagnosis Date Noted  . Menopausal syndrome (hot flushes) 12/23/2019  . 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 Genetic testing 11/26/2019  . Family history of breast cancer 11/19/2019  . Malignant neoplasm of upper-outer quadrant of right breast in female, estrogen receptor positive ([place]) 11/17/2019  . S/P breast augmentation 09/18/2019  . Hx of migraines 06/20/2011  . GERD (gastroesophageal reflux disease) 06/20/2011  . Anemia 06/20/2011    Otelia Limes, PTA 05/05/2020, 12:11 PM  [place], [place], [address] Phone: [phone]   Fax:  [phone]  Name: Caitlyn Miller MRN: [id number] Date of Birth: 01/15/1986

## 2020-05-13 ENCOUNTER — Ambulatory Visit: Payer: 59

## 2020-05-13 ENCOUNTER — Other Ambulatory Visit: Payer: Self-pay

## 2020-05-13 DIAGNOSIS — C50411 Malignant neoplasm of upper-outer quadrant of right female breast: Secondary | ICD-10-CM | POA: Diagnosis not present

## 2020-05-13 DIAGNOSIS — M25611 Stiffness of right shoulder, not elsewhere classified: Secondary | ICD-10-CM

## 2020-05-13 DIAGNOSIS — Z483 Aftercare following surgery for neoplasm: Secondary | ICD-10-CM

## 2020-05-13 DIAGNOSIS — R293 Abnormal posture: Secondary | ICD-10-CM

## 2020-05-13 NOTE — Therapy (Signed)
[place], [place], [address] Phone: [phone]   Fax:  [phone]  Physical Therapy Treatment  Patient Details  Name: Caitlyn Miller MRN: [id number] Date of Birth: 01/16/1986 Referring Provider (PT): Dr. Rodman Key Tsuei/Feng   Encounter Date: 05/13/2020   PT End of Session - 05/13/20 0923    Visit Number 3    Number of Visits 6    Date for PT Re-Evaluation 05/20/20    PT Start Time 0804    PT Stop Time 0903    PT Time Calculation (min) 59 min    Activity Tolerance Patient tolerated treatment well    Behavior During Therapy [facility] for tasks assessed/performed           Past Medical History:  Diagnosis Date  . Abnormal Pap smear   . Acid reflux   . Anemia   . Anxiety   . Breast cancer ([place])   . Decreased appetite 10/08  . Depression   . Endometriosis 10/2004  . Epigastric pain 10/2006  . Family history of breast cancer 11/19/2019  . FH: migraines   . GBS carrier   . H/O amenorrhea 06/2006  . H/O dyspareunia 7/07  . H/O fatigue   . H/O nausea and vomiting 10/2006  . H/O rubella   . H/O varicella   . Hyperemesis arising during pregnancy    First pregnancy  . Irregular periods/menstrual cycles 02/2005  . Monoallelic mutation of ONG29B gene 12/19/2019  . Pelvic pain 09/2008    Past Surgical History:  Procedure Laterality Date  . AUGMENTATION MAMMAPLASTY Bilateral 2841   silicone   . BREAST LUMPECTOMY WITH RADIOACTIVE SEED LOCALIZATION Right 04/15/2020   Procedure: RIGHT BREAST LUMPECTOMY WITH RADIOACTIVE SEED LOCALIZATION;  Surgeon: Donnie Mesa, MD;  Location: [place];  Service: General;  Laterality: Right;  . RADIOACTIVE SEED GUIDED AXILLARY SENTINEL LYMPH NODE Right 04/15/2020   Procedure: RADIOACTIVE SEED GUIDED RIGHT AXILLARY SENTINEL LYMPH NODE DISSECTION;  Surgeon: Donnie Mesa, MD;  Location: [place];  Service: General;  Laterality: Right;  .  SENTINEL NODE BIOPSY N/A 04/15/2020   Procedure: SENTINEL NODE BIOPSY;  Surgeon: Donnie Mesa, MD;  Location: [place];  Service: General;  Laterality: N/A;  . WISDOM TOOTH EXTRACTION      There were no vitals filed for this visit.   Subjective Assessment - 05/13/20 0808    Subjective The cording has gotten so much better from all the stretches and I can sleep on my Rt side now also. Only problem now is my Rt breast feels stiff and hard.    Pertinent History Patient was diagnosed on 11/05/2019 with right grade II invasive ductal carcinoma breast cancer. It measures 4.7 cm and is located in the upper outer quadrant. It is ER/PR positive and HER2 negative with a Ki67 of 5%. She has a biopsied positive axillary lymph node. She had a Right lumpectomy with SLNB (2/3+) performed on 04/15/2020, and is now scheduled for a right modified mastectomy and expects to have 10 LN removed because of her young age and risk of reoccurence.    Patient Stated Goals Get back to normal use of right UE    Currently in Pain? No/denies                             [facility] Adult PT Treatment/Exercise - 05/13/20 0001      Manual Therapy  Manual Therapy Myofascial release;Passive ROM;Soft tissue mobilization    Manual therapy comments Assessed new onset of swelling at pts Rt breast. No redness or warmth to area so this appears to be normal post op edema and possible seroma as she did have an infection last week. Pt felt better after her questions were answered, plus she knows she has her mastectomy next week so if there is any other issue. Sent update to Dr. Georgette Dover as well per pt request.    Soft tissue mobilization With cocoa butter to Rt medial upper arm at area of tenderness from cording. Pt has dimpling here as well from cording.    Myofascial Release In Supine to Rt axilla and at medial upper arm at areas of cording    Passive ROM In Supine to Rt shoulder into flexion, abduction and D2  to pts tolerance and available end ROMs applying scapular depression throughout                       PT Long Term Goals - 05/13/20 0939      PT LONG TERM GOAL #1   Title Patient will demonstrate she has regained near  full shoulder ROM and function post operatively compared to baselines.    Baseline End ROM improved but still limited by cording- 05/13/20    Status On-going      PT LONG TERM GOAL #2   Title Pt will have decreased pain by atleast 50%    Baseline Pt reports no pain at this time as motion improves-05/13/20    Status Achieved      PT LONG TERM GOAL #3   Title Pt will have quick dash no greater than 20% for return to function prior to next surgery    Baseline 70%; pt did not retake this today but reports no longer feeling limited with reaching and ADLs-05/13/20    Status Partially Met      PT LONG TERM GOAL #4   Title pt will be able to dress, bathe and perform home activities with improved ease    Status Achieved                 Plan - 05/13/20 0924    Clinical Impression Statement Pt came in to clinic today c/o new onset of swelling at Rt breast. Upon inspection no redness or warmth present and skin appears healthy overall with no lingering signs of last weeks infection. There is increased palpable swelling with firmness at incision that could be a possible seroma. Per pts request updated Dr. Georgette Dover and he also thinks possible seroma and reports his nurse will call pt. She was worried surgery could be put off due ot new swelling but encouraged her that this would not be the case. She felt better after having questions answered today. Continued with MFR and P/ROM to Rt axilla and shoulder. Her cording is still limiting her end ROM but this is some improved from last week. Pt reports feeling some improvement as well. Encouraged her to cont working to improve her end motions as much as able before her surgery next week and then she will need to start back with  gentle post op exercises she was issued after lumpectomy. Also reviewed she will not be able to lift arm higher than 90 degrees with drain(s) in. Pt verbalized understanding all. She will be placed on hold now until after her mastectomy.    Personal Factors and Comorbidities Comorbidity 2  Comorbidities Right lumpectomy pending right mastectomy    Examination-Activity Limitations Reach Overhead;Carry;Dressing;Hygiene/Grooming;Sleep    Examination-Participation Restrictions Cleaning;Meal Prep;Laundry    Stability/Clinical Decision Making Stable/Uncomplicated    Rehab Potential Good    PT Frequency 2x / week    PT Duration 3 weeks   pt has another surgery on 05/18/20   PT Treatment/Interventions ADLs/Self Care Home Management;Therapeutic exercise;Patient/family education;Manual techniques;Manual lymph drainage;Scar mobilization;Passive range of motion    PT Next Visit Plan Pt on hold until after her mastectomy, her post op reassessment has been scheduled for 06/17/20.    Consulted and Agree with Plan of Care Patient           Patient will benefit from skilled therapeutic intervention in order to improve the following deficits and impairments:  Postural dysfunction,Decreased range of motion,Impaired UE functional use,Pain,Decreased knowledge of precautions,Decreased activity tolerance,Impaired sensation,Decreased strength,Increased fascial restricitons  Visit Diagnosis: Aftercare following surgery for neoplasm  Stiffness of right shoulder, not elsewhere classified  Abnormal posture  Malignant neoplasm of upper-outer quadrant of right breast in female, estrogen receptor positive ([place])     Problem List Patient Active Problem List   Diagnosis Date Noted  . Menopausal syndrome (hot flushes) 12/23/2019  . Monoallelic mutation of XNA35T gene 12/19/2019  . Genetic testing 11/26/2019  . Family history of breast cancer 11/19/2019  . Malignant neoplasm of upper-outer quadrant of right breast  in female, estrogen receptor positive ([place]) 11/17/2019  . S/P breast augmentation 09/18/2019  . Hx of migraines 06/20/2011  . GERD (gastroesophageal reflux disease) 06/20/2011  . Anemia 06/20/2011    Otelia Limes, PTA 05/13/2020, 9:47 AM  [place], [place], [address] Phone: [phone]   Fax:  [phone]  Name: Helem Reesor MRN: [id number] Date of Birth: July 21, 1985

## 2020-05-14 ENCOUNTER — Other Ambulatory Visit (HOSPITAL_COMMUNITY)
Admission: RE | Admit: 2020-05-14 | Discharge: 2020-05-14 | Disposition: A | Discharge status: Home / Self Care | Payer: 59 | Source: Ambulatory Visit | Attending: Surgery | Admitting: Surgery

## 2020-05-14 ENCOUNTER — Ambulatory Visit: Payer: Self-pay | Admitting: Surgery

## 2020-05-14 DIAGNOSIS — Z20822 Contact with and (suspected) exposure to covid-19: Secondary | ICD-10-CM | POA: Insufficient documentation

## 2020-05-14 DIAGNOSIS — Z01812 Encounter for preprocedural laboratory examination: Secondary | ICD-10-CM | POA: Insufficient documentation

## 2020-05-14 LAB — SARS CORONAVIRUS 2 (TAT 6-24 HRS): SARS Coronavirus 2: NEGATIVE

## 2020-05-17 ENCOUNTER — Encounter (HOSPITAL_COMMUNITY): Payer: Self-pay | Admitting: Surgery

## 2020-05-17 ENCOUNTER — Other Ambulatory Visit (HOSPITAL_COMMUNITY): Payer: Self-pay

## 2020-05-17 MED FILL — Anastrozole Tab 1 MG: ORAL | 30 days supply | Qty: 30 | Fill #0 | Status: AC

## 2020-05-17 NOTE — Progress Notes (Signed)
Unable to reach patient via phone.  Left a detailed message on machine with instructions for DOS.  PCP - Dr Grant Fontana Cardiologist - n/a Oncology - Dr Truitt Merle  Chest x-ray - n/a EKG - n/a Stress Test - n/a ECHO - n/a Cardiac Cath - n/a  ERAS: Clear liquids til 10:15 am on DOS  STOP now taking any Aspirin (unless otherwise instructed by your surgeon), Aleve, Naproxen, Ibuprofen, Motrin, Advil, Goody's, BC's, all herbal medications, fish oil, and all vitamins.   Coronavirus Screening Covid test on 05/14/20 was negative.

## 2020-05-18 ENCOUNTER — Encounter (HOSPITAL_COMMUNITY)
Admission: RE | Disposition: A | Discharge status: Home / Self Care | Payer: Self-pay | Source: Home / Self Care | Attending: Surgery

## 2020-05-18 ENCOUNTER — Ambulatory Visit (HOSPITAL_COMMUNITY): Payer: 59 | Admitting: Anesthesiology

## 2020-05-18 ENCOUNTER — Other Ambulatory Visit: Payer: Self-pay

## 2020-05-18 ENCOUNTER — Encounter (HOSPITAL_COMMUNITY): Payer: Self-pay | Admitting: Surgery

## 2020-05-18 ENCOUNTER — Observation Stay (HOSPITAL_COMMUNITY)
Admission: RE | Admit: 2020-05-18 | Discharge: 2020-05-20 | Disposition: A | Discharge status: Home / Self Care | Payer: 59 | Attending: Surgery | Admitting: Surgery

## 2020-05-18 DIAGNOSIS — C50911 Malignant neoplasm of unspecified site of right female breast: Secondary | ICD-10-CM | POA: Diagnosis present

## 2020-05-18 DIAGNOSIS — C773 Secondary and unspecified malignant neoplasm of axilla and upper limb lymph nodes: Secondary | ICD-10-CM | POA: Diagnosis not present

## 2020-05-18 DIAGNOSIS — R0602 Shortness of breath: Secondary | ICD-10-CM

## 2020-05-18 HISTORY — PX: MODIFIED MASTECTOMY: SHX5268

## 2020-05-18 HISTORY — PX: BREAST IMPLANT REMOVAL: SHX5361

## 2020-05-18 LAB — POCT PREGNANCY, URINE: Preg Test, Ur: NEGATIVE

## 2020-05-18 LAB — CBC
HCT: 36.3 % (ref 36.0–46.0)
Hemoglobin: 12 g/dL (ref 12.0–15.0)
MCH: 31.8 pg (ref 26.0–34.0)
MCHC: 33.1 g/dL (ref 30.0–36.0)
MCV: 96.3 fL (ref 80.0–100.0)
Platelets: 287 10*3/uL (ref 150–400)
RBC: 3.77 MIL/uL — ABNORMAL LOW (ref 3.87–5.11)
RDW: 12.2 % (ref 11.5–15.5)
WBC: 6 10*3/uL (ref 4.0–10.5)
nRBC: 0 % (ref 0.0–0.2)

## 2020-05-18 SURGERY — MODIFIED MASTECTOMY
Anesthesia: Regional | Site: Breast | Laterality: Right

## 2020-05-18 MED ORDER — LIDOCAINE 2% (20 MG/ML) 5 ML SYRINGE
INTRAMUSCULAR | Status: AC
  Filled 2020-05-18: qty 5

## 2020-05-18 MED ORDER — OXYCODONE HCL 5 MG PO TABS
ORAL_TABLET | ORAL | Status: AC
  Filled 2020-05-18: qty 1

## 2020-05-18 MED ORDER — CHLORHEXIDINE GLUCONATE CLOTH 2 % EX PADS: 6.0000 | MEDICATED_PAD | Freq: Once | CUTANEOUS | Status: DC

## 2020-05-18 MED ORDER — MORPHINE SULFATE (PF) 2 MG/ML IV SOLN
2.0000 mg | INTRAVENOUS | Status: DC | PRN
  Administered 2020-05-19: 2 mg via INTRAVENOUS
  Filled 2020-05-18: qty 1

## 2020-05-18 MED ORDER — DOCUSATE SODIUM 100 MG PO CAPS
100.0000 mg | ORAL_CAPSULE | Freq: Two times a day (BID) | ORAL | Status: DC
  Administered 2020-05-18 – 2020-05-20 (×4): 100 mg via ORAL
  Filled 2020-05-18 (×4): qty 1

## 2020-05-18 MED ORDER — FENTANYL CITRATE (PF) 100 MCG/2ML IJ SOLN
25.0000 ug | INTRAMUSCULAR | Status: DC | PRN
  Administered 2020-05-18 (×3): 50 ug via INTRAVENOUS

## 2020-05-18 MED ORDER — PROPOFOL 10 MG/ML IV BOLUS
INTRAVENOUS | Status: DC | PRN
  Administered 2020-05-18: 150 mg via INTRAVENOUS

## 2020-05-18 MED ORDER — DIPHENHYDRAMINE HCL 50 MG/ML IJ SOLN
INTRAMUSCULAR | Status: AC
  Filled 2020-05-18: qty 1

## 2020-05-18 MED ORDER — CHLORHEXIDINE GLUCONATE 0.12 % MT SOLN
15.0000 mL | Freq: Once | OROMUCOSAL | Status: AC
  Administered 2020-05-18: 15 mL via OROMUCOSAL
  Filled 2020-05-18: qty 15

## 2020-05-18 MED ORDER — SCOPOLAMINE 1 MG/3DAYS TD PT72
MEDICATED_PATCH | TRANSDERMAL | Status: AC
  Administered 2020-05-18: 1.5 mg via TRANSDERMAL
  Filled 2020-05-18: qty 1

## 2020-05-18 MED ORDER — CEFAZOLIN SODIUM-DEXTROSE 2-4 GM/100ML-% IV SOLN
2.0000 g | INTRAVENOUS | Status: AC
  Administered 2020-05-18: 2 g via INTRAVENOUS
  Filled 2020-05-18: qty 100

## 2020-05-18 MED ORDER — ACETAMINOPHEN 500 MG PO TABS
1000.0000 mg | ORAL_TABLET | ORAL | Status: AC
  Administered 2020-05-18: 1000 mg via ORAL
  Filled 2020-05-18: qty 2

## 2020-05-18 MED ORDER — ENOXAPARIN SODIUM 40 MG/0.4ML IJ SOSY
40.0000 mg | PREFILLED_SYRINGE | INTRAMUSCULAR | Status: DC
  Administered 2020-05-19 – 2020-05-20 (×2): 40 mg via SUBCUTANEOUS
  Filled 2020-05-18 (×2): qty 0.4

## 2020-05-18 MED ORDER — FENTANYL CITRATE (PF) 100 MCG/2ML IJ SOLN
50.0000 ug | Freq: Once | INTRAMUSCULAR | Status: AC
Start: 2020-05-18 — End: 2020-05-18

## 2020-05-18 MED ORDER — DEXAMETHASONE SODIUM PHOSPHATE 10 MG/ML IJ SOLN
INTRAMUSCULAR | Status: DC | PRN
  Administered 2020-05-18: 5 mg

## 2020-05-18 MED ORDER — OXYCODONE HCL 5 MG/5ML PO SOLN: 5.0000 mg | Freq: Once | ORAL | Status: DC | PRN

## 2020-05-18 MED ORDER — LIDOCAINE HCL (CARDIAC) PF 100 MG/5ML IV SOSY
PREFILLED_SYRINGE | INTRAVENOUS | Status: DC | PRN
  Administered 2020-05-18: 60 mg via INTRAVENOUS
  Administered 2020-05-18: 40 mg via INTRAVENOUS

## 2020-05-18 MED ORDER — DIPHENHYDRAMINE HCL 50 MG/ML IJ SOLN
12.5000 mg | Freq: Once | INTRAMUSCULAR | Status: AC
  Administered 2020-05-18: 12.5 mg via INTRAVENOUS

## 2020-05-18 MED ORDER — FENTANYL CITRATE (PF) 100 MCG/2ML IJ SOLN
INTRAMUSCULAR | Status: DC | PRN
  Administered 2020-05-18 (×3): 25 ug via INTRAVENOUS
  Administered 2020-05-18: 50 ug via INTRAVENOUS
  Administered 2020-05-18: 25 ug via INTRAVENOUS

## 2020-05-18 MED ORDER — ONDANSETRON HCL 4 MG/2ML IJ SOLN
INTRAMUSCULAR | Status: DC | PRN
  Administered 2020-05-18: 4 mg via INTRAVENOUS

## 2020-05-18 MED ORDER — DIPHENHYDRAMINE HCL 25 MG PO CAPS
25.0000 mg | ORAL_CAPSULE | Freq: Four times a day (QID) | ORAL | Status: DC | PRN
  Administered 2020-05-19: 25 mg via ORAL
  Filled 2020-05-18: qty 1

## 2020-05-18 MED ORDER — CEFAZOLIN SODIUM-DEXTROSE 2-4 GM/100ML-% IV SOLN
2.0000 g | Freq: Three times a day (TID) | INTRAVENOUS | Status: AC
  Administered 2020-05-18: 2 g via INTRAVENOUS
  Filled 2020-05-18: qty 100

## 2020-05-18 MED ORDER — ONDANSETRON 4 MG PO TBDP
4.0000 mg | ORAL_TABLET | Freq: Four times a day (QID) | ORAL | Status: DC | PRN
  Administered 2020-05-20: 4 mg via ORAL
  Filled 2020-05-18: qty 1

## 2020-05-18 MED ORDER — FENTANYL CITRATE (PF) 100 MCG/2ML IJ SOLN
INTRAMUSCULAR | Status: AC
  Filled 2020-05-18: qty 2

## 2020-05-18 MED ORDER — LACTATED RINGERS IV SOLN: INTRAVENOUS | Status: DC

## 2020-05-18 MED ORDER — MIDAZOLAM HCL 2 MG/2ML IJ SOLN
INTRAMUSCULAR | Status: AC
  Administered 2020-05-18: 2 mg
  Filled 2020-05-18: qty 2

## 2020-05-18 MED ORDER — DEXAMETHASONE SODIUM PHOSPHATE 4 MG/ML IJ SOLN
INTRAMUSCULAR | Status: DC | PRN
  Administered 2020-05-18: 5 mg via INTRAVENOUS

## 2020-05-18 MED ORDER — GABAPENTIN 100 MG PO CAPS
200.0000 mg | ORAL_CAPSULE | Freq: Every day | ORAL | Status: DC
  Administered 2020-05-18 – 2020-05-19 (×2): 200 mg via ORAL
  Filled 2020-05-18 (×2): qty 2

## 2020-05-18 MED ORDER — ANASTROZOLE 1 MG PO TABS
1.0000 mg | ORAL_TABLET | Freq: Every day | ORAL | Status: DC
  Administered 2020-05-18 – 2020-05-19 (×2): 1 mg via ORAL
  Filled 2020-05-18 (×3): qty 1

## 2020-05-18 MED ORDER — HEMOSTATIC AGENTS (NO CHARGE) OPTIME
TOPICAL | Status: DC | PRN
  Administered 2020-05-18 (×2): 1

## 2020-05-18 MED ORDER — VENLAFAXINE HCL 37.5 MG PO TABS
37.5000 mg | ORAL_TABLET | Freq: Every day | ORAL | Status: DC
  Administered 2020-05-18 – 2020-05-20 (×3): 37.5 mg via ORAL
  Filled 2020-05-18 (×3): qty 1

## 2020-05-18 MED ORDER — OXYCODONE HCL 5 MG PO TABS: 5.0000 mg | ORAL_TABLET | Freq: Once | ORAL | Status: DC | PRN

## 2020-05-18 MED ORDER — FENTANYL CITRATE (PF) 100 MCG/2ML IJ SOLN
INTRAMUSCULAR | Status: AC
  Administered 2020-05-18: 50 ug
  Filled 2020-05-18: qty 2

## 2020-05-18 MED ORDER — ORAL CARE MOUTH RINSE: 15.0000 mL | Freq: Once | OROMUCOSAL | Status: AC

## 2020-05-18 MED ORDER — ONDANSETRON HCL 4 MG/2ML IJ SOLN
INTRAMUSCULAR | Status: AC
  Filled 2020-05-18: qty 2

## 2020-05-18 MED ORDER — KETOROLAC TROMETHAMINE 30 MG/ML IJ SOLN
INTRAMUSCULAR | Status: DC | PRN
  Administered 2020-05-18: 30 mg via INTRAVENOUS

## 2020-05-18 MED ORDER — KETOROLAC TROMETHAMINE 30 MG/ML IJ SOLN
INTRAMUSCULAR | Status: AC
  Filled 2020-05-18: qty 1

## 2020-05-18 MED ORDER — OXYCODONE HCL 5 MG PO TABS
5.0000 mg | ORAL_TABLET | ORAL | Status: DC | PRN
  Administered 2020-05-18: 10 mg via ORAL

## 2020-05-18 MED ORDER — AMISULPRIDE (ANTIEMETIC) 5 MG/2ML IV SOLN: 10.0000 mg | Freq: Once | INTRAVENOUS | Status: DC | PRN

## 2020-05-18 MED ORDER — MIDAZOLAM HCL 2 MG/2ML IJ SOLN
INTRAMUSCULAR | Status: AC
  Filled 2020-05-18: qty 2

## 2020-05-18 MED ORDER — ONDANSETRON HCL 4 MG/2ML IJ SOLN
4.0000 mg | Freq: Once | INTRAMUSCULAR | Status: AC
  Administered 2020-05-18: 4 mg via INTRAVENOUS

## 2020-05-18 MED ORDER — PROMETHAZINE HCL 25 MG/ML IJ SOLN
6.2500 mg | INTRAMUSCULAR | Status: DC | PRN
Start: 2020-05-18 — End: 2020-05-18

## 2020-05-18 MED ORDER — ONDANSETRON HCL 4 MG/2ML IJ SOLN
4.0000 mg | Freq: Four times a day (QID) | INTRAMUSCULAR | Status: DC | PRN
  Administered 2020-05-19: 4 mg via INTRAVENOUS
  Filled 2020-05-18: qty 2

## 2020-05-18 MED ORDER — TRAMADOL HCL 50 MG PO TABS
50.0000 mg | ORAL_TABLET | Freq: Four times a day (QID) | ORAL | Status: DC | PRN
  Administered 2020-05-19 – 2020-05-20 (×2): 50 mg via ORAL
  Filled 2020-05-18 (×2): qty 1

## 2020-05-18 MED ORDER — DEXAMETHASONE SODIUM PHOSPHATE 10 MG/ML IJ SOLN
INTRAMUSCULAR | Status: AC
  Filled 2020-05-18: qty 1

## 2020-05-18 MED ORDER — SCOPOLAMINE 1 MG/3DAYS TD PT72: 1.0000 | MEDICATED_PATCH | TRANSDERMAL | Status: DC

## 2020-05-18 MED ORDER — MIDAZOLAM HCL 2 MG/2ML IJ SOLN: 2.0000 mg | Freq: Once | INTRAMUSCULAR | Status: AC

## 2020-05-18 MED ORDER — PROPOFOL 10 MG/ML IV BOLUS
INTRAVENOUS | Status: AC
  Filled 2020-05-18: qty 20

## 2020-05-18 MED ORDER — EPHEDRINE 5 MG/ML INJ
INTRAVENOUS | Status: AC
  Filled 2020-05-18: qty 10

## 2020-05-18 MED ORDER — POTASSIUM CHLORIDE IN NACL 20-0.9 MEQ/L-% IV SOLN
INTRAVENOUS | Status: DC
  Filled 2020-05-18 (×2): qty 1000

## 2020-05-18 MED ORDER — DIPHENHYDRAMINE HCL 50 MG/ML IJ SOLN: 25.0000 mg | Freq: Four times a day (QID) | INTRAMUSCULAR | Status: DC | PRN

## 2020-05-18 MED ORDER — ROPIVACAINE HCL 5 MG/ML IJ SOLN
INTRAMUSCULAR | Status: DC | PRN
  Administered 2020-05-18: 30 mL via PERINEURAL

## 2020-05-18 MED ORDER — 0.9 % SODIUM CHLORIDE (POUR BTL) OPTIME
TOPICAL | Status: DC | PRN
  Administered 2020-05-18: 1000 mL

## 2020-05-18 MED ORDER — SUCRALFATE 1 G PO TABS: 1.0000 g | ORAL_TABLET | Freq: Every day | ORAL | Status: DC | PRN

## 2020-05-18 MED ORDER — FENTANYL CITRATE (PF) 250 MCG/5ML IJ SOLN
INTRAMUSCULAR | Status: AC
  Filled 2020-05-18: qty 5

## 2020-05-18 MED ORDER — EPHEDRINE SULFATE 50 MG/ML IJ SOLN
INTRAMUSCULAR | Status: DC | PRN
  Administered 2020-05-18 (×3): 10 mg via INTRAVENOUS

## 2020-05-18 MED ORDER — ACETAMINOPHEN 500 MG PO TABS
1000.0000 mg | ORAL_TABLET | Freq: Four times a day (QID) | ORAL | Status: DC
  Administered 2020-05-18 – 2020-05-20 (×5): 1000 mg via ORAL
  Filled 2020-05-18 (×7): qty 2

## 2020-05-18 SURGICAL SUPPLY — 41 items
APL PRP STRL LF DISP 70% ISPRP (MISCELLANEOUS) ×1
APL SKNCLS STERI-STRIP NONHPOA (GAUZE/BANDAGES/DRESSINGS) ×1
APPLIER CLIP 9.375 MED OPEN (MISCELLANEOUS) ×2
APR CLP MED 9.3 20 MLT OPN (MISCELLANEOUS) ×1
BENZOIN TINCTURE PRP APPL 2/3 (GAUZE/BANDAGES/DRESSINGS) ×1 IMPLANT
BINDER BREAST LRG (GAUZE/BANDAGES/DRESSINGS) ×1 IMPLANT
CANISTER SUCT 3000ML PPV (MISCELLANEOUS) ×2 IMPLANT
CHLORAPREP W/TINT 26 (MISCELLANEOUS) ×2 IMPLANT
CLIP APPLIE 9.375 MED OPEN (MISCELLANEOUS) ×1 IMPLANT
COVER SURGICAL LIGHT HANDLE (MISCELLANEOUS) ×2 IMPLANT
DRAIN CHANNEL 19F RND (DRAIN) ×4 IMPLANT
DRAPE CHEST BREAST 15X10 FENES (DRAPES) ×1 IMPLANT
DRSG PAD ABDOMINAL 8X10 ST (GAUZE/BANDAGES/DRESSINGS) ×1 IMPLANT
ELECT BLADE 4.0 EZ CLEAN MEGAD (MISCELLANEOUS) ×2
ELECT CAUTERY BLADE 6.4 (BLADE) IMPLANT
ELECT REM PT RETURN 9FT ADLT (ELECTROSURGICAL) ×2
ELECTRODE BLDE 4.0 EZ CLN MEGD (MISCELLANEOUS) IMPLANT
ELECTRODE REM PT RTRN 9FT ADLT (ELECTROSURGICAL) ×1 IMPLANT
EVACUATOR SILICONE 100CC (DRAIN) ×4 IMPLANT
GAUZE SPONGE 4X4 12PLY STRL (GAUZE/BANDAGES/DRESSINGS) ×2 IMPLANT
GLOVE BIO SURGEON STRL SZ7 (GLOVE) ×2 IMPLANT
GLOVE BIOGEL PI IND STRL 7.5 (GLOVE) ×1 IMPLANT
GLOVE BIOGEL PI INDICATOR 7.5 (GLOVE) ×1
GOWN STRL REUS W/ TWL LRG LVL3 (GOWN DISPOSABLE) ×2 IMPLANT
GOWN STRL REUS W/TWL LRG LVL3 (GOWN DISPOSABLE) ×4
HEMOSTAT ARISTA ABSORB 3G PWDR (HEMOSTASIS) ×2 IMPLANT
KIT BASIN OR (CUSTOM PROCEDURE TRAY) ×2 IMPLANT
KIT TURNOVER KIT B (KITS) ×2 IMPLANT
NS IRRIG 1000ML POUR BTL (IV SOLUTION) ×2 IMPLANT
PACK GENERAL/GYN (CUSTOM PROCEDURE TRAY) ×2 IMPLANT
PAD ARMBOARD 7.5X6 YLW CONV (MISCELLANEOUS) ×2 IMPLANT
PENCIL SMOKE EVACUATOR (MISCELLANEOUS) ×2 IMPLANT
SPECIMEN JAR X LARGE (MISCELLANEOUS) ×2 IMPLANT
SPONGE LAP 18X18 RF (DISPOSABLE) ×1 IMPLANT
STRIP CLOSURE SKIN 1/2X4 (GAUZE/BANDAGES/DRESSINGS) ×1 IMPLANT
SUT ETHILON 3 0 FSL (SUTURE) ×4 IMPLANT
SUT MNCRL AB 4-0 PS2 18 (SUTURE) ×1 IMPLANT
SUT VIC AB 2-0 SH 18 (SUTURE) ×3 IMPLANT
SUT VIC AB 3-0 SH 18 (SUTURE) ×5 IMPLANT
TOWEL GREEN STERILE (TOWEL DISPOSABLE) ×2 IMPLANT
TOWEL GREEN STERILE FF (TOWEL DISPOSABLE) ×2 IMPLANT

## 2020-05-18 NOTE — Op Note (Signed)
Preop diagnosis: Invasive ductal carcinoma right breast with axillary lymph node metastases Postop diagnosis: Same Procedure performed: Right modified radical mastectomy, removal of right breast retropectoral implant Surgeon:Nicolette Gieske K Stpehen Petitjean Assistant: Dr. Wynelle Link I was personally present during the key and critical portions of this procedure and immediately available throughout the entire procedure, as documented in my operative note. Anesthesia: General via LMA with pectoralis block Indications: This is a 35 year old female status post retropectoral breast implants who developed right upper outer quadrant breast cancer with metastases to the axillary lymph nodes.  She has undergone some neoadjuvant therapy.  We attempted breast conserving surgery but the lumpectomy specimen had multiple positive margins as well as persistent positive axillary lymph nodes.  She presents now for modified radical mastectomy and removal of her implants.  Description of procedure: The patient is brought to the operating room and placed in the supine position on the operating room table.  After an adequate level of general anesthesia was obtained, her right chest axilla and upper arm were prepped with ChloraPrep and draped in sterile fashion.  A timeout was taken to ensure the proper patient and proper procedure.  I outlined an elliptical incision that was oriented in an oblique fashion to include the previous lumpectomy scar in the right upper outer quadrant.  We used a #10 blade to raise her skin incisions.  We then raised skin flaps superiorly and inferiorly.  The superior border was the chest wall below the clavicle.  Medially we dissected to the edge of the sternum.  Inferiorly we dissected through some of the scar tissue from her previous mammoplasty and dissected down to the inframammary crease.  We identified the capsule of the implant.  We open the capsule and remove the implant which was entirely intact.  We  excised part of the capsule along with the specimen.  We then began dissecting the breast tissue off of the chest wall from medial to lateral.  Inferiorly we dissected until we saw the anterior border of the latissimus muscle.  We then dissected superiorly towards the axilla.  She has a previous axillary incision from her recent targeted lymph node dissection.  I made the decision to take the specimen in 2 separate pieces.  We came across axillary tail and remove the mastectomy specimen.  This was oriented with a suture lateral.  We inspected the chest wall for hemostasis.  We then repositioned our retractors and performed a right axillary lymph node dissection.  We dissected the edge of the pectoralis muscle and entered the axillary contents.  We dissected superiorly until we identified the axillary vein.  There is a small branch of the vein that we ligated with a clip.  We then dissected the axillary contents away from the chest wall.  The thoracodorsal bundle and the long thoracic neurovascular bundle were left intact.  The specimen was oriented with a suture at its deepest apex near the axillary vein.  This was sent for pathologic examination.  We then irrigated the wound thoroughly and inspected for hemostasis.  2 drains were placed through small stab incisions.  One was cut short and placed up in the axilla.  The other was placed across the mastectomy site.  Both of these was secured with nylon suture.  We then sprayed Arista powder across the entire operative field.  The wound was then closed with multiple interrupted subcutaneous 3-0 Vicryl sutures.  4-0 Monocryl was used to close the skin incisions.  Benzoin and Steri-Strips were applied.  The drains were placed to suction.  The patient was then extubated and brought to the recovery room in stable condition.  All sponge, instrument, and needle counts are correct.  Caitlyn Miller. Georgette Dover, MD, [facility]  General/ Trauma  Surgery   05/18/2020 3:42 PM

## 2020-05-18 NOTE — Transfer of Care (Signed)
Immediate Anesthesia Transfer of Care Note  Patient: Caitlyn Miller  Procedure(s) Performed: RIGHT MODIFIED RADICAL MASTECTOMY (Right Breast) REMOVAL OF RIGHT BREAST IMPLANT (Right Breast)  Patient Location: PACU  Anesthesia Type:MAC combined with regional for post-op pain  Level of Consciousness: drowsy and patient cooperative  Airway & Oxygen Therapy: Patient Spontanous Breathing and Patient connected to nasal cannula oxygen  Post-op Assessment: Report given to RN and Post -op Vital signs reviewed and stable  Post vital signs: Reviewed and stable  Last Vitals:  Vitals Value Taken Time  BP 114/71 05/18/20 1621  Temp    Pulse 101 05/18/20 1623  Resp 14 05/18/20 1623  SpO2 100 % 05/18/20 1623  Vitals shown include unvalidated device data.  Last Pain:  Vitals:   05/18/20 1255  TempSrc:   PainSc: 0-No pain      Patients Stated Pain Goal: 5 (38/46/65 9935)  Complications: No complications documented.

## 2020-05-18 NOTE — Anesthesia Procedure Notes (Signed)
Anesthesia Regional Block: Pectoralis block   Pre-Anesthetic Checklist: ,, timeout performed, Correct Patient, Correct Site, Correct Laterality, Correct Procedure, Correct Position, site marked, Risks and benefits discussed,  Surgical consent,  Pre-op evaluation,  At surgeon's request and post-op pain management  Laterality: Right  Prep: Dura Prep       Needles:  Injection technique: Single-shot  Needle Type: Echogenic Stimulator Needle     Needle Length: 5cm  Needle Gauge: 20     Additional Needles:   Procedures:,,,, ultrasound used (permanent image in chart),,,,  Narrative:  Start time: 05/18/2020 12:40 PM End time: 05/18/2020 12:43 PM Injection made incrementally with aspirations every 5 mL.  Performed by: Personally  Anesthesiologist: Darral Dash, DO  Additional Notes: Patient identified. Risks/Benefits/Options discussed with patient including but not limited to bleeding, infection, nerve damage, failed block, incomplete pain control. Patient expressed understanding and wished to proceed. All questions were answered. Sterile technique was used throughout the entire procedure. Please see nursing notes for vital signs. Aspirated in 5cc intervals with injection for negative confirmation. Patient was given instructions on fall risk and not to get out of bed. All questions and concerns addressed with instructions to call with any issues or inadequate analgesia.

## 2020-05-18 NOTE — Interval H&P Note (Signed)
History and Physical Interval Note:  05/18/2020 1:09 PM  Caitlyn Miller  has presented today for surgery, with the diagnosis of RIGHT BREAST INVASIVE DUCTAL CARCINOMA WITH AXILLARY METASTASES.  The various methods of treatment have been discussed with the patient and family. After consideration of risks, benefits and other options for treatment, the patient has consented to  Procedure(s): RIGHT MODIFIED RADICAL MASTECTOMY (Right) as a surgical intervention.  The patient's history has been reviewed, patient examined, no change in status, stable for surgery.  I have reviewed the patient's chart and labs.  Questions were answered to the patient's satisfaction.     Maia Petties

## 2020-05-18 NOTE — OR Nursing (Addendum)
Information found on right breast implant:  [employer] [phone] M+ 425cc   Explanted implant sent to pathology.

## 2020-05-18 NOTE — Anesthesia Preprocedure Evaluation (Addendum)
Anesthesia Evaluation  Patient identified by MRN, date of birth, ID band Patient awake    Reviewed: Allergy & Precautions, NPO status , Patient's Chart, lab work & pertinent test results  Airway Mallampati: I  TM Distance: >3 FB Neck ROM: Full    Dental  (+) Teeth Intact   Pulmonary neg pulmonary ROS,    Pulmonary exam normal        Cardiovascular negative cardio ROS   Rhythm:Regular Rate:Normal     Neuro/Psych  Headaches, Anxiety Depression    GI/Hepatic Neg liver ROS, GERD  Medicated,  Endo/Other  negative endocrine ROS  Renal/GU negative Renal ROS  negative genitourinary   Musculoskeletal Right breast cancer   Abdominal (+)  Abdomen: soft.    Peds  Hematology  (+) anemia ,   Anesthesia Other Findings   Reproductive/Obstetrics                            Anesthesia Physical Anesthesia Plan  ASA: II  Anesthesia Plan: General and Regional   Post-op Pain Management:  Regional for Post-op pain   Induction: Intravenous  PONV Risk Score and Plan: 3 and Ondansetron, Dexamethasone, Midazolam, Treatment may vary due to age or medical condition and Scopolamine patch - Pre-op  Airway Management Planned: Mask and LMA  Additional Equipment: None  Intra-op Plan:   Post-operative Plan: Extubation in OR  Informed Consent: I have reviewed the patients History and Physical, chart, labs and discussed the procedure including the risks, benefits and alternatives for the proposed anesthesia with the patient or authorized representative who has indicated his/her understanding and acceptance.     Dental advisory given  Plan Discussed with: CRNA  Anesthesia Plan Comments: (Lab Results      Component                Value               Date                      WBC                      7.7                 04/26/2020                HGB                      11.6 (L)            04/26/2020                 HCT                      34.4 (L)            04/26/2020                MCV                      95.0                04/26/2020                PLT                      295  04/26/2020           Lab Results      Component                Value               Date                      NA                       142                 04/26/2020                K                        4.1                 04/26/2020                CO2                      25                  04/26/2020                GLUCOSE                  95                  04/26/2020                BUN                      10                  04/26/2020                CREATININE               0.72                04/26/2020                CALCIUM                  9.6                 04/26/2020                GFRNONAA                 >60                 04/26/2020                GFRAA                    137                 05/01/2017          )       Anesthesia Quick Evaluation

## 2020-05-18 NOTE — Anesthesia Procedure Notes (Signed)
Procedure Name: LMA Insertion Date/Time: 05/18/2020 1:45 PM Performed by: Bufford Spikes, CRNA Pre-anesthesia Checklist: Patient identified, Emergency Drugs available, Suction available and Patient being monitored Patient Re-evaluated:Patient Re-evaluated prior to induction Oxygen Delivery Method: Circle system utilized Preoxygenation: Pre-oxygenation with 100% oxygen Induction Type: IV induction Ventilation: Mask ventilation without difficulty LMA: LMA inserted LMA Size: 3.0 Number of attempts: 1 Placement Confirmation: positive ETCO2 Tube secured with: Tape Dental Injury: Teeth and Oropharynx as per pre-operative assessment

## 2020-05-19 ENCOUNTER — Observation Stay (HOSPITAL_COMMUNITY): Payer: 59

## 2020-05-19 ENCOUNTER — Encounter (HOSPITAL_COMMUNITY): Payer: Self-pay | Admitting: Surgery

## 2020-05-19 DIAGNOSIS — C50911 Malignant neoplasm of unspecified site of right female breast: Secondary | ICD-10-CM | POA: Diagnosis not present

## 2020-05-19 LAB — CBC
HCT: 31.2 % — ABNORMAL LOW (ref 36.0–46.0)
Hemoglobin: 10 g/dL — ABNORMAL LOW (ref 12.0–15.0)
MCH: 31.7 pg (ref 26.0–34.0)
MCHC: 32.1 g/dL (ref 30.0–36.0)
MCV: 99 fL (ref 80.0–100.0)
Platelets: 262 10*3/uL (ref 150–400)
RBC: 3.15 MIL/uL — ABNORMAL LOW (ref 3.87–5.11)
RDW: 12.3 % (ref 11.5–15.5)
WBC: 8.9 10*3/uL (ref 4.0–10.5)
nRBC: 0 % (ref 0.0–0.2)

## 2020-05-19 LAB — BASIC METABOLIC PANEL
Anion gap: 9 (ref 5–15)
BUN: 9 mg/dL (ref 6–20)
CO2: 23 mmol/L (ref 22–32)
Calcium: 9.1 mg/dL (ref 8.9–10.3)
Chloride: 106 mmol/L (ref 98–111)
Creatinine, Ser: 0.66 mg/dL (ref 0.44–1.00)
GFR, Estimated: >60 mL/min (ref >60)
Glucose, Bld: 126 mg/dL — ABNORMAL HIGH (ref 70–99)
Potassium: 4.3 mmol/L (ref 3.5–5.1)
Sodium: 138 mmol/L (ref 135–145)

## 2020-05-19 IMAGING — DX DG CHEST 1V PORT
1 series · 1 of 1 positions shown · non-contrast
Comparison: [DATE]

CLINICAL DATA: Mild difficulty taking a deep breath.  Mastectomy

EXAM:
PORTABLE CHEST 1 VIEW

[chest]
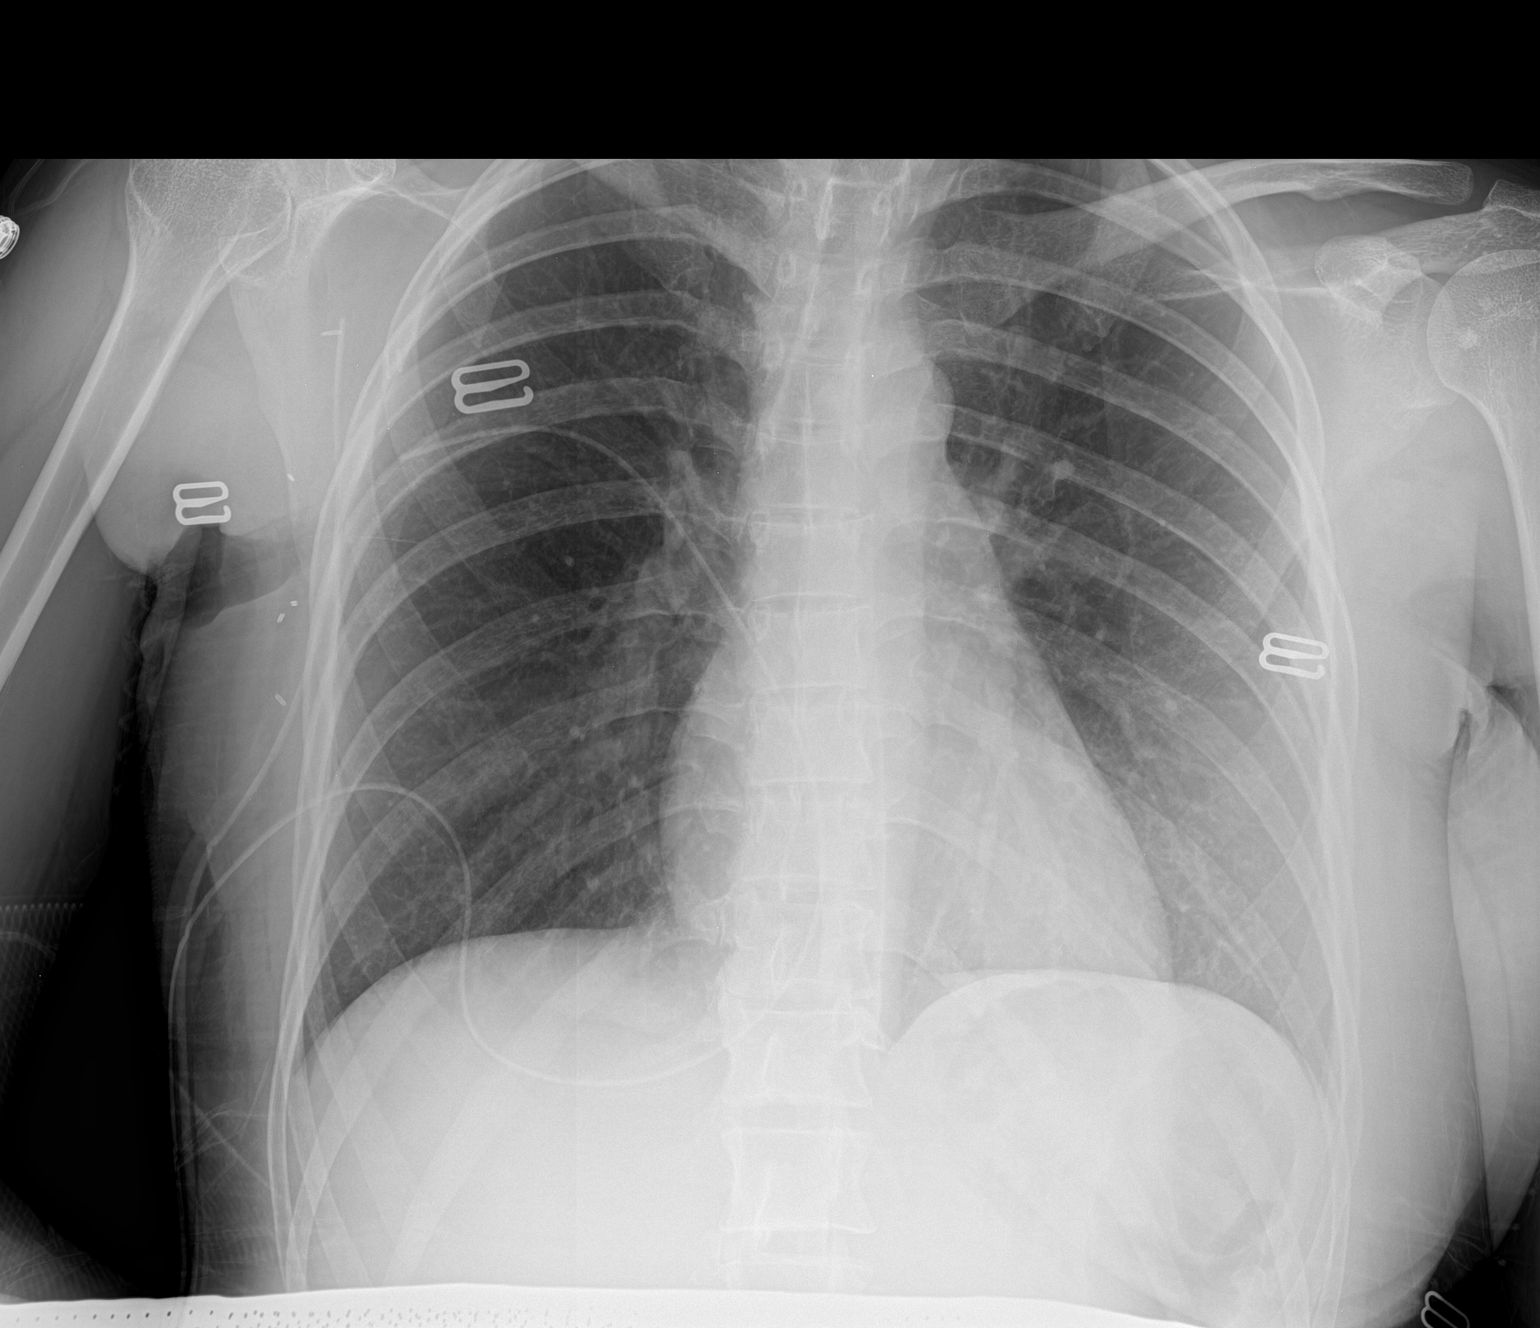

[1 of 1 positions shown; findings below may reference images not displayed]

FINDINGS: Postoperative chest wall with drains on the right. Asymmetric chest
density due to expander on the left. No visible effusion or
pneumothorax. Normal heart size and mediastinal contours.
IMPRESSION: No acute finding.

## 2020-05-19 MED ORDER — SODIUM CHLORIDE 0.9% FLUSH
3.0000 mL | Freq: Two times a day (BID) | INTRAVENOUS | Status: DC
  Administered 2020-05-19 (×2): 3 mL via INTRAVENOUS

## 2020-05-19 MED ORDER — SODIUM CHLORIDE 0.9 % IV SOLN: 250.0000 mL | INTRAVENOUS | Status: DC | PRN

## 2020-05-19 MED ORDER — SODIUM CHLORIDE 0.9% FLUSH
3.0000 mL | INTRAVENOUS | Status: DC | PRN
  Administered 2020-05-19 – 2020-05-20 (×2): 3 mL via INTRAVENOUS

## 2020-05-19 NOTE — Progress Notes (Signed)
1 Day Post-Op   Subjective/Chief Complaint: Pain moderately well-controlled She reports some mild difficulty taking a deep breath    Objective: Vital signs in last 24 hours: Temp:  [97.7 F (36.5 C)-98.1 F (36.7 C)] 98.1 F (36.7 C) (05/03 2117) Pulse Rate:  [54-100] 81 (05/03 2117) Resp:  [8-17] 15 (05/03 2117) BP: (94-123)/(55-80) 94/56 (05/03 2117) SpO2:  [96 %-100 %] 99 % (05/03 2117) Weight:  [54 kg] 54 kg (05/03 1053) Last BM Date: 05/17/20  Intake/Output from previous day: 05/03 0701 - 05/04 0700 In: 2769.9 [P.O.:300; I.V.:2370.1; IV Piggyback:99.9] Out: 260 [Drains:210; Blood:50] Intake/Output this shift: No intake/output data recorded.  General appearance: alert, cooperative and no distress Chest wall: right sided chest wall tenderness, Skin flaps viable with no sign of hematoma Incision c/d/i  Lab Results:  Recent Labs    05/18/20 1053 05/19/20 0225  WBC 6.0 8.9  HGB 12.0 10.0*  HCT 36.3 31.2*  PLT 287 262   BMET Recent Labs    05/19/20 0225  NA 138  K 4.3  CL 106  CO2 23  GLUCOSE 126*  BUN 9  CREATININE 0.66  CALCIUM 9.1   PT/INR No results for input(s): LABPROT, INR in the last 72 hours. ABG No results for input(s): PHART, HCO3 in the last 72 hours.  Invalid input(s): PCO2, PO2  Studies/Results: No results found.  Anti-infectives: Anti-infectives (From admission, onward)   Start     Dose/Rate Route Frequency Ordered Stop   05/18/20 2200  ceFAZolin (ANCEF) IVPB 2g/100 mL premix        2 g 200 mL/hr over 30 Minutes Intravenous Every 8 hours 05/18/20 1817 05/18/20 2238   05/18/20 1230  ceFAZolin (ANCEF) IVPB 2g/100 mL premix        2 g 200 mL/hr over 30 Minutes Intravenous On call to O.R. 05/18/20 1051 05/18/20 1347      Assessment/Plan: s/p Procedure(s): RIGHT MODIFIED RADICAL MASTECTOMY (Right) REMOVAL OF RIGHT BREAST IMPLANT (Right) Port CXR Encourage ambulation/ IS PRN pain meds Probable discharge 5/5  LOS: 0 days     Caitlyn Miller 05/19/2020

## 2020-05-19 NOTE — Plan of Care (Signed)
  Problem: Education: Goal: Knowledge of disease or condition will improve Outcome: Progressing   

## 2020-05-19 NOTE — Progress Notes (Signed)
pt had pain medicine (Morphine) 3 hrs ago, pt has low BP 85/46 now. This nurse encouraged her to drink more fluids, MD notified. No new orders and will give oral pain medicine instead next time.

## 2020-05-19 NOTE — Anesthesia Postprocedure Evaluation (Signed)
Anesthesia Post Note  Patient: Caitlyn Miller  Procedure(s) Performed: RIGHT MODIFIED RADICAL MASTECTOMY (Right Breast) REMOVAL OF RIGHT BREAST IMPLANT (Right Breast)     Patient location during evaluation: PACU Anesthesia Type: Regional and General Level of consciousness: awake and alert Pain management: pain level controlled Vital Signs Assessment: post-procedure vital signs reviewed and stable Respiratory status: spontaneous breathing, nonlabored ventilation, respiratory function stable and patient connected to nasal cannula oxygen Cardiovascular status: blood pressure returned to baseline and stable Postop Assessment: no apparent nausea or vomiting Anesthetic complications: no   No complications documented.  Last Vitals:  Vitals:   05/19/20 1314 05/19/20 1333  BP: (!) 85/46 (!) 89/46  Pulse: 70   Resp: 15   Temp: 37.1 C   SpO2: 99%     Last Pain:  Vitals:   05/19/20 1314  TempSrc: Oral  PainSc:                  Ashleigh Luckow,W. EDMOND

## 2020-05-19 NOTE — Plan of Care (Signed)

## 2020-05-19 NOTE — Progress Notes (Signed)
Drain care education given to patient ant husband.

## 2020-05-20 DIAGNOSIS — C50911 Malignant neoplasm of unspecified site of right female breast: Secondary | ICD-10-CM | POA: Diagnosis not present

## 2020-05-20 LAB — CBC
HCT: 28.2 % — ABNORMAL LOW (ref 36.0–46.0)
Hemoglobin: 9.1 g/dL — ABNORMAL LOW (ref 12.0–15.0)
MCH: 32.2 pg (ref 26.0–34.0)
MCHC: 32.3 g/dL (ref 30.0–36.0)
MCV: 99.6 fL (ref 80.0–100.0)
Platelets: 224 10*3/uL (ref 150–400)
RBC: 2.83 MIL/uL — ABNORMAL LOW (ref 3.87–5.11)
RDW: 12.5 % (ref 11.5–15.5)
WBC: 7.9 10*3/uL (ref 4.0–10.5)
nRBC: 0 % (ref 0.0–0.2)

## 2020-05-20 LAB — BASIC METABOLIC PANEL
Anion gap: 7 (ref 5–15)
BUN: 15 mg/dL (ref 6–20)
CO2: 27 mmol/L (ref 22–32)
Calcium: 8.3 mg/dL — ABNORMAL LOW (ref 8.9–10.3)
Chloride: 105 mmol/L (ref 98–111)
Creatinine, Ser: 0.72 mg/dL (ref 0.44–1.00)
GFR, Estimated: >60 mL/min (ref >60)
Glucose, Bld: 98 mg/dL (ref 70–99)
Potassium: 3.9 mmol/L (ref 3.5–5.1)
Sodium: 139 mmol/L (ref 135–145)

## 2020-05-20 MED ORDER — TRAMADOL HCL 50 MG PO TABS: 50.0000 mg | ORAL_TABLET | Freq: Four times a day (QID) | ORAL | 0 refills | Status: DC | PRN

## 2020-05-20 MED ORDER — ONDANSETRON 4 MG PO TBDP: 4.0000 mg | ORAL_TABLET | Freq: Three times a day (TID) | ORAL | 0 refills | Status: DC | PRN

## 2020-05-20 NOTE — Plan of Care (Signed)
  Problem: Health Behavior/Discharge Planning: Goal: Ability to manage health-related needs will improve Outcome: Completed/Met Blood pressure (!) 85/54, pulse (!) 52, temperature 97.7 F (36.5 C), resp. rate 16, height $RemoveBe'5\' 4"'cRraHYxOS$  (1.626 m), weight 54 kg, SpO2 100 %. A&O X4 pain is at 2/10, MD  Husband at bedside. Pt was reluctant to know how to take care of JP drain. Stated husband will be the one doing it. This RN watched husband do it and did it perfectly. This RN also notified Md of the blood pressure informedTramadol was given at 1030. MD  stated it is ok to discharge her. RN educated pt to hydrate. Educated on safety with activities especially after taking pain medication. Pt and husband verbalized of understanding.

## 2020-05-20 NOTE — Discharge Summary (Addendum)
Physician Discharge Summary  Patient ID: Caitlyn Miller MRN: [id number] DOB/AGE: [birth year] 35 y.o.  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  She did well but had some issues with pain control and mobilization.  On the morning of POD #2, she appears ready for discharge.  Pain is controlled with Tramadol.   Treatments: surgery: Right MRM/ removal implant  Discharge Exam: Blood pressure (!) 97/57, pulse (!) 54, temperature 97.8 F (36.6 C), temperature source Oral, resp. rate 16, height 5\' 4"  (1.626 m), weight 54 kg, SpO2 98 %. General appearance: alert, cooperative and no distress Chest wall: right sided chest wall tenderness incision c/d/i; drainage serosanguinous  Disposition: Discharge disposition: 01-Home or Self Care       Discharge Instructions    Call MD for:  persistant nausea and vomiting   Complete by: As directed    Call MD for:  redness, tenderness, or signs of infection (pain, swelling, redness, odor or green/yellow discharge around incision site)   Complete by: As directed    Call MD for:  severe uncontrolled pain   Complete by: As directed    Call MD for:  temperature >100.4   Complete by: As directed    Diet general   Complete by: As directed    Driving Restrictions   Complete by: As directed    Do not drive while taking pain medications   Increase activity slowly   Complete by: As directed    May shower / Bathe   Complete by: As directed      Allergies as of 05/20/2020      Reactions   Ibuprofen Nausea And Vomiting      Medication List    STOP taking these medications   doxycycline 100 MG tablet Commonly known as: VIBRA-TABS      TAKE these medications   abemaciclib 50 MG tablet Commonly known as: Verzenio Take 1 tablet (50 mg) in the morning and 2 tablets (100 mg) in the evening. Swallow tablets whole. Do not chew, crush, or split tablets before swallowing.   anastrozole 1 MG tablet Commonly known as: ARIMIDEX TAKE 1 TABLET BY MOUTH DAILY. What changed:   how much to take  when to take this   clindamycin 1 % gel Commonly known as: Clindagel Apply topically 2 (two) times daily.   gabapentin 100 MG capsule Commonly known as: Neurontin Take 2 capsules (200 mg total) by mouth at bedtime.   ondansetron 4 MG disintegrating tablet Commonly known as: Zofran ODT Take 1 tablet (4 mg total) by mouth every 8 (eight) hours as needed for nausea or vomiting. What changed:   medication strength  how much to take   pantoprazole 40 MG tablet Commonly known as: PROTONIX Take 1 tablet (40 mg total) by mouth daily as needed.   prochlorperazine 5 MG tablet Commonly known as: COMPAZINE Take 1-2 tablets (5-10 mg total) by mouth every 6 (six) hours as needed for nausea or vomiting.   sucralfate 1 g tablet Commonly known as: CARAFATE TAKE 1 TABLET(1 GRAM) BY MOUTH FOUR TIMES DAILY AT BEDTIME WITH MEALS What changed: See the new instructions.   traMADol 50 MG tablet Commonly known as: ULTRAM Take 1 tablet (50 mg total) by mouth every 6 (  six) hours as needed (mild pain).   venlafaxine 37.5 MG tablet Commonly known as: EFFEXOR TAKE 1 TABLET(37.5 MG) BY MOUTH TWICE DAILY What changed: See the new instructions.       Follow-up Information    Donnie Mesa, MD Follow up.   Specialty: General Surgery Why: 05/28/20 - keep appointment as scheduled Contact information: [address] [address] [phone]               Signed: Maia Petties 05/20/2020, 11:36 AM

## 2020-05-20 NOTE — Discharge Instructions (Signed)
[facility] °[phone] ° °MASTECTOMY: POST OP INSTRUCTIONS ° °Always review your discharge instruction sheet given to you by the facility where your surgery was performed. °IF YOU HAVE DISABILITY OR FAMILY LEAVE FORMS, YOU MUST BRING THEM TO THE OFFICE FOR PROCESSING.   °DO NOT GIVE THEM TO YOUR DOCTOR. °A prescription for pain medication may be given to you upon discharge.  Take your pain medication as prescribed, if needed.  If narcotic pain medicine is not needed, then you may take acetaminophen (Tylenol) or ibuprofen (Advil) as needed. °1. Take your usually prescribed medications unless otherwise directed. °2. If you need a refill on your pain medication, please contact your pharmacy.  They will contact our office to request authorization.  Prescriptions will not be filled after 5pm or on week-ends. °3. You should follow a light diet the first few days after arrival home, such as soup and crackers, etc.  Resume your normal diet the day after surgery. °4. Most patients will experience some swelling and bruising on the chest and underarm.  Ice packs will help.  Swelling and bruising can take several days to resolve.  °5. It is common to experience some constipation if taking pain medication after surgery.  Increasing fluid intake and taking a stool softener (such as Colace) will usually help or prevent this problem from occurring.  A mild laxative (Milk of Magnesia or Miralax) should be taken according to package instructions if there are no bowel movements after 48 hours. °6. Unless discharge instructions indicate otherwise, leave your bandage dry and in place until your next appointment in 3-5 days.  You may take a limited sponge bath.  No tube baths or showers until the drains are removed.  You may have steri-strips (small skin tapes) in place directly over the incision.  These strips should be left on the skin for 7-10 days.  If your surgeon used skin glue on the incision, you may  shower in 24 hours.  The glue will flake off over the next 2-3 weeks.  Any sutures or staples will be removed at the office during your follow-up visit. °7. DRAINS:  If you have drains in place, it is important to keep a list of the amount of drainage produced each day in your drains.  Before leaving the hospital, you should be instructed on drain care.  Call our office if you have any questions about your drains. °8. ACTIVITIES:  You may resume regular (light) daily activities beginning the next day--such as daily self-care, walking, climbing stairs--gradually increasing activities as tolerated.  You may have sexual intercourse when it is comfortable.  Refrain from any heavy lifting or straining until approved by your doctor. °a. You may drive when you are no longer taking prescription pain medication, you can comfortably wear a seatbelt, and you can safely maneuver your car and apply brakes. °b. RETURN TO WORK:  __________________________________________________________ °9. You should see your doctor in the office for a follow-up appointment approximately 3-5 days after your surgery.  Your doctor’s nurse will typically make your follow-up appointment when she calls you with your pathology report.  Expect your pathology report 2-3 business days after your surgery.  You may call to check if you do not hear from us after three days.   °10. OTHER INSTRUCTIONS: ______________________________________________________________________________________________ ____________________________________________________________________________________________ °WHEN TO CALL YOUR DOCTOR: °1. Fever over 101.0 °2. Nausea and/or vomiting °3. Extreme swelling or bruising °4. Continued bleeding from incision. °5. Increased pain, redness, or drainage from the incision. °  The clinic staff is available to answer your questions during regular business hours.  Please don’t hesitate to call and ask to speak to one of the nurses for clinical  concerns.  If you have a medical emergency, go to the nearest emergency room or call 911.  A surgeon from [facility] is always on call at the hospital. °[address], [address], [place], [place]  [address] ? [address] [address], [place], [place] [place]   [address] °[phone] ? [phone] ? FAX [phone] °Web site: [email or web address] °

## 2020-05-26 ENCOUNTER — Encounter: Payer: Self-pay | Admitting: *Deleted

## 2020-05-26 LAB — SURGICAL PATHOLOGY

## 2020-05-27 ENCOUNTER — Encounter: Payer: Self-pay | Admitting: Hematology

## 2020-05-27 ENCOUNTER — Inpatient Hospital Stay (HOSPITAL_BASED_OUTPATIENT_CLINIC_OR_DEPARTMENT_OTHER): Payer: 59 | Admitting: Hematology

## 2020-05-27 ENCOUNTER — Telehealth: Payer: Self-pay | Admitting: Hematology

## 2020-05-27 ENCOUNTER — Inpatient Hospital Stay: Payer: 59

## 2020-05-27 ENCOUNTER — Encounter: Payer: Self-pay | Admitting: *Deleted

## 2020-05-27 ENCOUNTER — Inpatient Hospital Stay: Payer: 59 | Attending: Hematology

## 2020-05-27 ENCOUNTER — Other Ambulatory Visit: Payer: Self-pay

## 2020-05-27 VITALS — BP 104/71 | HR 71 | Temp 97.7°F | Resp 20 | Wt 128.8 lb

## 2020-05-27 DIAGNOSIS — Z79811 Long term (current) use of aromatase inhibitors: Secondary | ICD-10-CM | POA: Diagnosis not present

## 2020-05-27 DIAGNOSIS — C50411 Malignant neoplasm of upper-outer quadrant of right female breast: Secondary | ICD-10-CM | POA: Diagnosis present

## 2020-05-27 DIAGNOSIS — Z5111 Encounter for antineoplastic chemotherapy: Secondary | ICD-10-CM | POA: Insufficient documentation

## 2020-05-27 DIAGNOSIS — F419 Anxiety disorder, unspecified: Secondary | ICD-10-CM | POA: Diagnosis not present

## 2020-05-27 DIAGNOSIS — F32A Depression, unspecified: Secondary | ICD-10-CM | POA: Insufficient documentation

## 2020-05-27 DIAGNOSIS — Z170 Estrogen receptor positive status [ER+]: Secondary | ICD-10-CM | POA: Insufficient documentation

## 2020-05-27 LAB — CBC WITH DIFFERENTIAL (CANCER CENTER ONLY)
Abs Immature Granulocytes: 0.03 10*3/uL (ref 0.00–0.07)
Basophils Absolute: 0 10*3/uL (ref 0.0–0.1)
Basophils Relative: 0 %
Eosinophils Absolute: 0.2 10*3/uL (ref 0.0–0.5)
Eosinophils Relative: 3 %
HCT: 36.6 % (ref 36.0–46.0)
Hemoglobin: 12 g/dL (ref 12.0–15.0)
Immature Granulocytes: 0 %
Lymphocytes Relative: 42 %
Lymphs Abs: 2.9 10*3/uL (ref 0.7–4.0)
MCH: 32.3 pg (ref 26.0–34.0)
MCHC: 32.8 g/dL (ref 30.0–36.0)
MCV: 98.4 fL (ref 80.0–100.0)
Monocytes Absolute: 0.6 10*3/uL (ref 0.1–1.0)
Monocytes Relative: 9 %
Neutro Abs: 3.1 10*3/uL (ref 1.7–7.7)
Neutrophils Relative %: 46 %
Platelet Count: 284 10*3/uL (ref 150–400)
RBC: 3.72 MIL/uL — ABNORMAL LOW (ref 3.87–5.11)
RDW: 12.1 % (ref 11.5–15.5)
WBC Count: 6.8 10*3/uL (ref 4.0–10.5)
nRBC: 0 % (ref 0.0–0.2)

## 2020-05-27 LAB — CMP (CANCER CENTER ONLY)
ALT: 136 U/L — ABNORMAL HIGH (ref 0–44)
AST: 49 U/L — ABNORMAL HIGH (ref 15–41)
Albumin: 4.2 g/dL (ref 3.5–5.0)
Alkaline Phosphatase: 97 U/L (ref 38–126)
Anion gap: 10 (ref 5–15)
BUN: 16 mg/dL (ref 6–20)
CO2: 28 mmol/L (ref 22–32)
Calcium: 9.8 mg/dL (ref 8.9–10.3)
Chloride: 104 mmol/L (ref 98–111)
Creatinine: 0.73 mg/dL (ref 0.44–1.00)
GFR, Estimated: >60 mL/min (ref >60)
Glucose, Bld: 92 mg/dL (ref 70–99)
Potassium: 4.4 mmol/L (ref 3.5–5.1)
Sodium: 142 mmol/L (ref 135–145)
Total Bilirubin: 0.4 mg/dL (ref 0.3–1.2)
Total Protein: 7.7 g/dL (ref 6.5–8.1)

## 2020-05-27 MED ORDER — GOSERELIN ACETATE 3.6 MG ~~LOC~~ IMPL
DRUG_IMPLANT | SUBCUTANEOUS | Status: AC
  Filled 2020-05-27: qty 3.6

## 2020-05-27 MED ORDER — GOSERELIN ACETATE 3.6 MG ~~LOC~~ IMPL
3.6000 mg | DRUG_IMPLANT | Freq: Once | SUBCUTANEOUS | Status: AC
  Administered 2020-05-27: 3.6 mg via SUBCUTANEOUS

## 2020-05-27 MED ORDER — PROCHLORPERAZINE MALEATE 10 MG PO TABS: 10.0000 mg | ORAL_TABLET | Freq: Four times a day (QID) | ORAL | 1 refills | Status: DC | PRN

## 2020-05-27 MED ORDER — ONDANSETRON HCL 8 MG PO TABS: 8.0000 mg | ORAL_TABLET | Freq: Two times a day (BID) | ORAL | 1 refills | Status: DC | PRN

## 2020-05-27 MED ORDER — LIDOCAINE-PRILOCAINE 2.5-2.5 % EX CREA: TOPICAL_CREAM | CUTANEOUS | 3 refills | Status: DC

## 2020-05-27 NOTE — Telephone Encounter (Signed)
Scheduled appts per 5/12 sch msg. Pt's husband is aware.

## 2020-05-27 NOTE — Progress Notes (Signed)
[facility]   Telephone:(336) [phone] Fax:(336) [phone]   Clinic Follow up Note   Patient Care Team: [name], Lilia Argue, MD as PCP - General (Family Medicine) Mansouraty, Telford Nab., MD as Consulting Physician (Gastroenterology) Mauro Kaufmann, RN as Oncology Nurse Navigator Rockwell Germany, RN as Oncology Nurse Navigator Donnie Mesa, MD as Consulting Physician (General Surgery) Truitt Merle, MD as Consulting Physician (Hematology) Kyung Rudd, MD as Consulting Physician (Radiation Oncology) Contogiannis, Audrea Muscat, MD as Consulting Physician (Plastic Surgery)  Date of Service:  05/27/2020  CHIEF COMPLAINT: f/u of right breast cancer  SUMMARY OF ONCOLOGIC HISTORY: Oncology History Overview Note  Cancer Staging Malignant neoplasm of upper-outer quadrant of right breast in female, estrogen receptor positive ([place]) Staging form: Breast, AJCC 8th Edition - Clinical stage from 11/12/2019: Stage IIA (cT2, cN1, cM0, G2, ER+, PR+, HER2-) - Signed by Truitt Merle, MD on 11/19/2019 Stage prefix: Initial diagnosis Histologic grading system: 3 grade system - Pathologic stage from 04/15/2020: No Stage Recommended (ypT2, pN2a, cM0, G2, ER+, PR+, HER2-) - Signed by Truitt Merle, MD on 05/27/2020 Stage prefix: Post-therapy Histologic grading system: 3 grade system Residual tumor (R): R1 - Microscopic    Malignant neoplasm of upper-outer quadrant of right breast in female, estrogen receptor positive ([place])  11/05/2019 Mammogram   IMPRESSION: Large irregular palpable mass 4.7 x 1.4 x 1.4 cm within the upper-outer right breast 11 o'clock position, 4 cm from nipple.  There is a large area of associated coarse heterogeneous calcifications within the mass. Overall findings are concerning for breast carcinoma.   Two cortically thickened right axillary lymph nodes which are indeterminate in etiology.   11/12/2019 Cancer Staging   Staging form: Breast, AJCC 8th Edition - Clinical  stage from 11/12/2019: Stage IIA (cT2, cN1, cM0, G2, ER+, PR+, HER2-) - Signed by Truitt Merle, MD on 11/19/2019   11/12/2019 Initial Biopsy   Diagnosis 1. Breast, right, needle core biopsy, right - INVASIVE MAMMARY CARCINOMA, SEE COMMENT. - MAMMARY CARCINOMA IN SITU. 2. Lymph node, needle/core biopsy, right - METASTATIC MAMMARY CARCINOMA. Microscopic Comment 1. The carcinoma appears grade 2 and measures 16 mm in greatest linear extent. E-cadherin will be ordered. Prognostic makers will be ordered. Dr. Saralyn Pilar has reviewed the case. The case was called to [facility] on 01/13/2020.    1. E-cadherin is strongly positive consistent with a ductal phenotype.   11/12/2019 Receptors her2   1. PROGNOSTIC INDICATORS Results: IMMUNOHISTOCHEMICAL AND MORPHOMETRIC ANALYSIS PERFORMED MANUALLY The tumor cells are NEGATIVE for Her2 (0). Estrogen Receptor: 100%, POSITIVE, STRONG STAINING INTENSITY Progesterone Receptor: 100%, POSITIVE, STRONG STAINING INTENSITY Proliferation Marker Ki67: 5%   11/17/2019 Initial Diagnosis   Malignant neoplasm of upper-outer quadrant of right breast in female, estrogen receptor positive ([place])   11/25/2019 Breast MRI   IMPRESSION: 1. Large area of non-mass enhancement involving the UPPER OUTER QUADRANT of the RIGHT breast measuring approximately 4.2 x 4.1 x 2.4 cm. The biopsy-proven IDC and DCIS is present at the superomedial aspect of this NME. 2. No MRI evidence of malignancy involving the LEFT breast. 3. Intact BILATERAL retropectoral implants. 4. 2 pathologically enlarged RIGHT axillary lymph nodes. One of these nodes is biopsy-proven metastatic disease. No pathologic lymphadenopathy elsewhere   11/25/2019 PET scan   IMPRESSION: 1. Two mildly enlarged hypermetabolic right axillary lymph nodes compatible with right axillary nodal metastases. 2. No additional sites of hypermetabolic metastatic disease. 3. Asymmetric indistinct upper right  breast hypermetabolism, without discrete mass correlate on  the CT images, compatible with known primary right breast malignancy.     11/26/2019 Genetic Testing   Negative genetic testing: no pathogenic variants detected in [facility] STAT Breast Cancer Panel.  Variant of uncertain significance (VUS) detected in CHEK2 at c.1556G>T (p.Arg519Leu).  The report date is November 26, 2019.   The STAT Breast cancer panel offered by [facility] includes sequencing and rearrangement analysis for the following 9 genes:  ATM, BRCA1, BRCA2, CDH1, CHEK2, PALB2, PTEN, STK11 and TP53.    Results of [facility] Multi-Cancer Panel are pending.    12/01/2019 -  Anti-estrogen oral therapy   Neoadjuvant Tamoxifen 29m on 12/01/19. Reduced to 190mon 12/09/19.       ---Zoladex injection monthly starting 12/09/19.       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  Variants of uncertain significance detected RAD51D at c.715C>T (p.Arg239Trp) and CHEK2 at c.1556G>T (p.Arg519Leu).  The report date is December 08, 2019.   The Multi-Cancer Panel offered by [facility] includes sequencing and/or deletion duplication testing of the following 85 genes: AIP, ALK, APC, ATM, AXIN2,BAP1,  BARD1, BLM, BMPR1A, BRCA1, BRCA2, BRIP1, CASR, CDC73, CDH1, CDK4, CDKN1B, CDKN1C, CDKN2A (p14ARF), CDKN2A (p16INK4a), CEBPA, CHEK2, CTNNA1, DICER1, DIS3L2, EGFR (c.2369C>T, p.Thr790Met variant only), EPCAM (Deletion/duplication testing only), FH, FLCN, GATA2, GPC3, GREM1 (Promoter region deletion/duplication testing only), HOXB13 (c.251G>A, p.Gly84Glu), HRAS, KIT, MAX, MEN1, MET, MITF (c.952G>A, p.Glu318Lys variant only), MLH1, MSH2, MSH3, MSH6, MUTYH, NBN, NF1, NF2, NTHL1, PALB2, PDGFRA, PHOX2B, PMS2, POLD1, POLE, POT1, PRKAR1A, PTCH1, PTEN, RAD50, RAD51C, RAD51D, RB1, RECQL4, RET, RNF43, RUNX1, SDHAF2,  SDHA (sequence changes only), SDHB, SDHC, SDHD, SMAD4, SMARCA4, SMARCB1, SMARCE1, STK11, SUFU, TERC, TERT, TMEM127, TP53, TSC1, TSC2, VHL, WRN and WT1.    12/09/2019 Miscellaneous   Mammaprint luminal type A, low risk  10% risk of recurrence in 10 years with 97.8% benefor of hormaonal therapy alone.    01/12/2020 - 03/25/2020 Chemotherapy   Verzenio started on 01/12/2020, dose reduced to 50106mm and 100m29m11/2022 due to poor tolerance. Held Verzenio on 03/25/20 to proceed with breast surgery.    03/01/2020 Mammogram   Targeted ultrasound is performed, showing interval decreasing conspicuity of the patient's known right breast cancer. A post biopsy clip with an associated irregular area shadowing is demonstrated at the 11 o'clock position 4 cm from the nipple. Exact measurements are difficult due to the vague appearance of the findings. It measures approximately 1.2 x 0.9 x 0.6 cm (previously 4.7 x 2.2 x 1.5 cm).   IMPRESSION: Imaging findings consistent with good response to chemotherapy.   03/16/2020 Imaging   MRI Breast  IMPRESSION: 1. Interval resolution of RIGHT axillary adenopathy. 2. Slightly smaller area of non mass enhancement in the UPPER-OUTER QUADRANT of the RIGHT breast.   04/15/2020 Surgery   RIGHT BREAST LUMPECTOMY WITH RADIOACTIVE SEED LOCALIZATION and RADIOACTIVE SEED GUIDED RIGHT AXILLARY SENTINEL LYMPH NODE DISSECTION and SENTINEL NODE BIOPSY by Dr TsueGeorgette Dover3/31/2022 Pathology Results   FINAL MICROSCOPIC DIAGNOSIS:   A. 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  - Invasive carcinoma broadly involves each the anterior, posterior,  superior and inferior margins.  - DCIS involves the inferior margin and is < 1 mm from each the anterior  and superior margins.  - Atypical lobular  hyperplasia.  - Biopsy sites.  - See oncology table.   B. TARGETED LYMPH NODE, RIGHT AXILLA, BIOPSY:  - Metastatic carcinoma in (1) of (1) lymph node.  - Biopsy site.   C. SENTINEL LYMPH NODE, RIGHT AXILLA #1, BIOPSY:  - Metastatic carcinoma in (1) of (1) lymph node.   D. SENTINEL LYMPH NODE, RIGHT AXILLA #2, BIOPSY:  - One lymph node, negative for carcinoma (0/1).    04/15/2020 Cancer Staging   Staging form: Breast, AJCC 8th Edition - Pathologic stage from 04/15/2020: No Stage Recommended (ypT2, pN2a, cM0, G2, ER+, PR+, HER2-) - Signed by Truitt Merle, MD on 05/27/2020 Stage prefix: Post-therapy Histologic grading system: 3 grade system Residual tumor (R): R1 - Microscopic   05/18/2020 Surgery   Right Mastectomy  A. BREAST, RIGHT, MASTECTOMY:  - Residual invasive carcinoma with mixed ductal and lobular features.       Residual invasive carcinoma broadly involves the anterior inferior  soft tissue margin.       Residual invasive carcinoma is focally < 1 mm from the deep margin.  - Residual ductal carcinoma in situ.       Residual DCIS is 3 mm from the closest margin, deep.  - Residual atypical lobular hyperplasia.  - Prior procedure site changes.  - Biopsy clip (X2).   B. BREAST IMPLANT, RIGHT, EXPLANTATION:  - Implant (gross only).   C. LYMPH NODES, RIGHT AXILLA, DISSECTION:  - Metastatic carcinoma in (2) of (12) lymph nodes.  - Prior procedure site changes in surrounding soft tissue.       CURRENT THERAPY:  Neoadjuvant Tamoxifen 16mdaily startedon 12/01/19. Reduced to 121mon 11/23/21due to poor tolerance.Zoladex injection monthly starting 12/09/19.Switched Tamoxifen to Anastrozole on 01/06/20.  INTERVAL HISTORY:  ImConchita Truxillos here for a follow up of breast cancer. She was last seen by me on 04/26/20. She presents to the clinic alone. She tried to contact her husband to include him via speaker phone, but he did not answer due to work. She reports change in her  taste that started right after surgery. She denies any other Covid symptoms. She wonders if it is related to the anesthesia. She notes numbness to her right side and arm. She cannot raise her arm very far despite exercise and reports pain. She will see Dr. TsGeorgette Doveromorrow, 05/28/20.  All other systems were reviewed with the patient and are negative.  MEDICAL HISTORY:  Past Medical History:  Diagnosis Date  . Abnormal Pap smear   . Acid reflux   . Anemia   . Anxiety   . [place]  . Decreased appetite 10/08  . Depression   . Endometriosis 10/2004  . Epigastric pain 10/2006  . Family history of breast cancer 11/19/2019  . FH: migraines   . GBS carrier   . H/O amenorrhea 06/2006  . H/O dyspareunia 7/07  . H/O fatigue   . H/O nausea and vomiting 10/2006  . H/O rubella   . H/O varicella   . Hyperemesis arising during pregnancy    First pregnancy  . Irregular periods/menstrual cycles 02/2005  . 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 Pelvic pain 09/2008    SURGICAL HISTORY: Past Surgical History:  Procedure Laterality Date  . AUGMENTATION MAMMAPLASTY Bilateral 207124 silicone   . BREAST IMPLANT REMOVAL Right 05/18/2020   Procedure: REMOVAL OF RIGHT BREAST IMPLANT;  Surgeon: TsDonnie MesaMD;  [facility] Service:  General;  Laterality: Right;  . BREAST LUMPECTOMY WITH RADIOACTIVE SEED LOCALIZATION Right 04/15/2020   Procedure: RIGHT BREAST LUMPECTOMY WITH RADIOACTIVE SEED LOCALIZATION;  Surgeon: Donnie Mesa, MD;  Location: [facility];  Service: General;  Laterality: Right;  . MODIFIED MASTECTOMY Right 05/18/2020   Procedure: RIGHT MODIFIED RADICAL MASTECTOMY;  Surgeon: Donnie Mesa, MD;  Location: [facility];  Service: General;  Laterality: Right;  . RADIOACTIVE SEED GUIDED AXILLARY SENTINEL LYMPH NODE Right 04/15/2020   Procedure: RADIOACTIVE SEED GUIDED RIGHT AXILLARY SENTINEL LYMPH NODE DISSECTION;  Surgeon: Donnie Mesa, MD;  Location: [facility];  Service: General;  Laterality: Right;  . SENTINEL NODE BIOPSY N/A 04/15/2020   Procedure: SENTINEL NODE BIOPSY;  Surgeon: Donnie Mesa, MD;  Location: [facility];  Service: General;  Laterality: N/A;  . WISDOM TOOTH EXTRACTION      I have reviewed the social history and family history with the patient and they are unchanged from previous note.  ALLERGIES:  is allergic to ibuprofen.  MEDICATIONS:  Current Outpatient Medications  Medication Sig Dispense Refill  . abemaciclib (VERZENIO) 50 MG tablet Take 1 tablet (50 mg) in the morning and 2 tablets (100 mg) in the evening. Swallow tablets whole. Do not chew, crush, or split tablets before swallowing. (Patient not taking: No sig reported) 90 tablet 1  . anastrozole (ARIMIDEX) 1 MG tablet TAKE 1 TABLET BY MOUTH DAILY. (Patient taking differently: Take 1 mg by mouth at bedtime.) 30 tablet 3  . clindamycin (CLINDAGEL) 1 % gel Apply topically 2 (two) times daily. (Patient not taking: No sig reported) 30 g 0  . gabapentin (NEURONTIN) 100 MG capsule Take 2 capsules (200 mg total) by mouth at bedtime. 60 capsule 1  . ondansetron (ZOFRAN ODT) 4 MG disintegrating tablet Take 1 tablet (4 mg total) by mouth every 8 (eight) hours as needed for nausea or vomiting. 20 tablet 0  . pantoprazole (PROTONIX) 40 MG tablet Take 1 tablet (40 mg total) by mouth daily as needed. (Patient not taking: Reported on 05/10/2020) 30 tablet 5  . prochlorperazine (COMPAZINE) 5 MG tablet Take 1-2 tablets (5-10 mg total) by mouth every 6 (six) hours as needed for nausea or vomiting. (Patient not taking: Reported on 05/10/2020) 30 tablet 1  . sucralfate (CARAFATE) 1 g tablet TAKE 1 TABLET(1 GRAM) BY MOUTH FOUR TIMES DAILY AT BEDTIME WITH MEALS (Patient taking differently: Take 1 g by mouth daily as needed (Upset stomach).) 60 tablet 1  . traMADol (ULTRAM) 50 MG tablet Take 1 tablet (50 mg total) by mouth every 6 (six) hours as needed (mild pain).  30 tablet 0  . venlafaxine (EFFEXOR) 37.5 MG tablet TAKE 1 TABLET(37.5 MG) BY MOUTH TWICE DAILY (Patient taking differently: Take 37.5 mg by mouth daily.) 30 tablet 4   No current facility-administered medications for this visit.   Facility-Administered Medications Ordered in Other Visits  Medication Dose Route Frequency Provider Last Rate Last Admin  . ondansetron (ZOFRAN) tablet 8 mg  8 mg Oral Once Truitt Merle, MD        PHYSICAL EXAMINATION: ECOG PERFORMANCE STATUS: 1 - Symptomatic but completely ambulatory  Vitals:   05/27/20 1024  BP: 104/71  Pulse: 71  Resp: 20  Temp: 97.7 F (36.5 C)  SpO2: 100%   Filed Weights   05/27/20 1024  Weight: 128 lb 12.8 oz (58.4 kg)    GENERAL:alert, no distress and comfortable SKIN: skin color, texture, turgor are normal, no rashes or significant lesions  EYES: normal, Conjunctiva are pink and non-injected, sclera clear  NECK: supple, thyroid normal size, non-tender, without nodularity LYMPH:  no palpable lymphadenopathy in the cervical, axillary LUNGS: normal breathing effort Musculoskeletal:no cyanosis of digits and no clubbing  NEURO: alert & oriented x 3 with fluent speech, no focal motor/sensory deficits BREASTS: swelling to right chest wall and right armpit.  LABORATORY DATA:  I have reviewed the data as listed CBC Latest Ref Rng & Units 05/27/2020 05/20/2020 05/19/2020  WBC 4.0 - 10.5 K/uL 6.8 7.9 8.9  Hemoglobin 12.0 - 15.0 g/dL 12.0 9.1(L) 10.0(L)  Hematocrit 36.0 - 46.0 % 36.6 28.2(L) 31.2(L)  Platelets 150 - 400 K/uL 284 224 262     CMP Latest Ref Rng & Units 05/27/2020 05/20/2020 05/19/2020  Glucose 70 - 99 mg/dL 92 98 126(H)  BUN 6 - 20 mg/dL 16 15 9   Creatinine 0.44 - 1.00 mg/dL 0.73 0.72 0.66  Sodium 135 - 145 mmol/L 142 139 138  Potassium 3.5 - 5.1 mmol/L 4.4 3.9 4.3  Chloride 98 - 111 mmol/L 104 105 106  CO2 22 - 32 mmol/L 28 27 23   Calcium 8.9 - 10.3 mg/dL 9.8 8.3(L) 9.1  Total Protein 6.5 - 8.1 g/dL 7.7 - -  Total  Bilirubin 0.3 - 1.2 mg/dL 0.4 - -  Alkaline Phos 38 - 126 U/L 97 - -  AST 15 - 41 U/L 49(H) - -  ALT 0 - 44 U/L 136(H) - -      RADIOGRAPHIC STUDIES: I have personally reviewed the radiological images as listed and agreed with the findings in the report. No results found.   ASSESSMENT & PLAN:  Caitlyn Miller is a 35 y.o. female with   1.Malignant neoplasm of upper-outer quadrant of right breast, StageIIA, p(T2N1aM0), ER+/PR+/HER2-, Levester Fresh, Mammaprintluminal type A, low risk -She has a 4.7cm right breast mass at 11:00 position andtwo abnormalright axillary LN. Her10/27/21biopsy shows grade II invasive ductal carcinomawithmetastasisto her LN, ER/PRstrongly positive, HER2 negative, low Ki67.  -Her 11/2019 breast MRI and PET scan did not indicate left breast or distant malignancy or metastasis. -Due to her large tumor, she needsmastectomy if she proceeded with surgery first. She has been seen by Dr. Georgette Dover.Her mastectomy will be complicated by history of implant placement. -Mammaprint showed low risk disease,shewouldunlikely benefit from chemotherapy, and neoadjuvant chemotherapy is not recommended. -I have started her on neoadjuvant tamoxifen on 12/01/19. She tolerated poorly with nausea, and diffuse body aches. Dose reduced to 47m from 12/09/19.I started her on monthly Zoladex injection on 12/09/19. Then switched Tamoxifen to Anastrozole on 01/06/20. -I started her on Verzenio on 01/12/2020, dose reduced to 528mam and 10082m/11/2020 due to poor tolerance. Held Verzenio on 03/25/20 to proceed with breast surgery.  -Given her 03/01/20 [place] mammogram indicated good response to treatment, she proceeded with right lumpectomy and sentinel LN dissection on 04/15/20 with Dr TsuGeorgette Doverurgical pathology showed larger than expected tumor at 3.1cm of invasive carcinoma with mixed ductal and lobular features and DCIS components. She also had positive margins and 2/3 positive LNs.  -She  was recommended to proceed with mastectomy and axillary node dissection due to her young age and positive nodes after neoadjuvant treatment. -This was performed on 05/18/20 under Dr. TsuGeorgette Doverowing: residual invasive carcinoma and DCIS. She again had a positive margin and another 2/12 positive nodes. She had stage III disease. I discussed the results extensively with her today, including high risk of recurrence in the future. -Due to her N2 disease, I  recommend adjuvant chemotherapy to reduce her risk of recurrence. -I recommend ACT (every 2 weeks x4) followed by taxol (weekly x12). The other option would be docetaxel and Cytoxan (every 3 weeks x6). I discussed the side effects of each with her in detail.  --Chemotherapy consent: Side effects including but does not not limited to, fatigue, nausea, vomiting, diarrhea, hair loss, neuropathy, fluid retention, renal and kidney dysfunction, neutropenic fever, needed for blood transfusion, bleeding, small risk of bleeding, MDS/AML, were discussed with patient in great detail. She agrees to proceed. -Given positive LNs, she will proceed with radiation after mastectomy to reduce her risk of local recurrence. -She will continue Zoladex injection. She does not plan to have more children.  We discussed chemo-induced infertility.  2.GeneralizedWeakness, body pain, Fatigue  -Pt notes for the past 3 years she has had mid chest and mid back pain. Previously intermittent, now constant. She also notes b/l arm and leg weakness and pain along with tingling of her toes and fingers.  -This has lead to fatigue, low appetite, decreased daily function/activity.  -Pt notes prior workup for this in [place] was negative with Neurologist and cardiologist. They said this is related to stress, anxiety or depression. Pt feels she has an illness causing this instead.  -Her 11/25/19 PET was negative for any distant malignancy or metastasis.  3. Anxiety, depression  -Pt notes feeling  more anxious (due to health before breast cancer), than depressed.  -Shewason Lexapro,I switched her toEffexor, she is tolerating better  4. Genetic Testingnegative for pathogenetic mutationsin RAD51D, andVariant of uncertain significance (VUS) detected inCHEK2at c.1556G>T (p.Arg519Leu). Nunzio Cory discussed with our genetic consoler  5. Social Support  -She is from [place] and lives both there and in [place] with her husband. She has 2 teenage children. The rest of her family is in [place].   PLAN: -ContinueZoladex injection every 4 weeks, will proceed with today.  -F/u with Dr. Georgette Dover tomorrow as scheduled -f/u and chemo class in 2-3 weeks -echo before next visit  -plan to start first cycle chemo AC with GCSF in about 4 weeks     No problem-specific Assessment & Plan notes found for this encounter.   No orders of the defined types were placed in this encounter.  All questions were answered. The patient knows to call the clinic with any problems, questions or concerns. No barriers to learning was detected. The total time spent in the appointment was 40 minutes.     Truitt Merle, MD 05/27/2020   I, Wilburn Mylar, am acting as scribe for Truitt Merle, MD.   I have reviewed the above documentation for accuracy and completeness, and I agree with the above.

## 2020-05-27 NOTE — Progress Notes (Signed)
START ON PATHWAY REGIMEN - Breast     Cycles 1 through 4: A cycle is every 14 days:     Doxorubicin      Cyclophosphamide      Pegfilgrastim-xxxx    Cycles 5 through 16: A cycle is every 7 days:     Paclitaxel   **Always confirm dose/schedule in your pharmacy ordering system**  Patient Characteristics: Postoperative without Neoadjuvant Therapy (Pathologic Staging), Invasive Disease, Adjuvant Therapy, HER2 Negative/Unknown/Equivocal, ER Positive, Node Positive, Node Positive (4+) Therapeutic Status: Postoperative without Neoadjuvant Therapy (Pathologic Staging) AJCC Grade: G2 AJCC N Category: pN2a AJCC M Category: cM0 ER Status: Positive (+) AJCC 8 Stage Grouping: IB HER2 Status: Negative (-) Oncotype Dx Recurrence Score: Not Appropriate AJCC T Category: pT2 PR Status: Positive (+) Adjuvant Therapy Status: No Adjuvant Therapy Received Yet or Changing Initial Adjuvant Regimen due to Tolerance Intent of Therapy: Curative Intent, Discussed with Patient

## 2020-05-28 ENCOUNTER — Encounter: Payer: Self-pay | Admitting: *Deleted

## 2020-05-28 ENCOUNTER — Telehealth: Payer: Self-pay | Admitting: Hematology

## 2020-05-28 ENCOUNTER — Ambulatory Visit: Payer: Self-pay | Admitting: Surgery

## 2020-05-28 NOTE — Telephone Encounter (Signed)
Scheduled follow-up appointments per 5/12 los. Patient is aware. 

## 2020-05-28 NOTE — H&P (View-Only) (Signed)
History of Present Illness [place]lyn [place]. Greer Koeppen MD; 05/28/2020 2:36 PM) The patient is a 35 year old female who presents with breast cancer. Breast cancer MDC - 11/19/19 [place] PCP - Dr. [name]  This is a 35 year old [name] female who presents about one year s/p bilateral retropectoral breast implants by Dr. Nathanial Rancher. The patient states that her last mammogram was several years ago. Over the last six months, she has noticed an enlarging palpable abnormality in the right breast - upper outer quadrant. She underwent workup which showed a 3.8 cm area of calcifications at 1100 4 cmfn. [place] measured this at 4.4 x 1.4 x. 1.4 cm with two thickened lymph nodes in the right axilla. 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 Her lymph node was positive, and the tumor was ER/PR positive, HER-2 was negative with a Ki-67 of 5%.  She also reports pain in the mid chest and mid back pain, (onset 3 years ago) which was previously intermittent and now constant in the past 3 months. She also notes pain and weakness in her b/l arms and legs. Her pain is 7-8/10. She had work up with MRI in [place] in summer 2021 which was reportedly negative. She notes the weakness leads to decreased function and activity. Her weight is mostly stable, but she is eating less. She describes frequent pain in her epigastrium that radiates through to her back, usually after eating or swallowing medication. She describes nausea, heartburn, bloating. She states that she had an ultrasound in [place] that showed no gallstones. She is on Protonix.  MRI and PET scan showed no left breast or distal metastases. Mammogram showed low risk disease saw chemotherapy was not given. She started on neoadjuvant tamoxifen but tolerated this poorly. She was switched to anastrozole and is also receiving Zoladex injections  monthly. She is also on reduced dose of Verzenio as she was not able to tolerate the full dose. Ultrasound performed on 03/01/20 showed that the right breast mass now measures 1.2 x 0.9 x 0.6 cm. The axilla shows only normal sized lymph nodes. MRI performed last week showed interval resolution of the right axillary adenopathy. The right upper outer quadrant shows persistent non-mass enhancement spanning 4.6 x 4.5 x 2.0 cm.  I had an extended discussion with the patient and her husband. Clinically, the palpable mass has essentially resolved. I cannot really palpate a mass in the right upper outer quadrant. The clinically positive lymph nodes are now low no longer palpable. There is some concern with the persistent large area of non-mass enhancement in the right upper outer quadrant. At this time, it is difficult to determine the cause of this non-mass enhancement. This may be residual changes post-neoadjuvant treatment.  I recommended proceeding with a right radioactive seed localized lumpectomy. We will also perform a targeted axillary lymph node dissection to remove the previously biopsied lymph nodes as well as a sentinel lymph node biopsy using blue dye and radioactive tracer. They understand that if the margins of the lumpectomy comeback widely positive that she might need a larger surgery with possible mastectomy. On 04/15/20, she underwent bracketed right radioactive seed localized lumpectomy as well as targeted lymph node dissection and sentinel lymph node biopsy. Pathology showed a 3.1 cm grade 2 invasive carcinoma with mixed ductal and lobular features with associated DCIS. Invasive cancer broadly involves anterior, posterior, superior, inferior margins. DCIS  involved inferior margin. 3 lymph nodes were examined. 2 showed metastatic carcinoma.  On 05/18/20, she underwent right modified radical mastectomy with removal of her right retropectoral implant. The axilla showed 12 additional  lymph nodes, two containing metastatic carcinoma. Her mastectomy showed a close deep margin (less than 1 mm), but the specimen included the pectoralis fascia. The anterior lower margin is broadly positive, but this is a very thin mastectomy skin flap.  She has had a consultation with Dr. Burr Medico yesterday and they have plans to begin chemotherapy within the next few weeks, followed by radiation. The patient is having a lot of stiffness in her right shoulder and elbow, but no swelling in her hand or arm.The drain output is serous and minimal.   Problem List/Past Medical Rodman Key K. 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 Prithvi Kooi, MD; 05/28/2020 2:40 PM) Breast Biopsy Bilateral.  Diagnostic Studies History Rodman Key K. Kayceon Oki, MD; 05/28/2020 2:40 PM) Colonoscopy 5-10 years ago Mammogram within last year Pap Smear 1-5 years ago  Allergies Jess Barters, CMA; 05/28/2020 9:08 AM) Ibuprofen *ANALGESICS - ANTI-INFLAMMATORY* Nausea, Vomiting. Allergies Reconciled  Medication History Jess Barters, CMA; 05/28/2020 9:08 AM) Venlafaxine HCl (37.5MG Tablet, Oral) Active. Gabapentin (100MG Capsule, Oral) Active. Arimidex (1MG Tablet, Oral) Active. Lexapro (10MG Tablet, Oral) Active. Protonix (40MG Tablet DR, Oral) Active. Medications Reconciled  Social History Caitlyn Miller. Oluwatoni Rotunno, MD; 05/28/2020 2:40 PM) Caffeine use Coffee, Tea. No alcohol use No drug use Tobacco use Never smoker.  Family History Caitlyn Miller. Lou Irigoyen, MD; 05/28/2020 2:40 PM) Hypertension Father. Migraine Headache Mother.  Pregnancy / Birth History Caitlyn Miller. Maritsa Hunsucker, MD; 05/28/2020 2:40 PM) Age at menarche 88 years, 14 years. Contraceptive History Intrauterine device. Gravida 2 Length (months) of breastfeeding 12-24 Maternal age 73-20 Para 2  Other Problems Caitlyn Miller. Cletus Paris, MD; 05/28/2020 2:40 PM) Breast Cancer Back  Pain Lump In Breast Chest pain Depression Gastroesophageal Reflux Disease     Physical Exam Rodman Key K. Kiani Wurtzel MD; 05/28/2020 2:37 PM)  The physical exam findings are as follows: Note:WDWN in NAD Right chest - skin flaps viable; no visible seroma. The drains are removed without difficulty. Stiffness/ tightness with abduction of the right shoulder and extension of the right elbow.    Assessment & Plan Caitlyn Miller. 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 The surgical procedure has been discussed with the patient. Potential risks, benefits, alternative treatments, and expected outcomes have been explained. All of the patient's questions at this time have been answered. The likelihood of reaching the patient's treatment goal is good. The patient understand the proposed surgical procedure and wishes to proceed. Note:Hopefully her ROM will increase now that the drains are removed. She will likely to work with physical therapy to regain full ROM of the right upper extremity. No initial signs of lymphedema.  Port placement in next 2-3 weeks to begin chemo  We discussed the lower flap and the positive margin. There is not really anything to excise except skin, which would be impossible to close. We will recommend radiation to include this lower flap and we will closely monitor for any sign of recurrence.  Caitlyn Miller. Georgette Dover, MD, [facility] Surgery  General/ Trauma Surgery   05/28/2020 2:42 PM

## 2020-05-28 NOTE — H&P (Signed)
History of Present Illness Caitlyn Miller. Caitlyn Mkrtchyan MD; 05/28/2020 2:36 PM) The patient is a 35 year old female who presents with breast cancer. Breast cancer MDC - 11/19/19 Caitlyn Miller PCP - Caitlyn Miller  This is a 35 year old [name] female who presents about one year s/p bilateral retropectoral breast implants by Caitlyn Miller. The patient states that her last mammogram was several years ago. Over the last six months, she has noticed an enlarging palpable abnormality in the right breast - upper outer quadrant. She underwent workup which showed a 3.8 cm area of calcifications at 1100 4 cmfn. [place] measured this at 4.4 x 1.4 x. 1.4 cm with two thickened lymph nodes in the right axilla. 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 Her lymph node was positive, and the tumor was ER/PR positive, HER-2 was negative with a Ki-67 of 5%.  She also reports pain in the mid chest and mid back pain, (onset 3 years ago) which was previously intermittent and now constant in the past 3 months. She also notes pain and weakness in her b/l arms and legs. Her pain is 7-8/10. She had work up with MRI in [place] in summer 2021 which was reportedly negative. She notes the weakness leads to decreased function and activity. Her weight is mostly stable, but she is eating less. She describes frequent pain in her epigastrium that radiates through to her back, usually after eating or swallowing medication. She describes nausea, heartburn, bloating. She states that she had an ultrasound in [place] that showed no gallstones. She is on Protonix.  MRI and PET scan showed no left breast or distal metastases. Mammogram showed low risk disease saw chemotherapy was not given. She started on neoadjuvant tamoxifen but tolerated this poorly. She was switched to anastrozole and is also receiving Zoladex injections  monthly. She is also on reduced dose of Verzenio as she was not able to tolerate the full dose. Ultrasound performed on 03/01/20 showed that the right breast mass now measures 1.2 x 0.9 x 0.6 cm. The axilla shows only normal sized lymph nodes. MRI performed last week showed interval resolution of the right axillary adenopathy. The right upper outer quadrant shows persistent non-mass enhancement spanning 4.6 x 4.5 x 2.0 cm.  I had an extended discussion with the patient and her husband. Clinically, the palpable mass has essentially resolved. I cannot really palpate a mass in the right upper outer quadrant. The clinically positive lymph nodes are now low no longer palpable. There is some concern with the persistent large area of non-mass enhancement in the right upper outer quadrant. At this time, it is difficult to determine the cause of this non-mass enhancement. This may be residual changes post-neoadjuvant treatment.  I recommended proceeding with a right radioactive seed localized lumpectomy. We will also perform a targeted axillary lymph node dissection to remove the previously biopsied lymph nodes as well as a sentinel lymph node biopsy using blue dye and radioactive tracer. They understand that if the margins of the lumpectomy comeback widely positive that she might need a larger surgery with possible mastectomy. On 04/15/20, she underwent bracketed right radioactive seed localized lumpectomy as well as targeted lymph node dissection and sentinel lymph node biopsy. Pathology showed a 3.1 cm grade 2 invasive carcinoma with mixed ductal and lobular features with associated DCIS. Invasive cancer broadly involves anterior, posterior, superior, inferior margins. DCIS  involved inferior margin. 3 lymph nodes were examined. 2 showed metastatic carcinoma.  On 05/18/20, she underwent right modified radical mastectomy with removal of her right retropectoral implant. The axilla showed 12 additional  lymph nodes, two containing metastatic carcinoma. Her mastectomy showed a close deep margin (less than 1 mm), but the specimen included the pectoralis fascia. The anterior lower margin is broadly positive, but this is a very thin mastectomy skin flap.  She has had a consultation with Caitlyn Miller yesterday and they have plans to begin chemotherapy within the next few weeks, followed by radiation. The patient is having a lot of stiffness in her right shoulder and elbow, but no swelling in her hand or arm.The drain output is serous and minimal.   Problem List/Past Medical Caitlyn Key K. 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 Reyah Streeter, MD; 05/28/2020 2:40 PM) Breast Biopsy Bilateral.  Diagnostic Studies History Caitlyn Key K. Kaitlin Alcindor, MD; 05/28/2020 2:40 PM) Colonoscopy 5-10 years ago Mammogram within last year Pap Smear 1-5 years ago  Allergies Caitlyn Miller, CMA; 05/28/2020 9:08 AM) Ibuprofen *ANALGESICS - ANTI-INFLAMMATORY* Nausea, Vomiting. Allergies Reconciled  Medication History Caitlyn Miller, CMA; 05/28/2020 9:08 AM) Venlafaxine HCl (37.5MG Tablet, Oral) Active. Gabapentin (100MG Capsule, Oral) Active. Arimidex (1MG Tablet, Oral) Active. Lexapro (10MG Tablet, Oral) Active. Protonix (40MG Tablet DR, Oral) Active. Medications Reconciled  Social History Caitlyn Miller. Caitlyn Hafen, MD; 05/28/2020 2:40 PM) Caffeine use Coffee, Tea. No alcohol use No drug use Tobacco use Never smoker.  Family History Caitlyn Miller. Caitlyn Haverstick, MD; 05/28/2020 2:40 PM) Hypertension Father. Migraine Headache Mother.  Pregnancy / Birth History Caitlyn Miller. Caitlyn Burda, MD; 05/28/2020 2:40 PM) Age at menarche 88 years, 14 years. Contraceptive History Intrauterine device. Gravida 2 Length (months) of breastfeeding 12-24 Maternal age 73-20 Para 2  Other Problems Caitlyn Miller. Caitlyn Carrozza, MD; 05/28/2020 2:40 PM) Breast Cancer Back  Pain Lump In Breast Chest pain Depression Gastroesophageal Reflux Disease     Physical Exam Caitlyn Key K. Keval Nam MD; 05/28/2020 2:37 PM)  The physical exam findings are as follows: Note:WDWN in NAD Right chest - skin flaps viable; no visible seroma. The drains are removed without difficulty. Stiffness/ tightness with abduction of the right shoulder and extension of the right elbow.    Assessment & Plan Caitlyn Miller. 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 The surgical procedure has been discussed with the patient. Potential risks, benefits, alternative treatments, and expected outcomes have been explained. All of the patient's questions at this time have been answered. The likelihood of reaching the patient's treatment goal is good. The patient understand the proposed surgical procedure and wishes to proceed. Note:Hopefully her ROM will increase now that the drains are removed. She will likely to work with physical therapy to regain full ROM of the right upper extremity. No initial signs of lymphedema.  Port placement in next 2-3 weeks to begin chemo  We discussed the lower flap and the positive margin. There is not really anything to excise except skin, which would be impossible to close. We will recommend radiation to include this lower flap and we will closely monitor for any sign of recurrence.  Caitlyn Miller. Georgette Dover, MD, [facility] Surgery  General/ Trauma Surgery   05/28/2020 2:42 PM

## 2020-05-31 ENCOUNTER — Telehealth: Payer: Self-pay

## 2020-05-31 NOTE — Telephone Encounter (Signed)
Caitlyn Miller, Thomasa's husband left message stating that she has swollen arms.  He also states they have a couple of questions.  I returned the call no answer I left a vm.

## 2020-06-01 ENCOUNTER — Other Ambulatory Visit: Payer: Self-pay | Admitting: Hematology

## 2020-06-01 ENCOUNTER — Encounter: Payer: Self-pay | Admitting: *Deleted

## 2020-06-01 ENCOUNTER — Telehealth: Payer: Self-pay | Admitting: Hematology

## 2020-06-01 NOTE — Telephone Encounter (Signed)
Scheduled appt per 5/17 sch msg. Called pt, no answer. Left msg with appt date and time.  

## 2020-06-04 ENCOUNTER — Encounter (HOSPITAL_BASED_OUTPATIENT_CLINIC_OR_DEPARTMENT_OTHER): Payer: Self-pay | Admitting: Surgery

## 2020-06-04 ENCOUNTER — Telehealth: Payer: Self-pay | Admitting: Hematology

## 2020-06-04 ENCOUNTER — Other Ambulatory Visit: Payer: Self-pay

## 2020-06-04 NOTE — Telephone Encounter (Signed)
Left message with rescheduled upcoming appointments per provider's request. Gave option to call back to reschedule if needed.

## 2020-06-09 NOTE — Anesthesia Preprocedure Evaluation (Addendum)
Anesthesia Evaluation  Patient identified by MRN, date of birth, ID band Patient awake    Reviewed: Allergy & Precautions, NPO status , Patient's Chart, lab work & pertinent test results  Airway Mallampati: I  TM Distance: >3 FB Neck ROM: Full    Dental no notable dental hx. (+) Teeth Intact, Dental Advisory Given   Pulmonary neg pulmonary ROS,    Pulmonary exam normal breath sounds clear to auscultation       Cardiovascular negative cardio ROS Normal cardiovascular exam Rhythm:Regular Rate:Normal     Neuro/Psych  Headaches, PSYCHIATRIC DISORDERS Anxiety Depression    GI/Hepatic Neg liver ROS, GERD  Medicated,  Endo/Other  negative endocrine ROS  Renal/GU negative Renal ROS  negative genitourinary   Musculoskeletal Right breast cancer   Abdominal   Peds  Hematology  (+) Blood dyscrasia, anemia ,   Anesthesia Other Findings   Reproductive/Obstetrics                            Anesthesia Physical  Anesthesia Plan  ASA: II  Anesthesia Plan: General   Post-op Pain Management:    Induction: Intravenous  PONV Risk Score and Plan: 3 and Ondansetron, Dexamethasone, Midazolam, Treatment may vary due to age or medical condition and Propofol infusion  Airway Management Planned: LMA  Additional Equipment: None  Intra-op Plan:   Post-operative Plan: Extubation in OR  Informed Consent: I have reviewed the patients History and Physical, chart, labs and discussed the procedure including the risks, benefits and alternatives for the proposed anesthesia with the patient or authorized representative who has indicated his/her understanding and acceptance.     Dental advisory given  Plan Discussed with: CRNA and Anesthesiologist  Anesthesia Plan Comments: (    )       Anesthesia Quick Evaluation

## 2020-06-10 ENCOUNTER — Ambulatory Visit (HOSPITAL_BASED_OUTPATIENT_CLINIC_OR_DEPARTMENT_OTHER): Payer: 59 | Admitting: Anesthesiology

## 2020-06-10 ENCOUNTER — Encounter (HOSPITAL_BASED_OUTPATIENT_CLINIC_OR_DEPARTMENT_OTHER)
Admission: RE | Disposition: A | Discharge status: Home / Self Care | Payer: Self-pay | Source: Ambulatory Visit | Attending: Surgery

## 2020-06-10 ENCOUNTER — Ambulatory Visit (HOSPITAL_BASED_OUTPATIENT_CLINIC_OR_DEPARTMENT_OTHER)
Admission: RE | Admit: 2020-06-10 | Discharge: 2020-06-10 | Disposition: A | Discharge status: Home / Self Care | Payer: 59 | Source: Ambulatory Visit | Attending: Surgery | Admitting: Surgery

## 2020-06-10 ENCOUNTER — Ambulatory Visit (HOSPITAL_COMMUNITY): Payer: 59

## 2020-06-10 ENCOUNTER — Encounter (HOSPITAL_BASED_OUTPATIENT_CLINIC_OR_DEPARTMENT_OTHER): Payer: Self-pay | Admitting: Surgery

## 2020-06-10 ENCOUNTER — Other Ambulatory Visit: Payer: Self-pay

## 2020-06-10 DIAGNOSIS — C50911 Malignant neoplasm of unspecified site of right female breast: Secondary | ICD-10-CM | POA: Insufficient documentation

## 2020-06-10 DIAGNOSIS — Z79899 Other long term (current) drug therapy: Secondary | ICD-10-CM | POA: Diagnosis not present

## 2020-06-10 DIAGNOSIS — C773 Secondary and unspecified malignant neoplasm of axilla and upper limb lymph nodes: Secondary | ICD-10-CM | POA: Insufficient documentation

## 2020-06-10 DIAGNOSIS — Z452 Encounter for adjustment and management of vascular access device: Secondary | ICD-10-CM

## 2020-06-10 DIAGNOSIS — Z79811 Long term (current) use of aromatase inhibitors: Secondary | ICD-10-CM | POA: Insufficient documentation

## 2020-06-10 DIAGNOSIS — Z170 Estrogen receptor positive status [ER+]: Secondary | ICD-10-CM | POA: Diagnosis not present

## 2020-06-10 DIAGNOSIS — Z419 Encounter for procedure for purposes other than remedying health state, unspecified: Secondary | ICD-10-CM

## 2020-06-10 HISTORY — PX: PORTACATH PLACEMENT: SHX2246

## 2020-06-10 LAB — POCT PREGNANCY, URINE: Preg Test, Ur: NEGATIVE

## 2020-06-10 IMAGING — CR DG CHEST 1V PORT
1 series · 1 of 1 positions shown · non-contrast
Comparison: [DATE].  [DATE].

CLINICAL DATA: Port-A-Cath insertion.  History of breast cancer

EXAM:
PORTABLE CHEST 1 VIEW

[chest ap]
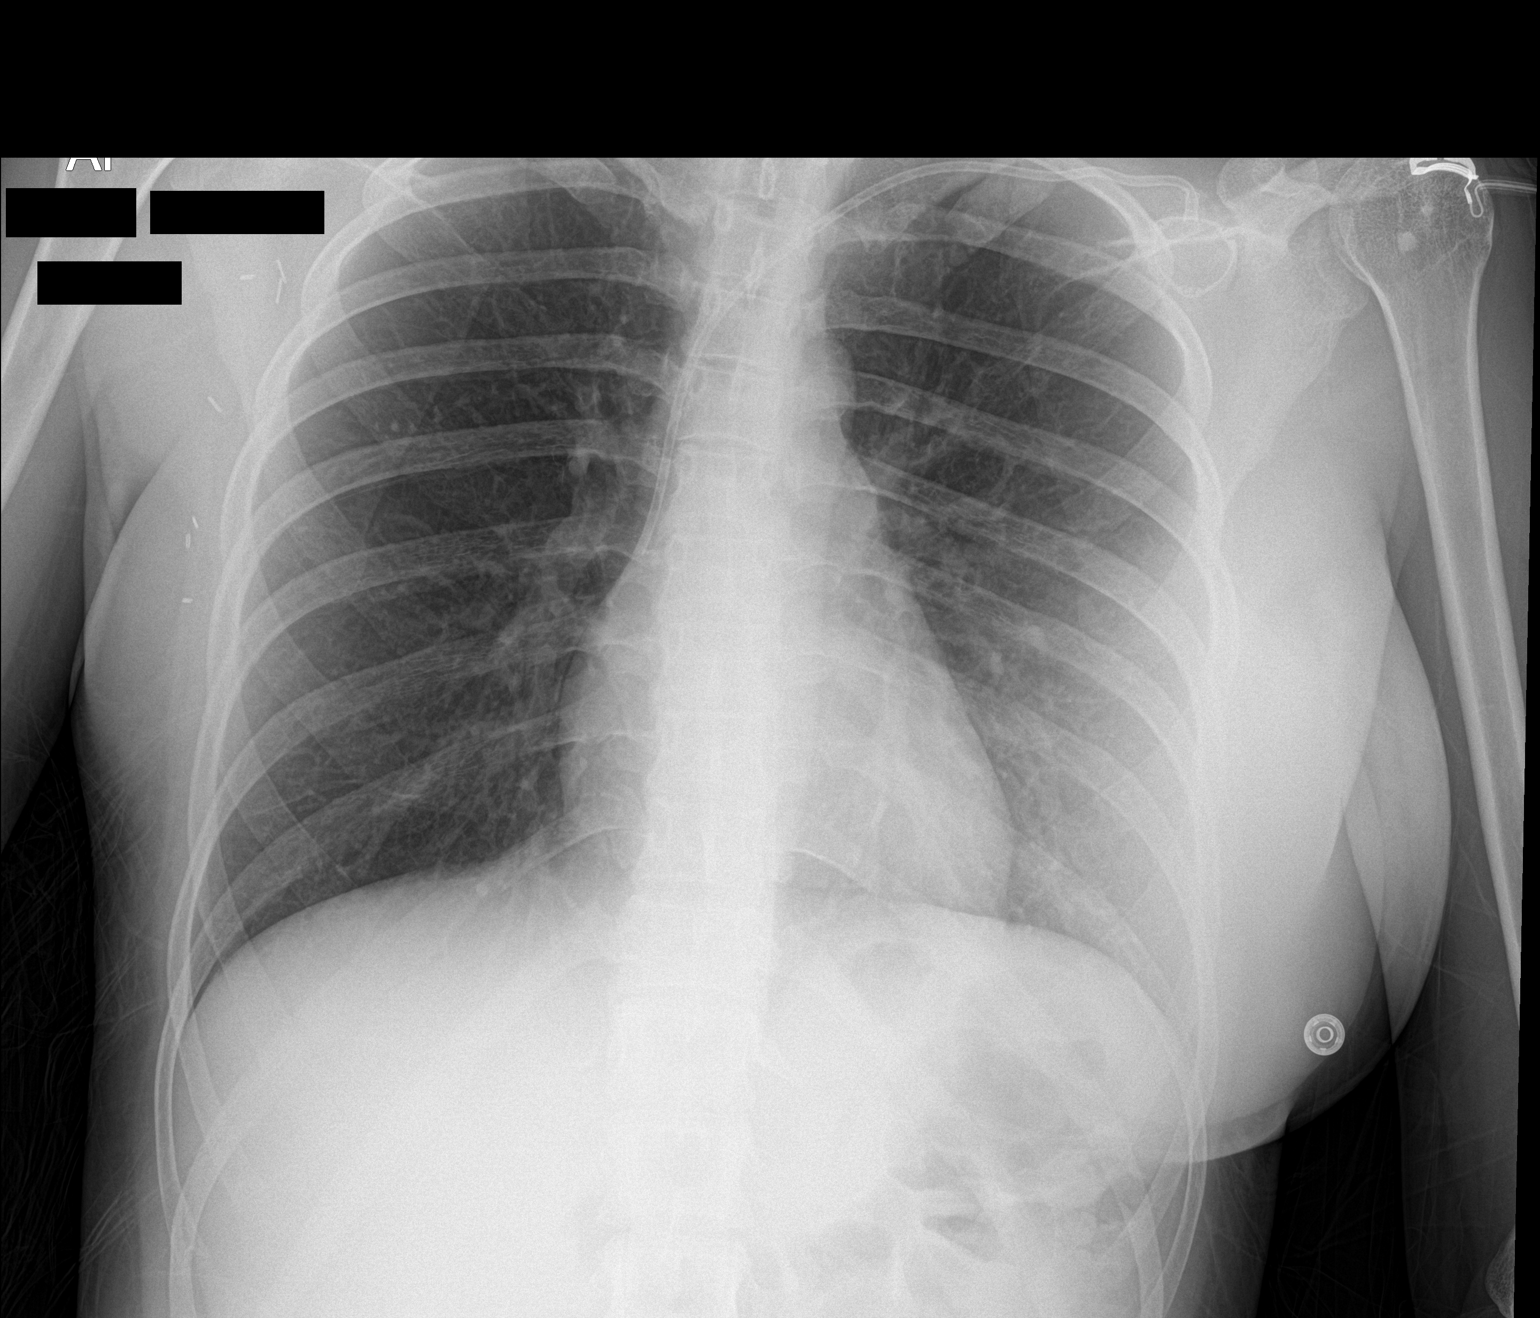

[1 of 1 positions shown; findings below may reference images not displayed]

FINDINGS: Port-A-Cath noted with tip over SVC. Heart size normal. Postsurgical
changes right breast. Right chest drainage catheter is been removed.
No pleural effusion or pneumothorax. Heart size normal. Stable small
sclerotic foci proximal left humerus most likely bone island. No
acute bony abnormality identified.
IMPRESSION: 1.  PowerPort catheter noted with tip over SVC.  No pneumothorax.

2. Postsurgical changes right breast. Right chest drainage catheter
has been removed.

## 2020-06-10 SURGERY — INSERTION, TUNNELED CENTRAL VENOUS DEVICE, WITH PORT
Anesthesia: General | Site: Neck | Laterality: Left

## 2020-06-10 MED ORDER — ONDANSETRON HCL 4 MG/2ML IJ SOLN
4.0000 mg | Freq: Once | INTRAMUSCULAR | Status: AC | PRN
  Administered 2020-06-10: 4 mg via INTRAVENOUS

## 2020-06-10 MED ORDER — HEPARIN SOD (PORK) LOCK FLUSH 100 UNIT/ML IV SOLN
INTRAVENOUS | Status: DC | PRN
  Administered 2020-06-10: 500 [IU] via INTRAVENOUS

## 2020-06-10 MED ORDER — BUPIVACAINE-EPINEPHRINE 0.25% -1:200000 IJ SOLN
INTRAMUSCULAR | Status: DC | PRN
  Administered 2020-06-10: 3 mL

## 2020-06-10 MED ORDER — CEFAZOLIN SODIUM-DEXTROSE 2-4 GM/100ML-% IV SOLN
INTRAVENOUS | Status: AC
  Filled 2020-06-10: qty 100

## 2020-06-10 MED ORDER — MEPERIDINE HCL 25 MG/ML IJ SOLN: 6.2500 mg | INTRAMUSCULAR | Status: DC | PRN

## 2020-06-10 MED ORDER — DEXAMETHASONE SODIUM PHOSPHATE 10 MG/ML IJ SOLN
INTRAMUSCULAR | Status: DC | PRN
  Administered 2020-06-10: 5 mg via INTRAVENOUS

## 2020-06-10 MED ORDER — BUPIVACAINE HCL (PF) 0.25 % IJ SOLN
INTRAMUSCULAR | Status: AC
  Filled 2020-06-10: qty 60

## 2020-06-10 MED ORDER — ACETAMINOPHEN 500 MG PO TABS
1000.0000 mg | ORAL_TABLET | ORAL | Status: AC
  Administered 2020-06-10: 1000 mg via ORAL

## 2020-06-10 MED ORDER — ACETAMINOPHEN 325 MG PO TABS: 325.0000 mg | ORAL_TABLET | ORAL | Status: DC | PRN

## 2020-06-10 MED ORDER — ONDANSETRON HCL 4 MG/2ML IJ SOLN
INTRAMUSCULAR | Status: AC
  Filled 2020-06-10: qty 2

## 2020-06-10 MED ORDER — PROPOFOL 500 MG/50ML IV EMUL
INTRAVENOUS | Status: DC | PRN
  Administered 2020-06-10: 150 ug/kg/min via INTRAVENOUS

## 2020-06-10 MED ORDER — CEFAZOLIN SODIUM-DEXTROSE 2-4 GM/100ML-% IV SOLN
2.0000 g | INTRAVENOUS | Status: AC
  Administered 2020-06-10: 2 g via INTRAVENOUS

## 2020-06-10 MED ORDER — FENTANYL CITRATE (PF) 100 MCG/2ML IJ SOLN
INTRAMUSCULAR | Status: DC | PRN
  Administered 2020-06-10 (×2): 25 ug via INTRAVENOUS
  Administered 2020-06-10: 50 ug via INTRAVENOUS

## 2020-06-10 MED ORDER — OXYCODONE HCL 5 MG/5ML PO SOLN: 5.0000 mg | Freq: Once | ORAL | Status: DC | PRN

## 2020-06-10 MED ORDER — OXYCODONE HCL 5 MG PO TABS: 5.0000 mg | ORAL_TABLET | Freq: Once | ORAL | Status: DC | PRN

## 2020-06-10 MED ORDER — HEPARIN (PORCINE) IN NACL 2-0.9 UNITS/ML
INTRAMUSCULAR | Status: AC | PRN
  Administered 2020-06-10: 500 mL via INTRAVENOUS

## 2020-06-10 MED ORDER — PROPOFOL 10 MG/ML IV BOLUS
INTRAVENOUS | Status: AC
  Filled 2020-06-10: qty 40

## 2020-06-10 MED ORDER — MIDAZOLAM HCL 2 MG/2ML IJ SOLN
INTRAMUSCULAR | Status: AC
  Filled 2020-06-10: qty 2

## 2020-06-10 MED ORDER — EPHEDRINE SULFATE 50 MG/ML IJ SOLN
INTRAMUSCULAR | Status: DC | PRN
  Administered 2020-06-10 (×4): 10 mg via INTRAVENOUS

## 2020-06-10 MED ORDER — DROPERIDOL 2.5 MG/ML IJ SOLN
INTRAMUSCULAR | Status: DC | PRN
  Administered 2020-06-10: .625 mg via INTRAVENOUS

## 2020-06-10 MED ORDER — LACTATED RINGERS IV SOLN: INTRAVENOUS | Status: DC

## 2020-06-10 MED ORDER — ONDANSETRON HCL 4 MG/2ML IJ SOLN
4.0000 mg | Freq: Once | INTRAMUSCULAR | Status: AC
  Administered 2020-06-10: 4 mg via INTRAVENOUS

## 2020-06-10 MED ORDER — ACETAMINOPHEN 500 MG PO TABS
ORAL_TABLET | ORAL | Status: AC
  Filled 2020-06-10: qty 2

## 2020-06-10 MED ORDER — HEPARIN (PORCINE) IN NACL 1000-0.9 UT/500ML-% IV SOLN
INTRAVENOUS | Status: AC
  Filled 2020-06-10: qty 500

## 2020-06-10 MED ORDER — PROPOFOL 10 MG/ML IV BOLUS
INTRAVENOUS | Status: DC | PRN
  Administered 2020-06-10: 100 mg via INTRAVENOUS

## 2020-06-10 MED ORDER — SODIUM BICARBONATE 4.2 % IV SOLN
INTRAVENOUS | Status: AC
  Filled 2020-06-10: qty 10

## 2020-06-10 MED ORDER — DEXAMETHASONE SODIUM PHOSPHATE 10 MG/ML IJ SOLN
INTRAMUSCULAR | Status: AC
  Filled 2020-06-10: qty 1

## 2020-06-10 MED ORDER — FENTANYL CITRATE (PF) 100 MCG/2ML IJ SOLN
INTRAMUSCULAR | Status: AC
  Filled 2020-06-10: qty 2

## 2020-06-10 MED ORDER — CHLORHEXIDINE GLUCONATE CLOTH 2 % EX PADS: 6.0000 | MEDICATED_PAD | Freq: Once | CUTANEOUS | Status: DC

## 2020-06-10 MED ORDER — ACETAMINOPHEN 160 MG/5ML PO SOLN
325.0000 mg | ORAL | Status: DC | PRN
Start: 2020-06-10 — End: 2020-06-10

## 2020-06-10 MED ORDER — BUPIVACAINE-EPINEPHRINE (PF) 0.25% -1:200000 IJ SOLN
INTRAMUSCULAR | Status: AC
  Filled 2020-06-10: qty 30

## 2020-06-10 MED ORDER — HEPARIN SOD (PORK) LOCK FLUSH 100 UNIT/ML IV SOLN
INTRAVENOUS | Status: AC
  Filled 2020-06-10: qty 5

## 2020-06-10 MED ORDER — FENTANYL CITRATE (PF) 100 MCG/2ML IJ SOLN: 25.0000 ug | INTRAMUSCULAR | Status: DC | PRN

## 2020-06-10 MED ORDER — MIDAZOLAM HCL 2 MG/2ML IJ SOLN
INTRAMUSCULAR | Status: DC | PRN
  Administered 2020-06-10: 2 mg via INTRAVENOUS

## 2020-06-10 SURGICAL SUPPLY — 51 items
APL PRP STRL LF DISP 70% ISPRP (MISCELLANEOUS) ×1
APL SKNCLS STERI-STRIP NONHPOA (GAUZE/BANDAGES/DRESSINGS) ×1
BAG DECANTER FOR FLEXI CONT (MISCELLANEOUS) ×2 IMPLANT
BENZOIN TINCTURE PRP APPL 2/3 (GAUZE/BANDAGES/DRESSINGS) ×2 IMPLANT
BLADE SURG 11 STRL SS (BLADE) ×2 IMPLANT
BLADE SURG 15 STRL LF DISP TIS (BLADE) ×1 IMPLANT
BLADE SURG 15 STRL SS (BLADE) ×4
CANISTER SUCT 1200ML W/VALVE (MISCELLANEOUS) IMPLANT
CHLORAPREP W/TINT 26 (MISCELLANEOUS) ×2 IMPLANT
CLEANER CAUTERY TIP 5X5 PAD (MISCELLANEOUS) ×1 IMPLANT
COVER BACK TABLE 60X90IN (DRAPES) ×2 IMPLANT
COVER MAYO STAND STRL (DRAPES) ×2 IMPLANT
COVER PROBE 5X48 (MISCELLANEOUS) ×2
COVER WAND RF STERILE (DRAPES) IMPLANT
DECANTER SPIKE VIAL GLASS SM (MISCELLANEOUS) ×2 IMPLANT
DRAPE C-ARM 42X72 X-RAY (DRAPES) ×2 IMPLANT
DRAPE LAPAROTOMY TRNSV 102X78 (DRAPES) ×2 IMPLANT
DRAPE UTILITY XL STRL (DRAPES) ×2 IMPLANT
DRSG TEGADERM 4X4.75 (GAUZE/BANDAGES/DRESSINGS) ×2 IMPLANT
ELECT REM PT RETURN 9FT ADLT (ELECTROSURGICAL) ×2
ELECTRODE REM PT RTRN 9FT ADLT (ELECTROSURGICAL) ×1 IMPLANT
GAUZE SPONGE 4X4 12PLY STRL LF (GAUZE/BANDAGES/DRESSINGS) IMPLANT
GLOVE SURG ENC MOIS LTX SZ6.5 (GLOVE) ×1 IMPLANT
GLOVE SURG ENC MOIS LTX SZ7 (GLOVE) ×2 IMPLANT
GLOVE SURG UNDER POLY LF SZ7.5 (GLOVE) ×2 IMPLANT
GOWN STRL REUS W/ TWL LRG LVL3 (GOWN DISPOSABLE) ×1 IMPLANT
GOWN STRL REUS W/TWL LRG LVL3 (GOWN DISPOSABLE) ×8
IV KIT MINILOC 20X1 SAFETY (NEEDLE) IMPLANT
KIT CVR 48X5XPRB PLUP LF (MISCELLANEOUS) IMPLANT
KIT PORT POWER 8FR ISP CVUE (Port) ×1 IMPLANT
NDL HYPO 25X1 1.5 SAFETY (NEEDLE) ×1 IMPLANT
NDL SAFETY ECLIPSE 18X1.5 (NEEDLE) IMPLANT
NDL SPNL 22GX3.5 QUINCKE BK (NEEDLE) IMPLANT
NEEDLE HYPO 18GX1.5 SHARP (NEEDLE)
NEEDLE HYPO 25X1 1.5 SAFETY (NEEDLE) ×2 IMPLANT
NEEDLE SPNL 22GX3.5 QUINCKE BK (NEEDLE) IMPLANT
PACK BASIN DAY SURGERY FS (CUSTOM PROCEDURE TRAY) ×2 IMPLANT
PAD CLEANER CAUTERY TIP 5X5 (MISCELLANEOUS) ×1
PENCIL SMOKE EVACUATOR (MISCELLANEOUS) ×2 IMPLANT
SLEEVE SCD COMPRESS KNEE MED (STOCKING) ×2 IMPLANT
SPONGE GAUZE 2X2 8PLY STRL LF (GAUZE/BANDAGES/DRESSINGS) ×2 IMPLANT
STRIP CLOSURE SKIN 1/2X4 (GAUZE/BANDAGES/DRESSINGS) ×2 IMPLANT
SUT MON AB 4-0 PC3 18 (SUTURE) ×2 IMPLANT
SUT PROLENE 2 0 CT2 30 (SUTURE) ×2 IMPLANT
SUT VIC AB 3-0 SH 27 (SUTURE) ×2
SUT VIC AB 3-0 SH 27X BRD (SUTURE) ×1 IMPLANT
SYR 5ML LUER SLIP (SYRINGE) ×3 IMPLANT
SYR CONTROL 10ML LL (SYRINGE) ×2 IMPLANT
TOWEL GREEN STERILE FF (TOWEL DISPOSABLE) ×2 IMPLANT
TUBE CONNECTING 20X1/4 (TUBING) IMPLANT
YANKAUER SUCT BULB TIP NO VENT (SUCTIONS) IMPLANT

## 2020-06-10 NOTE — Op Note (Signed)
Preop diagnosis: Right breast cancer with axillary lymph node metastases Postop diagnosis: Same Procedure performed: Left subclavian vein port placement Surgeon:Theresa Wedel K Maxmilian Trostel Anesthesia: General via LMA Indications: This is a 35 year old female with right breast cancer status postmastectomy and axillary lymph node dissection.  She is scheduled to begin chemotherapy.  She presents now for port placement.  She is expected to have radiation to her right chest wall as well.  Therefore I made the decision to place the port on her left side.   Description of procedure: The patient is brought to the operating room placed in the supine position on the operating table.  After an adequate level of general anesthesia was obtained, the patient arms were tucked at her side.  Her left chest and neck were prepped with ChloraPrep and draped sterile fashion.  A timeout was taken to ensure the proper patient and proper procedure.  She was placed in Trendelenburg position.    The 18-gauge needle was used in a left subclavian approach to cannulate the subclavian vein on first pass. The wire passed easily.  Fluoroscopy confirmed that the wire headed down the right side of the mediastinum.  The needle was removed.  We created a subcutaneous pocket below the left clavicle.  We first anesthetized with local anesthetic.  We created a subcutaneous tunnel from the subcutaneous pocket to the insertion site below the clavicle.  An 8 French [place] port was assembled and was tunneled from the subcutaneous pocket to the insertion site.  The catheter was cut to the appropriate length using fluoroscopic guidance.  Using fluoroscopic guidance, we passed the dilator and breakaway sheath over the wire.  The wire and dilator were removed.  The catheter was then advanced through the sheath which was removed.  Fluoroscopy confirmed that there were no kinks along the length of the catheter.  We are able to aspirate blood easily through the  port and were able to flush easily.  The port was secured with two interrupted 2-0 Prolene sutures.  3-0 Vicryl was used to close the subcutaneous tissue and 4-0 Monocryl was used to close the skin at both sites.    I instilled concentrated heparin solution.  Benzoin and Steri-Strips were applied.  An occlusive dressing was placed.  The patient was then extubated and brought to the recovery room in stable condition.  All sponge, instrument, and needle counts are correct.  Post-op chest x-ray is pending.  Imogene Burn. Georgette Dover, MD, [facility] Surgery  General/ Trauma Surgery   06/10/2020 8:26 AM      \

## 2020-06-10 NOTE — Anesthesia Postprocedure Evaluation (Signed)
Anesthesia Post Note  Patient: Caitlyn Miller  Procedure(s) Performed: INSERTION PORT-A-CATH (Left Neck)     Patient location during evaluation: PACU Anesthesia Type: General Level of consciousness: awake and alert Pain management: pain level controlled Vital Signs Assessment: post-procedure vital signs reviewed and stable Respiratory status: spontaneous breathing, nonlabored ventilation, respiratory function stable and patient connected to nasal cannula oxygen Cardiovascular status: blood pressure returned to baseline and stable Postop Assessment: no apparent nausea or vomiting Anesthetic complications: no   No complications documented.  Last Vitals:  Vitals:   06/10/20 0845 06/10/20 0906  BP: 122/65 129/65  Pulse: 97 90  Resp: 16 16  Temp:  36.5 C  SpO2: 100% 99%    Last Pain:  Vitals:   06/10/20 0906  TempSrc:   PainSc: 0-No pain                 Hanifa Antonetti

## 2020-06-10 NOTE — Discharge Instructions (Signed)

## 2020-06-10 NOTE — Transfer of Care (Signed)
Immediate Anesthesia Transfer of Care Note  Patient: Caitlyn Miller  Procedure(s) Performed: INSERTION PORT-A-CATH (Left Neck)  Patient Location: PACU  Anesthesia Type:General  Level of Consciousness: awake, alert  and oriented  Airway & Oxygen Therapy: Patient Spontanous Breathing and Patient connected to face mask oxygen  Post-op Assessment: Report given to RN and Post -op Vital signs reviewed and stable  Post vital signs: Reviewed and stable  Last Vitals:  Vitals Value Taken Time  BP    Temp    Pulse 105 06/10/20 0828  Resp 9 06/10/20 0828  SpO2 86 % 06/10/20 0828  Vitals shown include unvalidated device data.  Last Pain:  Vitals:   06/10/20 0651  TempSrc: Oral  PainSc: 3       Patients Stated Pain Goal: 3 (40/98/11 9147)  Complications: No complications documented.

## 2020-06-10 NOTE — Interval H&P Note (Signed)
History and Physical Interval Note:  06/10/2020 7:16 AM  Caitlyn Miller  has presented today for surgery, with the diagnosis of RIGHT BREAST CANCER.  The various methods of treatment have been discussed with the patient and family. After consideration of risks, benefits and other options for treatment, the patient has consented to  Procedure(s) with comments: INSERTION PORT-A-CATH (N/A) - 45 MINUTES ROOM 2 as a surgical intervention.  The patient's history has been reviewed, patient examined, no change in status, stable for surgery.  I have reviewed the patient's chart and labs.  Questions were answered to the patient's satisfaction.     Maia Petties

## 2020-06-10 NOTE — Anesthesia Procedure Notes (Signed)
Procedure Name: LMA Insertion Performed by: Verita Lamb, CRNA Pre-anesthesia Checklist: Patient identified, Emergency Drugs available, Suction available and Patient being monitored Patient Re-evaluated:Patient Re-evaluated prior to induction Oxygen Delivery Method: Circle system utilized Preoxygenation: Pre-oxygenation with 100% oxygen Induction Type: IV induction Ventilation: Mask ventilation without difficulty LMA: LMA inserted LMA Size: 3.0 Number of attempts: 1 Airway Equipment and Method: Bite block Placement Confirmation: positive ETCO2,  CO2 detector and breath sounds checked- equal and bilateral Tube secured with: Tape Dental Injury: Teeth and Oropharynx as per pre-operative assessment

## 2020-06-11 ENCOUNTER — Ambulatory Visit (HOSPITAL_COMMUNITY)
Admission: RE | Admit: 2020-06-11 | Discharge: 2020-06-11 | Disposition: A | Discharge status: Home / Self Care | Payer: 59 | Source: Ambulatory Visit | Attending: Hematology | Admitting: Hematology

## 2020-06-11 ENCOUNTER — Inpatient Hospital Stay: Payer: 59

## 2020-06-11 ENCOUNTER — Encounter (HOSPITAL_BASED_OUTPATIENT_CLINIC_OR_DEPARTMENT_OTHER): Payer: Self-pay | Admitting: Surgery

## 2020-06-11 ENCOUNTER — Other Ambulatory Visit: Payer: Self-pay

## 2020-06-11 ENCOUNTER — Inpatient Hospital Stay: Payer: 59 | Admitting: Hematology

## 2020-06-11 ENCOUNTER — Encounter: Payer: Self-pay | Admitting: Hematology

## 2020-06-11 DIAGNOSIS — C50411 Malignant neoplasm of upper-outer quadrant of right female breast: Secondary | ICD-10-CM | POA: Diagnosis present

## 2020-06-11 DIAGNOSIS — Z0189 Encounter for other specified special examinations: Secondary | ICD-10-CM | POA: Diagnosis not present

## 2020-06-11 DIAGNOSIS — Z170 Estrogen receptor positive status [ER+]: Secondary | ICD-10-CM | POA: Diagnosis not present

## 2020-06-11 LAB — ECHOCARDIOGRAM COMPLETE
AR max vel: 1.75 cm2
AV Area VTI: 1.84 cm2
AV Area mean vel: 1.75 cm2
AV Mean grad: 5 mmHg
AV Peak grad: 9.7 mmHg
Ao pk vel: 1.56 m/s
Area-P 1/2: 4.68 cm2
S' Lateral: 2.7 cm

## 2020-06-11 MED ORDER — PERFLUTREN LIPID MICROSPHERE
1.0000 mL | INTRAVENOUS | Status: AC | PRN
  Administered 2020-06-11: 5 mL via INTRAVENOUS
  Filled 2020-06-11: qty 10

## 2020-06-11 NOTE — Patient Instructions (Signed)
Pt & husband are here to discuss Doxorubicin, Cyclophosphamide, Pegfilgrastim., Paclitaxel.  Informed of possible side effects such as N/V, diarrhea/constipation, bone marrow suppression, fatigue, red urine, bladder irritation, late effects such as blood disorders, muscle/joint/bone pain, appetite/taste changes, liver dysfunction, hair loss, skin/nail changes, but not limited to these.  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 Informed of reasons to call & how to reach [place] 24/7.  Discussed Port & EMLA instructions given.  Discussed symptom management & Chemo ALert Card & instructions given. Discussed cryotherapy for neuropathy prevention. Toured treatment area & questions answered.

## 2020-06-16 ENCOUNTER — Inpatient Hospital Stay: Payer: 59 | Attending: Hematology | Admitting: Hematology

## 2020-06-16 ENCOUNTER — Other Ambulatory Visit: Payer: Self-pay

## 2020-06-16 ENCOUNTER — Encounter: Payer: Self-pay | Admitting: Hematology

## 2020-06-16 ENCOUNTER — Telehealth: Payer: Self-pay

## 2020-06-16 ENCOUNTER — Inpatient Hospital Stay: Payer: 59

## 2020-06-16 VITALS — BP 103/79 | HR 81 | Temp 98.0°F | Resp 18 | Wt 127.5 lb

## 2020-06-16 DIAGNOSIS — R109 Unspecified abdominal pain: Secondary | ICD-10-CM | POA: Insufficient documentation

## 2020-06-16 DIAGNOSIS — R110 Nausea: Secondary | ICD-10-CM | POA: Insufficient documentation

## 2020-06-16 DIAGNOSIS — Z5189 Encounter for other specified aftercare: Secondary | ICD-10-CM | POA: Diagnosis not present

## 2020-06-16 DIAGNOSIS — C50411 Malignant neoplasm of upper-outer quadrant of right female breast: Secondary | ICD-10-CM

## 2020-06-16 DIAGNOSIS — Z5111 Encounter for antineoplastic chemotherapy: Secondary | ICD-10-CM | POA: Diagnosis present

## 2020-06-16 DIAGNOSIS — Z170 Estrogen receptor positive status [ER+]: Secondary | ICD-10-CM

## 2020-06-16 DIAGNOSIS — R0789 Other chest pain: Secondary | ICD-10-CM | POA: Diagnosis not present

## 2020-06-16 DIAGNOSIS — C773 Secondary and unspecified malignant neoplasm of axilla and upper limb lymph nodes: Secondary | ICD-10-CM | POA: Diagnosis not present

## 2020-06-16 DIAGNOSIS — R0602 Shortness of breath: Secondary | ICD-10-CM

## 2020-06-16 LAB — CMP (CANCER CENTER ONLY)
ALT: 83 U/L — ABNORMAL HIGH (ref 0–44)
AST: 50 U/L — ABNORMAL HIGH (ref 15–41)
Albumin: 4.1 g/dL (ref 3.5–5.0)
Alkaline Phosphatase: 89 U/L (ref 38–126)
Anion gap: 12 (ref 5–15)
BUN: 10 mg/dL (ref 6–20)
CO2: 25 mmol/L (ref 22–32)
Calcium: 9.6 mg/dL (ref 8.9–10.3)
Chloride: 106 mmol/L (ref 98–111)
Creatinine: 0.66 mg/dL (ref 0.44–1.00)
GFR, Estimated: >60 mL/min (ref >60)
Glucose, Bld: 110 mg/dL — ABNORMAL HIGH (ref 70–99)
Potassium: 4.1 mmol/L (ref 3.5–5.1)
Sodium: 143 mmol/L (ref 135–145)
Total Bilirubin: 0.3 mg/dL (ref 0.3–1.2)
Total Protein: 7.7 g/dL (ref 6.5–8.1)

## 2020-06-16 LAB — CBC WITH DIFFERENTIAL (CANCER CENTER ONLY)
Abs Immature Granulocytes: 0.02 10*3/uL (ref 0.00–0.07)
Basophils Absolute: 0 10*3/uL (ref 0.0–0.1)
Basophils Relative: 0 %
Eosinophils Absolute: 0.2 10*3/uL (ref 0.0–0.5)
Eosinophils Relative: 3 %
HCT: 35.5 % — ABNORMAL LOW (ref 36.0–46.0)
Hemoglobin: 12.1 g/dL (ref 12.0–15.0)
Immature Granulocytes: 0 %
Lymphocytes Relative: 40 %
Lymphs Abs: 2.7 10*3/uL (ref 0.7–4.0)
MCH: 31.8 pg (ref 26.0–34.0)
MCHC: 34.1 g/dL (ref 30.0–36.0)
MCV: 93.2 fL (ref 80.0–100.0)
Monocytes Absolute: 0.7 10*3/uL (ref 0.1–1.0)
Monocytes Relative: 10 %
Neutro Abs: 3.1 10*3/uL (ref 1.7–7.7)
Neutrophils Relative %: 47 %
Platelet Count: 217 10*3/uL (ref 150–400)
RBC: 3.81 MIL/uL — ABNORMAL LOW (ref 3.87–5.11)
RDW: 11.7 % (ref 11.5–15.5)
WBC Count: 6.7 10*3/uL (ref 4.0–10.5)
nRBC: 0 % (ref 0.0–0.2)

## 2020-06-16 LAB — D-DIMER, QUANTITATIVE: D-Dimer, Quant: 0.99 ug/mL-FEU — ABNORMAL HIGH (ref 0.00–0.50)

## 2020-06-16 NOTE — Telephone Encounter (Signed)
Spoke with patients husband who stated patient is having SOB, is extremely tired and is having difficulty walking. Onset was after Echo on 06/11/2020.  This nurse spoke with Dr Burr Medico who advised patient to come in for an office visit today.  Husband acknowledged understanding and agreed.  No further questions or concerns at this time.

## 2020-06-16 NOTE — Progress Notes (Addendum)
[name]   Telephone:(336) [phone] Fax:(336) [phone]   Clinic Follow up Note   Patient Care Team: [name], Lilia Argue, MD as PCP - General (Family Medicine) Mansouraty, Telford Nab., MD as Consulting Physician (Gastroenterology) Mauro Kaufmann, RN as Oncology Nurse Navigator Rockwell Germany, RN as Oncology Nurse Navigator Donnie Mesa, MD as Consulting Physician (General Surgery) Truitt Merle, MD as Consulting Physician (Hematology) Kyung Rudd, MD as Consulting Physician (Radiation Oncology) Contogiannis, Audrea Muscat, MD as Consulting Physician (Plastic Surgery)  Date of Service:  06/16/2020  CHIEF COMPLAINT: Shortness of breath   SUMMARY OF ONCOLOGIC HISTORY: Oncology History Overview Note  Cancer Staging Malignant neoplasm of upper-outer quadrant of right breast in female, estrogen receptor positive ([place]) Staging form: Breast, AJCC 8th Edition - Clinical stage from 11/12/2019: Stage IIA (cT2, cN1, cM0, G2, ER+, PR+, HER2-) - Signed by Truitt Merle, MD on 11/19/2019 Stage prefix: Initial diagnosis Histologic grading system: 3 grade system - Pathologic stage from 04/15/2020: No Stage Recommended (ypT2, pN2a, cM0, G2, ER+, PR+, HER2-) - Signed by Truitt Merle, MD on 05/27/2020 Stage prefix: Post-therapy Histologic grading system: 3 grade system Residual tumor (R): R1 - Microscopic    Malignant neoplasm of upper-outer quadrant of right breast in female, estrogen receptor positive ([place])  11/05/2019 Mammogram   IMPRESSION: Large irregular palpable mass 4.7 x 1.4 x 1.4 cm within the upper-outer right breast 11 o'clock position, 4 cm from nipple.  There is a large area of associated coarse heterogeneous calcifications within the mass. Overall findings are concerning for breast carcinoma.   Two cortically thickened right axillary lymph nodes which are indeterminate in etiology.   11/12/2019 Cancer Staging   Staging form: Breast, AJCC 8th Edition - Clinical stage from  11/12/2019: Stage IIA (cT2, cN1, cM0, G2, ER+, PR+, HER2-) - Signed by Truitt Merle, MD on 11/19/2019   11/12/2019 Initial Biopsy   Diagnosis 1. Breast, right, needle core biopsy, right - INVASIVE MAMMARY CARCINOMA, SEE COMMENT. - MAMMARY CARCINOMA IN SITU. 2. Lymph node, needle/core biopsy, right - METASTATIC MAMMARY CARCINOMA. Microscopic Comment 1. The carcinoma appears grade 2 and measures 16 mm in greatest linear extent. E-cadherin will be ordered. Prognostic makers will be ordered. Dr. Saralyn Pilar has reviewed the case. The case was called to [place] on 01/13/2020.    1. E-cadherin is strongly positive consistent with a ductal phenotype.   11/12/2019 Receptors her2   1. PROGNOSTIC INDICATORS Results: IMMUNOHISTOCHEMICAL AND MORPHOMETRIC ANALYSIS PERFORMED MANUALLY The tumor cells are NEGATIVE for Her2 (0). Estrogen Receptor: 100%, POSITIVE, STRONG STAINING INTENSITY Progesterone Receptor: 100%, POSITIVE, STRONG STAINING INTENSITY Proliferation Marker Ki67: 5%   11/17/2019 Initial Diagnosis   Malignant neoplasm of upper-outer quadrant of right breast in female, estrogen receptor positive ([name])   11/25/2019 Breast MRI   IMPRESSION: 1. Large area of non-mass enhancement involving the UPPER OUTER QUADRANT of the RIGHT breast measuring approximately 4.2 x 4.1 x 2.4 cm. The biopsy-proven IDC and DCIS is present at the superomedial aspect of this NME. 2. No MRI evidence of malignancy involving the LEFT breast. 3. Intact BILATERAL retropectoral implants. 4. 2 pathologically enlarged RIGHT axillary lymph nodes. One of these nodes is biopsy-proven metastatic disease. No pathologic lymphadenopathy elsewhere   11/25/2019 PET scan   IMPRESSION: 1. Two mildly enlarged hypermetabolic right axillary lymph nodes compatible with right axillary nodal metastases. 2. No additional sites of hypermetabolic metastatic disease. 3. Asymmetric indistinct upper right breast  hypermetabolism, without discrete mass correlate on the  CT images, compatible with known primary right breast malignancy.     11/26/2019 Genetic Testing   Negative genetic testing: no pathogenic variants detected in [facility] STAT Breast Cancer Panel.  Variant of uncertain significance (VUS) detected in CHEK2 at c.1556G>T (p.Arg519Leu).  The report date is November 26, 2019.   The STAT Breast cancer panel offered by [facility] includes sequencing and rearrangement analysis for the following 9 genes:  ATM, BRCA1, BRCA2, CDH1, CHEK2, PALB2, PTEN, STK11 and TP53.    Results of [facility] Multi-Cancer Panel are pending.    12/01/2019 -  Anti-estrogen oral therapy   Neoadjuvant Tamoxifen 18m on 12/01/19. Reduced to 112mon 12/09/19.       ---Zoladex injection monthly starting 12/09/19.       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  Variants of uncertain significance detected RAD51D at c.715C>T (p.Arg239Trp) and CHEK2 at c.1556G>T (p.Arg519Leu).  The report date is December 08, 2019.   The Multi-Cancer Panel offered by [facility] includes sequencing and/or deletion duplication testing of the following 85 genes: AIP, ALK, APC, ATM, AXIN2,BAP1,  BARD1, BLM, BMPR1A, BRCA1, BRCA2, BRIP1, CASR, CDC73, CDH1, CDK4, CDKN1B, CDKN1C, CDKN2A (p14ARF), CDKN2A (p16INK4a), CEBPA, CHEK2, CTNNA1, DICER1, DIS3L2, EGFR (c.2369C>T, p.Thr790Met variant only), EPCAM (Deletion/duplication testing only), FH, FLCN, GATA2, GPC3, GREM1 (Promoter region deletion/duplication testing only), HOXB13 (c.251G>A, p.Gly84Glu), HRAS, KIT, MAX, MEN1, MET, MITF (c.952G>A, p.Glu318Lys variant only), MLH1, MSH2, MSH3, MSH6, MUTYH, NBN, NF1, NF2, NTHL1, PALB2, PDGFRA, PHOX2B, PMS2, POLD1, POLE, POT1, PRKAR1A, PTCH1, PTEN, RAD50, RAD51C, RAD51D, RB1, RECQL4, RET, RNF43, RUNX1, SDHAF2, SDHA  (sequence changes only), SDHB, SDHC, SDHD, SMAD4, SMARCA4, SMARCB1, SMARCE1, STK11, SUFU, TERC, TERT, TMEM127, TP53, TSC1, TSC2, VHL, WRN and WT1.    12/09/2019 Miscellaneous   Mammaprint luminal type A, low risk  10% risk of recurrence in 10 years with 97.8% benefor of hormaonal therapy alone.    01/12/2020 - 03/25/2020 Chemotherapy   Verzenio started on 01/12/2020, dose reduced to 5038mm and 100m78m11/2022 due to poor tolerance. Held Verzenio on 03/25/20 to proceed with breast surgery.    03/01/2020 Mammogram   Targeted ultrasound is performed, showing interval decreasing conspicuity of the patient's known right breast cancer. A post biopsy clip with an associated irregular area shadowing is demonstrated at the 11 o'clock position 4 cm from the nipple. Exact measurements are difficult due to the vague appearance of the findings. It measures approximately 1.2 x 0.9 x 0.6 cm (previously 4.7 x 2.2 x 1.5 cm).   IMPRESSION: Imaging findings consistent with good response to chemotherapy.   03/16/2020 Imaging   MRI Breast  IMPRESSION: 1. Interval resolution of RIGHT axillary adenopathy. 2. Slightly smaller area of non mass enhancement in the UPPER-OUTER QUADRANT of the RIGHT breast.   04/15/2020 Surgery   RIGHT BREAST LUMPECTOMY WITH RADIOACTIVE SEED LOCALIZATION and RADIOACTIVE SEED GUIDED RIGHT AXILLARY SENTINEL LYMPH NODE DISSECTION and SENTINEL NODE BIOPSY by Dr TsueGeorgette Dover3/31/2022 Pathology Results   FINAL MICROSCOPIC DIAGNOSIS:   A. 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  - Invasive carcinoma broadly involves each the anterior, posterior,  superior and inferior margins.  - DCIS involves the inferior margin and is < 1 mm from each the anterior  and superior margins.  - Atypical lobular hyperplasia.   - Biopsy sites.  - See oncology table.   B. TARGETED LYMPH NODE, RIGHT AXILLA, BIOPSY:  - Metastatic carcinoma in (1) of (1) lymph node.  - Biopsy site.   C. SENTINEL LYMPH NODE, RIGHT AXILLA #1, BIOPSY:  - Metastatic carcinoma in (1) of (1) lymph node.   D. SENTINEL LYMPH NODE, RIGHT AXILLA #2, BIOPSY:  - One lymph node, negative for carcinoma (0/1).    04/15/2020 Cancer Staging   Staging form: Breast, AJCC 8th Edition - Pathologic stage from 04/15/2020: No Stage Recommended (ypT2, pN2a, cM0, G2, ER+, PR+, HER2-) - Signed by Truitt Merle, MD on 05/27/2020 Stage prefix: Post-therapy Histologic grading system: 3 grade system Residual tumor (R): R1 - Microscopic   05/18/2020 Surgery   Right Mastectomy  A. BREAST, RIGHT, MASTECTOMY:  - Residual invasive carcinoma with mixed ductal and lobular features.       Residual invasive carcinoma broadly involves the anterior inferior  soft tissue margin.       Residual invasive carcinoma is focally < 1 mm from the deep margin.  - Residual ductal carcinoma in situ.       Residual DCIS is 3 mm from the closest margin, deep.  - Residual atypical lobular hyperplasia.  - Prior procedure site changes.  - Biopsy clip (X2).   B. BREAST IMPLANT, RIGHT, EXPLANTATION:  - Implant (gross only).   C. LYMPH NODES, RIGHT AXILLA, DISSECTION:  - Metastatic carcinoma in (2) of (12) lymph nodes.  - Prior procedure site changes in surrounding soft tissue.    06/24/2020 -  Chemotherapy    Patient is on Treatment Plan: BREAST ADJUVANT DOSE DENSE AC Q14D / PACLITAXEL Q7D         CURRENT THERAPY:  Neoadjuvant Tamoxifen 26mdaily startedon 12/01/19. Reduced to 126mon 11/23/21due to poor tolerance.Zoladex injection monthly starting 12/09/19.Switched Tamoxifen to Anastrozole on 01/06/20.  INTERVAL HISTORY:  ImMaryum Battersons here for a follow up of right breast cancer. She was last seen by me 05/27/20. She presents to the clinic with her husband. She notes  she had PAC placed 5/26 and Echo on 5/27 which was clear. She notes since the Echo she has chest tightness and SOB or episodes where she stops breathing when she sleeps on her right side. She notes pain with deep breath. She notes when she moves she feels she is going to pass out. She notes when she stands she feels lightheaded. She notes in the past week this overall her worsened. She notes the first 2 days after her Echo she had mild trouble swallowing but stopped. She notes she is mostly able to eat. She denies any fever and BP is normal. She notes she is not able to stand in her shower. I reviewed her medication list with her. She wonders if the contrast from echo may have caused this.    REVIEW OF SYSTEMS:   Constitutional: Denies fevers, chills or abnormal weight loss (+) Lightheadedness with standing (+) Fatigue Eyes: Denies blurriness of vision Ears, nose, mouth, throat, and face: Denies mucositis or sore throat Respiratory: Denies cough, wheezes (+) Positional SOB and chest tightness  Cardiovascular: Denies palpitation or lower extremity swelling Gastrointestinal:  Denies nausea, heartburn or change in bowel habits Skin: Denies abnormal skin rashes Lymphatics: Denies new lymphadenopathy or easy bruising Neurological:Denies numbness, tingling or new weaknesses Behavioral/Psych: Mood is stable, no new changes  All other systems were reviewed with the patient and are negative.  MEDICAL HISTORY:  Past Medical History:  Diagnosis Date  . Abnormal Pap smear   . Acid reflux   . Anemia   . Anxiety   . Breast cancer ([place])   . Decreased appetite 10/08  . Depression   . Endometriosis 10/2004  . Epigastric pain 10/2006  . Family history of breast cancer 11/19/2019  . FH: migraines   . GBS carrier   . H/O amenorrhea 06/2006  . H/O dyspareunia 7/07  . H/O fatigue   . H/O nausea and vomiting 10/2006  . H/O rubella   . H/O varicella   . Hyperemesis arising during pregnancy    First  pregnancy  . Irregular periods/menstrual cycles 02/2005  . 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 Pelvic pain 09/2008    SURGICAL HISTORY: Past Surgical History:  Procedure Laterality Date  . AUGMENTATION MAMMAPLASTY Bilateral 2993   silicone   . BREAST IMPLANT REMOVAL Right 05/18/2020   Procedure: REMOVAL OF RIGHT BREAST IMPLANT;  Surgeon: Donnie Mesa, MD;  Location: [place];  Service: General;  Laterality: Right;  . BREAST LUMPECTOMY WITH RADIOACTIVE SEED LOCALIZATION Right 04/15/2020   Procedure: RIGHT BREAST LUMPECTOMY WITH RADIOACTIVE SEED LOCALIZATION;  Surgeon: Donnie Mesa, MD;  Location: [place];  Service: General;  Laterality: Right;  . MODIFIED MASTECTOMY Right 05/18/2020   Procedure: RIGHT MODIFIED RADICAL MASTECTOMY;  Surgeon: Donnie Mesa, MD;  Location: [place];  Service: General;  Laterality: Right;  . PORTACATH PLACEMENT Left 06/10/2020   Procedure: INSERTION PORT-A-CATH;  Surgeon: Donnie Mesa, MD;  Location: [place];  Service: General;  Laterality: Left;  45 MINUTES ROOM 2  . RADIOACTIVE SEED GUIDED AXILLARY SENTINEL LYMPH NODE Right 04/15/2020   Procedure: RADIOACTIVE SEED GUIDED RIGHT AXILLARY SENTINEL LYMPH NODE DISSECTION;  Surgeon: Donnie Mesa, MD;  Location: [place];  Service: General;  Laterality: Right;  . SENTINEL NODE BIOPSY N/A 04/15/2020   Procedure: SENTINEL NODE BIOPSY;  Surgeon: Donnie Mesa, MD;  Location: [place];  Service: General;  Laterality: N/A;  . WISDOM TOOTH EXTRACTION      I have reviewed the social history and family history with the patient and they are unchanged from previous note.  ALLERGIES:  is allergic to ibuprofen.  MEDICATIONS:  Current Outpatient Medications  Medication Sig Dispense Refill  . abemaciclib (VERZENIO) 50 MG tablet Take 1 tablet (50 mg) in the morning and 2 tablets (100 mg) in the evening. Swallow tablets whole. Do not chew, crush, or  split tablets before swallowing. (Patient not taking: No sig reported) 90 tablet 1  . anastrozole (ARIMIDEX) 1 MG tablet TAKE 1 TABLET BY MOUTH DAILY. (Patient taking differently: Take 1 mg by mouth at bedtime.) 30 tablet 3  . clindamycin (CLINDAGEL) 1 % gel Apply topically 2 (two) times daily. (Patient not taking: No sig reported) 30 g 0  . gabapentin (NEURONTIN) 100 MG capsule Take 2 capsules (200 mg total) by mouth at bedtime. 60 capsule 1  . lidocaine-prilocaine (EMLA) cream Apply to affected area once 30 g 3  . ondansetron (ZOFRAN ODT) 4 MG disintegrating tablet Take 1 tablet (4 mg total) by mouth every 8 (eight) hours as needed for nausea or vomiting. 20 tablet 0  . ondansetron (ZOFRAN) 8 MG tablet Take 1 tablet (8 mg total) by mouth 2 (two) times daily as needed. Start on the third day after chemotherapy. 30 tablet 1  . pantoprazole (PROTONIX) 40 MG tablet Take 1 tablet (40 mg total)  by mouth daily as needed. 30 tablet 5  . prochlorperazine (COMPAZINE) 10 MG tablet Take 1 tablet (10 mg total) by mouth every 6 (six) hours as needed (Nausea or vomiting). 30 tablet 1  . prochlorperazine (COMPAZINE) 5 MG tablet Take 1-2 tablets (5-10 mg total) by mouth every 6 (six) hours as needed for nausea or vomiting. (Patient not taking: Reported on 05/10/2020) 30 tablet 1  . sucralfate (CARAFATE) 1 g tablet Take 1 tablet (1 g total) by mouth daily as needed (Upset stomach). 30 tablet 1  . traMADol (ULTRAM) 50 MG tablet Take 1 tablet (50 mg total) by mouth every 6 (six) hours as needed (mild pain). 30 tablet 0  . venlafaxine (EFFEXOR) 37.5 MG tablet TAKE 1 TABLET(37.5 MG) BY MOUTH TWICE DAILY (Patient taking differently: Take 37.5 mg by mouth daily.) 30 tablet 4   No current facility-administered medications for this visit.    PHYSICAL EXAMINATION: ECOG PERFORMANCE STATUS: 2 - Symptomatic, <50% confined to bed  Vitals:   06/16/20 1628 06/16/20 1629  BP: 109/77 103/79  Pulse:    Resp:    Temp:     SpO2:     Filed Weights   06/16/20 1552  Weight: 127 lb 8 oz (57.8 kg)    GENERAL:alert, no distress and comfortable SKIN: skin color, texture, turgor are normal, no rashes or significant lesions (+) Left chest PAC, clean EYES: normal, Conjunctiva are pink and non-injected, sclera clear  NECK: supple, thyroid normal size, non-tender, without nodularity LYMPH:  no palpable lymphadenopathy in the cervical, axillary  LUNGS: clear to auscultation and percussion with normal breathing effort HEART: regular rate & rhythm and no murmurs and no lower extremity edema ABDOMEN:abdomen soft, non-tender and normal bowel sounds (+) mild epigastric tenderness  Musculoskeletal:no cyanosis of digits and no clubbing  NEURO: alert & oriented x 3 with fluent speech, no focal motor/sensory deficits  LABORATORY DATA:  I have reviewed the data as listed CBC Latest Ref Rng & Units 06/16/2020 05/27/2020 05/20/2020  WBC 4.0 - 10.5 K/uL 6.7 6.8 7.9  Hemoglobin 12.0 - 15.0 g/dL 12.1 12.0 9.1(L)  Hematocrit 36.0 - 46.0 % 35.5(L) 36.6 28.2(L)  Platelets 150 - 400 K/uL 217 284 224     CMP Latest Ref Rng & Units 05/27/2020 05/20/2020 05/19/2020  Glucose 70 - 99 mg/dL 92 98 126(H)  BUN 6 - 20 mg/dL _0 Creatinine 0.44 - 1.00 mg/dL 0.73 0.72 0.66  Sodium 135 - 145 mmol/L 142 139 138  Potassium 3.5 - 5.1 mmol/L 4.4 3.9 4.3  Chloride 98 - 111 mmol/L 104 105 106  CO2 22 - 32 mmol/L _1 Calcium 8.9 - 10.3 mg/dL 9.8 8.3(L) 9.1  Total Protein 6.5 - 8.1 g/dL 7.7 - -  Total Bilirubin 0.3 - 1.2 mg/dL 0.4 - -  Alkaline Phos 38 - 126 U/L 97 - -  AST 15 - 41 U/L 49(H) - -  ALT 0 - 44 U/L 136(H) - -      RADIOGRAPHIC STUDIES: I have personally reviewed the radiological images as listed and agreed with the findings in the report. No results found.   ASSESSMENT & PLAN:  Sheilyn Boehlke is a 35 y.o. female with   1. Shortness of breath and chest tightness  -She notes she had PAC placed 5/26 and Echo on 5/27  which was unremarkable. Since Echo she has chest tightness and SOB or episodes where she stops breathing when she sleeps on her right side, pain  with deep breath. She has lightheadedness with standing and overall very weak with any activity or standing. For the first 2 days after her Echo she had mild trouble swallowing but resolved.  -Her physical exam was unremarkable today, not orthostatics, or hypoxia on 6 minutes walking (pulse ox remained to be 100%).  I ordered a D-dimer today.  Suspicion for PE is low. -Possible related to stress and anxiety, will increase Effexor to 75 mg daily -For follow-up clinic  2. 1.Malignant neoplasm of upper-outer quadrant of right breast, StageIIA,p(T2N2aM0), ER+/PR+/HER2-, Levester Fresh, Mammaprintluminal type A, low risk -She had neoadjuvant endocrine therapy, right mastectomy and lymph node dissection -Plan to start adjuvant chemotherapy AC next week -Initial PET scan was negative for distant metastasis in November 2021 -Given her recent dyspnea, and her transaminitis, will obtain restaging PET scan next week to rule out metastatic disease.  PLAN: -Lab today  -PET scan next week -she is scheduled to start chemo on 6/9, may postpone if we can not get PET done before first cycle chemo  -continue anastrozole (will stop when she starts chemo) and monthly Zoladex injection, next due 6/9     No problem-specific Assessment & Plan notes found for this encounter.   Orders Placed This Encounter  Procedures  . NM PET Image Restag (PS) Skull Base To Thigh    Standing Status:   Future    Standing Expiration Date:   06/16/2021    Order Specific Question:   If indicated for the ordered procedure, I authorize the administration of a radiopharmaceutical per Radiology protocol    Answer:   Yes    Order Specific Question:   Is the patient pregnant?    Answer:   No    Order Specific Question:   Preferred imaging location?    Answer:   [facility]  . D-dimer,  quantitative    Standing Status:   Future    Number of Occurrences:   1    Standing Expiration Date:   06/16/2021   All questions were answered. The patient knows to call the clinic with any problems, questions or concerns. No barriers to learning was detected. The total time spent in the appointment was 30 minutes.     Truitt Merle, MD 06/16/2020   I, Joslyn Devon, am acting as scribe for Truitt Merle, MD.   I have reviewed the above documentation for accuracy and completeness, and I agree with the above.

## 2020-06-17 ENCOUNTER — Encounter: Payer: Self-pay | Admitting: Hematology

## 2020-06-17 ENCOUNTER — Ambulatory Visit: Payer: 59 | Admitting: Physical Therapy

## 2020-06-17 ENCOUNTER — Emergency Department (HOSPITAL_COMMUNITY)
Admission: EM | Admit: 2020-06-17 | Discharge: 2020-06-17 | Disposition: A | Discharge status: Home / Self Care | Payer: 59 | Attending: Emergency Medicine | Admitting: Emergency Medicine

## 2020-06-17 ENCOUNTER — Telehealth: Payer: Self-pay | Admitting: Hematology

## 2020-06-17 ENCOUNTER — Other Ambulatory Visit: Payer: Self-pay

## 2020-06-17 ENCOUNTER — Emergency Department (HOSPITAL_COMMUNITY): Payer: 59

## 2020-06-17 ENCOUNTER — Encounter (HOSPITAL_COMMUNITY): Payer: Self-pay | Admitting: Emergency Medicine

## 2020-06-17 ENCOUNTER — Encounter: Payer: Self-pay | Admitting: Physical Therapy

## 2020-06-17 DIAGNOSIS — Z170 Estrogen receptor positive status [ER+]: Secondary | ICD-10-CM | POA: Insufficient documentation

## 2020-06-17 DIAGNOSIS — M25611 Stiffness of right shoulder, not elsewhere classified: Secondary | ICD-10-CM | POA: Insufficient documentation

## 2020-06-17 DIAGNOSIS — R059 Cough, unspecified: Secondary | ICD-10-CM | POA: Diagnosis not present

## 2020-06-17 DIAGNOSIS — R0602 Shortness of breath: Secondary | ICD-10-CM | POA: Insufficient documentation

## 2020-06-17 DIAGNOSIS — R791 Abnormal coagulation profile: Secondary | ICD-10-CM | POA: Insufficient documentation

## 2020-06-17 DIAGNOSIS — Z853 Personal history of malignant neoplasm of breast: Secondary | ICD-10-CM | POA: Diagnosis not present

## 2020-06-17 DIAGNOSIS — R071 Chest pain on breathing: Secondary | ICD-10-CM | POA: Insufficient documentation

## 2020-06-17 DIAGNOSIS — Z483 Aftercare following surgery for neoplasm: Secondary | ICD-10-CM

## 2020-06-17 DIAGNOSIS — R7989 Other specified abnormal findings of blood chemistry: Secondary | ICD-10-CM

## 2020-06-17 DIAGNOSIS — C50411 Malignant neoplasm of upper-outer quadrant of right female breast: Secondary | ICD-10-CM | POA: Insufficient documentation

## 2020-06-17 DIAGNOSIS — R293 Abnormal posture: Secondary | ICD-10-CM | POA: Insufficient documentation

## 2020-06-17 DIAGNOSIS — R0781 Pleurodynia: Secondary | ICD-10-CM

## 2020-06-17 LAB — CBC WITH DIFFERENTIAL/PLATELET
Abs Immature Granulocytes: 0.03 10*3/uL (ref 0.00–0.07)
Basophils Absolute: 0 10*3/uL (ref 0.0–0.1)
Basophils Relative: 1 %
Eosinophils Absolute: 0.2 10*3/uL (ref 0.0–0.5)
Eosinophils Relative: 2 %
HCT: 37.5 % (ref 36.0–46.0)
Hemoglobin: 12.4 g/dL (ref 12.0–15.0)
Immature Granulocytes: 1 %
Lymphocytes Relative: 39 %
Lymphs Abs: 2.4 10*3/uL (ref 0.7–4.0)
MCH: 32 pg (ref 26.0–34.0)
MCHC: 33.1 g/dL (ref 30.0–36.0)
MCV: 96.6 fL (ref 80.0–100.0)
Monocytes Absolute: 0.6 10*3/uL (ref 0.1–1.0)
Monocytes Relative: 9 %
Neutro Abs: 3 10*3/uL (ref 1.7–7.7)
Neutrophils Relative %: 48 %
Platelets: 231 10*3/uL (ref 150–400)
RBC: 3.88 MIL/uL (ref 3.87–5.11)
RDW: 11.7 % (ref 11.5–15.5)
WBC: 6.2 10*3/uL (ref 4.0–10.5)
nRBC: 0 % (ref 0.0–0.2)

## 2020-06-17 LAB — BASIC METABOLIC PANEL
Anion gap: 8 (ref 5–15)
BUN: 11 mg/dL (ref 6–20)
CO2: 27 mmol/L (ref 22–32)
Calcium: 9.9 mg/dL (ref 8.9–10.3)
Chloride: 105 mmol/L (ref 98–111)
Creatinine, Ser: 0.58 mg/dL (ref 0.44–1.00)
GFR, Estimated: >60 mL/min (ref >60)
Glucose, Bld: 101 mg/dL — ABNORMAL HIGH (ref 70–99)
Potassium: 4.2 mmol/L (ref 3.5–5.1)
Sodium: 140 mmol/L (ref 135–145)

## 2020-06-17 LAB — I-STAT BETA HCG BLOOD, ED (MC, WL, AP ONLY): I-stat hCG, quantitative: <5 m[IU]/mL (ref <5)

## 2020-06-17 IMAGING — CT CT ANGIO CHEST
3 of 7 series · 18 of 36 positions shown · IV contrast (omnipaque)
Comparison: Chest radiograph [DATE]

CLINICAL DATA: Shortness of breath and elevated D-dimer. History of
breast carcinoma

EXAM:
CT ANGIOGRAPHY CHEST WITH CONTRAST
TECHNIQUE: Multidetector CT imaging of the chest was performed using the
standard protocol during bolus administration of intravenous
contrast. Multiplanar CT image reconstructions and MIPs were
obtained to evaluate the vascular anatomy.
CONTRAST:  38mL OMNIPAQUE IOHEXOL 350 MG/ML SOLN

[Series 5: thins · axial · 0.65mm/px · z∈[+1526,+1757]mm · 12 of 274 slices shown]
[im 22/274  lung]
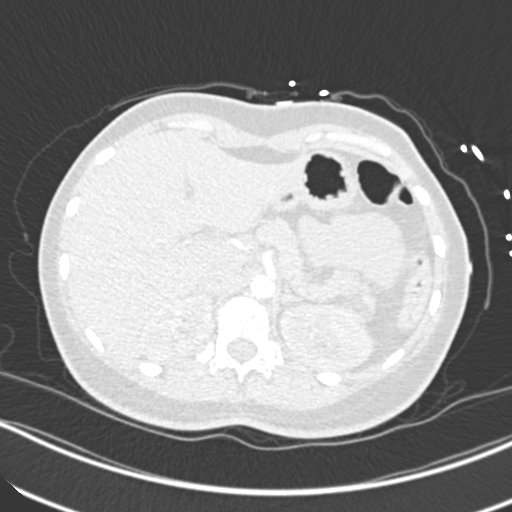
[im 43/274  mediastinal]
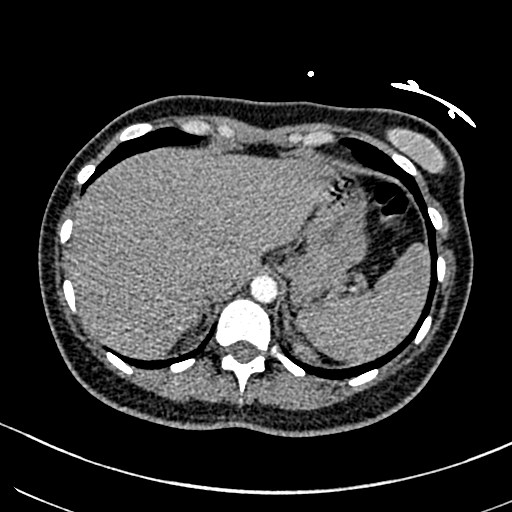
[im 64/274  lung]
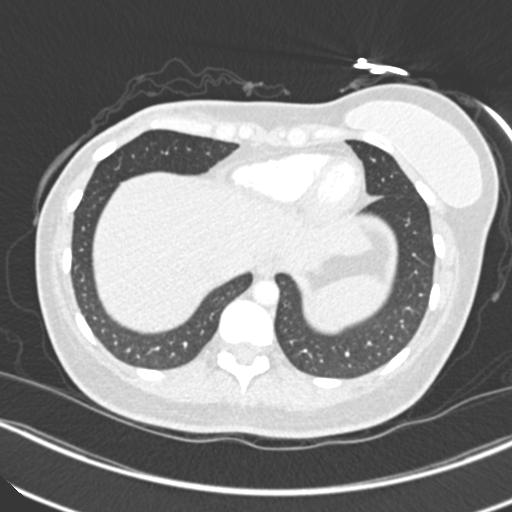
[im 85/274  mediastinal]
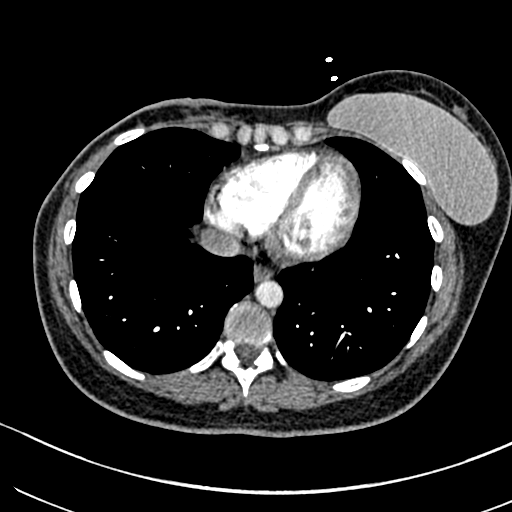
[im 106/274  lung]
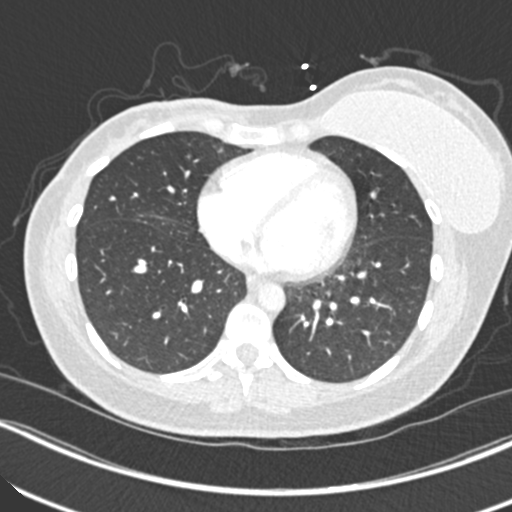
[im 127/274  mediastinal]
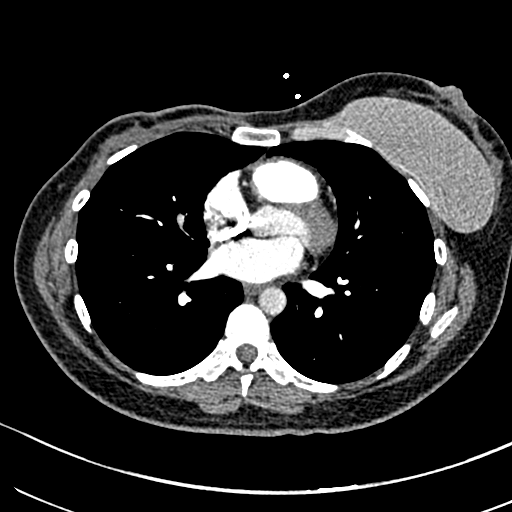
[im 148/274  lung]
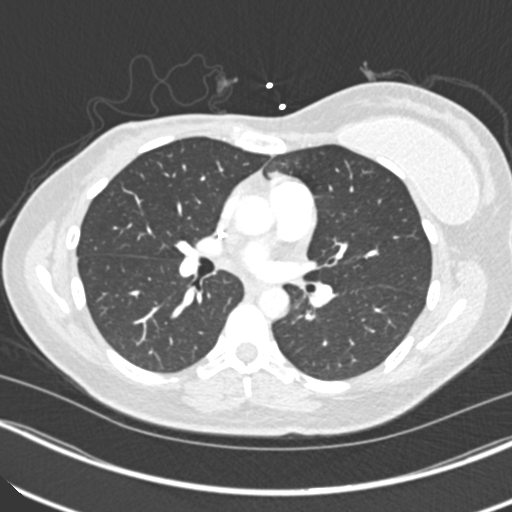
[im 169/274  mediastinal]
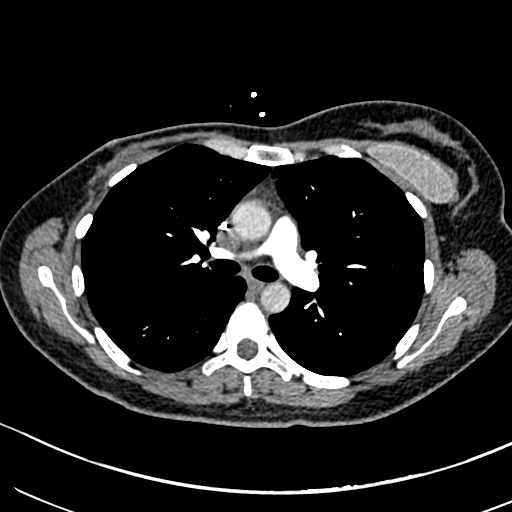
[im 190/274  lung]
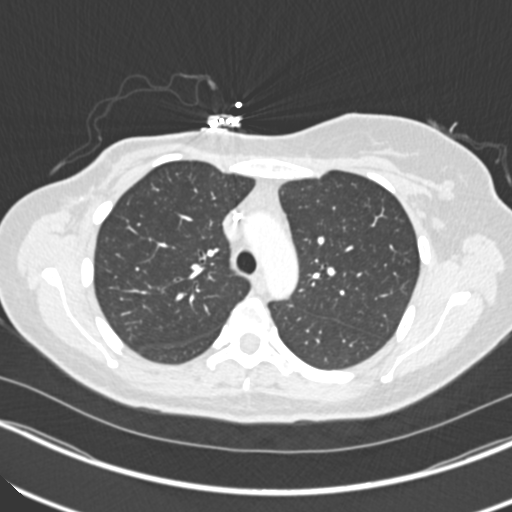
[im 211/274  mediastinal]
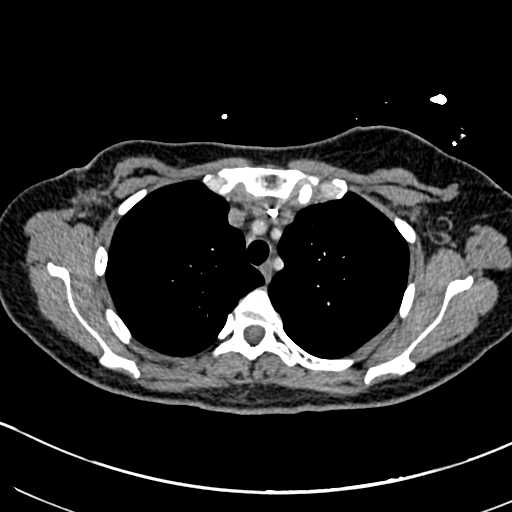
[im 232/274  lung]
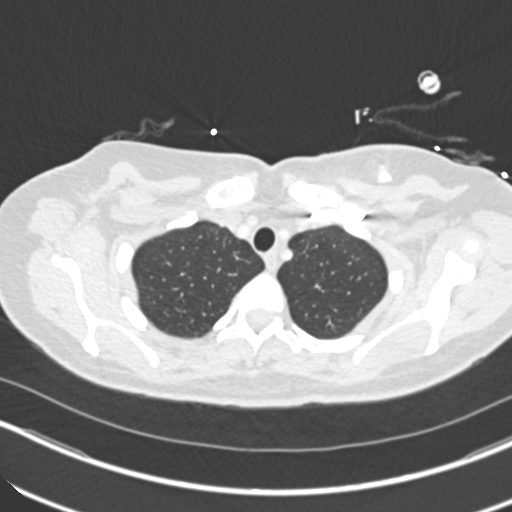
[im 253/274  mediastinal]
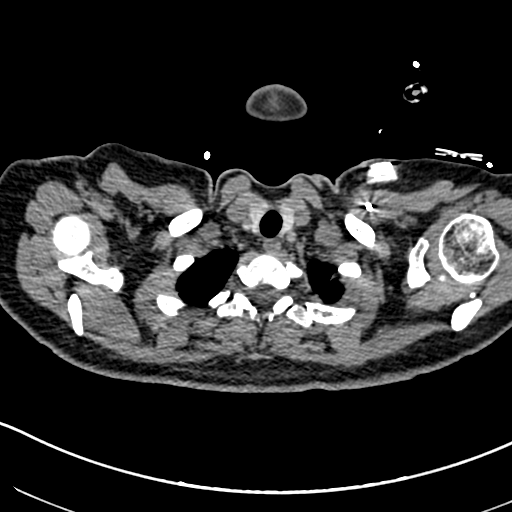

[Series 6: coronal mpr · coronal · 0.64mm/px · 1 of 108 slices shown]
[im 54/108  mediastinal]
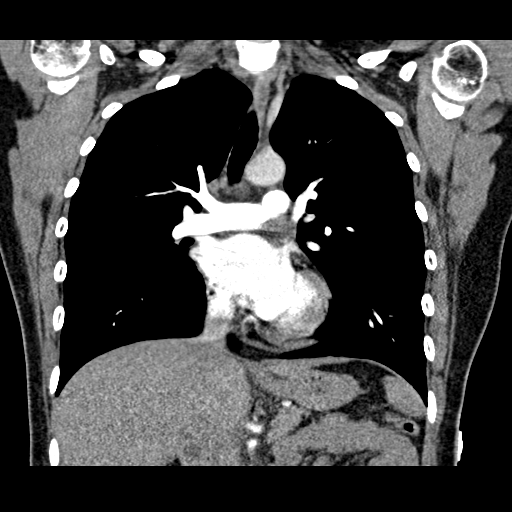

[Series 10: lung · axial · 0.65mm/px · z∈[+1550,+1726]mm · 5 of 134 slices shown]
[im 23/134  mediastinal]
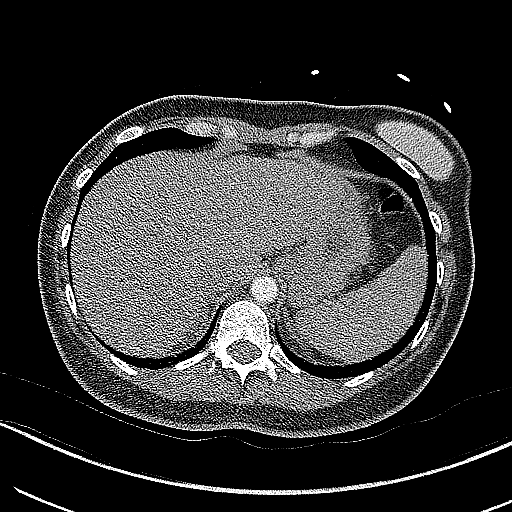
[im 45/134  mediastinal]
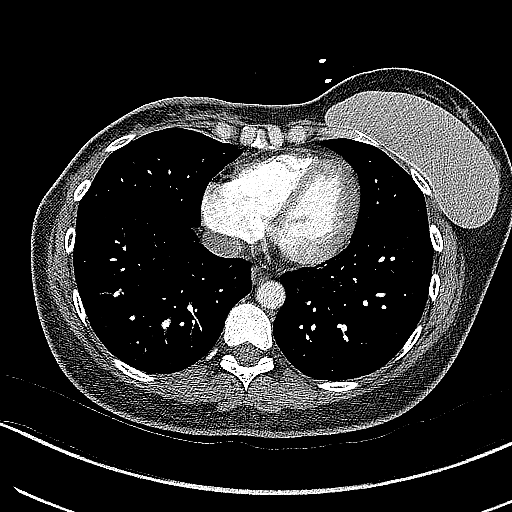
[im 67/134  mediastinal]
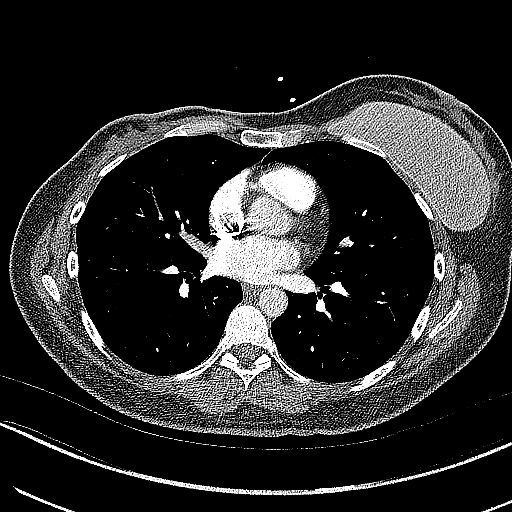
[im 89/134  mediastinal]
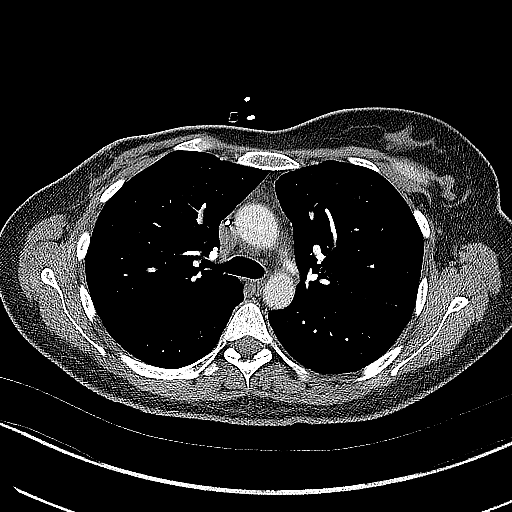
[im 111/134  mediastinal]
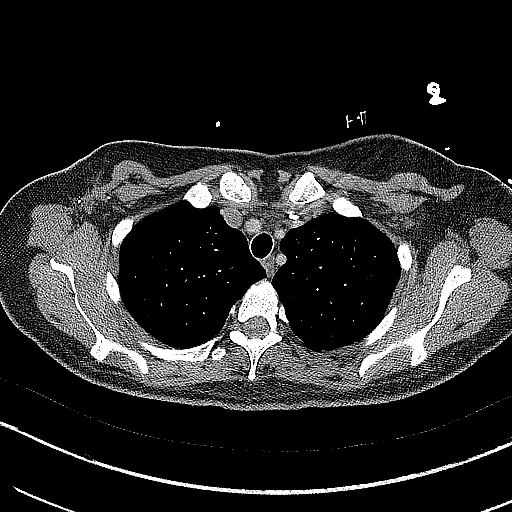

[18 of 36 positions shown; findings below may reference images not displayed]

FINDINGS: Cardiovascular: There is no demonstrable pulmonary embolus. No
thoracic aortic aneurysm or dissection. Visualized great vessels
appear normal. Port-A-Cath tip is in the superior vena cava. No
pericardial effusion or pericardial thickening.

Mediastinum/Nodes: Visualized thyroid appears normal. There is no
appreciable thoracic adenopathy. No esophageal lesions are evident.

Lungs/Pleura: Lungs are clear. No pleural effusions. Trachea and
major bronchial structures appear patent. No pneumothorax.

Upper Abdomen: Visualized upper abdominal structures appear
unremarkable.

Musculoskeletal: Breast implant noted on the left. No blastic or
lytic bone lesions. No chest wall lesions.

Review of the MIP images confirms the above findings.
IMPRESSION: 1. No demonstrable pulmonary embolus. No thoracic aortic aneurysm or
dissection.

2.  Lungs clear.

3.  No evident adenopathy.

4.  Port-A-Cath tip in superior vena cava.

5.  Status post left mastectomy with implant on the left.

## 2020-06-17 MED ORDER — IOHEXOL 350 MG/ML SOLN
100.0000 mL | Freq: Once | INTRAVENOUS | Status: AC | PRN
  Administered 2020-06-17: 38 mL via INTRAVENOUS

## 2020-06-17 MED ORDER — HEPARIN SOD (PORK) LOCK FLUSH 100 UNIT/ML IV SOLN
500.0000 [IU] | Freq: Once | INTRAVENOUS | Status: AC
  Administered 2020-06-17: 500 [IU]
  Filled 2020-06-17: qty 5

## 2020-06-17 NOTE — ED Provider Notes (Signed)
[facility] DEPT Provider Note   CSN: [id number] Arrival date & time: 06/17/20  1152     History Chief Complaint  Patient presents with  . Shortness of Breath    Caitlyn Miller is a 35 y.o. female.  HPI    35 year old female comes in a chief complaint of shortness of breath.  Patient has history of breast cancer, status postmastectomy last month.  She reports that she has been having some pleuritic chest pain and shortness of breath.  Pain is generalized.  She has some cough as well but no hemoptysis, no fevers or chills.  She starts chemo next week, has a Port-A-Cath placed about 2 weeks ago.  She had complained about the pain to her cancer doctor, they ordered D-dimer which was elevated.  Patient was advised to come to the ER for CT scan.  Past Medical History:  Diagnosis Date  . Abnormal Pap smear   . Acid reflux   . Anemia   . Anxiety   . Breast cancer ([place])   . Decreased appetite 10/08  . Depression   . Endometriosis 10/2004  . Epigastric pain 10/2006  . Family history of breast cancer 11/19/2019  . FH: migraines   . GBS carrier   . H/O amenorrhea 06/2006  . H/O dyspareunia 7/07  . H/O fatigue   . H/O nausea and vomiting 10/2006  . H/O rubella   . H/O varicella   . Hyperemesis arising during pregnancy    First pregnancy  . Irregular periods/menstrual cycles 02/2005  . 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 Pelvic pain 09/2008    Patient Active Problem List   Diagnosis Date Noted  . Invasive ductal carcinoma of breast, female, right ([place]) 05/18/2020  . Menopausal syndrome (hot flushes) 12/23/2019  . 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 Genetic testing 11/26/2019  . Family history of breast cancer 11/19/2019  . Malignant neoplasm of upper-outer quadrant of right breast in female, estrogen receptor positive ([place]) 11/17/2019  . S/P breast augmentation 09/18/2019  . Hx of migraines 06/20/2011  . GERD  (gastroesophageal reflux disease) 06/20/2011  . Anemia 06/20/2011    Past Surgical History:  Procedure Laterality Date  . AUGMENTATION MAMMAPLASTY Bilateral 8182   silicone   . BREAST IMPLANT REMOVAL Right 05/18/2020   Procedure: REMOVAL OF RIGHT BREAST IMPLANT;  Surgeon: Donnie Mesa, MD;  Location: [facility];  Service: General;  Laterality: Right;  . BREAST LUMPECTOMY WITH RADIOACTIVE SEED LOCALIZATION Right 04/15/2020   Procedure: RIGHT BREAST LUMPECTOMY WITH RADIOACTIVE SEED LOCALIZATION;  Surgeon: Donnie Mesa, MD;  Location: [facility];  Service: General;  Laterality: Right;  . MODIFIED MASTECTOMY Right 05/18/2020   Procedure: RIGHT MODIFIED RADICAL MASTECTOMY;  Surgeon: Donnie Mesa, MD;  Location: [facility];  Service: General;  Laterality: Right;  . PORTACATH PLACEMENT Left 06/10/2020   Procedure: INSERTION PORT-A-CATH;  Surgeon: Donnie Mesa, MD;  Location: [facility];  Service: General;  Laterality: Left;  45 MINUTES ROOM 2  . RADIOACTIVE SEED GUIDED AXILLARY SENTINEL LYMPH NODE Right 04/15/2020   Procedure: RADIOACTIVE SEED GUIDED RIGHT AXILLARY SENTINEL LYMPH NODE DISSECTION;  Surgeon: Donnie Mesa, MD;  Location: [facility];  Service: General;  Laterality: Right;  . SENTINEL NODE BIOPSY N/A 04/15/2020   Procedure: SENTINEL NODE BIOPSY;  Surgeon: Donnie Mesa, MD;  Location: [facility];  Service: General;  Laterality: N/A;  . WISDOM TOOTH EXTRACTION       OB  History    Gravida  2   Para  2   Term  2   Preterm      AB      Living  2     SAB      IAB      Ectopic      Multiple      Live Births  2           Family History  Problem Relation Age of Onset  . Migraines Mother   . Hypertension Father   . Breast cancer Paternal Aunt 5  . Colon cancer Neg Hx   . Stomach cancer Neg Hx     Social History   Tobacco Use  . Smoking status: Never Smoker  . Smokeless tobacco: Never Used  Vaping Use   . Vaping Use: Never used  Substance Use Topics  . Alcohol use: No  . Drug use: No    Home Medications Prior to Admission medications   Medication Sig Start Date End Date Taking? Authorizing Provider  acetaminophen (TYLENOL) 500 MG tablet Take 500 mg by mouth every 6 (six) hours as needed for moderate pain.   Yes [provider]  anastrozole (ARIMIDEX) 1 MG tablet TAKE 1 TABLET BY MOUTH DAILY. Patient taking differently: Take 1 mg by mouth at bedtime. 02/05/20 02/04/21 Yes Alla Feeling, NP  gabapentin (NEURONTIN) 100 MG capsule Take 2 capsules (200 mg total) by mouth at bedtime. 03/03/20  Yes Truitt Merle, MD  ondansetron (ZOFRAN) 8 MG tablet Take 1 tablet (8 mg total) by mouth 2 (two) times daily as needed. Start on the third day after chemotherapy. Patient taking differently: Take 8 mg by mouth 2 (two) times daily as needed for vomiting or nausea. Start on the third day after chemotherapy. 05/27/20  Yes Truitt Merle, MD  pantoprazole (PROTONIX) 40 MG tablet Take 1 tablet (40 mg total) by mouth daily as needed. Patient taking differently: Take 40 mg by mouth daily as needed (heartburn). 09/18/19  Yes [name], Lilia Argue, MD  prochlorperazine (COMPAZINE) 10 MG tablet Take 10 mg by mouth every 6 (six) hours as needed for nausea or vomiting.   Yes [provider]  prochlorperazine (COMPAZINE) 5 MG tablet Take 1-2 tablets (5-10 mg total) by mouth every 6 (six) hours as needed for nausea or vomiting. 03/25/20  Yes Truitt Merle, MD  sucralfate (CARAFATE) 1 g tablet Take 1 tablet (1 g total) by mouth daily as needed (Upset stomach). 06/04/20  Yes Truitt Merle, MD  venlafaxine (EFFEXOR) 37.5 MG tablet TAKE 1 TABLET(37.5 MG) BY MOUTH TWICE DAILY Patient taking differently: Take 37.5 mg by mouth daily. 04/05/20  Yes Truitt Merle, MD  abemaciclib (VERZENIO) 50 MG tablet Take 1 tablet (50 mg) in the morning and 2 tablets (100 mg) in the evening. Swallow tablets whole. Do not chew, crush, or split tablets  before swallowing. Patient not taking: No sig reported 03/03/20   Truitt Merle, MD  clindamycin (CLINDAGEL) 1 % gel Apply topically 2 (two) times daily. Patient not taking: No sig reported 12/22/19   Truitt Merle, MD  lidocaine-prilocaine (EMLA) cream Apply to affected area once Patient taking differently: Apply 1 application topically daily as needed (port access). 05/27/20   Truitt Merle, MD  ondansetron (ZOFRAN ODT) 4 MG disintegrating tablet Take 1 tablet (4 mg total) by mouth every 8 (eight) hours as needed for nausea or vomiting. Patient not taking: No sig reported 05/20/20   Donnie Mesa, MD  prochlorperazine (COMPAZINE) 10 MG tablet Take 1 tablet (10 mg total) by mouth every 6 (six) hours as needed (Nausea or vomiting). Patient not taking: No sig reported 05/27/20   Truitt Merle, MD  traMADol (ULTRAM) 50 MG tablet Take 1 tablet (50 mg total) by mouth every 6 (six) hours as needed (mild pain). Patient not taking: No sig reported 05/20/20   Donnie Mesa, MD    Allergies    Ibuprofen  Review of Systems   Review of Systems  Constitutional: Positive for activity change.  Respiratory: Positive for shortness of breath.   Cardiovascular: Positive for chest pain.  Allergic/Immunologic: Negative for immunocompromised state.  Hematological: Does not bruise/bleed easily.  All other systems reviewed and are negative.   Physical Exam Updated Vital Signs BP 105/70   Pulse 63   Temp 98.2 F (36.8 C)   Resp 17   SpO2 98%   Physical Exam Vitals and nursing note reviewed.  Constitutional:      Appearance: She is well-developed.  HENT:     Head: Normocephalic and atraumatic.  Cardiovascular:     Rate and Rhythm: Normal rate.  Pulmonary:     Effort: Pulmonary effort is normal.     Breath sounds: No decreased breath sounds or wheezing.  Abdominal:     General: Bowel sounds are normal.  Musculoskeletal:     Cervical back: Normal range of motion and neck supple.  Skin:    General: Skin is warm  and dry.     Comments: Patient has no chest wall erythema around the port site.  Neurological:     Mental Status: She is alert and oriented to person, place, and time.     ED Results / Procedures / Treatments   Labs (all labs ordered are listed, but only abnormal results are displayed) Labs Reviewed  BASIC METABOLIC PANEL - Abnormal; Notable for the following components:      Result Value   Glucose, Bld 101 (*)    All other components within normal limits  CBC WITH DIFFERENTIAL/PLATELET  I-STAT BETA HCG BLOOD, ED ([facility], [facility], [facility] ONLY)    EKG EKG Interpretation  Date/Time:  Thursday June 17 2020 12:30:15 EDT Ventricular Rate:  71 PR Interval:  126 QRS Duration: 87 QT Interval:  366 QTC Calculation: 398 R Axis:   74 Text Interpretation: Sinus rhythm No acute changes No significant change since last tracing Confirmed by Varney Biles [phone]) on 06/17/2020 3:24:55 PM   Radiology CT Angio Chest PE W and/or Wo Contrast  Result Date: 06/17/2020 CLINICAL DATA:  Shortness of breath and elevated D-dimer. History of breast carcinoma EXAM: CT ANGIOGRAPHY CHEST WITH CONTRAST TECHNIQUE: Multidetector CT imaging of the chest was performed using the standard protocol during bolus administration of intravenous contrast. Multiplanar CT image reconstructions and MIPs were obtained to evaluate the vascular anatomy. CONTRAST:  69m OMNIPAQUE IOHEXOL 350 MG/ML SOLN COMPARISON:  Chest radiograph Jun 10, 2020 FINDINGS: Cardiovascular: There is no demonstrable pulmonary embolus. No thoracic aortic aneurysm or dissection. Visualized great vessels appear normal. Port-A-Cath tip is in the superior vena cava. No pericardial effusion or pericardial thickening. Mediastinum/Nodes: Visualized thyroid appears normal. There is no appreciable thoracic adenopathy. No esophageal lesions are evident. Lungs/Pleura: Lungs are clear. No pleural effusions. Trachea and major bronchial structures appear patent. No pneumothorax.  Upper Abdomen: Visualized upper abdominal structures appear unremarkable. Musculoskeletal: Breast implant noted on the left. No blastic or lytic bone lesions. No chest wall lesions. Review of the MIP images confirms the  above findings. IMPRESSION: 1. No demonstrable pulmonary embolus. No thoracic aortic aneurysm or dissection. 2.  Lungs clear. 3.  No evident adenopathy. 4.  Port-A-Cath tip in superior vena cava. 5.  Status post left mastectomy with implant on the left. Electronically Signed   By: Lowella Grip III M.D.   On: 06/17/2020 14:33    Procedures Procedures   Medications Ordered in ED Medications  heparin lock flush 100 unit/mL (has no administration in time range)  iohexol (OMNIPAQUE) 350 MG/ML injection 100 mL (38 mLs Intravenous Contrast Given 06/17/20 1414)    ED Course  I have reviewed the triage vital signs and the nursing notes.  Pertinent labs & imaging results that were available during my care of the patient were reviewed by me and considered in my medical decision making (see chart for details).    MDM Rules/Calculators/A&P                          Pt comes in with cc of elevated dimer, pleuritic chest pain.  She is status post mastectomy from earlier this month and also has a Port-A-Cath in place.  Concerns for DVT given elevated dimer in the setting of pleuritic chest pain.  CT angiogram was ordered and it is negative for PE.  Stable for discharge.  Suspect that the pain could be a result of the Port-A-Cath placement and irritation from it or postop from mastectomy, with some muscle injury.  Final Clinical Impression(s) / ED Diagnoses Final diagnoses:  Positive D dimer  Pleuritic chest pain    Rx / DC Orders ED Discharge Orders    None       Varney Biles, MD 06/17/20 1533

## 2020-06-17 NOTE — ED Provider Notes (Signed)
Emergency Medicine Provider Triage Evaluation Note  Aubriauna Riner , a 35 y.o. female  was evaluated in triage.  Pt complains of sob.  Review of Systems  Positive: Sob, fatigue, elevated d-dimer Negative: Fever, productive cough  Physical Exam  BP 107/79 (BP Location: Left Arm)   Pulse 83   Temp 98.2 F (36.8 C)   Resp 17   SpO2 99%  Gen:   Awake, no distress   Resp:  Normal effort  MSK:   Moves extremities without difficulty  Other:    Medical Decision Making  Medically screening exam initiated at 12:29 PM.  Appropriate orders placed.  Lasonja Lakins was informed that the remainder of the evaluation will be completed by another provider, this initial triage assessment does not replace that evaluation, and the importance of remaining in the ED until their evaluation is complete.  Hx of breast CA s/p mastectomy here with sob and recent positive d-dimer done by oncologist.  Need to r/o PE.     Domenic Moras, PA-C 06/17/20 [place], [place], MD 06/18/20 [phone]

## 2020-06-17 NOTE — Telephone Encounter (Signed)
I saw pt yesterday in my office for her chest tightness and dyspnea and did lab.  Her D-dimer came back positive (0.99).  I called patient, she was with our physical therapist Inez Catalina, no worsening shortness of breath or other new symptoms since yesterday.  Given her recent surgery, history of breast cancer, and her symptoms, I think we need to rule out PE, although my clinical suspicion is not very high.  I recommended her to go to emergency room for work-up, she agrees and will go to [facility] ED. I informed [facility] ED.   Truitt Merle  06/17/2020

## 2020-06-17 NOTE — ED Triage Notes (Signed)
Per pt, states SOB, lung and back pain-symptoms for 4 days-occasionally feels faint

## 2020-06-17 NOTE — Progress Notes (Signed)
Pharmacist Chemotherapy Monitoring - Initial Assessment    Anticipated start date: 06/24/20  Regimen:  . Are orders appropriate based on the patient's diagnosis, regimen, and cycle? Yes . Does the plan date match the patient's scheduled date? Yes . Is the sequencing of drugs appropriate? Yes . Are the premedications appropriate for the patient's regimen? Yes . Prior Authorization for treatment is: Approved o If applicable, is the correct biosimilar selected based on the patient's insurance? yes  Organ Function and Labs: Marland Kitchen Are dose adjustments needed based on the patient's renal function, hepatic function, or hematologic function? No . Are appropriate labs ordered prior to the start of patient's treatment? Yes . Other organ system assessment, if indicated: anthracyclines: Echo/ MUGA . The following baseline labs, if indicated, have been ordered: N/A  Dose Assessment: . Are the drug doses appropriate? Yes . Are the following correct: o Drug concentrations Yes o IV fluid compatible with drug Yes o Administration routes Yes o Timing of therapy Yes . If applicable, does the patient have documented access for treatment and/or plans for port-a-cath placement? yes . If applicable, have lifetime cumulative doses been properly documented and assessed? yes Lifetime Dose Tracking  No doses have been documented on this patient for the following tracked chemicals: Doxorubicin, Epirubicin, Idarubicin, Daunorubicin, Mitoxantrone, Bleomycin, Oxaliplatin, Carboplatin, Liposomal Doxorubicin  o   Toxicity Monitoring/Prevention: . The patient has the following take home antiemetics prescribed: Prochlorperazine . The patient has the following take home medications prescribed: N/A . Medication allergies and previous infusion related reactions, if applicable, have been reviewed and addressed. Yes . The patient's current medication list has been assessed for drug-drug interactions with their chemotherapy  regimen. no significant drug-drug interactions were identified on review.  Order Review: . Are the treatment plan orders signed? No . Is the patient scheduled to see a provider prior to their treatment? Yes  I verify that I have reviewed each item in the above checklist and answered each question accordingly.   Kennith Center, Pharm.D., CPP 06/17/2020@1 :23 PM

## 2020-06-17 NOTE — Discharge Instructions (Addendum)
You are seen in the ER for chest discomfort and abnormal labs.  We did get a CT scan of your lungs, it does not show any evidence of blood clots, tumors in your chest, infection.  For now, we recommend taking Tylenol or ibuprofen for pain control. Follow-up with your oncology service if the symptoms continue.

## 2020-06-17 NOTE — ED Notes (Signed)
Per Dr Jacqulyn Cane office-patient was seen yesterday at cancer center, labs done-states d-dimer was positive-sending to ED for work up

## 2020-06-17 NOTE — Therapy (Signed)
[place], [place], [address] Phone: [phone]   Fax:  [phone]  Physical Therapy Evaluation  Patient Details  Name: Caitlyn Miller MRN: [id number] Date of Birth: 02/08/1985 Referring Provider (PT): Dr. Rodman Key Tsuei/Feng   Encounter Date: 06/17/2020    Past Medical History:  Diagnosis Date  . Abnormal Pap smear   . Acid reflux   . Anemia   . Anxiety   . Breast cancer ([place])   . Decreased appetite 10/08  . Depression   . Endometriosis 10/2004  . Epigastric pain 10/2006  . Family history of breast cancer 11/19/2019  . FH: migraines   . GBS carrier   . H/O amenorrhea 06/2006  . H/O dyspareunia 7/07  . H/O fatigue   . H/O nausea and vomiting 10/2006  . H/O rubella   . H/O varicella   . Hyperemesis arising during pregnancy    First pregnancy  . Irregular periods/menstrual cycles 02/2005  . Monoallelic mutation of [id number] gene 12/19/2019  . Pelvic pain 09/2008    Past Surgical History:  Procedure Laterality Date  . AUGMENTATION MAMMAPLASTY Bilateral 0881   silicone   . BREAST IMPLANT REMOVAL Right 05/18/2020   Procedure: REMOVAL OF RIGHT BREAST IMPLANT;  Surgeon: Donnie Mesa, MD;  Location: [facility];  Service: General;  Laterality: Right;  . BREAST LUMPECTOMY WITH RADIOACTIVE SEED LOCALIZATION Right 04/15/2020   Procedure: RIGHT BREAST LUMPECTOMY WITH RADIOACTIVE SEED LOCALIZATION;  Surgeon: Donnie Mesa, MD;  Location: [facility];  Service: General;  Laterality: Right;  . MODIFIED MASTECTOMY Right 05/18/2020   Procedure: RIGHT MODIFIED RADICAL MASTECTOMY;  Surgeon: Donnie Mesa, MD;  Location: [facility];  Service: General;  Laterality: Right;  . PORTACATH PLACEMENT Left 06/10/2020   Procedure: INSERTION PORT-A-CATH;  Surgeon: Donnie Mesa, MD;  Location: [facility];  Service: General;  Laterality: Left;  45 MINUTES ROOM 2  . RADIOACTIVE SEED GUIDED AXILLARY SENTINEL  LYMPH NODE Right 04/15/2020   Procedure: RADIOACTIVE SEED GUIDED RIGHT AXILLARY SENTINEL LYMPH NODE DISSECTION;  Surgeon: Donnie Mesa, MD;  Location: [facility];  Service: General;  Laterality: Right;  . SENTINEL NODE BIOPSY N/A 04/15/2020   Procedure: SENTINEL NODE BIOPSY;  Surgeon: Donnie Mesa, MD;  Location: [facility];  Service: General;  Laterality: N/A;  . WISDOM TOOTH EXTRACTION      There were no vitals filed for this visit.    Subjective Assessment - 06/17/20 1107    Subjective Patient underwent neoadjuvant hormonal therapy 01/11/2021. She underwent a right lumpectomy and sentinel node biopsy (2 of 3 were positive) Then underwent a right mastectomy and axillary node dissection (2/15 positive). She is scheduled to begin chemotherapy 06/24/2020. She has been short of breath and fatigued since her echocardiogram on 06/11/2020. She had a d-Dimer test done yesterday and it was positive, indicating possible blood clot. Contacted nurse navigator and she had Dr. Burr Medico call the PT who recommended pt go to the ED for full work up to determine cause of her positive test. Dr. Burr Medico also spoke to the pt and we will hold PT until she gets results.                          Objective measurements completed on examination: See above findings.                    PT Long Term Goals - 05/13/20 1031  PT LONG TERM GOAL #1   Title Patient will demonstrate she has regained near  full shoulder ROM and function post operatively compared to baselines.    Baseline End ROM improved but still limited by cording- 05/13/20    Status On-going      PT LONG TERM GOAL #2   Title Pt will have decreased pain by atleast 50%    Baseline Pt reports no pain at this time as motion improves-05/13/20    Status Achieved      PT LONG TERM GOAL #3   Title Pt will have quick dash no greater than 20% for return to function prior to next surgery    Baseline 70%;  pt did not retake this today but reports no longer feeling limited with reaching and ADLs-05/13/20    Status Partially Met      PT LONG TERM GOAL #4   Title pt will be able to dress, bathe and perform home activities with improved ease    Status Achieved                   Patient will benefit from skilled therapeutic intervention in order to improve the following deficits and impairments:     Visit Diagnosis: Aftercare following surgery for neoplasm     Problem List Patient Active Problem List   Diagnosis Date Noted  . Invasive ductal carcinoma of breast, female, right ([place]) 05/18/2020  . Menopausal syndrome (hot flushes) 12/23/2019  . Monoallelic mutation of [id number] gene 12/19/2019  . Genetic testing 11/26/2019  . Family history of breast cancer 11/19/2019  . Malignant neoplasm of upper-outer quadrant of right breast in female, estrogen receptor positive ([place]) 11/17/2019  . S/P breast augmentation 09/18/2019  . Hx of migraines 06/20/2011  . GERD (gastroesophageal reflux disease) 06/20/2011  . Anemia 06/20/2011   Annia Friendly, PT 06/17/20 11:52 AM  [place], [place], [address] Phone: [phone]   Fax:  [phone]  Name: Willine Schwalbe MRN: [id number] Date of Birth: 03-10-1985

## 2020-06-21 ENCOUNTER — Other Ambulatory Visit (HOSPITAL_COMMUNITY): Payer: Self-pay

## 2020-06-21 MED FILL — Anastrozole Tab 1 MG: ORAL | 30 days supply | Qty: 30 | Fill #1 | Status: AC

## 2020-06-22 ENCOUNTER — Other Ambulatory Visit (HOSPITAL_COMMUNITY): Payer: Self-pay

## 2020-06-24 ENCOUNTER — Inpatient Hospital Stay: Payer: 59

## 2020-06-24 ENCOUNTER — Other Ambulatory Visit: Payer: Self-pay

## 2020-06-24 ENCOUNTER — Encounter: Payer: Self-pay | Admitting: *Deleted

## 2020-06-24 ENCOUNTER — Encounter: Payer: Self-pay | Admitting: Physical Therapy

## 2020-06-24 ENCOUNTER — Inpatient Hospital Stay (HOSPITAL_BASED_OUTPATIENT_CLINIC_OR_DEPARTMENT_OTHER): Payer: 59 | Admitting: Hematology

## 2020-06-24 ENCOUNTER — Encounter: Payer: Self-pay | Admitting: Hematology

## 2020-06-24 ENCOUNTER — Other Ambulatory Visit: Payer: 59

## 2020-06-24 ENCOUNTER — Ambulatory Visit: Payer: 59 | Admitting: Physical Therapy

## 2020-06-24 VITALS — BP 112/73 | HR 70 | Temp 97.6°F | Resp 16 | Ht 64.0 in | Wt 129.9 lb

## 2020-06-24 DIAGNOSIS — Z170 Estrogen receptor positive status [ER+]: Secondary | ICD-10-CM

## 2020-06-24 DIAGNOSIS — C50411 Malignant neoplasm of upper-outer quadrant of right female breast: Secondary | ICD-10-CM | POA: Diagnosis not present

## 2020-06-24 DIAGNOSIS — Z95828 Presence of other vascular implants and grafts: Secondary | ICD-10-CM

## 2020-06-24 DIAGNOSIS — Z5111 Encounter for antineoplastic chemotherapy: Secondary | ICD-10-CM | POA: Diagnosis not present

## 2020-06-24 LAB — CBC WITH DIFFERENTIAL (CANCER CENTER ONLY)
Abs Immature Granulocytes: 0.02 10*3/uL (ref 0.00–0.07)
Basophils Absolute: 0 10*3/uL (ref 0.0–0.1)
Basophils Relative: 0 %
Eosinophils Absolute: 0.1 10*3/uL (ref 0.0–0.5)
Eosinophils Relative: 2 %
HCT: 34.2 % — ABNORMAL LOW (ref 36.0–46.0)
Hemoglobin: 11.4 g/dL — ABNORMAL LOW (ref 12.0–15.0)
Immature Granulocytes: 0 %
Lymphocytes Relative: 34 %
Lymphs Abs: 2.6 10*3/uL (ref 0.7–4.0)
MCH: 31.4 pg (ref 26.0–34.0)
MCHC: 33.3 g/dL (ref 30.0–36.0)
MCV: 94.2 fL (ref 80.0–100.0)
Monocytes Absolute: 0.5 10*3/uL (ref 0.1–1.0)
Monocytes Relative: 7 %
Neutro Abs: 4.2 10*3/uL (ref 1.7–7.7)
Neutrophils Relative %: 57 %
Platelet Count: 239 10*3/uL (ref 150–400)
RBC: 3.63 MIL/uL — ABNORMAL LOW (ref 3.87–5.11)
RDW: 11.8 % (ref 11.5–15.5)
WBC Count: 7.5 10*3/uL (ref 4.0–10.5)
nRBC: 0 % (ref 0.0–0.2)

## 2020-06-24 LAB — CMP (CANCER CENTER ONLY)
ALT: 50 U/L — ABNORMAL HIGH (ref 0–44)
AST: 35 U/L (ref 15–41)
Albumin: 4 g/dL (ref 3.5–5.0)
Alkaline Phosphatase: 67 U/L (ref 38–126)
Anion gap: 10 (ref 5–15)
BUN: 11 mg/dL (ref 6–20)
CO2: 26 mmol/L (ref 22–32)
Calcium: 9.7 mg/dL (ref 8.9–10.3)
Chloride: 106 mmol/L (ref 98–111)
Creatinine: 0.7 mg/dL (ref 0.44–1.00)
GFR, Estimated: >60 mL/min (ref >60)
Glucose, Bld: 84 mg/dL (ref 70–99)
Potassium: 4.2 mmol/L (ref 3.5–5.1)
Sodium: 142 mmol/L (ref 135–145)
Total Bilirubin: 0.3 mg/dL (ref 0.3–1.2)
Total Protein: 7.6 g/dL (ref 6.5–8.1)

## 2020-06-24 MED ORDER — CYCLOPHOSPHAMIDE CHEMO INJECTION 1 GM
500.0000 mg/m2 | Freq: Once | INTRAMUSCULAR | Status: AC
  Administered 2020-06-24: 820 mg via INTRAVENOUS
  Filled 2020-06-24: qty 41

## 2020-06-24 MED ORDER — SODIUM CHLORIDE 0.9% FLUSH
10.0000 mL | INTRAVENOUS | Status: AC | PRN
Start: 2020-06-24 — End: 2020-06-24
  Administered 2020-06-24: 10 mL
  Filled 2020-06-24: qty 10

## 2020-06-24 MED ORDER — SODIUM CHLORIDE 0.9 % IV SOLN
Freq: Once | INTRAVENOUS | Status: AC
Start: 2020-06-24 — End: 2020-06-24
  Filled 2020-06-24: qty 250

## 2020-06-24 MED ORDER — SODIUM CHLORIDE 0.9 % IV SOLN
10.0000 mg | Freq: Once | INTRAVENOUS | Status: AC
  Administered 2020-06-24: 10 mg via INTRAVENOUS
  Filled 2020-06-24: qty 10

## 2020-06-24 MED ORDER — GOSERELIN ACETATE 3.6 MG ~~LOC~~ IMPL
3.6000 mg | DRUG_IMPLANT | Freq: Once | SUBCUTANEOUS | Status: AC
  Administered 2020-06-24: 3.6 mg via SUBCUTANEOUS

## 2020-06-24 MED ORDER — SODIUM CHLORIDE 0.9 % IV SOLN
150.0000 mg | Freq: Once | INTRAVENOUS | Status: AC
  Administered 2020-06-24: 150 mg via INTRAVENOUS
  Filled 2020-06-24: qty 150

## 2020-06-24 MED ORDER — GOSERELIN ACETATE 3.6 MG ~~LOC~~ IMPL
DRUG_IMPLANT | SUBCUTANEOUS | Status: AC
  Filled 2020-06-24: qty 3.6

## 2020-06-24 MED ORDER — PALONOSETRON HCL INJECTION 0.25 MG/5ML
0.2500 mg | Freq: Once | INTRAVENOUS | Status: AC
  Administered 2020-06-24: 0.25 mg via INTRAVENOUS

## 2020-06-24 MED ORDER — DOXORUBICIN HCL CHEMO IV INJECTION 2 MG/ML
50.0000 mg/m2 | Freq: Once | INTRAVENOUS | Status: AC
  Administered 2020-06-24: 82 mg via INTRAVENOUS
  Filled 2020-06-24: qty 41

## 2020-06-24 NOTE — Patient Instructions (Signed)
[facility]ICAL [facility]COLOG[facility]  Discharge Instructions: Thank you for choosing [facility] to provide your oncology and hematology care.   If you have a lab appointment with the Cancer Center, please go directly to the Cancer Center and check in at the registration area.   Wear comfortable clothing and clothing appropriate for easy access to any Portacath or PICC line.   We strive to give you quality time with your provider. You may need to reschedule your appointment if you arrive late (15 or more minutes).  Arriving late affects you and other patients whose appointments are after yours.  Also, if you miss three or more appointments without notifying the office, you may be dismissed from the clinic at the provider's discretion.      For prescription refill requests, have your pharmacy contact our office and allow 72 hours for refills to be completed.    Today you received the following chemotherapy and/or immunotherapy agents: Adriamycin, Cytoxan     To help prevent nausea and vomiting after your treatment, we encourage you to take your nausea medication as directed.  BELOW ARE SYMPTOMS THAT SHOULD BE REPORTED IMMEDIATELY: . *FEVER GREATER THAN 100.4 F (38 C) OR HIGHER . *CHILLS OR SWEATING . *NAUSEA AND VOMITING THAT IS NOT CONTROLLED WITH YOUR NAUSEA MEDICATION . *UNUSUAL SHORTNESS OF BREATH . *UNUSUAL BRUISING OR BLEEDING . *URINARY PROBLEMS (pain or burning when urinating, or frequent urination) . *BOWEL PROBLEMS (unusual diarrhea, constipation, pain near the anus) . TENDERNESS IN MOUTH AND THROAT WITH OR WITHOUT PRESENCE OF ULCERS (sore throat, sores in mouth, or a toothache) . UNUSUAL RASH, SWELLING OR PAIN  . UNUSUAL VAGINAL DISCHARGE OR ITCHING   Items with * indicate a potential emergency and should be followed up as soon as possible or go to the Emergency Department if any problems should occur.  Please show the CHEMOTHERAPY ALERT CARD or  IMMUNOTHERAPY ALERT CARD at check-in to the Emergency Department and triage nurse.  Should you have questions after your visit or need to cancel or reschedule your appointment, please contact [facility]  Dept: [phone]  and follow the prompts.  Office hours are 8:00 a.m. to 4:30 p.m. Monday - Friday. Please note that voicemails left after 4:00 p.m. may not be returned until the following business day.  We are closed weekends and major holidays. You have access to a nurse at all times for urgent questions. Please call the main number to the clinic Dept: [phone] and follow the prompts.   For any non-urgent questions, you may also contact your provider using MyChart. We now offer e-Visits for anyone 18 and older to request care online for non-urgent symptoms. For details visit [email or web address].   Also download the MyChart app! Go to the app store, search "MyChart", open the app, select , and log in with your MyChart username and password.  Due to Covid, a mask is required upon entering the hospital/clinic. If you do not have a mask, one will be given to you upon arrival. For doctor visits, patients may have 1 support person aged 18 or older with them. For treatment visits, patients cannot have anyone with them due to current Covid guidelines and our immunocompromised population.   

## 2020-06-24 NOTE — Progress Notes (Signed)
Caitlyn Miller   Telephone:([phone]) [phone] Fax:([phone]) [phone]   Clinic Follow up Note   Patient Care Team: [name], Lilia Argue, MD as PCP - General (Family Medicine) Mansouraty, Telford Nab., MD as Consulting Physician (Gastroenterology) Mauro Kaufmann, RN as Oncology Nurse Navigator Rockwell Germany, RN as Oncology Nurse Navigator Donnie Mesa, MD as Consulting Physician (General Surgery) Truitt Merle, MD as Consulting Physician (Hematology) Kyung Rudd, MD as Consulting Physician (Radiation Oncology) Contogiannis, Audrea Muscat, MD as Consulting Physician (Plastic Surgery)  Date of Service:  06/24/2020  CHIEF COMPLAINT: f/u of right breast cancer  SUMMARY OF ONCOLOGIC HISTORY: Oncology History Overview Note  Cancer Staging Malignant neoplasm of upper-outer quadrant of right breast in female, estrogen receptor positive ([place]) Staging form: Breast, AJCC 8th Edition - Clinical stage from 11/12/2019: Stage IIA (cT2, cN1, cM0, G2, ER+, PR+, HER2-) - Signed by Truitt Merle, MD on 11/19/2019 Stage prefix: Initial diagnosis Histologic grading system: 3 grade system - Pathologic stage from 04/15/2020: No Stage Recommended (ypT2, pN2a, cM0, G2, ER+, PR+, HER2-) - Signed by Truitt Merle, MD on 05/27/2020 Stage prefix: Post-therapy Histologic grading system: 3 grade system Residual tumor (R): R1 - Microscopic    Malignant neoplasm of upper-outer quadrant of right breast in female, estrogen receptor positive ([place])  11/05/2019 Mammogram   IMPRESSION: Large irregular palpable mass 4.7 x 1.4 x 1.4 cm within the upper-outer right breast 11 o'clock position, 4 cm from nipple.  There is a large area of associated coarse heterogeneous calcifications within the mass. Overall findings are concerning for breast carcinoma.   Two cortically thickened right axillary lymph nodes which are indeterminate in etiology.   11/12/2019 Cancer Staging   Staging form: Breast, AJCC 8th Edition - Clinical stage  from 11/12/2019: Stage IIA (cT2, cN1, cM0, G2, ER+, PR+, HER2-) - Signed by Truitt Merle, MD on 11/19/2019    11/12/2019 Initial Biopsy   Diagnosis 1. Breast, right, needle core biopsy, right - INVASIVE MAMMARY CARCINOMA, SEE COMMENT. - MAMMARY CARCINOMA IN SITU. 2. Lymph node, needle/core biopsy, right - METASTATIC MAMMARY CARCINOMA. Microscopic Comment 1. The carcinoma appears grade 2 and measures 16 mm in greatest linear extent. E-cadherin will be ordered. Prognostic makers will be ordered. Dr. Saralyn Pilar has reviewed the case. The case was called to [facility] on 01/13/2020.    1. E-cadherin is strongly positive consistent with a ductal phenotype.   11/12/2019 Receptors her2   1. PROGNOSTIC INDICATORS Results: IMMUNOHISTOCHEMICAL AND MORPHOMETRIC ANALYSIS PERFORMED MANUALLY The tumor cells are NEGATIVE for Her2 (0). Estrogen Receptor: 100%, POSITIVE, STRONG STAINING INTENSITY Progesterone Receptor: 100%, POSITIVE, STRONG STAINING INTENSITY Proliferation Marker Ki67: 5%   11/17/2019 Initial Diagnosis   Malignant neoplasm of upper-outer quadrant of right breast in female, estrogen receptor positive ([place])    11/25/2019 Breast MRI   IMPRESSION: 1. Large area of non-mass enhancement involving the UPPER OUTER QUADRANT of the RIGHT breast measuring approximately 4.2 x 4.1 x 2.4 cm. The biopsy-proven IDC and DCIS is present at the superomedial aspect of this NME. 2. No MRI evidence of malignancy involving the LEFT breast. 3. Intact BILATERAL retropectoral implants. 4. 2 pathologically enlarged RIGHT axillary lymph nodes. One of these nodes is biopsy-proven metastatic disease. No pathologic lymphadenopathy elsewhere   11/25/2019 PET scan   IMPRESSION: 1. Two mildly enlarged hypermetabolic right axillary lymph nodes compatible with right axillary nodal metastases. 2. No additional sites of hypermetabolic metastatic disease. 3. Asymmetric indistinct upper right  breast hypermetabolism, without discrete mass  correlate on the CT images, compatible with known primary right breast malignancy.     11/26/2019 Genetic Testing   Negative genetic testing: no pathogenic variants detected in [facility] STAT Breast Cancer Panel.  Variant of uncertain significance (VUS) detected in CHEK2 at c.1556G>T (p.Arg519Leu).  The report date is November 26, 2019.   The STAT Breast cancer panel offered by [facility] includes sequencing and rearrangement analysis for the following 9 genes:  ATM, BRCA1, BRCA2, CDH1, CHEK2, PALB2, PTEN, STK11 and TP53.    Results of [facility] Multi-Cancer Panel are pending.    12/01/2019 -  Anti-estrogen oral therapy   Neoadjuvant Tamoxifen 75m on 12/01/19. Reduced to 161mon 12/09/19.       ---Zoladex injection monthly starting 12/09/19.       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  Variants of uncertain significance detected RAD51D at c.715C>T (p.Arg239Trp) and CHEK2 at c.1556G>T (p.Arg519Leu).  The report date is December 08, 2019.   The Multi-Cancer Panel offered by [facility] includes sequencing and/or deletion duplication testing of the following 85 genes: AIP, ALK, APC, ATM, AXIN2,BAP1,  BARD1, BLM, BMPR1A, BRCA1, BRCA2, BRIP1, CASR, CDC73, CDH1, CDK4, CDKN1B, CDKN1C, CDKN2A (p14ARF), CDKN2A (p16INK4a), CEBPA, CHEK2, CTNNA1, DICER1, DIS3L2, EGFR (c.2369C>T, p.Thr790Met variant only), EPCAM (Deletion/duplication testing only), FH, FLCN, GATA2, GPC3, GREM1 (Promoter region deletion/duplication testing only), HOXB13 (c.251G>A, p.Gly84Glu), HRAS, KIT, MAX, MEN1, MET, MITF (c.952G>A, p.Glu318Lys variant only), MLH1, MSH2, MSH3, MSH6, MUTYH, NBN, NF1, NF2, NTHL1, PALB2, PDGFRA, PHOX2B, PMS2, POLD1, POLE, POT1, PRKAR1A, PTCH1, PTEN, RAD50, RAD51C, RAD51D, RB1, RECQL4, RET, RNF43, RUNX1, SDHAF2,  SDHA (sequence changes only), SDHB, SDHC, SDHD, SMAD4, SMARCA4, SMARCB1, SMARCE1, STK11, SUFU, TERC, TERT, TMEM127, TP53, TSC1, TSC2, VHL, WRN and WT1.    12/09/2019 Miscellaneous   Mammaprint luminal type A, low risk  10% risk of recurrence in 10 years with 97.8% benefor of hormaonal therapy alone.    01/12/2020 - 03/25/2020 Chemotherapy   Verzenio started on 01/12/2020, dose reduced to 5030mm and 100m62m11/2022 due to poor tolerance. Held Verzenio on 03/25/20 to proceed with breast surgery.    03/01/2020 Mammogram   Targeted ultrasound is performed, showing interval decreasing conspicuity of the patient's known right breast cancer. A post biopsy clip with an associated irregular area shadowing is demonstrated at the 11 o'clock position 4 cm from the nipple. Exact measurements are difficult due to the vague appearance of the findings. It measures approximately 1.2 x 0.9 x 0.6 cm (previously 4.7 x 2.2 x 1.5 cm).   IMPRESSION: Imaging findings consistent with good response to chemotherapy.   03/16/2020 Imaging   MRI Breast  IMPRESSION: 1. Interval resolution of RIGHT axillary adenopathy. 2. Slightly smaller area of non mass enhancement in the UPPER-OUTER QUADRANT of the RIGHT breast.   04/15/2020 Surgery   RIGHT BREAST LUMPECTOMY WITH RADIOACTIVE SEED LOCALIZATION and RADIOACTIVE SEED GUIDED RIGHT AXILLARY SENTINEL LYMPH NODE DISSECTION and SENTINEL NODE BIOPSY by Dr TsueGeorgette Dover3/31/2022 Pathology Results   FINAL MICROSCOPIC DIAGNOSIS:   A. 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  - Invasive carcinoma broadly involves each the anterior, posterior,  superior and inferior margins.  - DCIS involves the inferior margin and is < 1 mm from each  the anterior  and superior margins.  - Atypical lobular  hyperplasia.  - Biopsy sites.  - See oncology table.   B. TARGETED LYMPH NODE, RIGHT AXILLA, BIOPSY:  - Metastatic carcinoma in (1) of (1) lymph node.  - Biopsy site.   C. SENTINEL LYMPH NODE, RIGHT AXILLA #1, BIOPSY:  - Metastatic carcinoma in (1) of (1) lymph node.   D. SENTINEL LYMPH NODE, RIGHT AXILLA #2, BIOPSY:  - One lymph node, negative for carcinoma (0/1).    04/15/2020 Cancer Staging   Staging form: Breast, AJCC 8th Edition - Pathologic stage from 04/15/2020: No Stage Recommended (ypT2, pN2a, cM0, G2, ER+, PR+, HER2-) - Signed by Truitt Merle, MD on 05/27/2020  Stage prefix: Post-therapy  Histologic grading system: 3 grade system  Residual tumor (R): R1 - Microscopic    05/18/2020 Surgery   Right Mastectomy  A. BREAST, RIGHT, MASTECTOMY:  - Residual invasive carcinoma with mixed ductal and lobular features.       Residual invasive carcinoma broadly involves the anterior inferior  soft tissue margin.       Residual invasive carcinoma is focally < 1 mm from the deep margin.  - Residual ductal carcinoma in situ.       Residual DCIS is 3 mm from the closest margin, deep.  - Residual atypical lobular hyperplasia.  - Prior procedure site changes.  - Biopsy clip (X2).   B. BREAST IMPLANT, RIGHT, EXPLANTATION:  - Implant (gross only).   C. LYMPH NODES, RIGHT AXILLA, DISSECTION:  - Metastatic carcinoma in (2) of (12) lymph nodes.  - Prior procedure site changes in surrounding soft tissue.    06/24/2020 -  Chemotherapy    Patient is on Treatment Plan: BREAST ADJUVANT DOSE DENSE AC Q14D / PACLITAXEL Q7D          CURRENT THERAPY:  Adjuvant AC, starting 06/24/20  INTERVAL HISTORY:  Caitlyn Miller is here for a follow up of breast cancer. She was last seen by me on 06/16/20 for symptom management. She presents to the clinic alone. She reports no new joint pain, chronic pain stable. She notes her incision is healing well. She notes she is walking more.  All other systems  were reviewed with the patient and are negative.  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   Pelvic pain 09/2008    SURGICAL HISTORY: Past Surgical History:  Procedure Laterality Date   AUGMENTATION MAMMAPLASTY Bilateral 2956   silicone    BREAST IMPLANT REMOVAL Right 05/18/2020   Procedure: REMOVAL OF RIGHT BREAST IMPLANT;  Surgeon: Donnie Mesa, MD;  Location: [facility];  Service: General;  Laterality: Right;   BREAST LUMPECTOMY WITH RADIOACTIVE SEED LOCALIZATION Right 04/15/2020   Procedure: RIGHT BREAST LUMPECTOMY WITH RADIOACTIVE SEED LOCALIZATION;  Surgeon: Donnie Mesa, MD;  Location: [facility];  Service: General;  Laterality: Right;   MODIFIED MASTECTOMY Right 05/18/2020   Procedure: RIGHT MODIFIED RADICAL MASTECTOMY;  Surgeon: Donnie Mesa, MD;  Location: [place];  Service: General;  Laterality: Right;   PORTACATH PLACEMENT Left 06/10/2020   Procedure: INSERTION PORT-A-CATH;  Surgeon: Donnie Mesa, MD;  Location: [facility];  Service: General;  Laterality: Left;  45 MINUTES ROOM 2   RADIOACTIVE SEED GUIDED AXILLARY SENTINEL LYMPH NODE Right 04/15/2020   Procedure: RADIOACTIVE SEED GUIDED RIGHT AXILLARY SENTINEL LYMPH NODE DISSECTION;  Surgeon: Donnie Mesa, MD;  Location: [place] [facility];  Service: General;  Laterality: Right;   SENTINEL NODE BIOPSY N/A 04/15/2020   Procedure: SENTINEL NODE  BIOPSY;  Surgeon: Donnie Mesa, MD;  Location: [facility];  Service: General;  Laterality: N/A;   WISDOM TOOTH EXTRACTION      I have reviewed the social history and family history with the patient and they are unchanged from previous note.  ALLERGIES:  is allergic to ibuprofen.  MEDICATIONS:  Current Outpatient Medications  Medication Sig Dispense Refill   acetaminophen (TYLENOL) 500 MG tablet Take 500 mg by mouth every 6 (six) hours as needed for moderate pain.     anastrozole (ARIMIDEX) 1 MG tablet TAKE 1 TABLET BY MOUTH DAILY. (Patient taking differently: Take 1 mg by mouth at bedtime.) 30 tablet 3   clindamycin (CLINDAGEL) 1 % gel Apply topically 2 (two) times daily. (Patient not taking: No sig reported) 30 g 0   gabapentin (NEURONTIN) 100 MG capsule Take 2 capsules (200 mg total) by mouth at bedtime. 60 capsule 1   lidocaine-prilocaine (EMLA) cream Apply to affected area once (Patient taking differently: Apply 1 application topically daily as needed (port access).) 30 g 3   ondansetron (ZOFRAN ODT) 4 MG disintegrating tablet Take 1 tablet (4 mg total) by mouth every 8 (eight) hours as needed for nausea or vomiting. (Patient not taking: No sig reported) 20 tablet 0   ondansetron (ZOFRAN) 8 MG tablet Take 1 tablet (8 mg total) by mouth 2 (two) times daily as needed. Start on the third day after chemotherapy. (Patient taking differently: Take 8 mg by mouth 2 (two) times daily as needed for vomiting or nausea. Start on the third day after chemotherapy.) 30 tablet 1   pantoprazole (PROTONIX) 40 MG tablet Take 1 tablet (40 mg total) by mouth daily as needed. (Patient taking differently: Take 40 mg by mouth daily as needed (heartburn).) 30 tablet 5   prochlorperazine (COMPAZINE) 10 MG tablet Take 1 tablet (10 mg total) by mouth every 6 (six) hours as needed (Nausea or vomiting). (Patient not taking: No sig reported) 30 tablet 1   prochlorperazine (COMPAZINE) 10 MG tablet Take 10  mg by mouth every 6 (six) hours as needed for nausea or vomiting.     prochlorperazine (COMPAZINE) 5 MG tablet Take 1-2 tablets (5-10 mg total) by mouth every 6 (six) hours as needed for nausea or vomiting. 30 tablet 1   sucralfate (CARAFATE) 1 g tablet Take 1 tablet (1 g total) by mouth daily as needed (Upset stomach). 30 tablet 1   traMADol (ULTRAM) 50 MG tablet Take 1 tablet (50 mg total) by mouth every 6 (six) hours as needed (mild pain). (Patient not taking: No sig reported) 30 tablet 0   venlafaxine (EFFEXOR) 37.5 MG tablet TAKE 1 TABLET(37.5 MG) BY MOUTH TWICE DAILY (Patient taking differently: Take 37.5 mg by mouth daily.) 30 tablet 4   No current  facility-administered medications for this visit.    PHYSICAL EXAMINATION: ECOG PERFORMANCE STATUS: 2 - Symptomatic, <50% confined to bed  Vitals:   06/24/20 1334  BP: 112/73  Pulse: 70  Resp: 16  Temp: 97.6 F (36.4 C)  SpO2: 100%   Filed Weights   06/24/20 1334  Weight: 129 lb 14.4 oz (58.9 kg)    Due to COVID19 we will limit examination to appearance. Patient had no complaints.  GENERAL:alert, no distress and comfortable SKIN: skin color normal, no rashes or significant lesions EYES: normal, Conjunctiva are pink and non-injected, sclera clear  NEURO: alert & oriented x 3 with fluent speech  LABORATORY DATA:  I have reviewed the data as listed CBC Latest Ref Rng & Units 06/24/2020 06/17/2020 06/16/2020  WBC 4.0 - 10.5 K/uL 7.5 6.2 6.7  Hemoglobin 12.0 - 15.0 g/dL 11.4(L) 12.4 12.1  Hematocrit 36.0 - 46.0 % 34.2(L) 37.5 35.5(L)  Platelets 150 - 400 K/uL 239 231 217     CMP Latest Ref Rng & Units 06/24/2020 06/17/2020 06/16/2020  Glucose 70 - 99 mg/dL 84 101(H) 110(H)  BUN 6 - 20 mg/dL 11 11 10   Creatinine 0.44 - 1.00 mg/dL 0.70 0.58 0.66  Sodium 135 - 145 mmol/L 142 140 143  Potassium 3.5 - 5.1 mmol/L 4.2 4.2 4.1  Chloride 98 - 111 mmol/L 106 105 106  CO2 22 - 32 mmol/L 26 27 25   Calcium 8.9 - 10.3 mg/dL 9.7 9.9 9.6  Total  Protein 6.5 - 8.1 g/dL 7.6 - 7.7  Total Bilirubin 0.3 - 1.2 mg/dL 0.3 - 0.3  Alkaline Phos 38 - 126 U/L 67 - 89  AST 15 - 41 U/L 35 - 50(H)  ALT 0 - 44 U/L 50(H) - 83(H)      RADIOGRAPHIC STUDIES: I have personally reviewed the radiological images as listed and agreed with the findings in the report. No results found.   ASSESSMENT & PLAN:  Caitlyn Miller is a 35 y.o. female with   1. Malignant neoplasm of upper-outer quadrant of right breast, Stage IIA, p(T2N1aM0), ER+/PR+/HER2-, Grade II, Mammaprint luminal type A, low risk -She has a 4.7cm right breast mass at 11:00 position and two abnormal right axillary LN. 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 -Her 11/2019 breast MRI and PET scan did not indicate left breast or distant malignancy or metastasis.  -Due to her large tumor, she needs mastectomy if she proceeded with surgery first.  She has been seen by Dr. Georgette Dover. Her mastectomy will be complicated by history of implant placement. -Mammaprint showed low risk disease, she would unlikely benefit from chemotherapy, and neoadjuvant chemotherapy is not recommended.  -I have started her on neoadjuvant tamoxifen on 12/01/19. She tolerated poorly with nausea, and diffuse body aches. Dose reduced to 51m from 12/09/19. I started her on monthly Zoladex injection on 12/09/19. Then switched Tamoxifen to Anastrozole on 01/06/20. -I started her on Verzenio on 01/12/2020, dose reduced to 552mam and 10066m/11/2020 due to poor tolerance. Held Verzenio on 03/25/20 to proceed with breast surgery. -Given her 03/01/20 [place] mammogram indicated good response to treatment, she proceeded with right lumpectomy and sentinel LN dissection on 04/15/20 with Dr TsuGeorgette Doverurgical pathology showed larger than expected tumor at 3.1cm of invasive carcinoma with mixed ductal and lobular features and DCIS components. She also had  positive margins and 2/3 positive LNs. -She was recommended to proceed with mastectomy  and axillary node dissection due to her young age and positive nodes after neoadjuvant treatment. This was performed on 05/18/20 under Dr. Georgette Dover showing: residual invasive carcinoma and DCIS. She again had a positive margin and another 2/12 positive nodes. She had stage III disease.  -Due to her N2 disease, I recommend adjuvant chemotherapy to reduce her risk of recurrence, followed by postmastectomy radiation to reduce her risk of local recurrence. -PET scan was denied by her insurance.  Recent CT chest was negative for metastatic disease. -She will begin adjuvant AC today, 06/24/20, for 4 cycles and then taxol x12.  I reviewed chemo side effects with her again, especially the management of nausea, diet and exercise.  She voiced good understanding. -She will continue Zoladex injection. She does not plan to have more children.  We discussed chemo-induced infertility.   2. Generalized Weakness, body pain, Fatigue -Pt notes for the past 3 years she has had mid chest and mid back pain. Previously intermittent, now constant. She also notes b/l arm and leg weakness and pain along with tingling of her toes and fingers. -This has lead to fatigue, low appetite, decreased daily function/activity. -Pt notes prior workup for this in [place] was negative with Neurologist and cardiologist. They said this is related to stress, anxiety or depression. Pt feels she has an illness causing this instead. -Her 11/25/19 PET was negative for any distant malignancy or metastasis.    3. Anxiety, depression -Pt notes feeling more anxious (due to health before breast cancer), than depressed. -She was on Lexapro,  I switched her to Effexor, she is tolerating better    4. 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   -She has discussed  with our genetic consoler    5. Social Support -She is from [place] and lives both there and in [place] with her husband. She has 2 teenage children. The rest of her family is in [place].      PLAN:  -Continue Zoladex injection every 4 weeks, will proceed with today.   -stop anastrozole during chemotherapy -labs reviewed, adequate to begin first cycle chemo AC with GCSF today, will reduced dose for first cycle due to her low PS -I will call her on Tuesday, 06/29/20, to check in with her. -C2, labs, and f/u in 2 weeks      No problem-specific Assessment & Plan notes found for this encounter.   No orders of the defined types were placed in this encounter.  All questions were answered. The patient knows to call the clinic with any problems, questions or concerns. No barriers to learning was detected. The total time spent in the appointment was 30 minutes.     Truitt Merle, MD 06/24/2020   I, Wilburn Mylar, am acting as scribe for Truitt Merle, MD.   I have reviewed the above documentation for accuracy and completeness, and I agree with the above.

## 2020-06-25 ENCOUNTER — Telehealth: Payer: Self-pay | Admitting: *Deleted

## 2020-06-25 NOTE — Telephone Encounter (Signed)
-----   Message from Tildon Husky, RN sent at 06/24/2020  4:52 PM EDT ----- Regarding: first time chemo follow up call Pt received chemo for the first time today. She received adriamycin and cytoxan today. She is seen here by Dr. Burr Medico. She did really well with treatment with no issues.

## 2020-06-25 NOTE — Telephone Encounter (Signed)
Left message for pt to return call to discuss how she did with her treatment yesterday.

## 2020-06-26 ENCOUNTER — Other Ambulatory Visit: Payer: Self-pay

## 2020-06-26 ENCOUNTER — Inpatient Hospital Stay: Payer: 59

## 2020-06-26 VITALS — BP 126/75 | HR 82 | Temp 97.4°F | Resp 18

## 2020-06-26 DIAGNOSIS — Z170 Estrogen receptor positive status [ER+]: Secondary | ICD-10-CM

## 2020-06-26 DIAGNOSIS — Z5111 Encounter for antineoplastic chemotherapy: Secondary | ICD-10-CM | POA: Diagnosis not present

## 2020-06-26 MED ORDER — PEGFILGRASTIM-JMDB 6 MG/0.6ML ~~LOC~~ SOSY
6.0000 mg | PREFILLED_SYRINGE | Freq: Once | SUBCUTANEOUS | Status: AC
  Administered 2020-06-26: 6 mg via SUBCUTANEOUS

## 2020-06-26 NOTE — Patient Instructions (Signed)
Pegfilgrastim injection Quel est ce mdicament ? Le PEGFILGRASTIM est un facteur stimulant des colonies de granulocytes  longue dure d'action qui favorise la croissance des neutrophiles, un type de globules blancs important permettant  l'organisme de [employer] infections. Il est utilis pour rduire la frquence de la fivre et des infections Customer service manager les patients atteints de certains types de cancer recevant une chimiothrapie qui affecte la moelle osseuse, ainsi que pour prolonger la survie aprs uneexposition  des doses de rayonnement leves. Ce mdicament peut tre utilis pour d'autres indications ; adressez-vous votre mdecin ou  votre pharmacien si vous [employer] questions. NOM(S) DE MARQUE COURANT(S) : Fulphila, Neulasta, Nyvepria, UDENYCA, Ziextenzo Que dois-je dire  mon fournisseur de [employer] de prendre ce mdicament ? Ils ont besoin de savoir si vous prsentez l'une quelconque des affections ousituations suivantes : maladie rnale allergie au latex radiothrapie en cours drpanocytose ractions cutanes aux adhsifs acryliques (injecteur corporel seulement) raction inhabituelle ou allergique au pegfilgrastim ou au filgrastim raction inhabituelle ou allergique  d'autres mdicaments, aliments, colorants ou conservateurs vous tes enceinte ou cherchez  tre enceinte vous allaitez Comment dois-je utiliser ce mdicament ? Ce mdicament est destin  tre inject sous la peau. Si vous recevez ce mdicament pour [employer]  domicile, on vous expliquera comment le prparer et administrer la seringue prremplie ou comment utiliser l'injecteur corporel. Suivez les instructions d'utilisation dtailles figurant dans la notice. Utilisez-le exactement [employer] instructions. Avertissez immdiatement votre mdecin ou votre fournisseur de soins si vous suspectez que l'injecteur corporel pourrait ne pas avoir fonctionn comme prvu ou si vous pensez quel'injecteur corporel n'a pas  administr la dose ou seulement une dose [employer]. Il est important que vous mettiez vos aiguilles et [employer] un rcipient spcial pour objets pointus et tranchants. Ne les [employer]. Si vous ne disposez pas d'un rcipient de ce type, appelez votrepharmacien ou votre fournisseur de soins pour [employer] un. Interrogez votre pdiatre quant  l'utilisation de ce mdicament [employer]. Bien que ce mdicament puisse tre prescrit pour certaines affections, desprcautions s'imposent. Surdosage : si vous pensez avoir pris ce mdicament en excs, contactezimmdiatement un centre [employer] ou un service d'urgences. REMARQUE : ce mdicament vous est uniquement destin. Ne partagez pas Secondary school teacher. Que faire si je saute une dose ? Il est important de ne pas sauter de dose. Contactez votre mdecin ou votre fournisseur de soins si vous manquez une dose. Si vous manquez une dose en raison d'un dysfonctionnement ou d'une fuite de Teacher, English as a foreign language corporel, une nouvelle dose doit tre administre ds que possible, manuellement,  l'aided'une seule seringue premplie. Quelles sont les interactions possibles avec ce mdicament ? Les interactions n'ont pas t tudies. Cette liste peut ne pas dcrire toutes les interactions mdicamenteuses possibles. Donnez  votre fournisseur de [employer] de tous les mdicaments, plantes mdicinales, mdicaments en vente libre ou complments alimentaires que vous prenez. Dites-lui aussi si vous fumez, buvez de l'alcool ou consommez des drogues illicites. Certaines substances peuvent interagir avecvotre mdicament. Que dois-je Museum/gallery exhibitions officer par ce mdicament ? Vous serez troitement surveill(e) Radio broadcast assistant par [employer]. Vous devrez [employer] de sang rgulires Radiographer, therapeutic par cemdicament. Discutez avec votre mdecin ou votre fournisseur de soins de votre risque de  cancer. Vous pouvez prsenter un risque plus lev de dvelopper certains typesde cancer si vous prenez ce mdicament. Si vous devez vous subir un examen IRM, un scanner ou toute autre  procdure, signalez  votre mdecin que vous utilisez ce mdicament (injecteur corporelseulement). Quels effets secondaires puis-je remarquer en prenant ce mdicament ? Effets secondaires que vous devez signaler  votre mdecin ou  votrefournisseur de soins ds que possible : ractions allergiques (ruption cutane, dmangeaisons ou urticaire, gonflement du visage, des lvres ou de Health visitor) Nature conservation officer vertiges fivre Veterinary surgeon, rougeur, Ship broker ou irritation au site d'injection taches rouges en pointe d'pingle sur la peau coloration rouge ou marron fonc des urines essoufflements ou problmes respiratoires douleurs  l'estomac ou sur le ct, ou douleurs dans l'paule gonflement sensation de fatigue miction (mission d'urine) difficile ou modifications de la quantit d'urine produite saignements ou ecchymoses (bleus) inhabituels Effets secondaires ne ncessitant pas habituellement de consultation mdicale (signalez-les  votre mdecin ou  votre fournisseur de soins s'ils persistentou sont gnants) : douleur osseuse maux ou douleurs musculaires Cette liste peut ne pas dcrire tous les effets secondaires possibles. Appelez votre mdecin pour [employer] sujet des [employer]. Vouspouvez signaler les effets secondaires  la FDA, au [phone]. O dois-je conserver mon mdicament ? Conserver hors de Insurance underwriter. Si vous prenez ce mdicament chez vous, vous recevrez des [employer] dont il doit tre Ambulance person. Jeter tout mdicament inutilis aprs la date depremption figurant sur l'tiquette ou sur Risk manager. REMARQUE : cette notice est un rsum. Elle peut ne pas contenir toutes les informations possibles. Si vous avez des questions   propos de ce mdicament,consultez votre mdecin, votre pharmacien ou votre fournisseur de soins.  2022 Elsevier/Gold Standard (2019-07-08 00:00:00)

## 2020-06-28 ENCOUNTER — Encounter: Payer: Self-pay | Admitting: Hematology

## 2020-06-28 ENCOUNTER — Other Ambulatory Visit: Payer: Self-pay

## 2020-06-28 ENCOUNTER — Telehealth: Payer: Self-pay

## 2020-06-28 ENCOUNTER — Telehealth: Payer: Self-pay | Admitting: Hematology

## 2020-06-28 ENCOUNTER — Telehealth: Payer: Self-pay | Admitting: *Deleted

## 2020-06-28 DIAGNOSIS — C50411 Malignant neoplasm of upper-outer quadrant of right female breast: Secondary | ICD-10-CM

## 2020-06-28 MED ORDER — ONDANSETRON HCL 8 MG PO TABS: 8.0000 mg | ORAL_TABLET | Freq: Three times a day (TID) | ORAL | 2 refills | Status: DC | PRN

## 2020-06-28 MED ORDER — PROCHLORPERAZINE MALEATE 10 MG PO TABS: 10.0000 mg | ORAL_TABLET | Freq: Four times a day (QID) | ORAL | 3 refills | Status: DC | PRN

## 2020-06-28 NOTE — Telephone Encounter (Signed)
Pt called & left vm for nurse to call her.  Returned call.  She reports having some stomach pain, "stomach on fire" .  Dr Burr Medico is gone for the day. Asked if she was taking protonix & she reports that she took yesterday. Encouraged to cont daily & add in carafate.  Suggested milk/tums & compazine prn nausea.  Pt reports that Dr Burr Medico will be calling her tomorrow & she will discuss further with her. Message routed to Dr Feng/Pod RN.

## 2020-06-28 NOTE — Telephone Encounter (Signed)
Rashid called stating Caitlyn Miller has been nauseated with occasional vomiting since having chemotherapy last week.  She is able to tolerate some po intake.  She has also had a h/a. She has not been taking zofran or compazine.  I reviewed use of these with him and have sent new prescriptions to the pharmacy.  I instructed him on a bland low fat, low fiber diet.  I also recommended pedialyte, broth, and ginger ale.  I recommended she take tylenol for the headache and explained this can be from cytoxan.  He verbalized understanding and will call if these recommendations do no help.

## 2020-06-28 NOTE — Telephone Encounter (Signed)
Scheduled follow-up appointments per 6/9 los. Patient's husband is aware.

## 2020-06-29 ENCOUNTER — Encounter: Payer: Self-pay | Admitting: *Deleted

## 2020-06-29 ENCOUNTER — Ambulatory Visit (HOSPITAL_COMMUNITY): Payer: 59

## 2020-06-29 ENCOUNTER — Other Ambulatory Visit: Payer: Self-pay | Admitting: Hematology

## 2020-06-29 ENCOUNTER — Inpatient Hospital Stay (HOSPITAL_BASED_OUTPATIENT_CLINIC_OR_DEPARTMENT_OTHER): Payer: 59 | Admitting: Hematology

## 2020-06-29 DIAGNOSIS — C50411 Malignant neoplasm of upper-outer quadrant of right female breast: Secondary | ICD-10-CM

## 2020-06-29 DIAGNOSIS — Z170 Estrogen receptor positive status [ER+]: Secondary | ICD-10-CM | POA: Diagnosis not present

## 2020-06-29 MED ORDER — MAGNESIUM OXIDE -MG SUPPLEMENT 400 (240 MG) MG PO TABS: 400.0000 mg | ORAL_TABLET | Freq: Every day | ORAL | 0 refills | Status: DC

## 2020-06-29 NOTE — Progress Notes (Signed)
[place]   [phone] [phone]   Clinic Follow up Note   Patient Care Team: [name], Lilia Argue, MD as PCP - General (Family Medicine) Mansouraty, Telford Nab., MD as Consulting Physician (Gastroenterology) Mauro Kaufmann, RN as Oncology Nurse Navigator Rockwell Germany, RN as Oncology Nurse Navigator Donnie Mesa, MD as Consulting Physician (General Surgery) Truitt Merle, MD as Consulting Physician (Hematology) Kyung Rudd, MD as Consulting Physician (Radiation Oncology) Contogiannis, Audrea Muscat, MD as Consulting Physician (Plastic Surgery) 06/30/2020  I connected with Caitlyn Miller on 06/30/20 at 1400 by telephone and verified that I am speaking with the correct person using two identifiers.   I discussed the limitations, risks, security and privacy concerns of performing an evaluation and management service by telephone and the availability of in person appointments. I also discussed with the patient that there may be a patient responsible charge related to this service. The patient expressed understanding and agreed to proceed.   Patient's location:  Home  Provider's location:  Office    CHIEF COMPLAINT: abdominal pain   SUMMARY OF ONCOLOGIC HISTORY: Oncology History Overview Note  Cancer Staging Malignant neoplasm of upper-outer quadrant of right breast in female, estrogen receptor positive ([place]) Staging form: Breast, AJCC 8th Edition - Clinical stage from 11/12/2019: Stage IIA (cT2, cN1, cM0, G2, ER+, PR+, HER2-) - Signed by Truitt Merle, MD on 11/19/2019 Stage prefix: Initial diagnosis Histologic grading system: 3 grade system - Pathologic stage from 04/15/2020: No Stage Recommended (ypT2, pN2a, cM0, G2, ER+, PR+, HER2-) - Signed by Truitt Merle, MD on 05/27/2020 Stage prefix: Post-therapy Histologic grading system: 3 grade system Residual tumor (R): R1 - Microscopic    Malignant neoplasm of upper-outer quadrant of right breast in female,  estrogen receptor positive ([place])  11/05/2019 Mammogram   IMPRESSION: Large irregular palpable mass 4.7 x 1.4 x 1.4 cm within the upper-outer right breast 11 o'clock position, 4 cm from nipple.  There is a large area of associated coarse heterogeneous calcifications within the mass. Overall findings are concerning for breast carcinoma.   Two cortically thickened right axillary lymph nodes which are indeterminate in etiology.   11/12/2019 Cancer Staging   Staging form: Breast, AJCC 8th Edition - Clinical stage from 11/12/2019: Stage IIA (cT2, cN1, cM0, G2, ER+, PR+, HER2-) - Signed by Truitt Merle, MD on 11/19/2019    11/12/2019 Initial Biopsy   Diagnosis 1. Breast, right, needle core biopsy, right - INVASIVE MAMMARY CARCINOMA, SEE COMMENT. - MAMMARY CARCINOMA IN SITU. 2. Lymph node, needle/core biopsy, right - METASTATIC MAMMARY CARCINOMA. Microscopic Comment 1. The carcinoma appears grade 2 and measures 16 mm in greatest linear extent. E-cadherin will be ordered. Prognostic makers will be ordered. Dr. Saralyn Pilar has reviewed the case. The case was called to [facility] on 01/13/2020.    1. E-cadherin is strongly positive consistent with a ductal phenotype.   11/12/2019 Receptors her2   1. PROGNOSTIC INDICATORS Results: IMMUNOHISTOCHEMICAL AND MORPHOMETRIC ANALYSIS PERFORMED MANUALLY The tumor cells are NEGATIVE for Her2 (0). Estrogen Receptor: 100%, POSITIVE, STRONG STAINING INTENSITY Progesterone Receptor: 100%, POSITIVE, STRONG STAINING INTENSITY Proliferation Marker Ki67: 5%   11/17/2019 Initial Diagnosis   Malignant neoplasm of upper-outer quadrant of right breast in female, estrogen receptor positive ([place])    11/25/2019 Breast MRI   IMPRESSION: 1. Large area of non-mass enhancement involving the UPPER OUTER QUADRANT of the RIGHT breast measuring approximately 4.2 x 4.1 x 2.4 cm. The biopsy-proven IDC and DCIS is present  at the superomedial aspect of  this NME. 2. No MRI evidence of malignancy involving the LEFT breast. 3. Intact BILATERAL retropectoral implants. 4. 2 pathologically enlarged RIGHT axillary lymph nodes. One of these nodes is biopsy-proven metastatic disease. No pathologic lymphadenopathy elsewhere   11/25/2019 PET scan   IMPRESSION: 1. Two mildly enlarged hypermetabolic right axillary lymph nodes compatible with right axillary nodal metastases. 2. No additional sites of hypermetabolic metastatic disease. 3. Asymmetric indistinct upper right breast hypermetabolism, without discrete mass correlate on the CT images, compatible with known primary right breast malignancy.     11/26/2019 Genetic Testing   Negative genetic testing: no pathogenic variants detected in [facility] STAT Breast Cancer Panel.  Variant of uncertain significance (VUS) detected in CHEK2 at c.1556G>T (p.Arg519Leu).  The report date is November 26, 2019.   The STAT Breast cancer panel offered by [facility] includes sequencing and rearrangement analysis for the following 9 genes:  ATM, BRCA1, BRCA2, CDH1, CHEK2, PALB2, PTEN, STK11 and TP53.    Results of [facility] Multi-Cancer Panel are pending.    12/01/2019 -  Anti-estrogen oral therapy   Neoadjuvant Tamoxifen 19m on 12/01/19. Reduced to 13mon 12/09/19.       ---Zoladex injection monthly starting 12/09/19.       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  Variants of uncertain significance detected RAD51D at c.715C>T (p.Arg239Trp) and CHEK2 at c.1556G>T (p.Arg519Leu).  The report date is December 08, 2019.   The Multi-Cancer Panel offered by [facility] includes sequencing and/or deletion duplication testing of the following 85 genes: AIP, ALK, APC, ATM, AXIN2,BAP1,  BARD1, BLM, BMPR1A, BRCA1, BRCA2, BRIP1, CASR, CDC73, CDH1, CDK4, CDKN1B, CDKN1C, CDKN2A  (p14ARF), CDKN2A (p16INK4a), CEBPA, CHEK2, CTNNA1, DICER1, DIS3L2, EGFR (c.2369C>T, p.Thr790Met variant only), EPCAM (Deletion/duplication testing only), FH, FLCN, GATA2, GPC3, GREM1 (Promoter region deletion/duplication testing only), HOXB13 (c.251G>A, p.Gly84Glu), HRAS, KIT, MAX, MEN1, MET, MITF (c.952G>A, p.Glu318Lys variant only), MLH1, MSH2, MSH3, MSH6, MUTYH, NBN, NF1, NF2, NTHL1, PALB2, PDGFRA, PHOX2B, PMS2, POLD1, POLE, POT1, PRKAR1A, PTCH1, PTEN, RAD50, RAD51C, RAD51D, RB1, RECQL4, RET, RNF43, RUNX1, SDHAF2, SDHA (sequence changes only), SDHB, SDHC, SDHD, SMAD4, SMARCA4, SMARCB1, SMARCE1, STK11, SUFU, TERC, TERT, TMEM127, TP53, TSC1, TSC2, VHL, WRN and WT1.    12/09/2019 Miscellaneous   Mammaprint luminal type A, low risk  10% risk of recurrence in 10 years with 97.8% benefor of hormaonal therapy alone.    01/12/2020 - 03/25/2020 Chemotherapy   Verzenio started on 01/12/2020, dose reduced to 5056mm and 100m49m11/2022 due to poor tolerance. Held Verzenio on 03/25/20 to proceed with breast surgery.    03/01/2020 Mammogram   Targeted ultrasound is performed, showing interval decreasing conspicuity of the patient's known right breast cancer. A post biopsy clip with an associated irregular area shadowing is demonstrated at the 11 o'clock position 4 cm from the nipple. Exact measurements are difficult due to the vague appearance of the findings. It measures approximately 1.2 x 0.9 x 0.6 cm (previously 4.7 x 2.2 x 1.5 cm).   IMPRESSION: Imaging findings consistent with good response to chemotherapy.   03/16/2020 Imaging   MRI Breast  IMPRESSION: 1. Interval resolution of RIGHT axillary adenopathy. 2. Slightly smaller area of non mass enhancement in the UPPER-OUTER QUADRANT of the RIGHT breast.   04/15/2020 Surgery   RIGHT BREAST LUMPECTOMY WITH RADIOACTIVE SEED LOCALIZATION and RADIOACTIVE SEED GUIDED RIGHT AXILLARY SENTINEL LYMPH NODE DISSECTION and SENTINEL  NODE BIOPSY by Dr Georgette Dover     04/15/2020 Pathology Results   FINAL MICROSCOPIC DIAGNOSIS:   A. 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  - Invasive carcinoma broadly involves each the anterior, posterior,  superior and inferior margins.  - DCIS involves the inferior margin and is < 1 mm from each the anterior  and superior margins.  - Atypical lobular hyperplasia.  - Biopsy sites.  - See oncology table.   B. TARGETED LYMPH NODE, RIGHT AXILLA, BIOPSY:  - Metastatic carcinoma in (1) of (1) lymph node.  - Biopsy site.   C. SENTINEL LYMPH NODE, RIGHT AXILLA #1, BIOPSY:  - Metastatic carcinoma in (1) of (1) lymph node.   D. SENTINEL LYMPH NODE, RIGHT AXILLA #2, BIOPSY:  - One lymph node, negative for carcinoma (0/1).    04/15/2020 Cancer Staging   Staging form: Breast, AJCC 8th Edition - Pathologic stage from 04/15/2020: No Stage Recommended (ypT2, pN2a, cM0, G2, ER+, PR+, HER2-) - Signed by Truitt Merle, MD on 05/27/2020  Stage prefix: Post-therapy  Histologic grading system: 3 grade system  Residual tumor (R): R1 - Microscopic    05/18/2020 Surgery   Right Mastectomy  A. BREAST, RIGHT, MASTECTOMY:  - Residual invasive carcinoma with mixed ductal and lobular features.       Residual invasive carcinoma broadly involves the anterior inferior  soft tissue margin.       Residual invasive carcinoma is focally < 1 mm from the deep margin.  - Residual ductal carcinoma in situ.       Residual DCIS is 3 mm from the closest margin, deep.  - Residual atypical lobular hyperplasia.  - Prior procedure site changes.  - Biopsy clip (X2).   B. BREAST IMPLANT, RIGHT, EXPLANTATION:  - Implant (gross only).   C. LYMPH NODES, RIGHT AXILLA, DISSECTION:  - Metastatic carcinoma in (2) of (12) lymph nodes.  - Prior procedure site changes in  surrounding soft tissue.    06/24/2020 -  Chemotherapy    Patient is on Treatment Plan: BREAST ADJUVANT DOSE DENSE AC Q14D / PACLITAXEL Q7D         CURRENT THERAPY: Adjuvant chemotherapy with dose dense AC every 2 weeks, started 06/24/2020  INTERVAL HISTORY: Caitlyn Miller is scheduled for a phone visit after her first dose chemotherapy last week.  I was not able to get hold of her yesterday, but finally talked to her husband, and rescheduled to today.  She tolerated first cycle chemotherapy poorly, with moderate fatigue, low appetite, mild to moderate intermittent nausea and a few episodes of vomiting, for which my nurse reviewed the management with her 2 days ago, nausea has improved.  However she has developed severe abdominal pain since 3 to 4 days ago, described as sharp epigastric pain, " my stomach is on fire", she has tried pantoprazole, sucralfate, but did not help.  Tylenol does not help the pain. She denies any fever, chills, or other new symptoms.  She has been eating small bland meals, bowel movement is normal.   All other systems were reviewed with the patient and are negative.  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   Pelvic pain 09/2008    SURGICAL HISTORY: Past Surgical History:  Procedure Laterality Date   AUGMENTATION MAMMAPLASTY Bilateral 9628   silicone    BREAST IMPLANT REMOVAL Right 05/18/2020   Procedure: REMOVAL OF RIGHT  BREAST IMPLANT;  Surgeon: Donnie Mesa, MD;  Location: [facility];  Service: General;  Laterality: Right;   BREAST LUMPECTOMY WITH RADIOACTIVE SEED LOCALIZATION Right 04/15/2020   Procedure: RIGHT BREAST LUMPECTOMY WITH RADIOACTIVE SEED LOCALIZATION;  Surgeon: Donnie Mesa, MD;  Location: [facility];  Service: General;  Laterality: Right;   MODIFIED MASTECTOMY Right 05/18/2020   Procedure: RIGHT MODIFIED RADICAL MASTECTOMY;  Surgeon: Donnie Mesa, MD;  Location: [facility];  Service: General;  Laterality: Right;   PORTACATH PLACEMENT Left 06/10/2020   Procedure: INSERTION PORT-A-CATH;  Surgeon: Donnie Mesa, MD;  Location: [facility];  Service: General;  Laterality: Left;  45 MINUTES ROOM 2   RADIOACTIVE SEED [place] Right 04/15/2020   Procedure: RADIOACTIVE SEED GUIDED RIGHT AXILLARY SENTINEL LYMPH NODE DISSECTION;  Surgeon: Donnie Mesa, MD;  Location: [facility];  Service: General;  Laterality: Right;   SENTINEL NODE BIOPSY N/A 04/15/2020   Procedure: SENTINEL NODE BIOPSY;  Surgeon: Donnie Mesa, MD;  Location: [facility];  Service: General;  Laterality: N/A;   WISDOM TOOTH EXTRACTION      I have reviewed the social history and family history with the patient and they are unchanged from previous note.  ALLERGIES:  is allergic to ibuprofen.  MEDICATIONS:  Current Outpatient Medications  Medication Sig Dispense Refill   LORazepam (ATIVAN) 0.5 MG tablet Take 1 tablet (0.5 mg total) by mouth every 8 (eight) hours as needed for anxiety. 20 tablet 0   magnesium oxide (MAG-OX) 400 (240 Mg) MG tablet Take 1 tablet (400 mg total) by mouth daily. 20 tablet 0   acetaminophen (TYLENOL) 500 MG tablet Take 500 mg by mouth every 6 (six) hours as needed for moderate pain.     anastrozole (ARIMIDEX) 1 MG tablet TAKE 1 TABLET BY MOUTH DAILY. (Patient taking differently: Take 1 mg by mouth at bedtime.) 30 tablet 3    clindamycin (CLINDAGEL) 1 % gel Apply topically 2 (two) times daily. (Patient not taking: No sig reported) 30 g 0   gabapentin (NEURONTIN) 100 MG capsule TAKE 2 CAPSULES(200 MG) BY MOUTH AT BEDTIME 60 capsule 1   lidocaine-prilocaine (EMLA) cream Apply to affected area once (Patient taking differently: Apply 1 application topically daily as needed (port access).) 30 g 3   ondansetron (ZOFRAN) 8 MG tablet Take 1 tablet (8 mg total) by mouth every 8 (eight) hours as needed. Start on the third day after chemotherapy. 30 tablet 2   pantoprazole (PROTONIX) 40 MG tablet Take 1 tablet (40 mg total) by mouth daily as needed. (Patient taking differently: Take 40 mg by mouth daily as needed (heartburn).) 30 tablet 5   prochlorperazine (COMPAZINE) 10 MG tablet Take 1 tablet (10 mg total) by  mouth every 6 (six) hours as needed for nausea or vomiting. 30 tablet 3   sucralfate (CARAFATE) 1 g tablet Take 1 tablet (1 g total) by mouth daily as needed (Upset stomach). 30 tablet 1   traMADol (ULTRAM) 50 MG tablet Take 1 tablet (50 mg total) by mouth every 6 (six) hours as needed (mild pain). (Patient not taking: No sig reported) 30 tablet 0   venlafaxine (EFFEXOR) 37.5 MG tablet TAKE 1 TABLET(37.5 MG) BY MOUTH TWICE DAILY (Patient taking differently: Take 37.5 mg by mouth daily.) 30 tablet 4   No current facility-administered medications for this visit.    PHYSICAL EXAMINATION: ECOG PERFORMANCE STATUS: 3 - Symptomatic, >50% confined to bed  There were no vitals filed for this visit.  Exam not performed   LABORATORY DATA:  I have reviewed the data as listed CBC Latest Ref Rng & Units 06/24/2020 06/17/2020 06/16/2020  WBC 4.0 - 10.5 K/uL 7.5 6.2 6.7  Hemoglobin 12.0 - 15.0 g/dL 11.4(L) 12.4 12.1  Hematocrit 36.0 - 46.0 % 34.2(L) 37.5 35.5(L)  Platelets 150 - 400 K/uL 239 231 217     CMP Latest Ref Rng & Units 06/24/2020 06/17/2020 06/16/2020  Glucose 70 - 99 mg/dL 84 101(H) 110(H)  BUN 6 - 20 mg/dL 11 11 10    Creatinine 0.44 - 1.00 mg/dL 0.70 0.58 0.66  Sodium 135 - 145 mmol/L 142 140 143  Potassium 3.5 - 5.1 mmol/L 4.2 4.2 4.1  Chloride 98 - 111 mmol/L 106 105 106  CO2 22 - 32 mmol/L 26 27 25   Calcium 8.9 - 10.3 mg/dL 9.7 9.9 9.6  Total Protein 6.5 - 8.1 g/dL 7.6 - 7.7  Total Bilirubin 0.3 - 1.2 mg/dL 0.3 - 0.3  Alkaline Phos 38 - 126 U/L 67 - 89  AST 15 - 41 U/L 35 - 50(H)  ALT 0 - 44 U/L 50(H) - 83(H)      RADIOGRAPHIC STUDIES: I have personally reviewed the radiological images as listed and agreed with the findings in the report. No results found.   ASSESSMENT & PLAN:  Caitlyn Miller is a 35 y.o. female with   1. Malignant neoplasm of upper-outer quadrant of right breast, Stage IIA, p(T2N1aM0), ER+/PR+/HER2-, Grade II, Mammaprint luminal type A, low risk -s/p first adjuvant chemo AC on 6/9  2. Intermittent nausea, low appetite and abdominal pain  -secondary to chemo  3. Chronic fatigue and body ache   Plan -She has developed severe abdominal pain, intermittent nausea, anorexia since chemo. I reviewed the management with her.  She will continue PPI, and will pick up the Maalox I called in yesterday.  I will call in tramadol for pain, constipation management and prevention reviewed with her.  -She appears to be extremely nervous, feels like she is going to die, her heart will stop.  She denies depression or suicidal.  She states she has a very sensitive stomach to oral medicines.  I reassured her some side effects are common, and she will recover.  I recommend her to increase Effexor to 75 mg twice daily, and I will call in Ativan 0.5 mg as needed for anxiety.  She use Neurontin at night for hot flashes.  I reviewed the sedative nature of the above medicine, and instructed her not to use together. -She is able to drink fluids adequately, not dehydrated. -She will return next week for cycle 2.  We discussed reduce dose, vs stopping AC and moving on to taxol if this is intolerable to  her.  Encouraged her to come in with her husband next week.  She agrees. -Continue bland diet, I encouraged her to take nutritional supplement, she has Ensure or boost.  No orders of the defined types were placed in this encounter.  I discussed the assessment and treatment plan with the patient. The patient was provided an opportunity to ask questions and all were answered. The patient agreed with the plan and demonstrated an understanding of the instructions.   The patient was advised to call back or seek an in-person evaluation if the symptoms worsen or if the condition fails to improve as anticipated.  I provided 22 minutes of non face-to-face telephone visit time during this encounter, and > 50% was spent counseling as documented under my assessment & plan.    Truitt Merle, MD 06/30/20

## 2020-06-30 ENCOUNTER — Encounter: Payer: Self-pay | Admitting: Hematology

## 2020-06-30 MED ORDER — LORAZEPAM 0.5 MG PO TABS: 0.5000 mg | ORAL_TABLET | Freq: Three times a day (TID) | ORAL | 0 refills | Status: DC | PRN

## 2020-07-01 ENCOUNTER — Encounter (HOSPITAL_COMMUNITY): Payer: Self-pay | Admitting: Emergency Medicine

## 2020-07-01 ENCOUNTER — Emergency Department (HOSPITAL_COMMUNITY)
Admission: EM | Admit: 2020-07-01 | Discharge: 2020-07-02 | Disposition: A | Discharge status: Home / Self Care | Payer: 59 | Attending: Emergency Medicine | Admitting: Emergency Medicine

## 2020-07-01 DIAGNOSIS — M791 Myalgia, unspecified site: Secondary | ICD-10-CM

## 2020-07-01 DIAGNOSIS — R109 Unspecified abdominal pain: Secondary | ICD-10-CM | POA: Insufficient documentation

## 2020-07-01 DIAGNOSIS — R5383 Other fatigue: Secondary | ICD-10-CM | POA: Insufficient documentation

## 2020-07-01 DIAGNOSIS — R1110 Vomiting, unspecified: Secondary | ICD-10-CM | POA: Insufficient documentation

## 2020-07-01 DIAGNOSIS — Z853 Personal history of malignant neoplasm of breast: Secondary | ICD-10-CM | POA: Diagnosis not present

## 2020-07-01 DIAGNOSIS — R112 Nausea with vomiting, unspecified: Secondary | ICD-10-CM

## 2020-07-01 MED ORDER — ONDANSETRON HCL 4 MG/2ML IJ SOLN
4.0000 mg | Freq: Once | INTRAMUSCULAR | Status: AC
  Administered 2020-07-02: 4 mg via INTRAVENOUS
  Filled 2020-07-01: qty 2

## 2020-07-01 MED ORDER — SODIUM CHLORIDE 0.9 % IV BOLUS
500.0000 mL | Freq: Once | INTRAVENOUS | Status: AC
  Administered 2020-07-02: 500 mL via INTRAVENOUS

## 2020-07-01 NOTE — ED Triage Notes (Signed)
Pt to ED from home with c/o bilateral flank pain. Pt states she took tylenol and gabapentin for the pain but woke up feeling exhausted and weak, still in pain. Pt is currently undergoing chemotherapy for breast cancer. Arrives A+O, obvious pain noted, VSS.

## 2020-07-02 ENCOUNTER — Encounter (HOSPITAL_COMMUNITY): Payer: Self-pay

## 2020-07-02 ENCOUNTER — Other Ambulatory Visit: Payer: Self-pay

## 2020-07-02 ENCOUNTER — Emergency Department (HOSPITAL_COMMUNITY): Payer: 59

## 2020-07-02 LAB — I-STAT CHEM 8, ED
BUN: 8 mg/dL (ref 6–20)
Calcium, Ion: 1.21 mmol/L (ref 1.15–1.40)
Chloride: 103 mmol/L (ref 98–111)
Creatinine, Ser: 0.4 mg/dL — ABNORMAL LOW (ref 0.44–1.00)
Glucose, Bld: 93 mg/dL (ref 70–99)
HCT: 29 % — ABNORMAL LOW (ref 36.0–46.0)
Hemoglobin: 9.9 g/dL — ABNORMAL LOW (ref 12.0–15.0)
Potassium: 4.1 mmol/L (ref 3.5–5.1)
Sodium: 138 mmol/L (ref 135–145)
TCO2: 29 mmol/L (ref 22–32)

## 2020-07-02 LAB — URINALYSIS, ROUTINE W REFLEX MICROSCOPIC
Bilirubin Urine: NEGATIVE
Glucose, UA: NEGATIVE mg/dL
Hgb urine dipstick: NEGATIVE
Ketones, ur: NEGATIVE mg/dL
Leukocytes,Ua: NEGATIVE
Nitrite: NEGATIVE
Protein, ur: NEGATIVE mg/dL
Specific Gravity, Urine: 1.01 (ref 1.005–1.030)
pH: 7 (ref 5.0–8.0)

## 2020-07-02 LAB — CBC WITH DIFFERENTIAL/PLATELET
Band Neutrophils: 5 %
Basophils Relative: 1 %
Blasts: NONE SEEN %
Eosinophils Relative: 2 %
HCT: 32.2 % — ABNORMAL LOW (ref 36.0–46.0)
Hemoglobin: 10.6 g/dL — ABNORMAL LOW (ref 12.0–15.0)
Lymphocytes Relative: 62 %
MCH: 31.2 pg (ref 26.0–34.0)
MCHC: 32.9 g/dL (ref 30.0–36.0)
MCV: 94.7 fL (ref 80.0–100.0)
Metamyelocytes Relative: NONE SEEN %
Monocytes Relative: 13 %
Myelocytes: NONE SEEN %
Neutrophils Relative %: 17 %
Platelets: 164 10*3/uL (ref 150–400)
Promyelocytes Relative: NONE SEEN %
RBC Morphology: NORMAL
RBC: 3.4 MIL/uL — ABNORMAL LOW (ref 3.87–5.11)
RDW: 11.7 % (ref 11.5–15.5)
WBC: 5.8 10*3/uL (ref 4.0–10.5)
nRBC: 0.3 % — ABNORMAL HIGH (ref 0.0–0.2)
nRBC: NONE SEEN /100 WBC

## 2020-07-02 LAB — I-STAT BETA HCG BLOOD, ED (MC, WL, AP ONLY): I-stat hCG, quantitative: <5 m[IU]/mL (ref <5)

## 2020-07-02 IMAGING — CT CT RENAL STONE PROTOCOL
2 of 4 series · 17 of 46 positions shown, 19 images · non-contrast
Comparison: None.

CLINICAL DATA: Bilateral flank pain.

EXAM:
CT ABDOMEN AND PELVIS WITHOUT CONTRAST
TECHNIQUE: Multidetector CT imaging of the abdomen and pelvis was performed
following the standard protocol without IV contrast.

[Series 2: axial st · axial · 0.65mm/px · z∈[+1228,+1608]mm · 14 of 84 slices shown, 16 images]
[im 4/84  soft-tissue]
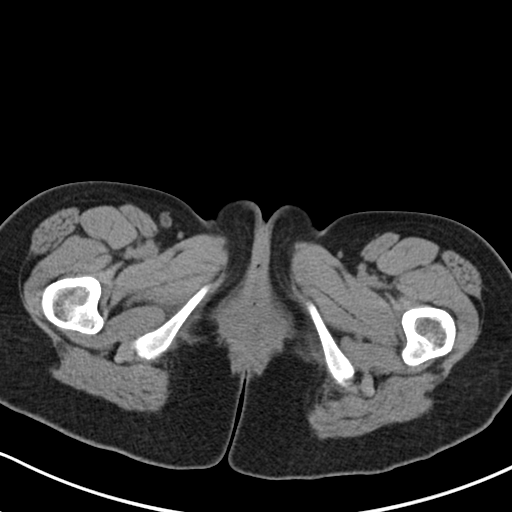
[im 4/84  bone]
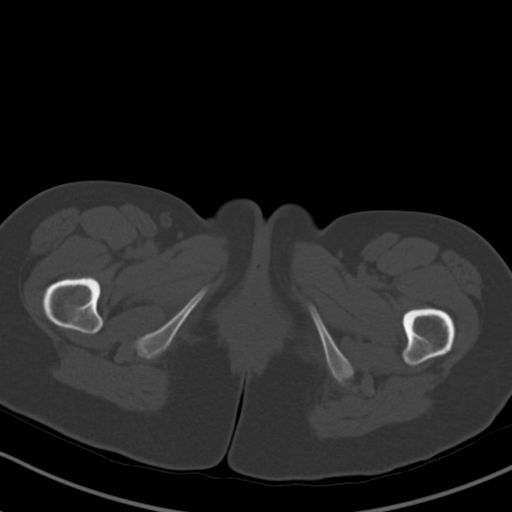
[im 12/84  soft-tissue]
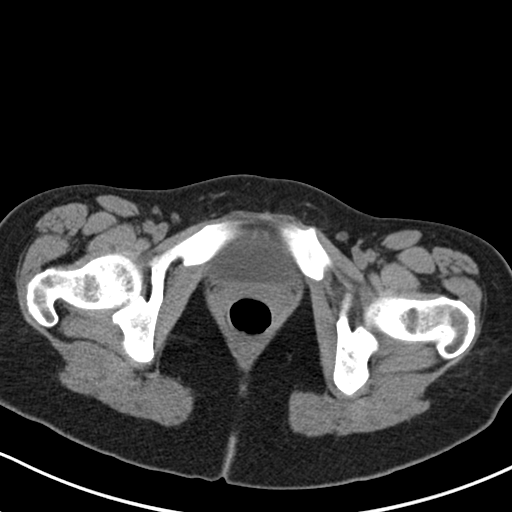
[im 16/84  soft-tissue]
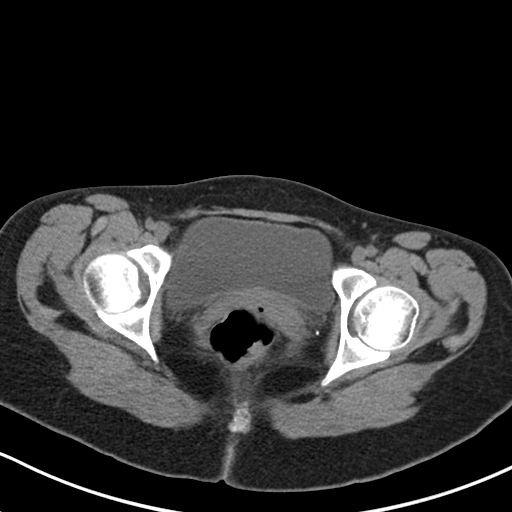
[im 23/84  soft-tissue]
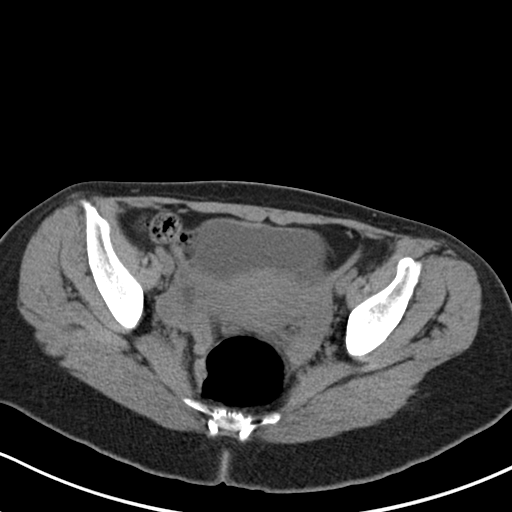
[im 27/84  soft-tissue]
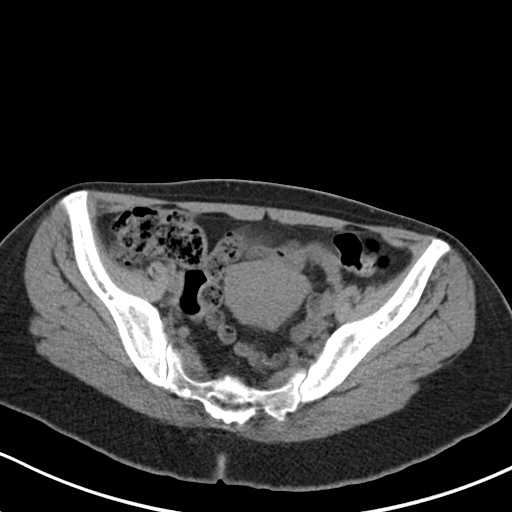
[im 34/84  soft-tissue]
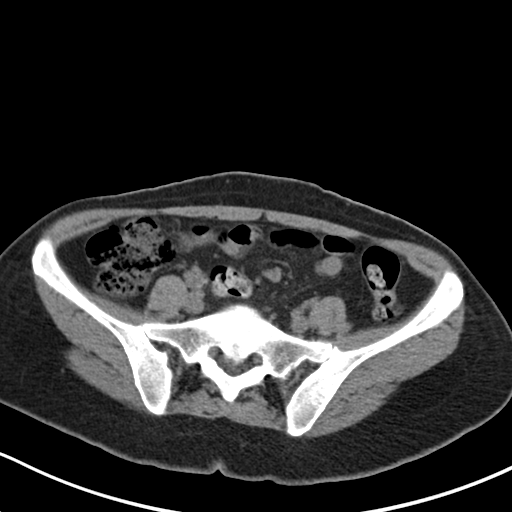
[im 38/84  soft-tissue]
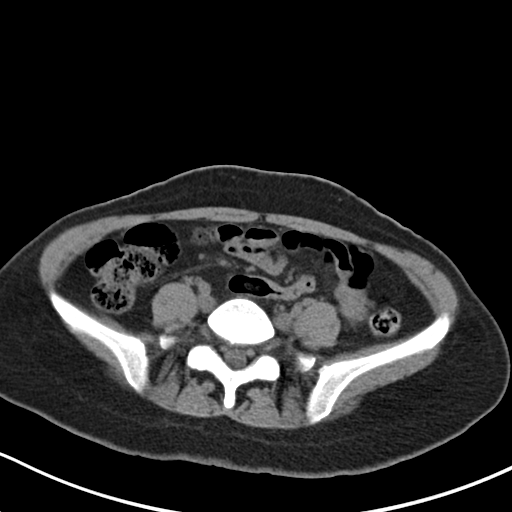
[im 46/84  soft-tissue]
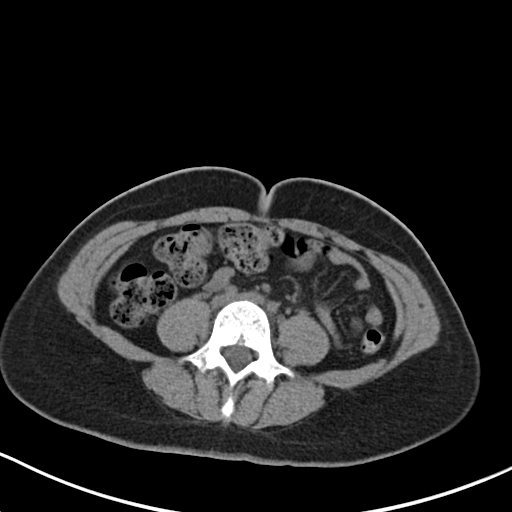
[im 50/84  soft-tissue]
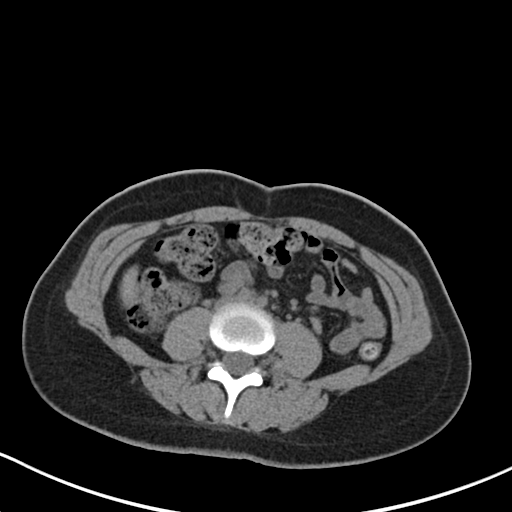
[im 50/84  bone]
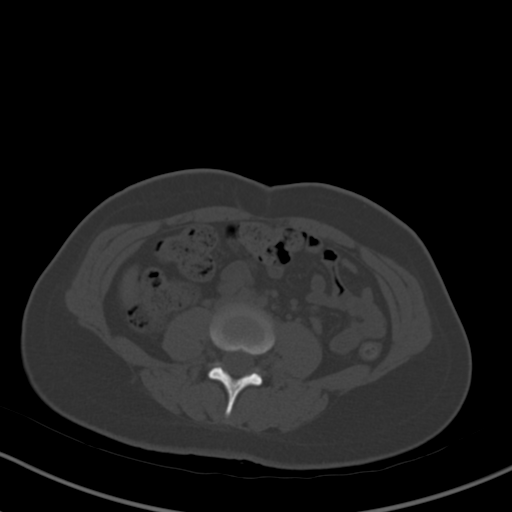
[im 57/84  soft-tissue]
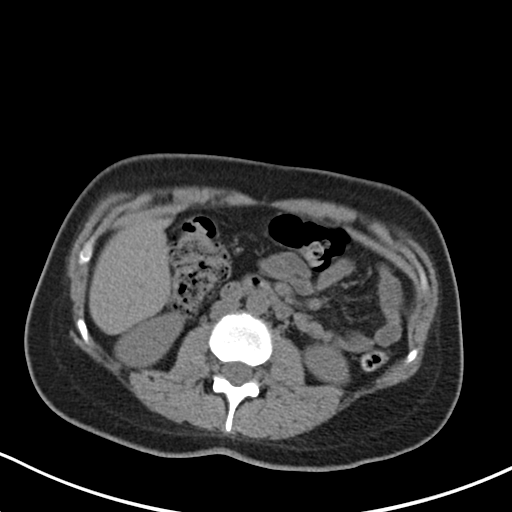
[im 61/84  soft-tissue]
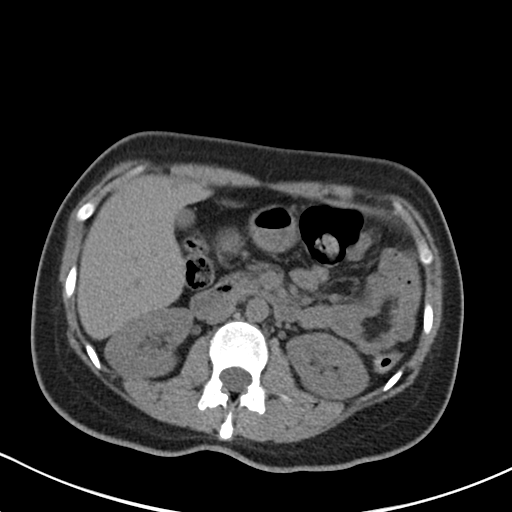
[im 68/84  soft-tissue]
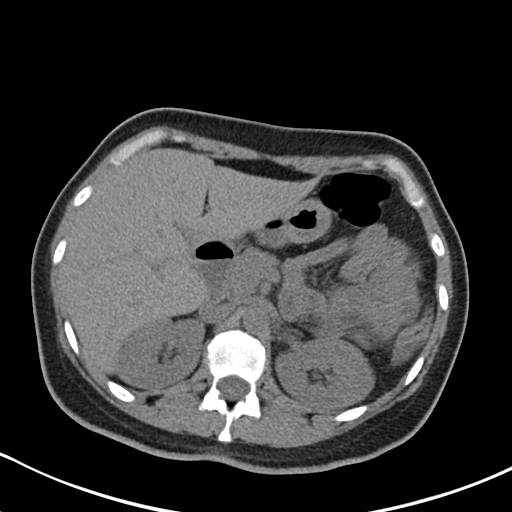
[im 72/84  soft-tissue]
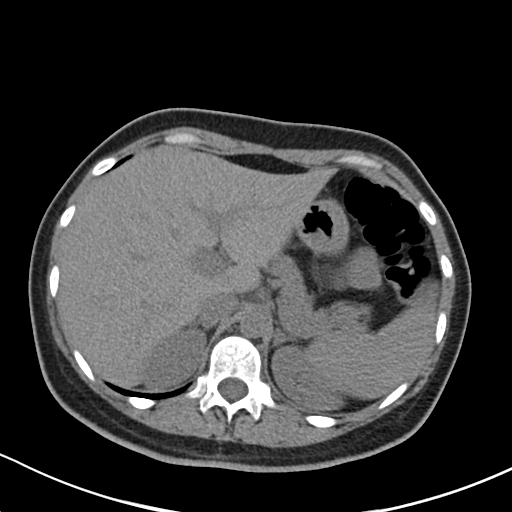
[im 80/84  soft-tissue]
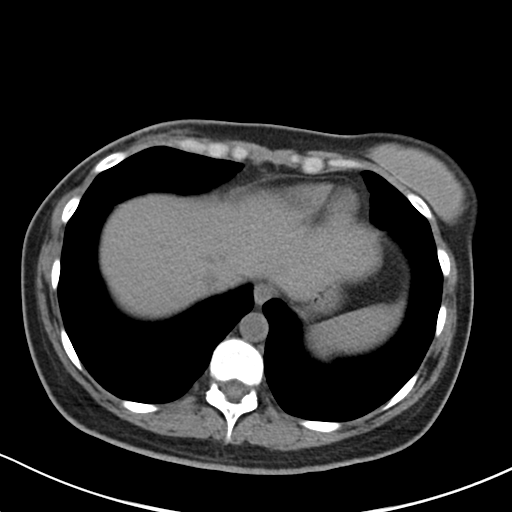

[Series 4: coronal · coronal · 0.68mm/px · 3 of 115 slices shown]
[im 39/115  soft-tissue]
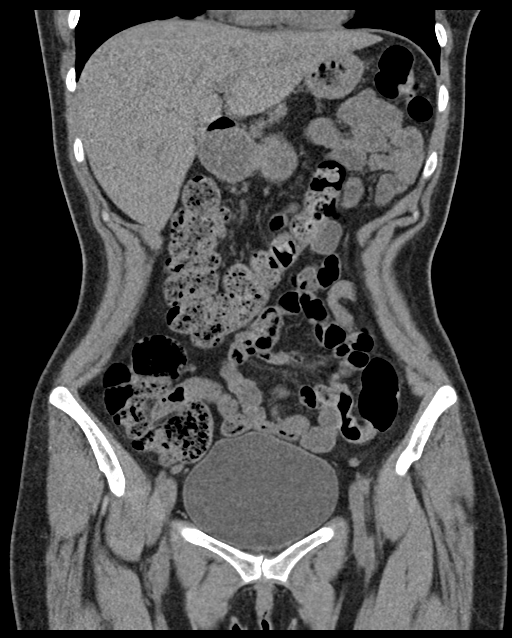
[im 51/115  soft-tissue]
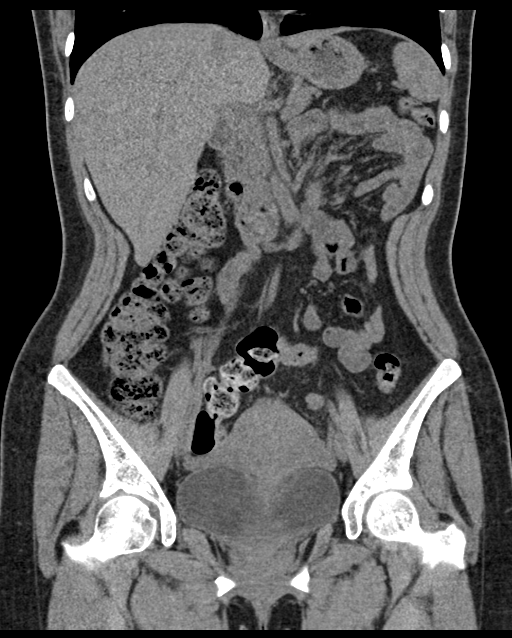
[im 64/115  soft-tissue]
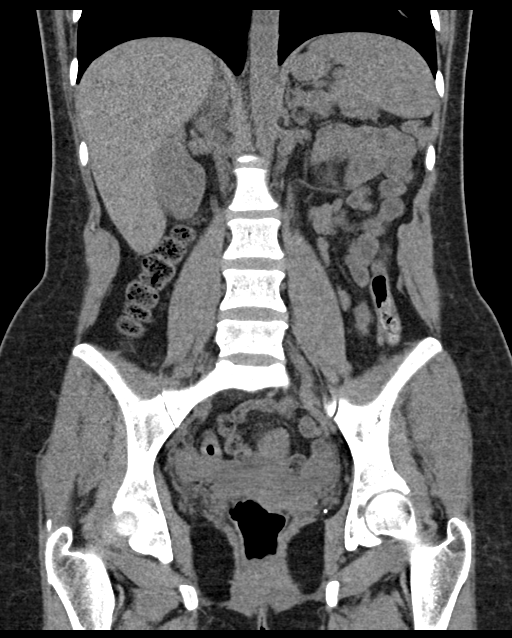

[17 of 46 positions shown; findings below may reference images not displayed]

FINDINGS: Lower chest: No acute abnormality.

Hepatobiliary: No focal hepatic abnormality. Gallbladder
unremarkable.

Pancreas: No focal abnormality or ductal dilatation.

Spleen: No focal abnormality.  Normal size.

Adrenals/Urinary Tract: No adrenal abnormality. No focal renal
abnormality. No stones or hydronephrosis. Urinary bladder is
unremarkable.

Stomach/Bowel: Stomach, large and small bowel grossly unremarkable.
Normal appendix containing small appendicolith.

Vascular/Lymphatic: No evidence of aneurysm or adenopathy.

Reproductive: Uterus and adnexa unremarkable.  No mass.

Other: No free fluid or free air.

Musculoskeletal: No acute bony abnormality.
IMPRESSION: No acute findings in the abdomen or pelvis.

## 2020-07-02 MED ORDER — ACETAMINOPHEN 500 MG PO TABS
1000.0000 mg | ORAL_TABLET | Freq: Once | ORAL | Status: AC
  Administered 2020-07-02: 1000 mg via ORAL
  Filled 2020-07-02: qty 2

## 2020-07-02 MED ORDER — KETOROLAC TROMETHAMINE 30 MG/ML IJ SOLN
15.0000 mg | Freq: Once | INTRAMUSCULAR | Status: AC
  Administered 2020-07-02: 15 mg via INTRAVENOUS
  Filled 2020-07-02: qty 1

## 2020-07-02 MED ORDER — PANTOPRAZOLE SODIUM 40 MG PO TBEC
40.0000 mg | DELAYED_RELEASE_TABLET | Freq: Once | ORAL | Status: AC
  Administered 2020-07-02: 40 mg via ORAL
  Filled 2020-07-02: qty 1

## 2020-07-02 MED ORDER — LIDOCAINE 5 % EX PTCH: 1.0000 | MEDICATED_PATCH | CUTANEOUS | 0 refills | Status: DC

## 2020-07-02 NOTE — ED Provider Notes (Signed)
[facility] DEPT Provider Note   CSN: [id number] Arrival date & time: 07/01/20  2336     History Chief Complaint  Patient presents with   Flank Pain    Caitlyn Miller is a 35 y.o. female.   Flank Pain This is a new problem. The current episode started 12 to 24 hours ago. The problem occurs constantly. The problem has not changed since onset.Pertinent negatives include no chest pain, no abdominal pain, no headaches and no shortness of breath. Nothing aggravates the symptoms. Nothing relieves the symptoms. She has tried nothing for the symptoms. The treatment provided no relief.  Patient who is on chemo for breast CA presents with a day of fatigue and then B flank pain.  No dysuria.  No diarrhea. Some vomiting.  No f/c/r.     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   Pelvic pain 09/2008    Patient Active Problem List   Diagnosis Date Noted   Menopausal syndrome (hot flushes) 23/55/7322   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   Genetic testing 11/26/2019   Family history of breast cancer 11/19/2019   Malignant neoplasm of upper-outer quadrant of right breast in female, estrogen receptor positive ([place]) 11/17/2019   S/P breast augmentation 09/18/2019   Hx of migraines 06/20/2011   GERD (gastroesophageal reflux disease) 06/20/2011   Anemia 06/20/2011    Past Surgical History:  Procedure Laterality Date    AUGMENTATION MAMMAPLASTY Bilateral 0623   silicone    BREAST IMPLANT REMOVAL Right 05/18/2020   Procedure: REMOVAL OF RIGHT BREAST IMPLANT;  Surgeon: Donnie Mesa, MD;  Location: [facility];  Service: General;  Laterality: Right;   BREAST LUMPECTOMY WITH RADIOACTIVE SEED LOCALIZATION Right 04/15/2020   Procedure: RIGHT BREAST LUMPECTOMY WITH RADIOACTIVE SEED LOCALIZATION;  Surgeon: Donnie Mesa, MD;  Location: [facility];  Service: General;  Laterality: Right;   MODIFIED MASTECTOMY Right 05/18/2020   Procedure: RIGHT MODIFIED RADICAL MASTECTOMY;  Surgeon: Donnie Mesa, MD;  Location: [facility];  Service: General;  Laterality: Right;   PORTACATH PLACEMENT Left 06/10/2020   Procedure: INSERTION PORT-A-CATH;  Surgeon: Donnie Mesa, MD;  Location: [facility];  Service: General;  Laterality: Left;  45 MINUTES ROOM 2   RADIOACTIVE SEED GUIDED AXILLARY SENTINEL LYMPH NODE Right 04/15/2020   Procedure: RADIOACTIVE SEED GUIDED RIGHT AXILLARY SENTINEL LYMPH NODE DISSECTION;  Surgeon: Donnie Mesa, MD;  Location: [facility];  Service: General;  Laterality: Right;   SENTINEL NODE BIOPSY N/A 04/15/2020   Procedure: SENTINEL NODE BIOPSY;  Surgeon: Donnie Mesa, MD;  Location: [facility];  Service: General;  Laterality: N/A;   WISDOM TOOTH EXTRACTION       OB History     Gravida  2   Para  2   Term  2  Preterm      AB      Living  2      SAB      IAB      Ectopic      Multiple      Live Births  2           Family History  Problem Relation Age of Onset   Migraines Mother    Hypertension Father    Breast cancer Paternal Aunt 64   Colon cancer Neg Hx    Stomach cancer Neg Hx     Social History   Tobacco Use   Smoking status: Never   Smokeless tobacco: Never  Vaping Use   Vaping Use: Never used  Substance Use Topics   Alcohol use: No   Drug use: No    Home Medications Prior to Admission medications    Medication Sig Start Date End Date Taking? Authorizing Provider  acetaminophen (TYLENOL) 500 MG tablet Take 500 mg by mouth every 6 (six) hours as needed for moderate pain.    [provider]  anastrozole (ARIMIDEX) 1 MG tablet TAKE 1 TABLET BY MOUTH DAILY. Patient taking differently: Take 1 mg by mouth at bedtime. 02/05/20 02/04/21  Alla Feeling, NP  clindamycin (CLINDAGEL) 1 % gel Apply topically 2 (two) times daily. Patient not taking: No sig reported 12/22/19   Truitt Merle, MD  gabapentin (NEURONTIN) 100 MG capsule TAKE 2 CAPSULES(200 MG) BY MOUTH AT BEDTIME 06/30/20   Truitt Merle, MD  lidocaine-prilocaine (EMLA) cream Apply to affected area once Patient taking differently: Apply 1 application topically daily as needed (port access). 05/27/20   Truitt Merle, MD  LORazepam (ATIVAN) 0.5 MG tablet Take 1 tablet (0.5 mg total) by mouth every 8 (eight) hours as needed for anxiety. 06/30/20   Truitt Merle, MD  magnesium oxide (MAG-OX) 400 (240 Mg) MG tablet Take 1 tablet (400 mg total) by mouth daily. 06/29/20   Truitt Merle, MD  ondansetron (ZOFRAN) 8 MG tablet Take 1 tablet (8 mg total) by mouth every 8 (eight) hours as needed. Start on the third day after chemotherapy. 06/28/20   Truitt Merle, MD  pantoprazole (PROTONIX) 40 MG tablet Take 1 tablet (40 mg total) by mouth daily as needed. Patient taking differently: Take 40 mg by mouth daily as needed (heartburn). 09/18/19   [name], Lilia Argue, MD  prochlorperazine (COMPAZINE) 10 MG tablet Take 1 tablet (10 mg total) by mouth every 6 (six) hours as needed for nausea or vomiting. 06/28/20   Truitt Merle, MD  sucralfate (CARAFATE) 1 g tablet Take 1 tablet (1 g total) by mouth daily as needed (Upset stomach). 06/04/20   Truitt Merle, MD  traMADol (ULTRAM) 50 MG tablet Take 1 tablet (50 mg total) by mouth every 6 (six) hours as needed (mild pain). Patient not taking: No sig reported 05/20/20   Donnie Mesa, MD  venlafaxine (EFFEXOR) 37.5 MG tablet TAKE 1 TABLET(37.5 MG)  BY MOUTH TWICE DAILY Patient taking differently: Take 37.5 mg by mouth daily. 04/05/20   Truitt Merle, MD    Allergies    Ibuprofen  Review of Systems   Review of Systems  Constitutional:  Negative for fever.  HENT:  Negative for facial swelling.   Eyes:  Negative for redness.  Respiratory:  Negative for shortness of breath.   Cardiovascular:  Negative for chest pain.  Gastrointestinal:  Positive for vomiting. Negative for abdominal pain and diarrhea.  Genitourinary:  Positive for flank pain. Negative  for dysuria and hematuria.  Musculoskeletal:  Negative for myalgias.  Skin:  Negative for rash.  Neurological:  Negative for headaches.  Psychiatric/Behavioral:  Negative for agitation.   All other systems reviewed and are negative.  Physical Exam Updated Vital Signs BP 117/88 (BP Location: Left Arm)   Pulse 66   Temp 98.1 F (36.7 C)   Resp 18   Ht _0  (1.626 m)   Wt 59 kg   SpO2 100%   BMI 22.33 kg/m   Physical Exam Vitals and nursing note reviewed.  Constitutional:      General: She is not in acute distress.    Appearance: Normal appearance.  HENT:     Head: Normocephalic and atraumatic.     Nose: Nose normal.  Eyes:     Conjunctiva/sclera: Conjunctivae normal.     Pupils: Pupils are equal, round, and reactive to light.  Cardiovascular:     Rate and Rhythm: Normal rate and regular rhythm.     Pulses: Normal pulses.     Heart sounds: Normal heart sounds.  Pulmonary:     Effort: Pulmonary effort is normal.     Breath sounds: Normal breath sounds.  Abdominal:     General: Abdomen is flat. Bowel sounds are normal.     Palpations: Abdomen is soft.     Tenderness: There is no abdominal tenderness. There is no guarding.  Musculoskeletal:        General: Normal range of motion.       Arms:     Cervical back: Normal range of motion and neck supple.  Skin:    General: Skin is warm and dry.     Capillary Refill: Capillary refill takes less than 2 seconds.   Neurological:     General: No focal deficit present.     Mental Status: She is alert and oriented to person, place, and time.     Deep Tendon Reflexes: Reflexes normal.  Psychiatric:        Mood and Affect: Mood normal.        Behavior: Behavior normal.    ED Results / Procedures / Treatments   Labs (all labs ordered are listed, but only abnormal results are displayed) Results for orders placed or performed during the hospital encounter of 07/01/20  CBC with Differential/Platelet  Result Value Ref Range   WBC 5.8 4.0 - 10.5 K/uL   RBC 3.40 (L) 3.87 - 5.11 MIL/uL   Hemoglobin 10.6 (L) 12.0 - 15.0 g/dL   HCT 32.2 (L) 36.0 - 46.0 %   MCV 94.7 80.0 - 100.0 fL   MCH 31.2 26.0 - 34.0 pg   MCHC 32.9 30.0 - 36.0 g/dL   RDW 11.7 11.5 - 15.5 %   Platelets 164 150 - 400 K/uL   nRBC 0.3 (H) 0.0 - 0.2 %   Neutrophils Relative % PENDING %   Neutro Abs PENDING 1.7 - 7.7 K/uL   Band Neutrophils PENDING %   Lymphocytes Relative PENDING %   Lymphs Abs PENDING 0.7 - 4.0 K/uL   Monocytes Relative PENDING %   Monocytes Absolute PENDING 0.1 - 1.0 K/uL   Eosinophils Relative PENDING %   Eosinophils Absolute PENDING 0.0 - 0.5 K/uL   Basophils Relative PENDING %   Basophils Absolute PENDING 0.0 - 0.1 K/uL   WBC Morphology PENDING    RBC Morphology PENDING    Smear Review PENDING    Other PENDING %   nRBC PENDING 0 /100 WBC  Metamyelocytes Relative PENDING %   Myelocytes PENDING %   Promyelocytes Relative PENDING %   Blasts PENDING %   Immature Granulocytes PENDING %   Abs Immature Granulocytes PENDING 0.00 - 0.07 K/uL  Urinalysis, Routine w reflex microscopic Urine, Clean Catch  Result Value Ref Range   Color, Urine YELLOW YELLOW   APPearance CLEAR CLEAR   Specific Gravity, Urine 1.010 1.005 - 1.030   pH 7.0 5.0 - 8.0   Glucose, UA NEGATIVE NEGATIVE mg/dL   Hgb urine dipstick NEGATIVE NEGATIVE   Bilirubin Urine NEGATIVE NEGATIVE   Ketones, ur NEGATIVE NEGATIVE mg/dL   Protein, ur  NEGATIVE NEGATIVE mg/dL   Nitrite NEGATIVE NEGATIVE   Leukocytes,Ua NEGATIVE NEGATIVE  I-Stat Beta hCG blood, ED ([facility], [facility], [facility] only)  Result Value Ref Range   I-stat hCG, quantitative <5.0 <5 mIU/mL   Comment 3          I-stat chem 8, ED (not at [facility] or [facility])  Result Value Ref Range   Sodium 138 135 - 145 mmol/L   Potassium 4.1 3.5 - 5.1 mmol/L   Chloride 103 98 - 111 mmol/L   BUN 8 6 - 20 mg/dL   Creatinine, Ser 0.40 (L) 0.44 - 1.00 mg/dL   Glucose, Bld 93 70 - 99 mg/dL   Calcium, Ion 1.21 1.15 - 1.40 mmol/L   TCO2 29 22 - 32 mmol/L   Hemoglobin 9.9 (L) 12.0 - 15.0 g/dL   HCT 29.0 (L) 36.0 - 46.0 %   CT Angio Chest PE W and/or Wo Contrast  Result Date: 06/17/2020 CLINICAL DATA:  Shortness of breath and elevated D-dimer. History of breast carcinoma EXAM: CT ANGIOGRAPHY CHEST WITH CONTRAST TECHNIQUE: Multidetector CT imaging of the chest was performed using the standard protocol during bolus administration of intravenous contrast. Multiplanar CT image reconstructions and MIPs were obtained to evaluate the vascular anatomy. CONTRAST:  18m OMNIPAQUE IOHEXOL 350 MG/ML SOLN COMPARISON:  Chest radiograph Jun 10, 2020 FINDINGS: Cardiovascular: There is no demonstrable pulmonary embolus. No thoracic aortic aneurysm or dissection. Visualized great vessels appear normal. Port-A-Cath tip is in the superior vena cava. No pericardial effusion or pericardial thickening. Mediastinum/Nodes: Visualized thyroid appears normal. There is no appreciable thoracic adenopathy. No esophageal lesions are evident. Lungs/Pleura: Lungs are clear. No pleural effusions. Trachea and major bronchial structures appear patent. No pneumothorax. Upper Abdomen: Visualized upper abdominal structures appear unremarkable. Musculoskeletal: Breast implant noted on the left. No blastic or lytic bone lesions. No chest wall lesions. Review of the MIP images confirms the above findings. IMPRESSION: 1. No demonstrable pulmonary embolus. No  thoracic aortic aneurysm or dissection. 2.  Lungs clear. 3.  No evident adenopathy. 4.  Port-A-Cath tip in superior vena cava. 5.  Status post left mastectomy with implant on the left. Electronically Signed   By: WLowella GripIII M.D.   On: 06/17/2020 14:33   DG CHEST PORT 1 VIEW  Result Date: 06/10/2020 CLINICAL DATA:  Port-A-Cath insertion.  History of breast cancer EXAM: PORTABLE CHEST 1 VIEW COMPARISON:  05/19/2020.  02/12/2018. FINDINGS: Port-A-Cath noted with tip over SVC. Heart size normal. Postsurgical changes right breast. Right chest drainage catheter is been removed. No pleural effusion or pneumothorax. Heart size normal. Stable small sclerotic foci proximal left humerus most likely bone island. No acute bony abnormality identified. IMPRESSION: 1.  PowerPort catheter noted with tip over SVC.  No pneumothorax. 2. Postsurgical changes right breast. Right chest drainage catheter has been removed. Electronically Signed   By: TMarcello [facility]  Register   On: 06/10/2020 09:12   DG Fluoro Guide CV Line-No Report  Result Date: 06/10/2020 Fluoroscopy was utilized by the requesting physician.  No radiographic interpretation.   ECHOCARDIOGRAM COMPLETE  Result Date: 06/11/2020    ECHOCARDIOGRAM REPORT   Patient Name:   TAKYRA CANTRALL Date of Exam: 06/11/2020 Medical Rec #:  [id number]     Height:       64.0 in Accession #:    [id number]    Weight:       126.8 lb Date of Birth:  1985-01-20      BSA:          1.612 m Patient Age:    11 years      BP:           120/77 mmHg Patient Gender: F             HR:           87 bpm. Exam Location:  Inpatient Procedure: 2D Echo, Cardiac Doppler, Color Doppler and Intracardiac            Opacification Agent Indications:    Chemo  History:        Patient has no prior history of Echocardiogram examinations.  Sonographer:    Cammy Brochure Referring Phys: [id number] Truitt Merle  Sonographer Comments: Suboptimal apical window. Image acquisition challenging due to breast implants.  IMPRESSIONS  1. Left ventricular ejection fraction, by estimation, is 60 to 65%. The left ventricle has normal function. The left ventricle has no regional wall motion abnormalities. Left ventricular diastolic parameters were normal.  2. Right ventricular systolic function is normal. The right ventricular size is normal. Tricuspid regurgitation signal is inadequate for assessing PA pressure.  3. The mitral valve is grossly normal. No evidence of mitral valve regurgitation. No evidence of mitral stenosis.  4. The aortic valve is grossly normal. Aortic valve regurgitation is not visualized. No aortic stenosis is present.  5. The inferior vena cava is normal in size with greater than 50% respiratory variability, suggesting right atrial pressure of 3 mmHg. Conclusion(s)/Recommendation(s): Normal biventricular function without evidence of hemodynamically significant valvular heart disease. FINDINGS  Left Ventricle: Left ventricular ejection fraction, by estimation, is 60 to 65%. The left ventricle has normal function. The left ventricle has no regional wall motion abnormalities. Definity contrast agent was given IV to delineate the left ventricular  endocardial borders. The left ventricular internal cavity size was normal in size. There is no left ventricular hypertrophy. Left ventricular diastolic parameters were normal. Right Ventricle: The right ventricular size is normal. No increase in right ventricular wall thickness. Right ventricular systolic function is normal. Tricuspid regurgitation signal is inadequate for assessing PA pressure. Left Atrium: Left atrial size was normal in size. Right Atrium: Right atrial size was normal in size. Pericardium: Trivial pericardial effusion is present. Mitral Valve: The mitral valve is grossly normal. No evidence of mitral valve regurgitation. No evidence of mitral valve stenosis. Tricuspid Valve: The tricuspid valve is grossly normal. Tricuspid valve regurgitation is not  demonstrated. No evidence of tricuspid stenosis. Aortic Valve: The aortic valve is grossly normal. Aortic valve regurgitation is not visualized. No aortic stenosis is present. Aortic valve mean gradient measures 5.0 mmHg. Aortic valve peak gradient measures 9.7 mmHg. Aortic valve area, by VTI measures 1.84 cm. Pulmonic Valve: The pulmonic valve was grossly normal. Pulmonic valve regurgitation is not visualized. No evidence of pulmonic stenosis. Aorta: The aortic root and ascending aorta are structurally normal, with  no evidence of dilitation. Venous: The inferior vena cava is normal in size with greater than 50% respiratory variability, suggesting right atrial pressure of 3 mmHg. IAS/Shunts: The atrial septum is grossly normal.  LEFT VENTRICLE PLAX 2D LVIDd:         3.90 cm  Diastology LVIDs:         2.70 cm  LV e' medial:    13.10 cm/s LV PW:         0.90 cm  LV E/e' medial:  6.3 LV IVS:        0.90 cm  LV e' lateral:   16.00 cm/s LVOT diam:     1.80 cm  LV E/e' lateral: 5.1 LV SV:         52 LV SV Index:   32 LVOT Area:     2.54 cm  RIGHT VENTRICLE             IVC RV S prime:     12.80 cm/s  IVC diam: 1.40 cm LEFT ATRIUM             Index LA diam:        2.90 cm 1.80 cm/m LA Vol (A2C):   48.5 ml 30.09 ml/m LA Vol (A4C):   46.3 ml 28.73 ml/m LA Biplane Vol: 48.2 ml 29.91 ml/m  AORTIC VALVE AV Area (Vmax):    1.75 cm AV Area (Vmean):   1.75 cm AV Area (VTI):     1.84 cm AV Vmax:           156.00 cm/s AV Vmean:          105.000 cm/s AV VTI:            0.281 m AV Peak Grad:      9.7 mmHg AV Mean Grad:      5.0 mmHg LVOT Vmax:         107.00 cm/s LVOT Vmean:        72.300 cm/s LVOT VTI:          0.203 m LVOT/AV VTI ratio: 0.72  AORTA Ao Root diam: 2.30 cm Ao Asc diam:  2.40 cm MITRAL VALVE MV Area (PHT): 4.68 cm    SHUNTS MV Decel Time: 162 msec    Systemic VTI:  0.20 m MV E velocity: 82.00 cm/s  Systemic Diam: 1.80 cm MV A velocity: 41.60 cm/s MV E/A ratio:  1.97 Eleonore Chiquito MD Electronically signed by  Eleonore Chiquito MD Signature Date/Time: 06/11/2020/4:37:27 PM    Final    CT Renal Stone Study  Result Date: 07/02/2020 CLINICAL DATA:  Bilateral flank pain. EXAM: CT ABDOMEN AND PELVIS WITHOUT CONTRAST TECHNIQUE: Multidetector CT imaging of the abdomen and pelvis was performed following the standard protocol without IV contrast. COMPARISON:  None. FINDINGS: Lower chest: No acute abnormality. Hepatobiliary: No focal hepatic abnormality. Gallbladder unremarkable. Pancreas: No focal abnormality or ductal dilatation. Spleen: No focal abnormality.  Normal size. Adrenals/Urinary Tract: No adrenal abnormality. No focal renal abnormality. No stones or hydronephrosis. Urinary bladder is unremarkable. Stomach/Bowel: Stomach, large and small bowel grossly unremarkable. Normal appendix containing small appendicolith. Vascular/Lymphatic: No evidence of aneurysm or adenopathy. Reproductive: Uterus and adnexa unremarkable.  No mass. Other: No free fluid or free air. Musculoskeletal: No acute bony abnormality. IMPRESSION: No acute findings in the abdomen or pelvis. Electronically Signed   By: Rolm Baptise M.D.   On: 07/02/2020 01:19    Radiology No results found.  Procedures Procedures   Medications Ordered in  ED Medications  ketorolac (TORADOL) 30 MG/ML injection 15 mg (has no administration in time range)  acetaminophen (TYLENOL) tablet 1,000 mg (has no administration in time range)  sodium chloride 0.9 % bolus 500 mL (500 mLs Intravenous New Bag/Given 07/02/20 0042)  ondansetron (ZOFRAN) injection 4 mg (4 mg Intravenous Given 07/02/20 0042)     ED Course  I have reviewed the triage vital signs and the nursing notes.  Pertinent labs & imaging results that were available during my care of the patient were reviewed by me and considered in my medical decision making (see chart for details).   No signs of infection of the urine.  No acute findings on CT.  Will start lidoderm for MSK pain and have patient follow  up with oncology for ongoing care.    Seleta Hovland was evaluated in Emergency Department on 07/02/2020 for the symptoms described in the history of present illness. She was evaluated in the context of the global COVID-19 pandemic, which necessitated consideration that the patient might be at risk for infection with the SARS-CoV-2 virus that causes COVID-19. Institutional protocols and algorithms that pertain to the evaluation of patients at risk for COVID-19 are in a state of rapid change based on information released by regulatory bodies including the CDC and federal and state organizations. These policies and algorithms were followed during the patient's care in the ED.   Final Clinical Impression(s) / ED Diagnoses Final diagnoses:  None   Return for intractable cough, coughing up blood, fevers > 100.4 unrelieved by medication, shortness of breath, intractable vomiting, chest pain, shortness of breath, weakness, numbness, changes in speech, facial asymmetry, abdominal pain, passing out, Inability to tolerate liquids or food, cough, altered mental status or any concerns. No signs of systemic illness or infection. The patient is nontoxic-appearing on exam and vital signs are within normal limits. I have reviewed the triage vital signs and the nursing notes. Pertinent labs & imaging results that were available during my care of the patient were reviewed by me and considered in my medical decision making (see chart for details). After history, exam, and medical workup I feel the patient has been appropriately medically screened and is safe for discharge home. Pertinent diagnoses were discussed with the patient. Patient was given return precautions.    Rx / DC Orders ED Discharge Orders     None        Mirel Hundal, MD 07/02/20 0126

## 2020-07-02 NOTE — ED Notes (Signed)
Patient discharged with husband. AVS and medications reviewed. No concerns at this time.

## 2020-07-05 ENCOUNTER — Telehealth: Payer: Self-pay

## 2020-07-05 NOTE — Telephone Encounter (Signed)
I spoke to Caitlyn Miller and let him know Dr Burr Medico will discuss changing chemo at [facility] appt on Thursday.  I have adjusted her schedule so that she is seen in clinic and not in infusion.

## 2020-07-05 NOTE — Telephone Encounter (Signed)
Rachid left vm stating that Caitlyn Miller is not tolerating chemotherapy.  He is requesting chemo be changed to something less aggressive.

## 2020-07-08 ENCOUNTER — Other Ambulatory Visit: Payer: Self-pay

## 2020-07-08 ENCOUNTER — Inpatient Hospital Stay (HOSPITAL_BASED_OUTPATIENT_CLINIC_OR_DEPARTMENT_OTHER): Payer: 59 | Admitting: Hematology

## 2020-07-08 ENCOUNTER — Inpatient Hospital Stay: Payer: 59

## 2020-07-08 ENCOUNTER — Encounter: Payer: Self-pay | Admitting: Hematology

## 2020-07-08 VITALS — BP 98/64 | HR 66 | Temp 97.1°F | Resp 18 | Ht 64.0 in | Wt 127.1 lb

## 2020-07-08 VITALS — BP 128/93 | HR 67

## 2020-07-08 DIAGNOSIS — C50411 Malignant neoplasm of upper-outer quadrant of right female breast: Secondary | ICD-10-CM | POA: Diagnosis not present

## 2020-07-08 DIAGNOSIS — Z170 Estrogen receptor positive status [ER+]: Secondary | ICD-10-CM

## 2020-07-08 DIAGNOSIS — Z5111 Encounter for antineoplastic chemotherapy: Secondary | ICD-10-CM | POA: Diagnosis not present

## 2020-07-08 DIAGNOSIS — Z95828 Presence of other vascular implants and grafts: Secondary | ICD-10-CM

## 2020-07-08 LAB — CMP (CANCER CENTER ONLY)
ALT: 144 U/L — ABNORMAL HIGH (ref 0–44)
AST: 77 U/L — ABNORMAL HIGH (ref 15–41)
Albumin: 4 g/dL (ref 3.5–5.0)
Alkaline Phosphatase: 153 U/L — ABNORMAL HIGH (ref 38–126)
Anion gap: 11 (ref 5–15)
BUN: 11 mg/dL (ref 6–20)
CO2: 26 mmol/L (ref 22–32)
Calcium: 9.8 mg/dL (ref 8.9–10.3)
Chloride: 104 mmol/L (ref 98–111)
Creatinine: 0.69 mg/dL (ref 0.44–1.00)
GFR, Estimated: >60 mL/min (ref >60)
Glucose, Bld: 90 mg/dL (ref 70–99)
Potassium: 3.9 mmol/L (ref 3.5–5.1)
Sodium: 141 mmol/L (ref 135–145)
Total Bilirubin: 0.3 mg/dL (ref 0.3–1.2)
Total Protein: 7.5 g/dL (ref 6.5–8.1)

## 2020-07-08 LAB — CBC WITH DIFFERENTIAL (CANCER CENTER ONLY)
Abs Immature Granulocytes: 1.09 10*3/uL — ABNORMAL HIGH (ref 0.00–0.07)
Basophils Absolute: 0.1 10*3/uL (ref 0.0–0.1)
Basophils Relative: 1 %
Eosinophils Absolute: 0 10*3/uL (ref 0.0–0.5)
Eosinophils Relative: 0 %
HCT: 33 % — ABNORMAL LOW (ref 36.0–46.0)
Hemoglobin: 11 g/dL — ABNORMAL LOW (ref 12.0–15.0)
Immature Granulocytes: 9 %
Lymphocytes Relative: 22 %
Lymphs Abs: 2.7 10*3/uL (ref 0.7–4.0)
MCH: 31.1 pg (ref 26.0–34.0)
MCHC: 33.3 g/dL (ref 30.0–36.0)
MCV: 93.2 fL (ref 80.0–100.0)
Monocytes Absolute: 1.2 10*3/uL — ABNORMAL HIGH (ref 0.1–1.0)
Monocytes Relative: 9 %
Neutro Abs: 7.5 10*3/uL (ref 1.7–7.7)
Neutrophils Relative %: 59 %
Platelet Count: 222 10*3/uL (ref 150–400)
RBC: 3.54 MIL/uL — ABNORMAL LOW (ref 3.87–5.11)
RDW: 12.3 % (ref 11.5–15.5)
WBC Count: 12.6 10*3/uL — ABNORMAL HIGH (ref 4.0–10.5)
nRBC: 0.2 % (ref 0.0–0.2)

## 2020-07-08 MED ORDER — SODIUM CHLORIDE 0.9% FLUSH
10.0000 mL | INTRAVENOUS | Status: AC | PRN
Start: 2020-07-08 — End: 2020-07-08
  Administered 2020-07-08: 10 mL
  Filled 2020-07-08: qty 10

## 2020-07-08 MED ORDER — SODIUM CHLORIDE 0.9 % IV SOLN
Freq: Once | INTRAVENOUS | Status: AC
  Filled 2020-07-08: qty 250

## 2020-07-08 MED ORDER — HEPARIN SOD (PORK) LOCK FLUSH 100 UNIT/ML IV SOLN
500.0000 [IU] | Freq: Once | INTRAVENOUS | Status: AC | PRN
Start: 2020-07-08 — End: 2020-07-08
  Administered 2020-07-08: 500 [IU]
  Filled 2020-07-08: qty 5

## 2020-07-08 MED ORDER — FAMOTIDINE 20 MG IN NS 100 ML IVPB
INTRAVENOUS | Status: AC
  Filled 2020-07-08: qty 100

## 2020-07-08 MED ORDER — FAMOTIDINE 20 MG IN NS 100 ML IVPB
20.0000 mg | Freq: Once | INTRAVENOUS | Status: AC
  Administered 2020-07-08: 20 mg via INTRAVENOUS

## 2020-07-08 MED ORDER — SODIUM CHLORIDE 0.9 % IV SOLN
10.0000 mg | Freq: Once | INTRAVENOUS | Status: AC
  Administered 2020-07-08: 10 mg via INTRAVENOUS
  Filled 2020-07-08: qty 10

## 2020-07-08 MED ORDER — SODIUM CHLORIDE 0.9% FLUSH
10.0000 mL | INTRAVENOUS | Status: DC | PRN
  Administered 2020-07-08: 10 mL
  Filled 2020-07-08: qty 10

## 2020-07-08 MED ORDER — SODIUM CHLORIDE 0.9 % IV SOLN
80.0000 mg/m2 | Freq: Once | INTRAVENOUS | Status: AC
  Administered 2020-07-08: 132 mg via INTRAVENOUS
  Filled 2020-07-08: qty 22

## 2020-07-08 NOTE — Patient Instructions (Signed)
[facility]  Discharge Instructions: Thank you for choosing [facility] to provide your oncology and hematology care.   If you have a lab appointment with the [facility], please go directly to the [facility] and check in at the registration area.   Wear comfortable clothing and clothing appropriate for easy access to any Portacath or PICC line.   We strive to give you quality time with your provider. You may need to reschedule your appointment if you arrive late (15 or more minutes).  Arriving late affects you and other patients whose appointments are after yours.  Also, if you miss three or more appointments without notifying the office, you may be dismissed from the clinic at the provider's discretion.      For prescription refill requests, have your pharmacy contact our office and allow 72 hours for refills to be completed.    Today you received the following chemotherapy and/or immunotherapy agents :Taxol     To help prevent nausea and vomiting after your treatment, we encourage you to take your nausea medication as directed.  BELOW ARE SYMPTOMS THAT SHOULD BE REPORTED IMMEDIATELY: *FEVER GREATER THAN 100.4 F (38 C) OR HIGHER *CHILLS OR SWEATING *NAUSEA AND VOMITING THAT IS NOT CONTROLLED WITH YOUR NAUSEA MEDICATION *UNUSUAL SHORTNESS OF BREATH *UNUSUAL BRUISING OR BLEEDING *URINARY PROBLEMS (pain or burning when urinating, or frequent urination) *BOWEL PROBLEMS (unusual diarrhea, constipation, pain near the anus) TENDERNESS IN MOUTH AND THROAT WITH OR WITHOUT PRESENCE OF ULCERS (sore throat, sores in mouth, or a toothache) UNUSUAL RASH, SWELLING OR PAIN  UNUSUAL VAGINAL DISCHARGE OR ITCHING   Items with * indicate a potential emergency and should be followed up as soon as possible or go to the Emergency Department if any problems should occur.  Please show the CHEMOTHERAPY ALERT CARD or IMMUNOTHERAPY ALERT CARD at check-in to the  Emergency Department and triage nurse.  Should you have questions after your visit or need to cancel or reschedule your appointment, please contact [facility]  Dept: [phone]  and follow the prompts.  Office hours are 8:00 a.m. to 4:30 p.m. Monday - Friday. Please note that voicemails left after 4:00 p.m. may not be returned until the following business day.  We are closed weekends and major holidays. You have access to a nurse at all times for urgent questions. Please call the main number to the clinic Dept: [phone] and follow the prompts.   For any non-urgent questions, you may also contact your provider using MyChart. We now offer e-Visits for anyone 10 and older to request care online for non-urgent symptoms. For details visit [email or web address].   Also download the MyChart app! Go to the app store, search "MyChart", open the app, select [facility], and log in with your MyChart username and password.  Due to Covid, a mask is required upon entering the hospital/clinic. If you do not have a mask, one will be given to you upon arrival. For doctor visits, patients may have 1 support person aged 37 or older with them. For treatment visits, patients cannot have anyone with them due to current Covid guidelines and our immunocompromised population.

## 2020-07-08 NOTE — Progress Notes (Signed)
Caitlyn Miller   [phone] [phone]   Clinic Follow up Note   Patient Care Team: [name], Lilia Argue, MD as PCP - General (Family Medicine) Mansouraty, Telford Nab., MD as Consulting Physician (Gastroenterology) Mauro Kaufmann, RN as Oncology Nurse Navigator Rockwell Germany, RN as Oncology Nurse Navigator Donnie Mesa, MD as Consulting Physician (General Surgery) Truitt Merle, MD as Consulting Physician (Hematology) Kyung Rudd, MD as Consulting Physician (Radiation Oncology) Contogiannis, Audrea Muscat, MD as Consulting Physician (Plastic Surgery)  Date of Service:  07/08/2020  CHIEF COMPLAINT: f/u of right breast cancer  SUMMARY OF ONCOLOGIC HISTORY: Oncology History Overview Note  Cancer Staging Malignant neoplasm of upper-outer quadrant of right breast in female, estrogen receptor positive ([place]) Staging form: Breast, AJCC 8th Edition - Clinical stage from 11/12/2019: Stage IIA (cT2, cN1, cM0, G2, ER+, PR+, HER2-) - Signed by Truitt Merle, MD on 11/19/2019 Stage prefix: Initial diagnosis Histologic grading system: 3 grade system - Pathologic stage from 04/15/2020: No Stage Recommended (ypT2, pN2a, cM0, G2, ER+, PR+, HER2-) - Signed by Truitt Merle, MD on 05/27/2020 Stage prefix: Post-therapy Histologic grading system: 3 grade system Residual tumor (R): R1 - Microscopic    Malignant neoplasm of upper-outer quadrant of right breast in female, estrogen receptor positive ([place])  11/05/2019 Mammogram   IMPRESSION: Large irregular palpable mass 4.7 x 1.4 x 1.4 cm within the upper-outer right breast 11 o'clock position, 4 cm from nipple.  There is a large area of associated coarse heterogeneous calcifications within the mass. Overall findings are concerning for breast carcinoma.   Two cortically thickened right axillary lymph nodes which are indeterminate in etiology.   11/12/2019 Cancer Staging   Staging form: Breast, AJCC 8th Edition - Clinical  stage from 11/12/2019: Stage IIA (cT2, cN1, cM0, G2, ER+, PR+, HER2-) - Signed by Truitt Merle, MD on 11/19/2019    11/12/2019 Initial Biopsy   Diagnosis 1. Breast, right, needle core biopsy, right - INVASIVE MAMMARY CARCINOMA, SEE COMMENT. - MAMMARY CARCINOMA IN SITU. 2. Lymph node, needle/core biopsy, right - METASTATIC MAMMARY CARCINOMA. Microscopic Comment 1. The carcinoma appears grade 2 and measures 16 mm in greatest linear extent. E-cadherin will be ordered. Prognostic makers will be ordered. Dr. Saralyn Pilar has reviewed the case. The case was called to [facility] on 01/13/2020.    1. E-cadherin is strongly positive consistent with a ductal phenotype.   11/12/2019 Receptors her2   1. PROGNOSTIC INDICATORS Results: IMMUNOHISTOCHEMICAL AND MORPHOMETRIC ANALYSIS PERFORMED MANUALLY The tumor cells are NEGATIVE for Her2 (0). Estrogen Receptor: 100%, POSITIVE, STRONG STAINING INTENSITY Progesterone Receptor: 100%, POSITIVE, STRONG STAINING INTENSITY Proliferation Marker Ki67: 5%   11/17/2019 Initial Diagnosis   Malignant neoplasm of upper-outer quadrant of right breast in female, estrogen receptor positive ([place])    11/25/2019 Breast MRI   IMPRESSION: 1. Large area of non-mass enhancement involving the UPPER OUTER QUADRANT of the RIGHT breast measuring approximately 4.2 x 4.1 x 2.4 cm. The biopsy-proven IDC and DCIS is present at the superomedial aspect of this NME. 2. No MRI evidence of malignancy involving the LEFT breast. 3. Intact BILATERAL retropectoral implants. 4. 2 pathologically enlarged RIGHT axillary lymph nodes. One of these nodes is biopsy-proven metastatic disease. No pathologic lymphadenopathy elsewhere   11/25/2019 PET scan   IMPRESSION: 1. Two mildly enlarged hypermetabolic right axillary lymph nodes compatible with right axillary nodal metastases. 2. No additional sites of hypermetabolic metastatic disease. 3. Asymmetric indistinct upper  right breast hypermetabolism, without discrete mass  correlate on the CT images, compatible with known primary right breast malignancy.     11/26/2019 Genetic Testing   Negative genetic testing: no pathogenic variants detected in [facility] STAT Breast Cancer Panel.  Variant of uncertain significance (VUS) detected in CHEK2 at c.1556G>T (p.Arg519Leu).  The report date is November 26, 2019.   The STAT Breast cancer panel offered by [facility] includes sequencing and rearrangement analysis for the following 9 genes:  ATM, BRCA1, BRCA2, CDH1, CHEK2, PALB2, PTEN, STK11 and TP53.    Results of [facility] Multi-Cancer Panel are pending.    12/01/2019 -  Anti-estrogen oral therapy   Neoadjuvant Tamoxifen 29m on 12/01/19. Reduced to 124mon 12/09/19.       ---Zoladex injection monthly starting 12/09/19.       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  Variants of uncertain significance detected RAD51D at c.715C>T (p.Arg239Trp) and CHEK2 at c.1556G>T (p.Arg519Leu).  The report date is December 08, 2019.   The Multi-Cancer Panel offered by [facility] includes sequencing and/or deletion duplication testing of the following 85 genes: AIP, ALK, APC, ATM, AXIN2,BAP1,  BARD1, BLM, BMPR1A, BRCA1, BRCA2, BRIP1, CASR, CDC73, CDH1, CDK4, CDKN1B, CDKN1C, CDKN2A (p14ARF), CDKN2A (p16INK4a), CEBPA, CHEK2, CTNNA1, DICER1, DIS3L2, EGFR (c.2369C>T, p.Thr790Met variant only), EPCAM (Deletion/duplication testing only), FH, FLCN, GATA2, GPC3, GREM1 (Promoter region deletion/duplication testing only), HOXB13 (c.251G>A, p.Gly84Glu), HRAS, KIT, MAX, MEN1, MET, MITF (c.952G>A, p.Glu318Lys variant only), MLH1, MSH2, MSH3, MSH6, MUTYH, NBN, NF1, NF2, NTHL1, PALB2, PDGFRA, PHOX2B, PMS2, POLD1, POLE, POT1, PRKAR1A, PTCH1, PTEN, RAD50, RAD51C, RAD51D, RB1, RECQL4, RET, RNF43, RUNX1,  SDHAF2, SDHA (sequence changes only), SDHB, SDHC, SDHD, SMAD4, SMARCA4, SMARCB1, SMARCE1, STK11, SUFU, TERC, TERT, TMEM127, TP53, TSC1, TSC2, VHL, WRN and WT1.    12/09/2019 Miscellaneous   Mammaprint luminal type A, low risk  10% risk of recurrence in 10 years with 97.8% benefor of hormaonal therapy alone.    01/12/2020 - 03/25/2020 Chemotherapy   Verzenio started on 01/12/2020, dose reduced to 5011mm and 100m14m11/2022 due to poor tolerance. Held Verzenio on 03/25/20 to proceed with breast surgery.    03/01/2020 Mammogram   Targeted ultrasound is performed, showing interval decreasing conspicuity of the patient's known right breast cancer. A post biopsy clip with an associated irregular area shadowing is demonstrated at the 11 o'clock position 4 cm from the nipple. Exact measurements are difficult due to the vague appearance of the findings. It measures approximately 1.2 x 0.9 x 0.6 cm (previously 4.7 x 2.2 x 1.5 cm).   IMPRESSION: Imaging findings consistent with good response to chemotherapy.   03/16/2020 Imaging   MRI Breast  IMPRESSION: 1. Interval resolution of RIGHT axillary adenopathy. 2. Slightly smaller area of non mass enhancement in the UPPER-OUTER QUADRANT of the RIGHT breast.   04/15/2020 Surgery   RIGHT BREAST LUMPECTOMY WITH RADIOACTIVE SEED LOCALIZATION and RADIOACTIVE SEED GUIDED RIGHT AXILLARY SENTINEL LYMPH NODE DISSECTION and SENTINEL NODE BIOPSY by Dr TsueGeorgette Dover3/31/2022 Pathology Results   FINAL MICROSCOPIC DIAGNOSIS:   A. 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  - Invasive carcinoma broadly involves each the anterior, posterior,  superior and inferior margins.  - DCIS involves the inferior margin and is < 1 mm from each  the anterior  and superior margins.  - Atypical lobular  hyperplasia.  - Biopsy sites.  - See oncology table.   B. TARGETED LYMPH NODE, RIGHT AXILLA, BIOPSY:  - Metastatic carcinoma in (1) of (1) lymph node.  - Biopsy site.   C. SENTINEL LYMPH NODE, RIGHT AXILLA #1, BIOPSY:  - Metastatic carcinoma in (1) of (1) lymph node.   D. SENTINEL LYMPH NODE, RIGHT AXILLA #2, BIOPSY:  - One lymph node, negative for carcinoma (0/1).    04/15/2020 Cancer Staging   Staging form: Breast, AJCC 8th Edition - Pathologic stage from 04/15/2020: No Stage Recommended (ypT2, pN2a, cM0, G2, ER+, PR+, HER2-) - Signed by Truitt Merle, MD on 05/27/2020  Stage prefix: Post-therapy  Histologic grading system: 3 grade system  Residual tumor (R): R1 - Microscopic    05/18/2020 Surgery   Right Mastectomy  A. BREAST, RIGHT, MASTECTOMY:  - Residual invasive carcinoma with mixed ductal and lobular features.       Residual invasive carcinoma broadly involves the anterior inferior  soft tissue margin.       Residual invasive carcinoma is focally < 1 mm from the deep margin.  - Residual ductal carcinoma in situ.       Residual DCIS is 3 mm from the closest margin, deep.  - Residual atypical lobular hyperplasia.  - Prior procedure site changes.  - Biopsy clip (X2).   B. BREAST IMPLANT, RIGHT, EXPLANTATION:  - Implant (gross only).   C. LYMPH NODES, RIGHT AXILLA, DISSECTION:  - Metastatic carcinoma in (2) of (12) lymph nodes.  - Prior procedure site changes in surrounding soft tissue.    06/24/2020 -  Chemotherapy    Patient is on Treatment Plan: BREAST ADJUVANT DOSE DENSE AC Q14D / PACLITAXEL Q7D          CURRENT THERAPY:  Taxol, starting 07/08/20  INTERVAL HISTORY:  Caitlyn Miller is here for a follow up of breast cancer. She was last seen by me in the office on 06/24/20, and I spoke with her on the phone on 06/30/20 following her first cycle of chemo. She presents to the clinic accompanied by her husband. She reports bilateral kidney pain, continuously for 3 days.  She notes she was also sweating profusely. She felt like she was dying. This led her to present to the ED on 07/02/20. She is drinking plenty of fluids, per her report. She expressed frustration with her continued chest/body pain, as well as her recent intense side effects. She has had workup previously, and everything is normal. She insists something feels wrong with her body.   All other systems were reviewed with the patient and are negative.  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   Pelvic pain 09/2008    SURGICAL HISTORY: Past Surgical History:  Procedure  Laterality Date   AUGMENTATION MAMMAPLASTY Bilateral 6644   silicone    BREAST IMPLANT REMOVAL Right 05/18/2020   Procedure: REMOVAL OF RIGHT BREAST IMPLANT;  Surgeon: Donnie Mesa, MD;  Location: [facility];  Service: General;  Laterality: Right;   BREAST LUMPECTOMY WITH RADIOACTIVE SEED LOCALIZATION Right 04/15/2020   Procedure: RIGHT BREAST LUMPECTOMY WITH RADIOACTIVE SEED LOCALIZATION;  Surgeon: Donnie Mesa, MD;  Location: [facility];  Service: General;  Laterality: Right;   MODIFIED MASTECTOMY Right 05/18/2020   Procedure: RIGHT MODIFIED RADICAL MASTECTOMY;  Surgeon: Donnie Mesa, MD;  Location: [facility];  Service: General;  Laterality: Right;   PORTACATH PLACEMENT Left 06/10/2020   Procedure: INSERTION PORT-A-CATH;  Surgeon: Donnie Mesa, MD;  Location: [facility];  Service: General;  Laterality: Left;  45 MINUTES ROOM 2   RADIOACTIVE SEED [place] Right 04/15/2020   Procedure: RADIOACTIVE SEED GUIDED RIGHT AXILLARY SENTINEL LYMPH NODE DISSECTION;  Surgeon: Donnie Mesa, MD;  Location: [facility];  Service: General;  Laterality: Right;   SENTINEL NODE BIOPSY N/A 04/15/2020   Procedure: SENTINEL NODE BIOPSY;  Surgeon: Donnie Mesa, MD;  Location: [facility];  Service: General;  Laterality: N/A;   WISDOM TOOTH EXTRACTION      I have reviewed the social history and family history with the patient and they are unchanged from previous note.  ALLERGIES:  is allergic to ibuprofen.  MEDICATIONS:  Current Outpatient Medications  Medication Sig Dispense Refill   acetaminophen (TYLENOL) 500 MG tablet Take 500 mg by mouth every 6 (six) hours as needed for moderate pain.     clindamycin (CLINDAGEL) 1 % gel Apply topically 2 (two) times daily. (Patient not taking: No sig reported) 30 g 0   gabapentin (NEURONTIN) 100 MG capsule TAKE 2 CAPSULES(200 MG) BY MOUTH AT BEDTIME 60 capsule 1   lidocaine (LIDODERM) 5 % Place 1 patch onto the skin daily. Remove & Discard patch within 12 hours or as directed by MD 30 patch 0   lidocaine-prilocaine (EMLA) cream Apply to affected area once (Patient taking differently: Apply 1 application topically daily as needed (port access).) 30 g 3   magnesium oxide (MAG-OX) 400 (240 Mg) MG tablet Take 1 tablet (400 mg total) by mouth daily. 20 tablet 0   ondansetron (ZOFRAN) 8 MG tablet Take 1 tablet (8 mg total) by mouth every 8 (eight) hours as needed. Start on the third day after chemotherapy. 30 tablet 2   pantoprazole (PROTONIX) 40 MG tablet Take 1 tablet (40 mg total) by mouth daily as needed. (Patient taking differently: Take 40 mg by mouth daily as needed (heartburn).) 30 tablet 5   prochlorperazine (COMPAZINE) 10 MG tablet Take 1 tablet (10 mg total) by mouth  every 6 (six) hours as needed for nausea or vomiting. 30 tablet 3   traMADol (ULTRAM) 50 MG tablet Take 1 tablet (50 mg total) by mouth every 6 (six) hours as needed (mild pain). 30 tablet 0   venlafaxine (EFFEXOR) 37.5 MG tablet TAKE 1 TABLET(37.5 MG) BY MOUTH TWICE DAILY (Patient taking differently: Take 37.5 mg by mouth daily.) 30 tablet 4   No current facility-administered medications for this visit.   Facility-Administered Medications Ordered in Other Visits  Medication Dose Route Frequency Provider Last Rate Last Admin   sodium chloride flush (NS) 0.9 % injection 10 mL  10 mL Intracatheter PRN Truitt Merle, MD   10 mL at 07/08/20 1631    PHYSICAL EXAMINATION:  ECOG PERFORMANCE STATUS: 2 - Symptomatic, <50% confined to bed  Vitals:   07/08/20 1233  BP: 98/64  Pulse: 66  Resp: 18  Temp: (!) 97.1 F (36.2 C)  SpO2: 99%   Filed Weights   07/08/20 1233  Weight: 127 lb 1.6 oz (57.7 kg)    Due to COVID19 we will limit examination to appearance. Patient had no complaints.  GENERAL:alert, no distress and comfortable SKIN: skin color normal, no rashes or significant lesions EYES: normal, Conjunctiva are pink and non-injected, sclera clear  NEURO: alert & oriented x 3 with fluent speech  LABORATORY DATA:  I have reviewed the data as listed CBC Latest Ref Rng & Units 07/08/2020 07/02/2020 07/02/2020  WBC 4.0 - 10.5 K/uL 12.6(H) - 5.8  Hemoglobin 12.0 - 15.0 g/dL 11.0(L) 9.9(L) 10.6(L)  Hematocrit 36.0 - 46.0 % 33.0(L) 29.0(L) 32.2(L)  Platelets 150 - 400 K/uL 222 - 164     CMP Latest Ref Rng & Units 07/08/2020 07/02/2020 06/24/2020  Glucose 70 - 99 mg/dL 90 93 84  BUN 6 - 20 mg/dL _0 Creatinine 0.44 - 1.00 mg/dL 0.69 0.40(L) 0.70  Sodium 135 - 145 mmol/L 141 138 142  Potassium 3.5 - 5.1 mmol/L 3.9 4.1 4.2  Chloride 98 - 111 mmol/L 104 103 106  CO2 22 - 32 mmol/L 26 - 26  Calcium 8.9 - 10.3 mg/dL 9.8 - 9.7  Total Protein 6.5 - 8.1 g/dL 7.5 - 7.6  Total Bilirubin 0.3 - 1.2  mg/dL 0.3 - 0.3  Alkaline Phos 38 - 126 U/L 153(H) - 67  AST 15 - 41 U/L 77(H) - 35  ALT 0 - 44 U/L 144(H) - 50(H)      RADIOGRAPHIC STUDIES: I have personally reviewed the radiological images as listed and agreed with the findings in the report. No results found.   ASSESSMENT & PLAN:  Caitlyn Miller is a 35 y.o. female with   1. Malignant neoplasm of upper-outer quadrant of right breast, Stage IIA, p(T2N1aM0), ER+/PR+/HER2-, Grade II, Mammaprint luminal type A, low risk -She has a 4.7cm right breast mass at 11:00 position and two abnormal right axillary LN. 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 -Her 11/2019 breast MRI and PET scan did not indicate left breast or distant malignancy or metastasis.  -Due to her large tumor, she needs mastectomy if she proceeded with surgery first.  She has been seen by Dr. Georgette Dover. Her mastectomy will be complicated by history of implant placement. -Mammaprint showed low risk disease, she would unlikely benefit from chemotherapy, and neoadjuvant chemotherapy is not recommended.  -I have started her on neoadjuvant tamoxifen on 12/01/19. She tolerated poorly with nausea, and diffuse body aches. Dose reduced to 23m from 12/09/19. I started her on monthly Zoladex injection on 12/09/19. Then switched Tamoxifen to Anastrozole on 01/06/20. -I started her on Verzenio on 01/12/2020, dose reduced to 576mam and 10016m/11/2020 due to poor tolerance. Held Verzenio on 03/25/20 to proceed with breast surgery. -Given her 03/01/20 [place] mammogram indicated good response to treatment, she proceeded with right lumpectomy and sentinel LN dissection on 04/15/20 with Dr TsuGeorgette Doverurgical pathology showed larger than expected tumor at 3.1cm of invasive carcinoma with mixed ductal and lobular features and DCIS components. She also had positive margins and 2/3 positive LNs. -She was  recommended to proceed with mastectomy and axillary node dissection due to her young age  and positive nodes after neoadjuvant treatment. This was performed on 05/18/20 under Dr. Georgette Dover showing: residual invasive carcinoma and DCIS. She again had a positive margin and another 2/12 positive nodes. She had stage III disease.  -Due to her N2 disease, I recommend adjuvant chemotherapy to reduce her risk of recurrence, followed by postmastectomy radiation to reduce her risk of local recurrence. -Recent CT scan (done for other reasons) was negative for metastatic disease. -She will continue Zoladex injection. She does not plan to have more children.  We discussed chemo-induced infertility. -She had first cycle AC on 06/24/20. She tolerated this poorly, with severe gastric pain, poor appetite, insomnia, etc.  She had a 2 ED visit since first cycle chemo.  Patient declined more chemo with [facility] after a length discussion.  -We will move to weekly Taxol starting today.  I again reviewed the potential side effect from Taxol, and reassured her that this is less intensive chemo, her body will tolerate better.  She agrees to proceed.   -I will see her next week prior to week 2 to see how she's doing.  2. Bilateral flank/kidney pain -following her first cycle of AC -she experienced continuous kidney pain for 3 days. -Her husband notes she was also sweating profusely. -She presented to the ED on 07/02/20 for this. Renal CT was negative.   3. Generalized Weakness, body pain, Fatigue -Pt notes for the past 3+ years she has had mid chest and mid back pain. Previously intermittent, now constant. She also notes b/l arm and leg weakness and pain along with tingling of her toes and fingers. -This has lead to fatigue, low appetite, decreased daily function/activity. -Pt notes prior workup for this in [place] was negative with Neurologist and cardiologist. They said this is related to stress, anxiety or depression. Pt feels she has  an illness causing this instead. -Her 11/25/19 PET was negative for any distant malignancy or metastasis.  -She expressed frustration with this again today. I discussed the possibility that this could be fibromyalgia, and she could see a rheumatologist to discuss this.   4. Anxiety, depression -Pt notes feeling more anxious (due to health before breast cancer), than depressed. -She was previously on Lexapro.  I switched her to Effexor, she is tolerating better  -I recommended she increase Effexor to twice daily on 07/04/20. She notes today she did not increase it. -I prescribed her lorazepam on 06/30/20. She notes she experienced constipation from this and stopped.   5. 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   -She has discussed with our genetic consoler    6. Social Support -She is from [place] and lives both there and in [place] with her husband. She has 2 teenage children. The rest of her family is in [place].      PLAN:  -Pt declined more AC -Proceed with C1 of weekly taxol today  -C2, labs, and f/u and week 2 Taxol in 1 week    No problem-specific Assessment & Plan notes found for this encounter.   No orders of the defined types were placed in this encounter.  All questions were answered. The patient knows to call the clinic with any problems, questions or concerns. No barriers to learning was detected. The total time spent in the appointment was 40 minutes.     Truitt Merle, MD 07/08/2020   I, Wilburn Mylar, am acting as scribe for Truitt Merle, MD.  I have reviewed the above documentation for accuracy and completeness, and I agree with the above.

## 2020-07-12 ENCOUNTER — Telehealth: Payer: Self-pay | Admitting: Hematology

## 2020-07-12 ENCOUNTER — Other Ambulatory Visit: Payer: Self-pay | Admitting: Hematology

## 2020-07-12 NOTE — Telephone Encounter (Signed)
Left message with follow-up appointment per 6/23 los.

## 2020-07-15 ENCOUNTER — Inpatient Hospital Stay (HOSPITAL_BASED_OUTPATIENT_CLINIC_OR_DEPARTMENT_OTHER): Payer: 59 | Admitting: Hematology

## 2020-07-15 ENCOUNTER — Inpatient Hospital Stay: Payer: 59

## 2020-07-15 ENCOUNTER — Other Ambulatory Visit: Payer: Self-pay

## 2020-07-15 ENCOUNTER — Encounter: Payer: Self-pay | Admitting: Hematology

## 2020-07-15 ENCOUNTER — Encounter: Payer: Self-pay | Admitting: Physical Therapy

## 2020-07-15 ENCOUNTER — Ambulatory Visit: Payer: 59 | Admitting: Physical Therapy

## 2020-07-15 VITALS — BP 133/72 | HR 85 | Temp 98.4°F | Resp 18 | Ht 64.0 in | Wt 128.4 lb

## 2020-07-15 DIAGNOSIS — C50411 Malignant neoplasm of upper-outer quadrant of right female breast: Secondary | ICD-10-CM

## 2020-07-15 DIAGNOSIS — Z5111 Encounter for antineoplastic chemotherapy: Secondary | ICD-10-CM | POA: Diagnosis not present

## 2020-07-15 DIAGNOSIS — Z170 Estrogen receptor positive status [ER+]: Secondary | ICD-10-CM

## 2020-07-15 DIAGNOSIS — M25611 Stiffness of right shoulder, not elsewhere classified: Secondary | ICD-10-CM

## 2020-07-15 DIAGNOSIS — R293 Abnormal posture: Secondary | ICD-10-CM

## 2020-07-15 DIAGNOSIS — Z483 Aftercare following surgery for neoplasm: Secondary | ICD-10-CM

## 2020-07-15 LAB — CMP (CANCER CENTER ONLY)
ALT: 83 U/L — ABNORMAL HIGH (ref 0–44)
AST: 56 U/L — ABNORMAL HIGH (ref 15–41)
Albumin: 3.9 g/dL (ref 3.5–5.0)
Alkaline Phosphatase: 90 U/L (ref 38–126)
Anion gap: 10 (ref 5–15)
BUN: 9 mg/dL (ref 6–20)
CO2: 25 mmol/L (ref 22–32)
Calcium: 9.8 mg/dL (ref 8.9–10.3)
Chloride: 105 mmol/L (ref 98–111)
Creatinine: 0.7 mg/dL (ref 0.44–1.00)
GFR, Estimated: >60 mL/min (ref >60)
Glucose, Bld: 95 mg/dL (ref 70–99)
Potassium: 4.1 mmol/L (ref 3.5–5.1)
Sodium: 140 mmol/L (ref 135–145)
Total Bilirubin: 0.3 mg/dL (ref 0.3–1.2)
Total Protein: 7.5 g/dL (ref 6.5–8.1)

## 2020-07-15 LAB — CBC WITH DIFFERENTIAL (CANCER CENTER ONLY)
Abs Immature Granulocytes: 0.04 10*3/uL (ref 0.00–0.07)
Basophils Absolute: 0.1 10*3/uL (ref 0.0–0.1)
Basophils Relative: 1 %
Eosinophils Absolute: 0 10*3/uL (ref 0.0–0.5)
Eosinophils Relative: 0 %
HCT: 30.3 % — ABNORMAL LOW (ref 36.0–46.0)
Hemoglobin: 10.4 g/dL — ABNORMAL LOW (ref 12.0–15.0)
Immature Granulocytes: 1 %
Lymphocytes Relative: 35 %
Lymphs Abs: 2.2 10*3/uL (ref 0.7–4.0)
MCH: 30.9 pg (ref 26.0–34.0)
MCHC: 34.3 g/dL (ref 30.0–36.0)
MCV: 89.9 fL (ref 80.0–100.0)
Monocytes Absolute: 0.6 10*3/uL (ref 0.1–1.0)
Monocytes Relative: 10 %
Neutro Abs: 3.4 10*3/uL (ref 1.7–7.7)
Neutrophils Relative %: 53 %
Platelet Count: 322 10*3/uL (ref 150–400)
RBC: 3.37 MIL/uL — ABNORMAL LOW (ref 3.87–5.11)
RDW: 12 % (ref 11.5–15.5)
WBC Count: 6.3 10*3/uL (ref 4.0–10.5)
nRBC: 0 % (ref 0.0–0.2)

## 2020-07-15 MED ORDER — PACLITAXEL CHEMO INJECTION 300 MG/50ML
80.0000 mg/m2 | Freq: Once | INTRAVENOUS | Status: AC
  Administered 2020-07-15: 132 mg via INTRAVENOUS
  Filled 2020-07-15: qty 22

## 2020-07-15 MED ORDER — SODIUM CHLORIDE 0.9 % IV SOLN
Freq: Once | INTRAVENOUS | Status: AC
  Filled 2020-07-15: qty 250

## 2020-07-15 MED ORDER — SODIUM CHLORIDE 0.9 % IV SOLN
10.0000 mg | Freq: Once | INTRAVENOUS | Status: AC
  Administered 2020-07-15: 10 mg via INTRAVENOUS
  Filled 2020-07-15: qty 10

## 2020-07-15 MED ORDER — FAMOTIDINE 20 MG IN NS 100 ML IVPB
20.0000 mg | Freq: Once | INTRAVENOUS | Status: AC
  Administered 2020-07-15: 20 mg via INTRAVENOUS

## 2020-07-15 MED ORDER — HEPARIN SOD (PORK) LOCK FLUSH 100 UNIT/ML IV SOLN
500.0000 [IU] | Freq: Once | INTRAVENOUS | Status: AC | PRN
  Administered 2020-07-15: 500 [IU]
  Filled 2020-07-15: qty 5

## 2020-07-15 MED ORDER — PANTOPRAZOLE SODIUM 40 MG PO TBEC: 40.0000 mg | DELAYED_RELEASE_TABLET | Freq: Every day | ORAL | 3 refills | Status: DC

## 2020-07-15 MED ORDER — SODIUM CHLORIDE 0.9% FLUSH
10.0000 mL | INTRAVENOUS | Status: DC | PRN
  Administered 2020-07-15: 10 mL
  Filled 2020-07-15: qty 10

## 2020-07-15 MED ORDER — FAMOTIDINE 20 MG IN NS 100 ML IVPB
INTRAVENOUS | Status: AC
  Filled 2020-07-15: qty 100

## 2020-07-15 NOTE — Progress Notes (Signed)
[place]   [phone] [phone]   Clinic Follow up Note   Patient Care Team: [name], Lilia Argue, MD as PCP - General (Family Medicine) Mansouraty, Telford Nab., MD as Consulting Physician (Gastroenterology) Mauro Kaufmann, RN as Oncology Nurse Navigator Rockwell Germany, RN as Oncology Nurse Navigator Donnie Mesa, MD as Consulting Physician (General Surgery) Truitt Merle, MD as Consulting Physician (Hematology) Kyung Rudd, MD as Consulting Physician (Radiation Oncology) Contogiannis, Audrea Muscat, MD as Consulting Physician (Plastic Surgery)  Date of Service:  07/15/2020  CHIEF COMPLAINT: f/u of right breast cancer  SUMMARY OF ONCOLOGIC HISTORY: Oncology History Overview Note  Cancer Staging Malignant neoplasm of upper-outer quadrant of right breast in female, estrogen receptor positive ([place]) Staging form: Breast, AJCC 8th Edition - Clinical stage from 11/12/2019: Stage IIA (cT2, cN1, cM0, G2, ER+, PR+, HER2-) - Signed by Truitt Merle, MD on 11/19/2019 Stage prefix: Initial diagnosis Histologic grading system: 3 grade system - Pathologic stage from 04/15/2020: No Stage Recommended (ypT2, pN2a, cM0, G2, ER+, PR+, HER2-) - Signed by Truitt Merle, MD on 05/27/2020 Stage prefix: Post-therapy Histologic grading system: 3 grade system Residual tumor (R): R1 - Microscopic    Malignant neoplasm of upper-outer quadrant of right breast in female, estrogen receptor positive ([place])  11/05/2019 Mammogram   IMPRESSION: Large irregular palpable mass 4.7 x 1.4 x 1.4 cm within the upper-outer right breast 11 o'clock position, 4 cm from nipple.  There is a large area of associated coarse heterogeneous calcifications within the mass. Overall findings are concerning for breast carcinoma.   Two cortically thickened right axillary lymph nodes which are indeterminate in etiology.   11/12/2019 Cancer Staging   Staging form: Breast, AJCC 8th Edition - Clinical  stage from 11/12/2019: Stage IIA (cT2, cN1, cM0, G2, ER+, PR+, HER2-) - Signed by Truitt Merle, MD on 11/19/2019    11/12/2019 Initial Biopsy   Diagnosis 1. Breast, right, needle core biopsy, right - INVASIVE MAMMARY CARCINOMA, SEE COMMENT. - MAMMARY CARCINOMA IN SITU. 2. Lymph node, needle/core biopsy, right - METASTATIC MAMMARY CARCINOMA. Microscopic Comment 1. The carcinoma appears grade 2 and measures 16 mm in greatest linear extent. E-cadherin will be ordered. Prognostic makers will be ordered. Dr. Saralyn Pilar has reviewed the case. The case was called to [facility] on 01/13/2020.    1. E-cadherin is strongly positive consistent with a ductal phenotype.   11/12/2019 Receptors her2   1. PROGNOSTIC INDICATORS Results: IMMUNOHISTOCHEMICAL AND MORPHOMETRIC ANALYSIS PERFORMED MANUALLY The tumor cells are NEGATIVE for Her2 (0). Estrogen Receptor: 100%, POSITIVE, STRONG STAINING INTENSITY Progesterone Receptor: 100%, POSITIVE, STRONG STAINING INTENSITY Proliferation Marker Ki67: 5%   11/17/2019 Initial Diagnosis   Malignant neoplasm of upper-outer quadrant of right breast in female, estrogen receptor positive ([place])    11/25/2019 Breast MRI   IMPRESSION: 1. Large area of non-mass enhancement involving the UPPER OUTER QUADRANT of the RIGHT breast measuring approximately 4.2 x 4.1 x 2.4 cm. The biopsy-proven IDC and DCIS is present at the superomedial aspect of this NME. 2. No MRI evidence of malignancy involving the LEFT breast. 3. Intact BILATERAL retropectoral implants. 4. 2 pathologically enlarged RIGHT axillary lymph nodes. One of these nodes is biopsy-proven metastatic disease. No pathologic lymphadenopathy elsewhere   11/25/2019 PET scan   IMPRESSION: 1. Two mildly enlarged hypermetabolic right axillary lymph nodes compatible with right axillary nodal metastases. 2. No additional sites of hypermetabolic metastatic disease. 3. Asymmetric indistinct upper  right breast hypermetabolism, without discrete mass  correlate on the CT images, compatible with known primary right breast malignancy.     11/26/2019 Genetic Testing   Negative genetic testing: no pathogenic variants detected in [facility] STAT Breast Cancer Panel.  Variant of uncertain significance (VUS) detected in CHEK2 at c.1556G>T (p.Arg519Leu).  The report date is November 26, 2019.   The STAT Breast cancer panel offered by [facility] includes sequencing and rearrangement analysis for the following 9 genes:  ATM, BRCA1, BRCA2, CDH1, CHEK2, PALB2, PTEN, STK11 and TP53.    Results of [facility] Multi-Cancer Panel are pending.    12/01/2019 -  Anti-estrogen oral therapy   Neoadjuvant Tamoxifen 29m on 12/01/19. Reduced to 124mon 12/09/19.       ---Zoladex injection monthly starting 12/09/19.       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  Variants of uncertain significance detected RAD51D at c.715C>T (p.Arg239Trp) and CHEK2 at c.1556G>T (p.Arg519Leu).  The report date is December 08, 2019.   The Multi-Cancer Panel offered by [facility] includes sequencing and/or deletion duplication testing of the following 85 genes: AIP, ALK, APC, ATM, AXIN2,BAP1,  BARD1, BLM, BMPR1A, BRCA1, BRCA2, BRIP1, CASR, CDC73, CDH1, CDK4, CDKN1B, CDKN1C, CDKN2A (p14ARF), CDKN2A (p16INK4a), CEBPA, CHEK2, CTNNA1, DICER1, DIS3L2, EGFR (c.2369C>T, p.Thr790Met variant only), EPCAM (Deletion/duplication testing only), FH, FLCN, GATA2, GPC3, GREM1 (Promoter region deletion/duplication testing only), HOXB13 (c.251G>A, p.Gly84Glu), HRAS, KIT, MAX, MEN1, MET, MITF (c.952G>A, p.Glu318Lys variant only), MLH1, MSH2, MSH3, MSH6, MUTYH, NBN, NF1, NF2, NTHL1, PALB2, PDGFRA, PHOX2B, PMS2, POLD1, POLE, POT1, PRKAR1A, PTCH1, PTEN, RAD50, RAD51C, RAD51D, RB1, RECQL4, RET, RNF43, RUNX1,  SDHAF2, SDHA (sequence changes only), SDHB, SDHC, SDHD, SMAD4, SMARCA4, SMARCB1, SMARCE1, STK11, SUFU, TERC, TERT, TMEM127, TP53, TSC1, TSC2, VHL, WRN and WT1.    12/09/2019 Miscellaneous   Mammaprint luminal type A, low risk  10% risk of recurrence in 10 years with 97.8% benefor of hormaonal therapy alone.    01/12/2020 - 03/25/2020 Chemotherapy   Verzenio started on 01/12/2020, dose reduced to 5011mm and 100m14m11/2022 due to poor tolerance. Held Verzenio on 03/25/20 to proceed with breast surgery.    03/01/2020 Mammogram   Targeted ultrasound is performed, showing interval decreasing conspicuity of the patient's known right breast cancer. A post biopsy clip with an associated irregular area shadowing is demonstrated at the 11 o'clock position 4 cm from the nipple. Exact measurements are difficult due to the vague appearance of the findings. It measures approximately 1.2 x 0.9 x 0.6 cm (previously 4.7 x 2.2 x 1.5 cm).   IMPRESSION: Imaging findings consistent with good response to chemotherapy.   03/16/2020 Imaging   MRI Breast  IMPRESSION: 1. Interval resolution of RIGHT axillary adenopathy. 2. Slightly smaller area of non mass enhancement in the UPPER-OUTER QUADRANT of the RIGHT breast.   04/15/2020 Surgery   RIGHT BREAST LUMPECTOMY WITH RADIOACTIVE SEED LOCALIZATION and RADIOACTIVE SEED GUIDED RIGHT AXILLARY SENTINEL LYMPH NODE DISSECTION and SENTINEL NODE BIOPSY by Dr TsueGeorgette Dover3/31/2022 Pathology Results   FINAL MICROSCOPIC DIAGNOSIS:   A. 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  - Invasive carcinoma broadly involves each the anterior, posterior,  superior and inferior margins.  - DCIS involves the inferior margin and is < 1 mm from each  the anterior  and superior margins.  - Atypical lobular  hyperplasia.  - Biopsy sites.  - See oncology table.   B. TARGETED LYMPH NODE, RIGHT AXILLA, BIOPSY:  - Metastatic carcinoma in (1) of (1) lymph node.  - Biopsy site.   C. SENTINEL LYMPH NODE, RIGHT AXILLA #1, BIOPSY:  - Metastatic carcinoma in (1) of (1) lymph node.   D. SENTINEL LYMPH NODE, RIGHT AXILLA #2, BIOPSY:  - One lymph node, negative for carcinoma (0/1).    04/15/2020 Cancer Staging   Staging form: Breast, AJCC 8th Edition - Pathologic stage from 04/15/2020: No Stage Recommended (ypT2, pN2a, cM0, G2, ER+, PR+, HER2-) - Signed by Truitt Merle, MD on 05/27/2020  Stage prefix: Post-therapy  Histologic grading system: 3 grade system  Residual tumor (R): R1 - Microscopic    05/18/2020 Surgery   Right Mastectomy  A. BREAST, RIGHT, MASTECTOMY:  - Residual invasive carcinoma with mixed ductal and lobular features.       Residual invasive carcinoma broadly involves the anterior inferior  soft tissue margin.       Residual invasive carcinoma is focally < 1 mm from the deep margin.  - Residual ductal carcinoma in situ.       Residual DCIS is 3 mm from the closest margin, deep.  - Residual atypical lobular hyperplasia.  - Prior procedure site changes.  - Biopsy clip (X2).   B. BREAST IMPLANT, RIGHT, EXPLANTATION:  - Implant (gross only).   C. LYMPH NODES, RIGHT AXILLA, DISSECTION:  - Metastatic carcinoma in (2) of (12) lymph nodes.  - Prior procedure site changes in surrounding soft tissue.    06/24/2020 -  Chemotherapy    Patient is on Treatment Plan: BREAST ADJUVANT DOSE DENSE AC Q14D / PACLITAXEL Q7D          CURRENT THERAPY:  Taxol, q7d, starting 07/08/20  INTERVAL HISTORY:  Clairessa Boulet is here for a follow up of breast cancer. She was last seen by me on 07/08/20. She presents to the clinic alone. She notes she tolerated this treatment better than her last. She still has continued body pain, but it is overall better compared to during her first regimen. She notes  continued occasional numbness. She uses ice baths as directed. She still has frequent hot flashes, but she is able to sleep.  All other systems were reviewed with the patient and are negative.  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   Pelvic pain 09/2008    SURGICAL HISTORY: Past Surgical History:  Procedure Laterality Date   AUGMENTATION MAMMAPLASTY Bilateral 6333   silicone    BREAST IMPLANT REMOVAL Right 05/18/2020   Procedure: REMOVAL OF RIGHT BREAST IMPLANT;  Surgeon: Donnie Mesa, MD;  Location: [place];  Service: General;  Laterality: Right;   BREAST LUMPECTOMY  WITH RADIOACTIVE SEED LOCALIZATION Right 04/15/2020   Procedure: RIGHT BREAST LUMPECTOMY WITH RADIOACTIVE SEED LOCALIZATION;  Surgeon: Donnie Mesa, MD;  Location: [place];  Service: General;  Laterality: Right;   MODIFIED MASTECTOMY Right 05/18/2020   Procedure: RIGHT MODIFIED RADICAL MASTECTOMY;  Surgeon: Donnie Mesa, MD;  Location: [place];  Service: General;  Laterality: Right;   PORTACATH PLACEMENT Left 06/10/2020   Procedure: INSERTION PORT-A-CATH;  Surgeon: Donnie Mesa, MD;  Location: [place];  Service: General;  Laterality: Left;  45 MINUTES ROOM 2   RADIOACTIVE SEED GUIDED AXILLARY SENTINEL LYMPH NODE Right 04/15/2020   Procedure: RADIOACTIVE SEED GUIDED RIGHT AXILLARY SENTINEL LYMPH NODE DISSECTION;  Surgeon: Donnie Mesa, MD;   Location: [place];  Service: General;  Laterality: Right;   SENTINEL NODE BIOPSY N/A 04/15/2020   Procedure: SENTINEL NODE BIOPSY;  Surgeon: Donnie Mesa, MD;  Location: [place];  Service: General;  Laterality: N/A;   WISDOM TOOTH EXTRACTION      I have reviewed the social history and family history with the patient and they are unchanged from previous note.  ALLERGIES:  is allergic to ibuprofen.  MEDICATIONS:  Current Outpatient Medications  Medication Sig Dispense Refill   acetaminophen (TYLENOL) 500 MG tablet Take 500 mg by mouth every 6 (six) hours as needed for moderate pain.     clindamycin (CLINDAGEL) 1 % gel Apply topically 2 (two) times daily. (Patient not taking: No sig reported) 30 g 0   gabapentin (NEURONTIN) 100 MG capsule TAKE 2 CAPSULES(200 MG) BY MOUTH AT BEDTIME 60 capsule 1   lidocaine (LIDODERM) 5 % Place 1 patch onto the skin daily. Remove & Discard patch within 12 hours or as directed by MD 30 patch 0   lidocaine-prilocaine (EMLA) cream Apply to affected area once (Patient taking differently: Apply 1 application topically daily as needed (port access).) 30 g 3   magnesium oxide (MAG-OX) 400 (240 Mg) MG tablet Take 1 tablet (400 mg total) by mouth daily. 20 tablet 0   ondansetron (ZOFRAN) 8 MG tablet Take 1 tablet (8 mg total) by mouth every 8 (eight) hours as needed. Start on the third day after chemotherapy. 30 tablet 2   pantoprazole (PROTONIX) 40 MG tablet Take 1 tablet (40 mg total) by mouth daily. 30 tablet 3   prochlorperazine (COMPAZINE) 10 MG tablet Take 1 tablet (10 mg total) by mouth every 6 (six) hours as needed for nausea or vomiting. 30 tablet 3   traMADol (ULTRAM) 50 MG tablet Take 1 tablet (50 mg total) by mouth every 6 (six) hours as needed (mild pain). 30 tablet 0   venlafaxine (EFFEXOR) 37.5 MG tablet Take 1 tablet (37.5 mg total) by mouth daily. 30 tablet 30   No current facility-administered medications for this  visit.   Facility-Administered Medications Ordered in Other Visits  Medication Dose Route Frequency Provider Last Rate Last Admin   sodium chloride flush (NS) 0.9 % injection 10 mL  10 mL Intracatheter PRN Truitt Merle, MD   10 mL at 07/15/20 1708    PHYSICAL EXAMINATION: ECOG PERFORMANCE STATUS: 2 - Symptomatic, <50% confined to bed  Vitals:   07/15/20 1339  BP: 133/72  Pulse: 85  Resp: 18  Temp: 98.4 F (36.9 C)  SpO2: 100%   Filed Weights   07/15/20 1339  Weight: 128 lb 6.4 oz (58.2 kg)    Due to COVID19 we will limit examination to appearance. Patient had no complaints.  GENERAL:alert,  no distress and comfortable SKIN: skin color normal, no rashes or significant lesions EYES: normal, Conjunctiva are pink and non-injected, sclera clear  NEURO: alert & oriented x 3 with fluent speech  LABORATORY DATA:  I have reviewed the data as listed CBC Latest Ref Rng & Units 07/15/2020 07/08/2020 07/02/2020  WBC 4.0 - 10.5 K/uL 6.3 12.6(H) -  Hemoglobin 12.0 - 15.0 g/dL 10.4(L) 11.0(L) 9.9(L)  Hematocrit 36.0 - 46.0 % 30.3(L) 33.0(L) 29.0(L)  Platelets 150 - 400 K/uL 322 222 -     CMP Latest Ref Rng & Units 07/15/2020 07/08/2020 07/02/2020  Glucose 70 - 99 mg/dL 95 90 93  BUN 6 - 20 mg/dL _0 Creatinine 0.44 - 1.00 mg/dL 0.70 0.69 0.40(L)  Sodium 135 - 145 mmol/L 140 141 138  Potassium 3.5 - 5.1 mmol/L 4.1 3.9 4.1  Chloride 98 - 111 mmol/L 105 104 103  CO2 22 - 32 mmol/L 25 26 -  Calcium 8.9 - 10.3 mg/dL 9.8 9.8 -  Total Protein 6.5 - 8.1 g/dL 7.5 7.5 -  Total Bilirubin 0.3 - 1.2 mg/dL 0.3 0.3 -  Alkaline Phos 38 - 126 U/L 90 153(H) -  AST 15 - 41 U/L 56(H) 77(H) -  ALT 0 - 44 U/L 83(H) 144(H) -      RADIOGRAPHIC STUDIES: I have personally reviewed the radiological images as listed and agreed with the findings in the report. No results found.   ASSESSMENT & PLAN:  Caitlyn Miller is a 35 y.o. female with   1. Malignant neoplasm of upper-outer quadrant of right breast,  Stage IIA, p(T2N1aM0), ER+/PR+/HER2-, Grade II, Mammaprint luminal type A, low risk -She has a 4.7cm right breast mass at 11:00 position and two abnormal right axillary LN. 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 -Her 11/2019 breast MRI and PET scan did not indicate left breast or distant malignancy or metastasis.  -Due to her large tumor, she needs mastectomy if she proceeded with surgery first.  She has been seen by Dr. Georgette Dover. Her mastectomy will be complicated by history of implant placement. -Mammaprint showed low risk disease, she would unlikely benefit from chemotherapy, and neoadjuvant chemotherapy is not recommended.  -I have started her on neoadjuvant tamoxifen on 12/01/19. She tolerated poorly with nausea, and diffuse body aches. Dose reduced to 68m from 12/09/19. I started her on monthly Zoladex injection on 12/09/19. Then switched Tamoxifen to Anastrozole on 01/06/20. -I started her on Verzenio on 01/12/2020, dose reduced to 515mam and 10030m/11/2020 due to poor tolerance. Held Verzenio on 03/25/20 to proceed with breast surgery. -Given her 03/01/20 [place] mammogram indicated good response to treatment, she proceeded with right lumpectomy and sentinel LN dissection on 04/15/20 with Dr TsuGeorgette Doverurgical pathology showed larger than expected tumor at 3.1cm of invasive carcinoma with mixed ductal and lobular features and DCIS components. She also had positive margins and 2/3 positive LNs. -She was recommended to proceed with mastectomy and axillary node dissection due to her young age and positive nodes after neoadjuvant treatment. This was performed on 05/18/20 under Dr. TsuGeorgette Doverowing: residual invasive carcinoma and DCIS. She again had a positive margin and another 2/12 positive nodes. She had stage III disease.  -Due to her N2 disease, I recommend adjuvant chemotherapy to reduce her risk of recurrence,  followed by postmastectomy radiation to reduce her risk of local recurrence. -Recent CT scan (done for other  reasons) was negative for metastatic disease. -She will continue Zoladex injection. She does not plan to have more children.  We discussed chemo-induced infertility. -She had first cycle AC on 06/24/20. She tolerated this poorly, with severe gastric pain, poor appetite, insomnia, etc.  She had a 2 ED visit since first cycle chemo.  Patient declined more chemo with [facility] after a lengthy discussion. -We moved to weekly Taxol on 07/08/20. She tolerated her first cycle much better than AC.  -Lab reviewed, adequate for treatment, will proceed week to Taxol today at the same dose  2. Digestive issues, reflux -She is on pantoprazole. Will continue  -She reports issues with digestion today, feeling like the food gets stuck in her stomach. She notes this only occurs after taking the pantoprazole. -She asked about supplements and if they would be okay to take. I reassured her that these would be fine.   3. Generalized Weakness, body pain, Fatigue -Pt notes for the past 3+ years she has had mid chest and mid back pain. Previously intermittent, now constant. She also notes b/l arm and leg weakness and pain along with tingling of her toes and fingers. -This has lead to fatigue, low appetite, decreased daily function/activity. -Pt notes prior workup for this in [place] was negative with Neurologist and cardiologist. They said this is related to stress, anxiety or depression. Pt feels she has an illness causing this instead. -Her 11/25/19 PET was negative for any distant malignancy or metastasis.  -This is improved some since stopping AC treatment.    4. Anxiety, depression -Pt notes feeling more anxious (due to health before breast cancer), than depressed. -She was previously on Lexapro.  I switched her to Effexor, she is tolerating better  -I previously discussed increasing Effexor to twice daily, but she  opted against this.   5. 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   -She has discussed with our genetic consoler    6. Social Support -She is from [place] and lives both there and in [place] with her husband. She has 2 teenage children. The rest of her family is in [place].  -She notes her mother is living with her now. Her mother is able to help her, especially with her kids.     PLAN:  -Proceed with C2 of weekly taxol today  -Labs, f/u, and Taxol every 7 days. She will see NP Lacie next week -I refilled her pantoprazole today. -I wrote her a letter for her mother's visa extension    No problem-specific Assessment & Plan notes found for this encounter.   No orders of the defined types were placed in this encounter.  All questions were answered. The patient knows to call the clinic with any problems, questions or concerns. No barriers to learning was detected. The total time spent in the appointment was 30 minutes.     Truitt Merle, MD 07/15/2020   I, Wilburn Mylar, am acting as scribe for Truitt Merle, MD.   I have reviewed the above documentation for accuracy and completeness, and I agree with the above.

## 2020-07-15 NOTE — Therapy (Signed)
[place], [place], [address] Phone: [phone]   Fax:  [phone]  Physical Therapy Treatment  Patient Details  Name: Caitlyn Miller MRN: [id number] Date of Birth: May 18, 1985 Referring Provider (PT): Dr. Rodman Key Tsuei/Feng   Encounter Date: 07/15/2020   PT End of Session - 07/15/20 0957     Visit Number 4    Number of Visits 7    Date for PT Re-Evaluation 08/26/20    PT Start Time 0917   pt arrived late   PT Stop Time 0947    PT Time Calculation (min) 30 min    Activity Tolerance Patient tolerated treatment well    Behavior During Therapy [facility] for tasks assessed/performed             Past Medical History:  Diagnosis Date   Abnormal Pap smear    Acid reflux    Anemia    Anxiety    Breast cancer ([place])    Decreased appetite 10/08   Depression    Endometriosis 10/2004   Epigastric pain 10/2006   Family history of breast cancer 11/19/2019   FH: migraines    GBS carrier    H/O amenorrhea 06/2006   H/O dyspareunia 7/07   H/O fatigue    H/O nausea and vomiting 10/2006   H/O rubella    H/O varicella    Hyperemesis arising during pregnancy    First pregnancy   Irregular periods/menstrual cycles 02/3534   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   Pelvic pain 09/2008    Past Surgical History:  Procedure Laterality Date   AUGMENTATION MAMMAPLASTY Bilateral 4008   silicone    BREAST IMPLANT REMOVAL Right 05/18/2020   Procedure: REMOVAL OF RIGHT BREAST IMPLANT;  Surgeon: Donnie Mesa, MD;  Location: [place];  Service: General;  Laterality: Right;   BREAST LUMPECTOMY WITH RADIOACTIVE SEED LOCALIZATION Right 04/15/2020   Procedure: RIGHT BREAST LUMPECTOMY WITH RADIOACTIVE SEED LOCALIZATION;  Surgeon: Donnie Mesa, MD;  Location: [place];  Service: General;  Laterality: Right;   MODIFIED MASTECTOMY Right 05/18/2020   Procedure: RIGHT MODIFIED RADICAL MASTECTOMY;  Surgeon:  Donnie Mesa, MD;  Location: [place];  Service: General;  Laterality: Right;   PORTACATH PLACEMENT Left 06/10/2020   Procedure: INSERTION PORT-A-CATH;  Surgeon: Donnie Mesa, MD;  Location: [place];  Service: General;  Laterality: Left;  45 MINUTES ROOM 2   RADIOACTIVE SEED GUIDED AXILLARY SENTINEL LYMPH NODE Right 04/15/2020   Procedure: RADIOACTIVE SEED GUIDED RIGHT AXILLARY SENTINEL LYMPH NODE DISSECTION;  Surgeon: Donnie Mesa, MD;  Location: [place];  Service: General;  Laterality: Right;   SENTINEL NODE BIOPSY N/A 04/15/2020   Procedure: SENTINEL NODE BIOPSY;  Surgeon: Donnie Mesa, MD;  Location: [place];  Service: General;  Laterality: N/A;   WISDOM TOOTH EXTRACTION      There were no vitals filed for this visit.   Subjective Assessment - 07/15/20 0916     Subjective The chemo is hard. I just had my second chemo and I have to do it once a week. I am having nausea. I have been doing some therapy at home so my ROM is getting better day by day. I had three cords in my right arm.    Pertinent History Patient was diagnosed on 11/05/2019 with right grade II invasive ductal carcinoma breast cancer. It measures 4.7 cm and is located in the upper outer quadrant. It is ER/PR positive and HER2  negative with a Ki67 of 5%. She has a biopsied positive axillary lymph node. She had a Right lumpectomy with SLNB (2/3+) performed on 04/15/2020,  right modified mastectomy ALND (2/12), currently doing chemo and will need radiation    Patient Stated Goals Get back to normal use of right UE    Currently in Pain? No/denies    Pain Score 0-No pain                [facility] PT Assessment - 07/15/20 0001       Assessment   Prior Therapy baselines      Precautions   Precautions Other (comment)    Precaution Comments at risk for lymphedema      Restrictions   Weight Bearing Restrictions No      Balance Screen   Has the patient fallen in the past  6 months No    Has the patient had a decrease in activity level because of a fear of falling?  Yes   since starting chemo bc pt feels dizzy   Is the patient reluctant to leave their home because of a fear of falling?  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    Leisure pt not currently exercising      Cognition   Overall Cognitive Status Within Functional Limits for tasks assessed      AROM   Right Shoulder Extension 53 Degrees    Right Shoulder Flexion 153 Degrees    Right Shoulder ABduction 170 Degrees    Right Shoulder Internal Rotation 69 Degrees    Right Shoulder External Rotation 83 Degrees               LYMPHEDEMA/ONCOLOGY QUESTIONNAIRE - 07/15/20 0001       Surgeries   Mastectomy Date 05/18/20    Lumpectomy Date 04/15/20    Sentinel Lymph Node Biopsy Date 04/15/20    Axillary Lymph Node Dissection Date 05/18/20    Number Lymph Nodes Removed 15   3 nodes with lumpectomy, (2/12) mastectomy     Treatment   Active Chemotherapy Treatment Yes    Active Radiation Treatment No   will require radiation after completion of chemo in 3 months (oct?)   Current Hormone Treatment No   will begin again after completion of chemo     What other symptoms do you have   Are you Having Heaviness or Tightness No    Are you having Pain No    Are you having pitting edema No    Is it Hard or Difficult finding clothes that fit No    Do you have infections No      Lymphedema Assessments   Lymphedema Assessments Upper extremities      Right Upper Extremity Lymphedema   10 cm Proximal to Olecranon Process 24.5 cm    Olecranon  Process 23 cm    10 cm Proximal to Ulnar Styloid Process 20.3 cm    Just Proximal to Ulnar Styloid Process 15 cm    Across Hand at [employer] 19.3 cm  At [facility] of 2nd Digit 6 cm      Left Upper Extremity Lymphedema   10 cm Proximal to Olecranon Process 23.9 cm    Olecranon Process 23.2 cm    10 cm Proximal to Ulnar Styloid Process 19.3 cm    Just Proximal to Ulnar Styloid Process 14.8 cm    Across Hand at [employer] 19 cm    At [place] of 2nd Digit 6 cm                        [facility] Adult PT Treatment/Exercise - 07/15/20 0001       Shoulder Exercises: Supine   Flexion AAROM;10 reps   with 5 sec hold with dowel; pt returned therapist demo   ABduction AAROM;Right;10 reps   with 5 sec holds x 10, pt returned therapist demo                        PT [place] - 07/15/20 1211       PT LONG TERM GOAL #1   Title Patient will demonstrate she has regained near  full shoulder ROM and function post operatively compared to baselines.    Baseline End ROM improved but still limited by cording- 05/13/20; 07/15/20- pt limited at end range by tightness though ROM has improved    Time 6    Period Weeks    Status On-going    Target Date 08/26/20      PT LONG TERM GOAL #2   Title Pt will have decreased pain by atleast 50%    Baseline Pt reports no pain at this time as motion improves-05/13/20    Status Achieved      PT LONG TERM GOAL #3   Title Pt will have quick dash no greater than 20% for return to function prior to next surgery    Baseline 70%; pt did not retake this today but reports no longer feeling limited with reaching and ADLs-05/13/20    Status Partially Met      PT LONG TERM GOAL #4   Title pt will be able to dress, bathe and perform home activities with improved ease    Status Achieved      PT LONG TERM GOAL #5   Title Pt will be independent in a home exercise program for continued strengthening and stretching so she can avoid increased  tightness with radiation    Time 6    Period Weeks    Status New    Target Date 08/26/20                   Plan - 07/15/20 0951     Clinical Impression Statement Pt underwent a R mastectomy and ALND (2/12) on 5/3 following her lumpectomy and SLNB on 04/15/20. Pt is healing well and her scar looks good with good skin mobility. Pt reports after her sugery she has numerous cords throughout her RUE that made it difficult to straighten her elbow or raise her arm. She did the post op breast cancer exercises and now does not have any signs of cording. Her R shoulder ROM is slightly limited but is better than it was after her lumpectomy. She is currently undergoing chemotherapy and will begin radiation in about 3 months following completion of chemo. Pt would benefit from skilled PT services to improve R shoulder ROM, assit pt with obtaining a prophylactic compression sleeve for flights and  instruct pt in a home exercise program.    PT Frequency Biweekly    PT Duration 6 weeks    PT Treatment/Interventions ADLs/Self Care Home Management;Therapeutic exercise;Patient/family education;Manual techniques;Manual lymph drainage;Scar mobilization;Passive range of motion;Orthotic Fit/Training    PT Next Visit Plan continue with PROM to R shoulder and instructing in an HEP for R shoulder ROM and strength, assist with obtaining prophylactic compression sleeve, can place on hold once pt regains full ROM and plan to see pt again either halfway through radiation or at completion of radiation    PT Home Exercise Plan Post op shoulder ROM HEP (supine for flex and stargazer), supine dowel flex and abd    Consulted and Agree with Plan of Care Patient             Patient will benefit from skilled therapeutic intervention in order to improve the following deficits and impairments:  Postural dysfunction, Decreased range of motion, Impaired UE functional use, Pain, Decreased knowledge of precautions, Decreased  activity tolerance, Impaired sensation, Decreased strength, Increased fascial restricitons  Visit Diagnosis: Stiffness of right shoulder, not elsewhere classified  Aftercare following surgery for neoplasm  Abnormal posture  Malignant neoplasm of upper-outer quadrant of right breast in female, estrogen receptor positive ([place])     Problem List Patient Active Problem List   Diagnosis Date Noted   Menopausal syndrome (hot flushes) 70/26/3785   Monoallelic mutation of [id number] gene 12/19/2019   Genetic testing 11/26/2019   Family history of breast cancer 11/19/2019   Malignant neoplasm of upper-outer quadrant of right breast in female, estrogen receptor positive ([place]) 11/17/2019   S/P breast augmentation 09/18/2019   Hx of migraines 06/20/2011   GERD (gastroesophageal reflux disease) 06/20/2011   Anemia 06/20/2011    Allyson Sabal [facility] 07/15/2020, 12:13 PM  [place], [place], [address] Phone: [phone]   Fax:  [phone]  Name: Caitlyn Miller MRN: [id number] Date of Birth: 16-May-1985   Manus Gunning, PT 07/15/20 12:13 PM

## 2020-07-15 NOTE — Therapy (Deleted)
[place], [place], [address] Phone: [phone]   Fax:  [phone]  Physical Therapy Evaluation  Patient Details  Name: Caitlyn Miller MRN: [id number] Date of Birth: March 29, 1985 Referring Provider (PT): Dr. Rodman Key Tsuei/Feng   Encounter Date: 07/15/2020   PT End of Session - 07/15/20 0957     Visit Number 4    Number of Visits 7    Date for PT Re-Evaluation 08/26/20    PT Start Time 0917   pt arrived late   PT Stop Time 0947    PT Time Calculation (min) 30 min    Activity Tolerance Patient tolerated treatment well    Behavior During Therapy [facility] for tasks assessed/performed             Past Medical History:  Diagnosis Date   Abnormal Pap smear    Acid reflux    Anemia    Anxiety    Breast cancer ([place])    Decreased appetite 10/08   Depression    Endometriosis 10/2004   Epigastric pain 10/2006   Family history of breast cancer 11/19/2019   FH: migraines    GBS carrier    H/O amenorrhea 06/2006   H/O dyspareunia 7/07   H/O fatigue    H/O nausea and vomiting 10/2006   H/O rubella    H/O varicella    Hyperemesis arising during pregnancy    First pregnancy   Irregular periods/menstrual cycles 05/2776   Monoallelic mutation of EUM35T gene 12/19/2019   Pelvic pain 09/2008    Past Surgical History:  Procedure Laterality Date   AUGMENTATION MAMMAPLASTY Bilateral 6144   silicone    BREAST IMPLANT REMOVAL Right 05/18/2020   Procedure: REMOVAL OF RIGHT BREAST IMPLANT;  Surgeon: Donnie Mesa, MD;  Location: [place];  Service: General;  Laterality: Right;   BREAST LUMPECTOMY WITH RADIOACTIVE SEED LOCALIZATION Right 04/15/2020   Procedure: RIGHT BREAST LUMPECTOMY WITH RADIOACTIVE SEED LOCALIZATION;  Surgeon: Donnie Mesa, MD;  Location: [place];  Service: General;  Laterality: Right;   MODIFIED MASTECTOMY Right 05/18/2020   Procedure: RIGHT MODIFIED RADICAL MASTECTOMY;  Surgeon:  Donnie Mesa, MD;  Location: [place];  Service: General;  Laterality: Right;   PORTACATH PLACEMENT Left 06/10/2020   Procedure: INSERTION PORT-A-CATH;  Surgeon: Donnie Mesa, MD;  Location: [place];  Service: General;  Laterality: Left;  45 MINUTES ROOM 2   RADIOACTIVE SEED GUIDED AXILLARY SENTINEL LYMPH NODE Right 04/15/2020   Procedure: RADIOACTIVE SEED GUIDED RIGHT AXILLARY SENTINEL LYMPH NODE DISSECTION;  Surgeon: Donnie Mesa, MD;  Location: [place];  Service: General;  Laterality: Right;   SENTINEL NODE BIOPSY N/A 04/15/2020   Procedure: SENTINEL NODE BIOPSY;  Surgeon: Donnie Mesa, MD;  Location: [place];  Service: General;  Laterality: N/A;   WISDOM TOOTH EXTRACTION      There were no vitals filed for this visit.    Subjective Assessment - 07/15/20 0916     Subjective The chemo is hard. I just had my second chemo and I have to do it once a week. I am having nausea. I have been doing some therapy at home so my ROM is getting better day by day. I had three cords in my right arm.    Pertinent History Patient was diagnosed on 11/05/2019 with right grade II invasive ductal carcinoma breast cancer. It measures 4.7 cm and is located in the upper outer quadrant. It is ER/PR positive and  HER2 negative with a Ki67 of 5%. She has a biopsied positive axillary lymph node. She had a Right lumpectomy with SLNB (2/3+) performed on 04/15/2020,  right modified mastectomy ALND (2/12), currently doing chemo and will need radiation    Patient Stated Goals Get back to normal use of right UE    Currently in Pain? No/denies    Pain Score 0-No pain                [facility] PT Assessment - 07/15/20 0001       Assessment   Prior Therapy baselines      Precautions   Precautions Other (comment)    Precaution Comments at risk for lymphedema      Restrictions   Weight Bearing Restrictions No      Balance Screen   Has the patient fallen in the  past 6 months No    Has the patient had a decrease in activity level because of a fear of falling?  Yes   since starting chemo bc pt feels dizzy   Is the patient reluctant to leave their home because of a fear of falling?  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    Leisure pt not currently exercising      Cognition   Overall Cognitive Status Within Functional Limits for tasks assessed      AROM   Right Shoulder Extension 53 Degrees    Right Shoulder Flexion 153 Degrees    Right Shoulder ABduction 170 Degrees    Right Shoulder Internal Rotation 69 Degrees    Right Shoulder External Rotation 83 Degrees               LYMPHEDEMA/ONCOLOGY QUESTIONNAIRE - 07/15/20 0001       Surgeries   Mastectomy Date 05/18/20    Lumpectomy Date 04/15/20    Sentinel Lymph Node Biopsy Date 04/15/20    Axillary Lymph Node Dissection Date 05/18/20    Number Lymph Nodes Removed 15   3 nodes with lumpectomy, (2/12) mastectomy     Treatment   Active Chemotherapy Treatment Yes    Active Radiation Treatment No   will require radiation after completion of chemo in 3 months (oct?)   Current Hormone Treatment No   will begin again after completion of chemo     What other symptoms do you have   Are you Having Heaviness or Tightness No    Are you having Pain No    Are you having pitting edema No    Is it Hard or Difficult finding clothes that fit No    Do you have infections No      Lymphedema Assessments   Lymphedema Assessments Upper extremities      Right Upper Extremity Lymphedema   10 cm Proximal to Olecranon Process 24.5 cm    Olecranon  Process 23 cm    10 cm Proximal to Ulnar Styloid Process 20.3 cm    Just Proximal to Ulnar Styloid Process 15 cm    Across Hand at [employer] 19.3 cm  At [facility] of 2nd Digit 6 cm      Left Upper Extremity Lymphedema   10 cm Proximal to Olecranon Process 23.9 cm    Olecranon Process 23.2 cm    10 cm Proximal to Ulnar Styloid Process 19.3 cm    Just Proximal to Ulnar Styloid Process 14.8 cm    Across Hand at [employer] 19 cm    At [place] of 2nd Digit 6 cm                     Objective measurements completed on examination: See above findings.       [place] Adult PT Treatment/Exercise - 07/15/20 0001       Shoulder Exercises: Supine   Flexion AAROM;10 reps   with 5 sec hold with dowel; pt returned therapist demo   ABduction AAROM;Right;10 reps   with 5 sec holds x 10, pt returned therapist demo                        PT [place] - 07/15/20 1211       PT LONG TERM GOAL #1   Title Patient will demonstrate she has regained near  full shoulder ROM and function post operatively compared to baselines.    Baseline End ROM improved but still limited by cording- 05/13/20; 07/15/20- pt limited at end range by tightness though ROM has improved    Time 6    Period Weeks    Status On-going    Target Date 08/26/20      PT LONG TERM GOAL #2   Title Pt will have decreased pain by atleast 50%    Baseline Pt reports no pain at this time as motion improves-05/13/20    Status Achieved      PT LONG TERM GOAL #3   Title Pt will have quick dash no greater than 20% for return to function prior to next surgery    Baseline 70%; pt did not retake this today but reports no longer feeling limited with reaching and ADLs-05/13/20    Status Partially Met      PT LONG TERM GOAL #4   Title pt will be able to dress, bathe and perform home activities with improved ease    Status Achieved      PT LONG TERM GOAL #5   Title Pt will be independent in a home exercise  program for continued strengthening and stretching so she can avoid increased tightness with radiation    Time 6    Period Weeks    Status New    Target Date 08/26/20                    Plan - 07/15/20 0951     Clinical Impression Statement Pt underwent a R mastectomy and ALND (2/12) on 5/3 following her lumpectomy and SLNB on 04/15/20. Pt is healing well and her scar looks good with good skin mobility. Pt reports after her sugery she has numerous cords throughout her RUE that made it difficult to straighten her elbow or raise her arm. She did the post op breast cancer exercises and now does not have any signs of cording. Her R shoulder ROM is slightly limited but is better than it was after her lumpectomy. She is currently undergoing chemotherapy and will begin radiation in about 3 months following completion of chemo. Pt would benefit from skilled PT services to improve R shoulder  ROM, assit pt with obtaining a prophylactic compression sleeve for flights and instruct pt in a home exercise program.    PT Frequency Biweekly    PT Duration 6 weeks    PT Treatment/Interventions ADLs/Self Care Home Management;Therapeutic exercise;Patient/family education;Manual techniques;Manual lymph drainage;Scar mobilization;Passive range of motion;Orthotic Fit/Training    PT Next Visit Plan continue with PROM to R shoulder and instructing in an HEP for R shoulder ROM and strength, assist with obtaining prophylactic compression sleeve, can place on hold once pt regains full ROM and plan to see pt again either halfway through radiation or at completion of radiation    PT Home Exercise Plan Post op shoulder ROM HEP (supine for flex and stargazer), supine dowel flex and abd    Consulted and Agree with Plan of Care Patient             Patient will benefit from skilled therapeutic intervention in order to improve the following deficits and impairments:  Postural dysfunction, Decreased range of motion,  Impaired UE functional use, Pain, Decreased knowledge of precautions, Decreased activity tolerance, Impaired sensation, Decreased strength, Increased fascial restricitons  Visit Diagnosis: Stiffness of right shoulder, not elsewhere classified  Aftercare following surgery for neoplasm  Abnormal posture  Malignant neoplasm of upper-outer quadrant of right breast in female, estrogen receptor positive ([place])     Problem List Patient Active Problem List   Diagnosis Date Noted   Menopausal syndrome (hot flushes) 18/20/9906   Monoallelic mutation of [id number] gene 12/19/2019   Genetic testing 11/26/2019   Family history of breast cancer 11/19/2019   Malignant neoplasm of upper-outer quadrant of right breast in female, estrogen receptor positive ([place]) 11/17/2019   S/P breast augmentation 09/18/2019   Hx of migraines 06/20/2011   GERD (gastroesophageal reflux disease) 06/20/2011   Anemia 06/20/2011    Allyson Sabal [facility] 07/15/2020, 12:13 PM  [place], [place], [address] Phone: [phone]   Fax:  [phone]  Name: Caitlyn Miller MRN: [id number] Date of Birth: 1985-10-27

## 2020-07-15 NOTE — Patient Instructions (Signed)
[facility]  Discharge Instructions: Thank you for choosing [facility] to provide your oncology and hematology care.   If you have a lab appointment with the [facility], please go directly to the [facility] and check in at the registration area.   Wear comfortable clothing and clothing appropriate for easy access to any Portacath or PICC line.   We strive to give you quality time with your provider. You may need to reschedule your appointment if you arrive late (15 or more minutes).  Arriving late affects you and other patients whose appointments are after yours.  Also, if you miss three or more appointments without notifying the office, you may be dismissed from the clinic at the provider's discretion.      For prescription refill requests, have your pharmacy contact our office and allow 72 hours for refills to be completed.    Today you received the following chemotherapy and/or immunotherapy agents Taxol      To help prevent nausea and vomiting after your treatment, we encourage you to take your nausea medication as directed.  BELOW ARE SYMPTOMS THAT SHOULD BE REPORTED IMMEDIATELY: *FEVER GREATER THAN 100.4 F (38 C) OR HIGHER *CHILLS OR SWEATING *NAUSEA AND VOMITING THAT IS NOT CONTROLLED WITH YOUR NAUSEA MEDICATION *UNUSUAL SHORTNESS OF BREATH *UNUSUAL BRUISING OR BLEEDING *URINARY PROBLEMS (pain or burning when urinating, or frequent urination) *BOWEL PROBLEMS (unusual diarrhea, constipation, pain near the anus) TENDERNESS IN MOUTH AND THROAT WITH OR WITHOUT PRESENCE OF ULCERS (sore throat, sores in mouth, or a toothache) UNUSUAL RASH, SWELLING OR PAIN  UNUSUAL VAGINAL DISCHARGE OR ITCHING   Items with * indicate a potential emergency and should be followed up as soon as possible or go to the Emergency Department if any problems should occur.  Please show the CHEMOTHERAPY ALERT CARD or IMMUNOTHERAPY ALERT CARD at check-in to the  Emergency Department and triage nurse.  Should you have questions after your visit or need to cancel or reschedule your appointment, please contact [facility]  Dept: [phone]  and follow the prompts.  Office hours are 8:00 a.m. to 4:30 p.m. Monday - Friday. Please note that voicemails left after 4:00 p.m. may not be returned until the following business day.  We are closed weekends and major holidays. You have access to a nurse at all times for urgent questions. Please call the main number to the clinic Dept: [phone] and follow the prompts.   For any non-urgent questions, you may also contact your provider using MyChart. We now offer e-Visits for anyone 47 and older to request care online for non-urgent symptoms. For details visit [email or web address].   Also download the MyChart app! Go to the app store, search "MyChart", open the app, select [facility], and log in with your MyChart username and password.  Due to Covid, a mask is required upon entering the hospital/clinic. If you do not have a mask, one will be given to you upon arrival. For doctor visits, patients may have 1 support person aged 91 or older with them. For treatment visits, patients cannot have anyone with them due to current Covid guidelines and our immunocompromised population.

## 2020-07-22 ENCOUNTER — Inpatient Hospital Stay: Payer: 59 | Attending: Hematology

## 2020-07-22 ENCOUNTER — Encounter: Payer: Self-pay | Admitting: Nurse Practitioner

## 2020-07-22 ENCOUNTER — Inpatient Hospital Stay (HOSPITAL_BASED_OUTPATIENT_CLINIC_OR_DEPARTMENT_OTHER): Payer: 59 | Admitting: Nurse Practitioner

## 2020-07-22 ENCOUNTER — Inpatient Hospital Stay: Payer: 59

## 2020-07-22 ENCOUNTER — Other Ambulatory Visit: Payer: Self-pay

## 2020-07-22 VITALS — HR 68

## 2020-07-22 VITALS — BP 123/86 | HR 101 | Temp 98.7°F | Resp 18 | Ht 64.0 in | Wt 127.7 lb

## 2020-07-22 DIAGNOSIS — Z5111 Encounter for antineoplastic chemotherapy: Secondary | ICD-10-CM | POA: Diagnosis present

## 2020-07-22 DIAGNOSIS — R7401 Elevation of levels of liver transaminase levels: Secondary | ICD-10-CM | POA: Diagnosis not present

## 2020-07-22 DIAGNOSIS — C773 Secondary and unspecified malignant neoplasm of axilla and upper limb lymph nodes: Secondary | ICD-10-CM | POA: Insufficient documentation

## 2020-07-22 DIAGNOSIS — Z170 Estrogen receptor positive status [ER+]: Secondary | ICD-10-CM

## 2020-07-22 DIAGNOSIS — C50411 Malignant neoplasm of upper-outer quadrant of right female breast: Secondary | ICD-10-CM | POA: Diagnosis present

## 2020-07-22 DIAGNOSIS — Z452 Encounter for adjustment and management of vascular access device: Secondary | ICD-10-CM | POA: Insufficient documentation

## 2020-07-22 DIAGNOSIS — Z95828 Presence of other vascular implants and grafts: Secondary | ICD-10-CM

## 2020-07-22 DIAGNOSIS — K5900 Constipation, unspecified: Secondary | ICD-10-CM | POA: Diagnosis not present

## 2020-07-22 DIAGNOSIS — G629 Polyneuropathy, unspecified: Secondary | ICD-10-CM | POA: Insufficient documentation

## 2020-07-22 LAB — CBC WITH DIFFERENTIAL (CANCER CENTER ONLY)
Abs Immature Granulocytes: 0.03 10*3/uL (ref 0.00–0.07)
Basophils Absolute: 0 10*3/uL (ref 0.0–0.1)
Basophils Relative: 1 %
Eosinophils Absolute: 0.1 10*3/uL (ref 0.0–0.5)
Eosinophils Relative: 1 %
HCT: 32.4 % — ABNORMAL LOW (ref 36.0–46.0)
Hemoglobin: 10.9 g/dL — ABNORMAL LOW (ref 12.0–15.0)
Immature Granulocytes: 1 %
Lymphocytes Relative: 48 %
Lymphs Abs: 1.9 10*3/uL (ref 0.7–4.0)
MCH: 30.6 pg (ref 26.0–34.0)
MCHC: 33.6 g/dL (ref 30.0–36.0)
MCV: 91 fL (ref 80.0–100.0)
Monocytes Absolute: 0.3 10*3/uL (ref 0.1–1.0)
Monocytes Relative: 8 %
Neutro Abs: 1.7 10*3/uL (ref 1.7–7.7)
Neutrophils Relative %: 41 %
Platelet Count: 284 10*3/uL (ref 150–400)
RBC: 3.56 MIL/uL — ABNORMAL LOW (ref 3.87–5.11)
RDW: 12.5 % (ref 11.5–15.5)
WBC Count: 4 10*3/uL (ref 4.0–10.5)
nRBC: 0 % (ref 0.0–0.2)

## 2020-07-22 LAB — CMP (CANCER CENTER ONLY)
ALT: 239 U/L — ABNORMAL HIGH (ref 0–44)
AST: 157 U/L — ABNORMAL HIGH (ref 15–41)
Albumin: 4.1 g/dL (ref 3.5–5.0)
Alkaline Phosphatase: 94 U/L (ref 38–126)
Anion gap: 11 (ref 5–15)
BUN: 9 mg/dL (ref 6–20)
CO2: 26 mmol/L (ref 22–32)
Calcium: 9.8 mg/dL (ref 8.9–10.3)
Chloride: 105 mmol/L (ref 98–111)
Creatinine: 0.67 mg/dL (ref 0.44–1.00)
GFR, Estimated: >60 mL/min (ref >60)
Glucose, Bld: 85 mg/dL (ref 70–99)
Potassium: 3.9 mmol/L (ref 3.5–5.1)
Sodium: 142 mmol/L (ref 135–145)
Total Bilirubin: 0.4 mg/dL (ref 0.3–1.2)
Total Protein: 7.5 g/dL (ref 6.5–8.1)

## 2020-07-22 MED ORDER — GOSERELIN ACETATE 3.6 MG ~~LOC~~ IMPL
DRUG_IMPLANT | SUBCUTANEOUS | Status: AC
  Filled 2020-07-22: qty 3.6

## 2020-07-22 MED ORDER — DIPHENHYDRAMINE HCL 50 MG/ML IJ SOLN
INTRAMUSCULAR | Status: AC
  Filled 2020-07-22: qty 1

## 2020-07-22 MED ORDER — FAMOTIDINE 20 MG IN NS 100 ML IVPB
INTRAVENOUS | Status: AC
  Filled 2020-07-22: qty 100

## 2020-07-22 MED ORDER — SODIUM CHLORIDE 0.9 % IV SOLN
80.0000 mg/m2 | Freq: Once | INTRAVENOUS | Status: AC
  Administered 2020-07-22: 132 mg via INTRAVENOUS
  Filled 2020-07-22: qty 22

## 2020-07-22 MED ORDER — SODIUM CHLORIDE 0.9 % IV SOLN
10.0000 mg | Freq: Once | INTRAVENOUS | Status: AC
  Administered 2020-07-22: 10 mg via INTRAVENOUS
  Filled 2020-07-22: qty 10

## 2020-07-22 MED ORDER — SODIUM CHLORIDE 0.9 % IV SOLN
Freq: Once | INTRAVENOUS | Status: AC
  Filled 2020-07-22: qty 250

## 2020-07-22 MED ORDER — DIPHENHYDRAMINE HCL 50 MG/ML IJ SOLN
25.0000 mg | Freq: Once | INTRAMUSCULAR | Status: AC
  Administered 2020-07-22: 25 mg via INTRAVENOUS

## 2020-07-22 MED ORDER — SODIUM CHLORIDE 0.9% FLUSH
10.0000 mL | INTRAVENOUS | Status: AC | PRN
Start: 2020-07-22 — End: 2020-07-22
  Administered 2020-07-22: 10 mL
  Filled 2020-07-22: qty 10

## 2020-07-22 MED ORDER — FAMOTIDINE 20 MG IN NS 100 ML IVPB
20.0000 mg | Freq: Once | INTRAVENOUS | Status: AC
Start: 2020-07-22 — End: 2020-07-22
  Administered 2020-07-22: 20 mg via INTRAVENOUS

## 2020-07-22 MED ORDER — COLD PACK MISC ONCOLOGY
1.0000 | Freq: Once | Status: AC | PRN
  Administered 2020-07-22: 1 via TOPICAL
  Filled 2020-07-22: qty 1

## 2020-07-22 MED ORDER — HEPARIN SOD (PORK) LOCK FLUSH 100 UNIT/ML IV SOLN
500.0000 [IU] | Freq: Once | INTRAVENOUS | Status: AC | PRN
  Administered 2020-07-22: 500 [IU]
  Filled 2020-07-22: qty 5

## 2020-07-22 MED ORDER — SODIUM CHLORIDE 0.9% FLUSH
10.0000 mL | INTRAVENOUS | Status: DC | PRN
  Administered 2020-07-22: 10 mL
  Filled 2020-07-22: qty 10

## 2020-07-22 MED ORDER — GOSERELIN ACETATE 3.6 MG ~~LOC~~ IMPL
3.6000 mg | DRUG_IMPLANT | Freq: Once | SUBCUTANEOUS | Status: AC
  Administered 2020-07-22: 3.6 mg via SUBCUTANEOUS

## 2020-07-22 NOTE — Progress Notes (Signed)
Per Cira Rue NP and pharmacy, ok for treatment today with elevated AST, ALT.

## 2020-07-22 NOTE — Patient Instructions (Signed)
[facility]  Discharge Instructions: Thank you for choosing [facility] to provide your oncology and hematology care.   If you have a lab appointment with the [facility], please go directly to the [facility] and check in at the registration area.   Wear comfortable clothing and clothing appropriate for easy access to any Portacath or PICC line.   We strive to give you quality time with your provider. You may need to reschedule your appointment if you arrive late (15 or more minutes).  Arriving late affects you and other patients whose appointments are after yours.  Also, if you miss three or more appointments without notifying the office, you may be dismissed from the clinic at the provider's discretion.      For prescription refill requests, have your pharmacy contact our office and allow 72 hours for refills to be completed.    Today you received the following chemotherapy and/or immunotherapy agent: Paclitaxel (Taxol).   To help prevent nausea and vomiting after your treatment, we encourage you to take your nausea medication as directed.  BELOW ARE SYMPTOMS THAT SHOULD BE REPORTED IMMEDIATELY: *FEVER GREATER THAN 100.4 F (38 C) OR HIGHER *CHILLS OR SWEATING *NAUSEA AND VOMITING THAT IS NOT CONTROLLED WITH YOUR NAUSEA MEDICATION *UNUSUAL SHORTNESS OF BREATH *UNUSUAL BRUISING OR BLEEDING *URINARY PROBLEMS (pain or burning when urinating, or frequent urination) *BOWEL PROBLEMS (unusual diarrhea, constipation, pain near the anus) TENDERNESS IN MOUTH AND THROAT WITH OR WITHOUT PRESENCE OF ULCERS (sore throat, sores in mouth, or a toothache) UNUSUAL RASH, SWELLING OR PAIN  UNUSUAL VAGINAL DISCHARGE OR ITCHING   Items with * indicate a potential emergency and should be followed up as soon as possible or go to the Emergency Department if any problems should occur.  Please show the CHEMOTHERAPY ALERT CARD or IMMUNOTHERAPY ALERT CARD at  check-in to the Emergency Department and triage nurse.  Should you have questions after your visit or need to cancel or reschedule your appointment, please contact [facility]  Dept: [phone]  and follow the prompts.  Office hours are 8:00 a.m. to 4:30 p.m. Monday - Friday. Please note that voicemails left after 4:00 p.m. may not be returned until the following business day.  We are closed weekends and major holidays. You have access to a nurse at all times for urgent questions. Please call the main number to the clinic Dept: [phone] and follow the prompts.   For any non-urgent questions, you may also contact your provider using MyChart. We now offer e-Visits for anyone 62 and older to request care online for non-urgent symptoms. For details visit [email or web address].   Also download the MyChart app! Go to the app store, search "MyChart", open the app, select [facility], and log in with your MyChart username and password.  Due to Covid, a mask is required upon entering the hospital/clinic. If you do not have a mask, one will be given to you upon arrival. For doctor visits, patients may have 1 support person aged 27 or older with them. For treatment visits, patients cannot have anyone with them due to current Covid guidelines and our immunocompromised population.   Goserelin injection What is this medication? GOSERELIN (GOE se rel in) is similar to a hormone found in the body. It lowers the amount of sex hormones that the body makes. Men will have lower testosterone levels and women will have lower estrogen levels while taking this medicine. In men, this  medicine is used to treat prostate cancer; the injection is either given once per month or once every 12 weeks. A once per month injection (only) is used to treat women with endometriosis, dysfunctionaluterine bleeding, or advanced breast cancer. This medicine may be used for other purposes; ask your health  care provider orpharmacist if you have questions. COMMON BRAND NAME(S): Zoladex What should I tell my care team before I take this medication? They need to know if you have any of these conditions: bone problems diabetes heart disease history of irregular heartbeat an unusual or allergic reaction to goserelin, other medicines, foods, dyes, or preservatives pregnant or trying to get pregnant breast-feeding How should I use this medication? This medicine is for injection under the skin. It is given by a health careprofessional in a hospital or clinic setting. Talk to your pediatrician regarding the use of this medicine in children.Special care may be needed. Overdosage: If you think you have taken too much of this medicine contact apoison control center or emergency room at once. NOTE: This medicine is only for you. Do not share this medicine with others. What if I miss a dose? It is important not to miss your dose. Call your doctor or health careprofessional if you are unable to keep an appointment. What may interact with this medication? Do not take this medicine with any of the following medications: cisapride dronedarone pimozide thioridazine This medicine may also interact with the following medications: other medicines that prolong the QT interval (an abnormal heart rhythm) This list may not describe all possible interactions. Give your health care provider a list of all the medicines, herbs, non-prescription drugs, or dietary supplements you use. Also tell them if you smoke, drink alcohol, or use illegaldrugs. Some items may interact with your medicine. What should I watch for while using this medication? Visit your doctor or health care provider for regular checks on your progress. Your symptoms may appear to get worse during the first weeks of this therapy. Tell your doctor or healthcare provider if your symptoms do not start to getbetter or if they get worse after this time. Your  bones may get weaker if you take this medicine for a long time. If you smoke or frequently drink alcohol you may increase your risk of bone loss. A family history of osteoporosis, chronic use of drugs for seizures (convulsions), or corticosteroids can also increase your risk of bone loss.Talk to your doctor about how to keep your bones strong. This medicine should stop regular monthly menstruation in women. Tell yourdoctor if you continue to menstruate. Women should not become pregnant while taking this medicine or for 12 weeks after stopping this medicine. Women should inform their doctor if they wish to become pregnant or think they might be pregnant. There is a potential for serious side effects to an unborn child. Talk to your health care professional or pharmacist for more information. Do not breast-feed an infant while takingthis medicine. Men should inform their doctors if they wish to father a child. This medicine may lower sperm counts. Talk to your health care professional or pharmacist formore information. This medicine may increase blood sugar. Ask your healthcare provider if changesin diet or medicines are needed if you have diabetes. What side effects may I notice from receiving this medication? Side effects that you should report to your doctor or health care professionalas soon as possible: allergic reactions like skin rash, itching or hives, swelling of the face, lips, or tongue bone pain breathing  problems changes in vision chest pain feeling faint or lightheaded, falls fever, chills pain, swelling, warmth in the leg pain, tingling, numbness in the hands or feet signs and symptoms of high blood sugar such as being more thirsty or hungry or having to urinate more than normal. You may also feel very tired or have blurry vision signs and symptoms of low blood pressure like dizziness; feeling faint or lightheaded, falls; unusually weak or tired stomach pain swelling of the ankles,  feet, hands trouble passing urine or change in the amount of urine unusually high or low blood pressure unusually weak or tired Side effects that usually do not require medical attention (report to yourdoctor or health care professional if they continue or are bothersome): change in sex drive or performance changes in breast size in both males and females changes in emotions or moods headache hot flashes irritation at site where injected loss of appetite skin problems like acne, dry skin vaginal dryness This list may not describe all possible side effects. Call your doctor for medical advice about side effects. You may report side effects to FDA at1-800-FDA-1088. Where should I keep my medication? This drug is given in a hospital or clinic and will not be stored at home. NOTE: This sheet is a summary. It may not cover all possible information. If you have questions about this medicine, talk to your doctor, pharmacist, orhealth care provider.  2022 [employer] Standard (2018-04-22 14:05:56)

## 2020-07-22 NOTE — Progress Notes (Signed)
[place]   [phone] [phone]   Clinic Follow up Note   Patient Care Team: [name], Lilia Argue, MD as PCP - General (Family Medicine) Mansouraty, Telford Nab., MD as Consulting Physician (Gastroenterology) Mauro Kaufmann, RN as Oncology Nurse Navigator Rockwell Germany, RN as Oncology Nurse Navigator Donnie Mesa, MD as Consulting Physician (General Surgery) Truitt Merle, MD as Consulting Physician (Hematology) Kyung Rudd, MD as Consulting Physician (Radiation Oncology) Contogiannis, Audrea Muscat, MD as Consulting Physician (Plastic Surgery) 07/22/2020  CHIEF COMPLAINT: Follow up right breast cancer   SUMMARY OF ONCOLOGIC HISTORY: Oncology History Overview Note  Cancer Staging Malignant neoplasm of upper-outer quadrant of right breast in female, estrogen receptor positive ([place]) Staging form: Breast, AJCC 8th Edition - Clinical stage from 11/12/2019: Stage IIA (cT2, cN1, cM0, G2, ER+, PR+, HER2-) - Signed by Truitt Merle, MD on 11/19/2019 Stage prefix: Initial diagnosis Histologic grading system: 3 grade system - Pathologic stage from 04/15/2020: No Stage Recommended (ypT2, pN2a, cM0, G2, ER+, PR+, HER2-) - Signed by Truitt Merle, MD on 05/27/2020 Stage prefix: Post-therapy Histologic grading system: 3 grade system Residual tumor (R): R1 - Microscopic    Malignant neoplasm of upper-outer quadrant of right breast in female, estrogen receptor positive ([place])  11/05/2019 Mammogram   IMPRESSION: Large irregular palpable mass 4.7 x 1.4 x 1.4 cm within the upper-outer right breast 11 o'clock position, 4 cm from nipple.  There is a large area of associated coarse heterogeneous calcifications within the mass. Overall findings are concerning for breast carcinoma.   Two cortically thickened right axillary lymph nodes which are indeterminate in etiology.   11/12/2019 Cancer Staging   Staging form: Breast, AJCC 8th Edition - Clinical stage from  11/12/2019: Stage IIA (cT2, cN1, cM0, G2, ER+, PR+, HER2-) - Signed by Truitt Merle, MD on 11/19/2019    11/12/2019 Initial Biopsy   Diagnosis 1. Breast, right, needle core biopsy, right - INVASIVE MAMMARY CARCINOMA, SEE COMMENT. - MAMMARY CARCINOMA IN SITU. 2. Lymph node, needle/core biopsy, right - METASTATIC MAMMARY CARCINOMA. Microscopic Comment 1. The carcinoma appears grade 2 and measures 16 mm in greatest linear extent. E-cadherin will be ordered. Prognostic makers will be ordered. Dr. Saralyn Pilar has reviewed the case. The case was called to [facility] on 01/13/2020.    1. E-cadherin is strongly positive consistent with a ductal phenotype.   11/12/2019 Receptors her2   1. PROGNOSTIC INDICATORS Results: IMMUNOHISTOCHEMICAL AND MORPHOMETRIC ANALYSIS PERFORMED MANUALLY The tumor cells are NEGATIVE for Her2 (0). Estrogen Receptor: 100%, POSITIVE, STRONG STAINING INTENSITY Progesterone Receptor: 100%, POSITIVE, STRONG STAINING INTENSITY Proliferation Marker Ki67: 5%   11/17/2019 Initial Diagnosis   Malignant neoplasm of upper-outer quadrant of right breast in female, estrogen receptor positive ([place])    11/25/2019 Breast MRI   IMPRESSION: 1. Large area of non-mass enhancement involving the UPPER OUTER QUADRANT of the RIGHT breast measuring approximately 4.2 x 4.1 x 2.4 cm. The biopsy-proven IDC and DCIS is present at the superomedial aspect of this NME. 2. No MRI evidence of malignancy involving the LEFT breast. 3. Intact BILATERAL retropectoral implants. 4. 2 pathologically enlarged RIGHT axillary lymph nodes. One of these nodes is biopsy-proven metastatic disease. No pathologic lymphadenopathy elsewhere   11/25/2019 PET scan   IMPRESSION: 1. Two mildly enlarged hypermetabolic right axillary lymph nodes compatible with right axillary nodal metastases. 2. No additional sites of hypermetabolic metastatic disease. 3. Asymmetric indistinct upper right breast  hypermetabolism, without discrete mass correlate on the CT  images, compatible with known primary right breast malignancy.     11/26/2019 Genetic Testing   Negative genetic testing: no pathogenic variants detected in [facility] STAT Breast Cancer Panel.  Variant of uncertain significance (VUS) detected in CHEK2 at c.1556G>T (p.Arg519Leu).  The report date is November 26, 2019.   The STAT Breast cancer panel offered by [facility] includes sequencing and rearrangement analysis for the following 9 genes:  ATM, BRCA1, BRCA2, CDH1, CHEK2, PALB2, PTEN, STK11 and TP53.    Results of [facility] Multi-Cancer Panel are pending.    12/01/2019 -  Anti-estrogen oral therapy   Neoadjuvant Tamoxifen 12m on 12/01/19. Reduced to 130mon 12/09/19.       ---Zoladex injection monthly starting 12/09/19.       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  Variants of uncertain significance detected RAD51D at c.715C>T (p.Arg239Trp) and CHEK2 at c.1556G>T (p.Arg519Leu).  The report date is December 08, 2019.   The Multi-Cancer Panel offered by [facility] includes sequencing and/or deletion duplication testing of the following 85 genes: AIP, ALK, APC, ATM, AXIN2,BAP1,  BARD1, BLM, BMPR1A, BRCA1, BRCA2, BRIP1, CASR, CDC73, CDH1, CDK4, CDKN1B, CDKN1C, CDKN2A (p14ARF), CDKN2A (p16INK4a), CEBPA, CHEK2, CTNNA1, DICER1, DIS3L2, EGFR (c.2369C>T, p.Thr790Met variant only), EPCAM (Deletion/duplication testing only), FH, FLCN, GATA2, GPC3, GREM1 (Promoter region deletion/duplication testing only), HOXB13 (c.251G>A, p.Gly84Glu), HRAS, KIT, MAX, MEN1, MET, MITF (c.952G>A, p.Glu318Lys variant only), MLH1, MSH2, MSH3, MSH6, MUTYH, NBN, NF1, NF2, NTHL1, PALB2, PDGFRA, PHOX2B, PMS2, POLD1, POLE, POT1, PRKAR1A, PTCH1, PTEN, RAD50, RAD51C, RAD51D, RB1, RECQL4, RET, RNF43, RUNX1, SDHAF2, SDHA  (sequence changes only), SDHB, SDHC, SDHD, SMAD4, SMARCA4, SMARCB1, SMARCE1, STK11, SUFU, TERC, TERT, TMEM127, TP53, TSC1, TSC2, VHL, WRN and WT1.    12/09/2019 Miscellaneous   Mammaprint luminal type A, low risk  10% risk of recurrence in 10 years with 97.8% benefor of hormaonal therapy alone.    01/12/2020 - 03/25/2020 Chemotherapy   Verzenio started on 01/12/2020, dose reduced to 5073mm and 100m37m11/2022 due to poor tolerance. Held Verzenio on 03/25/20 to proceed with breast surgery.    03/01/2020 Mammogram   Targeted ultrasound is performed, showing interval decreasing conspicuity of the patient's known right breast cancer. A post biopsy clip with an associated irregular area shadowing is demonstrated at the 11 o'clock position 4 cm from the nipple. Exact measurements are difficult due to the vague appearance of the findings. It measures approximately 1.2 x 0.9 x 0.6 cm (previously 4.7 x 2.2 x 1.5 cm).   IMPRESSION: Imaging findings consistent with good response to chemotherapy.   03/16/2020 Imaging   MRI Breast  IMPRESSION: 1. Interval resolution of RIGHT axillary adenopathy. 2. Slightly smaller area of non mass enhancement in the UPPER-OUTER QUADRANT of the RIGHT breast.   04/15/2020 Surgery   RIGHT BREAST LUMPECTOMY WITH RADIOACTIVE SEED LOCALIZATION and RADIOACTIVE SEED GUIDED RIGHT AXILLARY SENTINEL LYMPH NODE DISSECTION and SENTINEL NODE BIOPSY by Dr TsueGeorgette Dover3/31/2022 Pathology Results   FINAL MICROSCOPIC DIAGNOSIS:   A. 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  - Invasive carcinoma broadly involves each the anterior, posterior,  superior and inferior margins.  - DCIS involves the inferior margin and is < 1 mm from each the anterior  and  superior margins.  - Atypical lobular hyperplasia.   - Biopsy sites.  - See oncology table.   B. TARGETED LYMPH NODE, RIGHT AXILLA, BIOPSY:  - Metastatic carcinoma in (1) of (1) lymph node.  - Biopsy site.   C. SENTINEL LYMPH NODE, RIGHT AXILLA #1, BIOPSY:  - Metastatic carcinoma in (1) of (1) lymph node.   D. SENTINEL LYMPH NODE, RIGHT AXILLA #2, BIOPSY:  - One lymph node, negative for carcinoma (0/1).    04/15/2020 Cancer Staging   Staging form: Breast, AJCC 8th Edition - Pathologic stage from 04/15/2020: No Stage Recommended (ypT2, pN2a, cM0, G2, ER+, PR+, HER2-) - Signed by Truitt Merle, MD on 05/27/2020  Stage prefix: Post-therapy  Histologic grading system: 3 grade system  Residual tumor (R): R1 - Microscopic    05/18/2020 Surgery   Right Mastectomy  A. BREAST, RIGHT, MASTECTOMY:  - Residual invasive carcinoma with mixed ductal and lobular features.       Residual invasive carcinoma broadly involves the anterior inferior  soft tissue margin.       Residual invasive carcinoma is focally < 1 mm from the deep margin.  - Residual ductal carcinoma in situ.       Residual DCIS is 3 mm from the closest margin, deep.  - Residual atypical lobular hyperplasia.  - Prior procedure site changes.  - Biopsy clip (X2).   B. BREAST IMPLANT, RIGHT, EXPLANTATION:  - Implant (gross only).   C. LYMPH NODES, RIGHT AXILLA, DISSECTION:  - Metastatic carcinoma in (2) of (12) lymph nodes.  - Prior procedure site changes in surrounding soft tissue.    06/24/2020 -  Chemotherapy    Patient is on Treatment Plan: BREAST ADJUVANT DOSE DENSE AC Q14D / PACLITAXEL Q7D         CURRENT THERAPY: weekly taxol x12 cycles, starting 07/08/20  INTERVAL HISTORY: Ms. Teater returns for follow up as scheduled. She was last seen 6/30 and continues weekly taxol s/p 2 cycles.  She tolerates this much better than AC.  Still has body aches in her back and chest, and mild nausea/vomiting once per day, but better than AC.  Manages constipation with MiraLAX,  antiemetics are helpful.  She is able to eat and drink.  Protonix helps GERD.  Energy still low but better.  She has mild tingling in her fingertips in the morning since starting chemo, stable on Taxol, no functional difficulties or dropping things.  She takes gabapentin 100 mg most days per week.  She has occasional chills at night and moderate hot flashes.  Denies fever, cough, chest pain, dyspnea, rash, new pain, or other concerns.   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   Pelvic pain 09/2008    SURGICAL HISTORY: Past Surgical History:  Procedure Laterality Date   AUGMENTATION MAMMAPLASTY  Bilateral 4580   silicone    BREAST IMPLANT REMOVAL Right 05/18/2020   Procedure: REMOVAL OF RIGHT BREAST IMPLANT;  Surgeon: Donnie Mesa, MD;  Location: [facility];  Service: General;  Laterality: Right;   BREAST LUMPECTOMY WITH RADIOACTIVE SEED LOCALIZATION Right 04/15/2020   Procedure: RIGHT BREAST LUMPECTOMY WITH RADIOACTIVE SEED LOCALIZATION;  Surgeon: Donnie Mesa, MD;  Location: [facility];  Service: General;  Laterality: Right;   MODIFIED MASTECTOMY Right 05/18/2020   Procedure: RIGHT MODIFIED RADICAL MASTECTOMY;  Surgeon: Donnie Mesa, MD;  Location: [facility];  Service: General;  Laterality: Right;   PORTACATH PLACEMENT Left 06/10/2020   Procedure: INSERTION PORT-A-CATH;  Surgeon: Donnie Mesa, MD;  Location: [facility];   Service: General;  Laterality: Left;  45 MINUTES ROOM 2   RADIOACTIVE SEED [place] Right 04/15/2020   Procedure: RADIOACTIVE SEED GUIDED RIGHT AXILLARY SENTINEL LYMPH NODE DISSECTION;  Surgeon: Donnie Mesa, MD;  Location: [facility];  Service: General;  Laterality: Right;   SENTINEL NODE BIOPSY N/A 04/15/2020   Procedure: SENTINEL NODE BIOPSY;  Surgeon: Donnie Mesa, MD;  Location: [facility];  Service: General;  Laterality: N/A;   WISDOM TOOTH EXTRACTION      I have reviewed the social history and family history with the patient and they are unchanged from previous note.  ALLERGIES:  is allergic to ibuprofen.  MEDICATIONS:  Current Outpatient Medications  Medication Sig Dispense Refill   acetaminophen (TYLENOL) 500 MG tablet Take 500 mg by mouth every 6 (six) hours as needed for moderate pain.     clindamycin (CLINDAGEL) 1 % gel Apply topically 2 (two) times daily. 30 g 0   gabapentin (NEURONTIN) 100 MG capsule TAKE 2 CAPSULES(200 MG) BY MOUTH AT BEDTIME 60 capsule 1   lidocaine (LIDODERM) 5 % Place 1 patch onto the skin daily. Remove & Discard patch within 12 hours or as directed by MD 30 patch 0   lidocaine-prilocaine (EMLA) cream Apply to affected area once (Patient taking differently: Apply 1 application topically daily as needed (port access).) 30 g 3   ondansetron (ZOFRAN) 8 MG tablet Take 1 tablet (8 mg total) by mouth every 8 (eight) hours as needed. Start on the third day after chemotherapy. 30 tablet 2   pantoprazole (PROTONIX) 40 MG tablet Take 1 tablet (40 mg total) by mouth daily. 30 tablet 3   prochlorperazine (COMPAZINE) 10 MG tablet Take 1 tablet (10 mg total) by mouth every 6 (six) hours as needed for nausea or vomiting. 30 tablet 3   venlafaxine (EFFEXOR) 37.5 MG tablet Take 1 tablet (37.5 mg total) by mouth daily. 30 tablet 30   magnesium oxide (MAG-OX) 400 (240 Mg) MG tablet Take 1 tablet (400 mg total) by mouth  daily. (Patient not taking: Reported on 07/22/2020) 20 tablet 0   traMADol (ULTRAM) 50 MG tablet Take 1 tablet (50 mg total) by mouth every 6 (six) hours as needed (mild pain). (Patient not taking: Reported on 07/22/2020) 30 tablet 0   No current facility-administered medications for this visit.   Facility-Administered Medications Ordered in Other Visits  Medication Dose Route Frequency Provider Last Rate Last Admin   goserelin (ZOLADEX) injection 3.6 mg  3.6 mg Subcutaneous Once Alla Feeling, NP        PHYSICAL EXAMINATION: ECOG PERFORMANCE STATUS: 1 - Symptomatic but completely ambulatory  Vitals:   07/22/20 0915  BP: 123/86  Pulse: (!) 101  Resp: 18  Temp: 98.7  F (37.1 C)  SpO2: 97%   Filed Weights   07/22/20 0915  Weight: 127 lb 11.2 oz (57.9 kg)    GENERAL:alert, no distress and comfortable SKIN: No rash EYES: sclera clear OROPHARYNX: No thrush or ulcers LUNGS: clear with normal breathing effort HEART: regular rate & rhythm, no lower extremity edema NEURO: alert & oriented x 3 with fluent speech, no focal motor/sensory deficits PAC without erythema Breast exam deferred  LABORATORY DATA:  I have reviewed the data as listed CBC Latest Ref Rng & Units 07/22/2020 07/15/2020 07/08/2020  WBC 4.0 - 10.5 K/uL 4.0 6.3 12.6(H)  Hemoglobin 12.0 - 15.0 g/dL 10.9(L) 10.4(L) 11.0(L)  Hematocrit 36.0 - 46.0 % 32.4(L) 30.3(L) 33.0(L)  Platelets 150 - 400 K/uL 284 322 222     CMP Latest Ref Rng & Units 07/15/2020 07/08/2020 07/02/2020  Glucose 70 - 99 mg/dL 95 90 93  BUN 6 - 20 mg/dL _0 Creatinine 0.44 - 1.00 mg/dL 0.70 0.69 0.40(L)  Sodium 135 - 145 mmol/L 140 141 138  Potassium 3.5 - 5.1 mmol/L 4.1 3.9 4.1  Chloride 98 - 111 mmol/L 105 104 103  CO2 22 - 32 mmol/L 25 26 -  Calcium 8.9 - 10.3 mg/dL 9.8 9.8 -  Total Protein 6.5 - 8.1 g/dL 7.5 7.5 -  Total Bilirubin 0.3 - 1.2 mg/dL 0.3 0.3 -  Alkaline Phos 38 - 126 U/L 90 153(H) -  AST 15 - 41 U/L 56(H) 77(H) -  ALT 0 - 44  U/L 83(H) 144(H) -      RADIOGRAPHIC STUDIES: I have personally reviewed the radiological images as listed and agreed with the findings in the report. No results found.   ASSESSMENT & PLAN: Yoselin Amerman is a 35 y.o. female with   1. Malignant neoplasm of upper-outer quadrant of right breast, Stage IIA, c(T2N1M0), ER+/PR+/HER2-, Grade II, Mammaprint luminal type A, low risk -She has a 4.7cm right breast mass at 11:00 position and two abnormal right axillary LN. 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 -Her 11/2019 breast MRI and PET scan negative for contralateral malignancy or distant metastasis  -due to her large tumor, she needs mastectomy if she proceeded with surgery first.  She has been seen by Dr. Georgette Dover. Her mastectomy will be complicated by history of implant placement. -MammaPrint showed luminal type A, low risk disease, consistent with strongly ER and PR positivity, negative HER-2 and low Ki-67.  She is unlikely to benefit from chemotherapy, and neoadjuvant chemotherapy is not recommended.  -She started neoadjuvant tamoxifen on 12/01/19, tolerated poorly with nausea, and diffuse body aches. Dose reduced to 48m from 12/09/19.  -Started monthly Zoladex injection on 12/09/19, moderate hot flashes stable on gabapentin and effexor.  She switched to AI in 12/2019 -03/01/2020 ultrasound/mammogram showed good response to treatment, she underwent right lumpectomy and sentinel LN dissection on 04/15/2020, path showed larger than expected tumor 3.1 cm with mixed ductal and lobular features and DCIS, she had positive margin and 2/3 positive lymph nodes -She subsequently underwent mastectomy and axillary node dissection on 05/18/2020 by Dr. TGeorgette Dover path showed residual invasive carcinoma and DCIS with positive margin and another 2/12 positive nodes.  Overall stage III disease -Her treatment plan includes  adjuvant chemo, postmastectomy radiation and anti-estrogen  -Tolerated cycle 1 AC poorly, she declined more chemo with AC and moved to weekly Taxol starting 07/08/2020   2. Generalized Weakness, body  pain, Fatigue -Pt notes for the past 3 years she has had mid chest and mid back pain. Previously intermittent, now constant. She also notes b/l arm and leg weakness and pain along with tingling of her toes and fingers. -This has lead to fatigue, low appetite, decreased daily function/activity. -Pt notes prior workup for this in [place] was negative with Neurologist and cardiologist. They said this is related to stress, anxiety or depression. Pt feels she has an illness causing this instead. -Her 11/25/19 PET was negative for any distant malignancy or metastasis.  -Worse with AC, improved on Taxol    3. Anxiety, depression -More anxious than depressed -Weaned off Lexapro and has started Effexor which is helpful.  Can increase if needed for mood and worsening hot flashes   4. 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   -She has discussed with our genetic counselor   5. Social Support -She is from [place] and lives both there and in [place] with her husband. She has 2 teenage children. The rest of her family is in [place].  -Her mother is here now, but will go home and then return later    Disposition: Ms. Chimenti appears stable.  S/p 1 cycle of AC and 2 cycles of weekly Taxol.  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  Side effects are well managed with supportive care at home.  She is able to recover and function well.  She tolerates monthly Zoladex with moderate hot flashes, on Effexor.  She will receive another dose today.  CBC reviewed, CMP is pending.  If stable, proceed with cycle 3 weekly Taxol and Zoladex today  as planned.  Follow-up and cycle 4 next week.  All questions were answered. The patient knows to call the clinic with any problems, questions or concerns. No barriers to learning were detected.     Alla Feeling, NP 07/22/20

## 2020-07-23 ENCOUNTER — Telehealth: Payer: Self-pay | Admitting: Hematology

## 2020-07-23 NOTE — Telephone Encounter (Signed)
Left message with follow-up appointments per 7/7 los. 

## 2020-07-26 ENCOUNTER — Other Ambulatory Visit: Payer: Self-pay | Admitting: Hematology

## 2020-07-27 ENCOUNTER — Telehealth: Payer: Self-pay

## 2020-07-27 NOTE — Progress Notes (Signed)
[place]   [phone] Fax:([phone]   Clinic Follow up Note   Patient Care Team: [name], Lilia Argue, MD as PCP - General (Family Medicine) Mansouraty, Telford Nab., MD as Consulting Physician (Gastroenterology) Mauro Kaufmann, RN as Oncology Nurse Navigator Rockwell Germany, RN as Oncology Nurse Navigator Donnie Mesa, MD as Consulting Physician (General Surgery) Truitt Merle, MD as Consulting Physician (Hematology) Kyung Rudd, MD as Consulting Physician (Radiation Oncology) Contogiannis, Audrea Muscat, MD as Consulting Physician (Plastic Surgery) 07/28/2020  CHIEF COMPLAINT: Follow-up right breast cancer  SUMMARY OF ONCOLOGIC HISTORY: Oncology History Overview Note  Cancer Staging Malignant neoplasm of upper-outer quadrant of right breast in female, estrogen receptor positive ([place]) Staging form: Breast, AJCC 8th Edition - Clinical stage from 11/12/2019: Stage IIA (cT2, cN1, cM0, G2, ER+, PR+, HER2-) - Signed by Truitt Merle, MD on 11/19/2019 Stage prefix: Initial diagnosis Histologic grading system: 3 grade system - Pathologic stage from 04/15/2020: No Stage Recommended (ypT2, pN2a, cM0, G2, ER+, PR+, HER2-) - Signed by Truitt Merle, MD on 05/27/2020 Stage prefix: Post-therapy Histologic grading system: 3 grade system Residual tumor (R): R1 - Microscopic    Malignant neoplasm of upper-outer quadrant of right breast in female, estrogen receptor positive ([place])  11/05/2019 Mammogram   IMPRESSION: Large irregular palpable mass 4.7 x 1.4 x 1.4 cm within the upper-outer right breast 11 o'clock position, 4 cm from nipple.  There is a large area of associated coarse heterogeneous calcifications within the mass. Overall findings are concerning for breast carcinoma.   Two cortically thickened right axillary lymph nodes which are indeterminate in etiology.   11/12/2019 Cancer Staging   Staging form: Breast, AJCC 8th Edition - Clinical stage from  11/12/2019: Stage IIA (cT2, cN1, cM0, G2, ER+, PR+, HER2-) - Signed by Truitt Merle, MD on 11/19/2019    11/12/2019 Initial Biopsy   Diagnosis 1. Breast, right, needle core biopsy, right - INVASIVE MAMMARY CARCINOMA, SEE COMMENT. - MAMMARY CARCINOMA IN SITU. 2. Lymph node, needle/core biopsy, right - METASTATIC MAMMARY CARCINOMA. Microscopic Comment 1. The carcinoma appears grade 2 and measures 16 mm in greatest linear extent. E-cadherin will be ordered. Prognostic makers will be ordered. Dr. Saralyn Pilar has reviewed the case. The case was called to [facility] on 01/13/2020.    1. E-cadherin is strongly positive consistent with a ductal phenotype.   11/12/2019 Receptors her2   1. PROGNOSTIC INDICATORS Results: IMMUNOHISTOCHEMICAL AND MORPHOMETRIC ANALYSIS PERFORMED MANUALLY The tumor cells are NEGATIVE for Her2 (0). Estrogen Receptor: 100%, POSITIVE, STRONG STAINING INTENSITY Progesterone Receptor: 100%, POSITIVE, STRONG STAINING INTENSITY Proliferation Marker Ki67: 5%   11/17/2019 Initial Diagnosis   Malignant neoplasm of upper-outer quadrant of right breast in female, estrogen receptor positive ([place])    11/25/2019 Breast MRI   IMPRESSION: 1. Large area of non-mass enhancement involving the UPPER OUTER QUADRANT of the RIGHT breast measuring approximately 4.2 x 4.1 x 2.4 cm. The biopsy-proven IDC and DCIS is present at the superomedial aspect of this NME. 2. No MRI evidence of malignancy involving the LEFT breast. 3. Intact BILATERAL retropectoral implants. 4. 2 pathologically enlarged RIGHT axillary lymph nodes. One of these nodes is biopsy-proven metastatic disease. No pathologic lymphadenopathy elsewhere   11/25/2019 PET scan   IMPRESSION: 1. Two mildly enlarged hypermetabolic right axillary lymph nodes compatible with right axillary nodal metastases. 2. No additional sites of hypermetabolic metastatic disease. 3. Asymmetric indistinct upper right breast  hypermetabolism, without discrete mass correlate on the CT images, compatible  with known primary right breast malignancy.     11/26/2019 Genetic Testing   Negative genetic testing: no pathogenic variants detected in [facility] STAT Breast Cancer Panel.  Variant of uncertain significance (VUS) detected in CHEK2 at c.1556G>T (p.Arg519Leu).  The report date is November 26, 2019.   The STAT Breast cancer panel offered by [facility] includes sequencing and rearrangement analysis for the following 9 genes:  ATM, BRCA1, BRCA2, CDH1, CHEK2, PALB2, PTEN, STK11 and TP53.    Results of [facility] Multi-Cancer Panel are pending.    12/01/2019 -  Anti-estrogen oral therapy   Neoadjuvant Tamoxifen 28m on 12/01/19. Reduced to 181mon 12/09/19.       ---Zoladex injection monthly starting 12/09/19.       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  Variants of uncertain significance detected RAD51D at c.715C>T (p.Arg239Trp) and CHEK2 at c.1556G>T (p.Arg519Leu).  The report date is December 08, 2019.   The Multi-Cancer Panel offered by [facility] includes sequencing and/or deletion duplication testing of the following 85 genes: AIP, ALK, APC, ATM, AXIN2,BAP1,  BARD1, BLM, BMPR1A, BRCA1, BRCA2, BRIP1, CASR, CDC73, CDH1, CDK4, CDKN1B, CDKN1C, CDKN2A (p14ARF), CDKN2A (p16INK4a), CEBPA, CHEK2, CTNNA1, DICER1, DIS3L2, EGFR (c.2369C>T, p.Thr790Met variant only), EPCAM (Deletion/duplication testing only), FH, FLCN, GATA2, GPC3, GREM1 (Promoter region deletion/duplication testing only), HOXB13 (c.251G>A, p.Gly84Glu), HRAS, KIT, MAX, MEN1, MET, MITF (c.952G>A, p.Glu318Lys variant only), MLH1, MSH2, MSH3, MSH6, MUTYH, NBN, NF1, NF2, NTHL1, PALB2, PDGFRA, PHOX2B, PMS2, POLD1, POLE, POT1, PRKAR1A, PTCH1, PTEN, RAD50, RAD51C, RAD51D, RB1, RECQL4, RET, RNF43, RUNX1, SDHAF2, SDHA  (sequence changes only), SDHB, SDHC, SDHD, SMAD4, SMARCA4, SMARCB1, SMARCE1, STK11, SUFU, TERC, TERT, TMEM127, TP53, TSC1, TSC2, VHL, WRN and WT1.    12/09/2019 Miscellaneous   Mammaprint luminal type A, low risk  10% risk of recurrence in 10 years with 97.8% benefor of hormaonal therapy alone.    01/12/2020 - 03/25/2020 Chemotherapy   Verzenio started on 01/12/2020, dose reduced to 503mm and 100m74m11/2022 due to poor tolerance. Held Verzenio on 03/25/20 to proceed with breast surgery.    03/01/2020 Mammogram   Targeted ultrasound is performed, showing interval decreasing conspicuity of the patient's known right breast cancer. A post biopsy clip with an associated irregular area shadowing is demonstrated at the 11 o'clock position 4 cm from the nipple. Exact measurements are difficult due to the vague appearance of the findings. It measures approximately 1.2 x 0.9 x 0.6 cm (previously 4.7 x 2.2 x 1.5 cm).   IMPRESSION: Imaging findings consistent with good response to chemotherapy.   03/16/2020 Imaging   MRI Breast  IMPRESSION: 1. Interval resolution of RIGHT axillary adenopathy. 2. Slightly smaller area of non mass enhancement in the UPPER-OUTER QUADRANT of the RIGHT breast.   04/15/2020 Surgery   RIGHT BREAST LUMPECTOMY WITH RADIOACTIVE SEED LOCALIZATION and RADIOACTIVE SEED GUIDED RIGHT AXILLARY SENTINEL LYMPH NODE DISSECTION and SENTINEL NODE BIOPSY by Dr TsueGeorgette Dover3/31/2022 Pathology Results   FINAL MICROSCOPIC DIAGNOSIS:   A. 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  - Invasive carcinoma broadly involves each the anterior, posterior,  superior and inferior margins.  - DCIS involves the inferior margin and is < 1 mm from each the anterior  and superior margins.  -  Atypical lobular hyperplasia.   - Biopsy sites.  - See oncology table.   B. TARGETED LYMPH NODE, RIGHT AXILLA, BIOPSY:  - Metastatic carcinoma in (1) of (1) lymph node.  - Biopsy site.   C. SENTINEL LYMPH NODE, RIGHT AXILLA #1, BIOPSY:  - Metastatic carcinoma in (1) of (1) lymph node.   D. SENTINEL LYMPH NODE, RIGHT AXILLA #2, BIOPSY:  - One lymph node, negative for carcinoma (0/1).    04/15/2020 Cancer Staging   Staging form: Breast, AJCC 8th Edition - Pathologic stage from 04/15/2020: No Stage Recommended (ypT2, pN2a, cM0, G2, ER+, PR+, HER2-) - Signed by Truitt Merle, MD on 05/27/2020  Stage prefix: Post-therapy  Histologic grading system: 3 grade system  Residual tumor (R): R1 - Microscopic    05/18/2020 Surgery   Right Mastectomy  A. BREAST, RIGHT, MASTECTOMY:  - Residual invasive carcinoma with mixed ductal and lobular features.       Residual invasive carcinoma broadly involves the anterior inferior  soft tissue margin.       Residual invasive carcinoma is focally < 1 mm from the deep margin.  - Residual ductal carcinoma in situ.       Residual DCIS is 3 mm from the closest margin, deep.  - Residual atypical lobular hyperplasia.  - Prior procedure site changes.  - Biopsy clip (X2).   B. BREAST IMPLANT, RIGHT, EXPLANTATION:  - Implant (gross only).   C. LYMPH NODES, RIGHT AXILLA, DISSECTION:  - Metastatic carcinoma in (2) of (12) lymph nodes.  - Prior procedure site changes in surrounding soft tissue.    06/24/2020 -  Chemotherapy    Patient is on Treatment Plan: BREAST ADJUVANT DOSE DENSE AC Q14D / PACLITAXEL Q7D         CURRENT THERAPY: Weekly Taxol x12, starting 07/08/2020  INTERVAL HISTORY: Ms. Mickle returns for follow-up and treatment as scheduled.  She was seen last week and completed zoladex and cycle 3 of weekly Taxol.  She felt worse when she got the chemo and injection together.  She wonders if she can stop Zoladex early.  She continues to have similar fatigue, body aches, nausea,  vomiting, and constipation.  She feels okay for 4-5 days before the next cycle.  Neuropathy occurs mainly in the morning, a little more intense but still intermittent, mild, and not limiting function.  She takes gabapentin periodically and cryotherapy during Taxol.  Denies fever, chills, cough, chest pain, dyspnea, or other new concerns.    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   Pelvic pain 09/2008    SURGICAL HISTORY: Past Surgical History:  Procedure Laterality Date   AUGMENTATION MAMMAPLASTY Bilateral 1962   silicone    BREAST IMPLANT REMOVAL Right 05/18/2020   Procedure: REMOVAL OF RIGHT BREAST IMPLANT;  Surgeon: Donnie Mesa, MD;  Location: [facility];  Service: General;  Laterality: Right;   BREAST LUMPECTOMY WITH RADIOACTIVE SEED LOCALIZATION Right 04/15/2020   Procedure: RIGHT BREAST LUMPECTOMY WITH RADIOACTIVE SEED LOCALIZATION;  Surgeon: Donnie Mesa, MD;  Location: [place];  Service: General;  Laterality: Right;   MODIFIED MASTECTOMY Right 05/18/2020   Procedure: RIGHT MODIFIED RADICAL MASTECTOMY;  Surgeon: Donnie Mesa, MD;  Location: [place];  Service: General;  Laterality: Right;   PORTACATH PLACEMENT Left 06/10/2020   Procedure: INSERTION PORT-A-CATH;  Surgeon: Donnie Mesa, MD;  Location: [place];  Service: General;  Laterality: Left;  45 MINUTES ROOM 2   RADIOACTIVE SEED GUIDED AXILLARY  SENTINEL LYMPH NODE Right 04/15/2020   Procedure: RADIOACTIVE SEED GUIDED RIGHT AXILLARY SENTINEL LYMPH NODE DISSECTION;  Surgeon: Donnie Mesa, MD;  Location: [place];  Service: General;  Laterality: Right;   SENTINEL NODE BIOPSY N/A 04/15/2020   Procedure: SENTINEL NODE BIOPSY;  Surgeon: Donnie Mesa, MD;  Location: [place];  Service: General;  Laterality: N/A;   WISDOM TOOTH EXTRACTION      I have reviewed the social history and family history with the patient and they are unchanged from previous note.  ALLERGIES:  is allergic to ibuprofen.  MEDICATIONS:  Current Outpatient Medications  Medication Sig Dispense Refill   acetaminophen (TYLENOL) 500 MG tablet Take 500 mg by mouth every 6 (six) hours as needed for moderate pain.     clindamycin (CLINDAGEL) 1 % gel Apply topically 2 (two) times daily. 30 g 0   gabapentin (NEURONTIN) 100 MG capsule TAKE 2 CAPSULES(200 MG) BY MOUTH AT BEDTIME 60 capsule 1   lidocaine (LIDODERM) 5 % Place 1 patch onto the skin daily. Remove & Discard patch within 12 hours or as directed by MD 30 patch 0   lidocaine-prilocaine (EMLA) cream Apply to affected area once (Patient taking differently: Apply 1 application topically daily as needed (port access).) 30 g 3   magnesium oxide (MAG-OX) 400 (240 Mg) MG tablet Take 1 tablet (400 mg total) by mouth daily. (Patient not taking: Reported on 07/22/2020) 20 tablet 0   ondansetron (ZOFRAN) 8 MG tablet Take 1 tablet (8 mg total) by mouth every 8 (eight) hours as needed. Start on the third day after chemotherapy. 30 tablet 2   pantoprazole (PROTONIX) 40 MG tablet Take 1 tablet (40 mg total) by mouth daily. 30 tablet 3   prochlorperazine (COMPAZINE) 10 MG tablet TAKE 1 TABLET(10 MG) BY MOUTH EVERY 6 HOURS AS NEEDED FOR NAUSEA OR VOMITING 30 tablet 3   traMADol (ULTRAM) 50 MG tablet Take 1 tablet (50 mg total) by mouth every 6 (six) hours as needed (mild pain). (Patient not taking:  Reported on 07/22/2020) 30 tablet 0   venlafaxine (EFFEXOR) 37.5 MG tablet Take 1 tablet (37.5 mg total) by mouth daily. 30 tablet 30   No current facility-administered medications for this visit.    PHYSICAL EXAMINATION: ECOG PERFORMANCE STATUS: 1 - Symptomatic but completely ambulatory  Vitals:   07/28/20 1130  BP: 93/82  Pulse: 77  Resp: 20  Temp: 98.7 F (37.1 C)  SpO2: 100%   Filed Weights   07/28/20 1130  Weight: 127 lb 12.8 oz (58 kg)    GENERAL:alert, no distress and comfortable SKIN: No rash EYES: sclera clear LUNGS:  normal breathing effort HEART: no lower extremity edema NEURO: alert & oriented x 3 with fluent speech, no focal motor deficits.  Normal peripheral vibratory sense over the fingertips per tuning  fork exam PAC without erythema  LABORATORY DATA:  I have reviewed the data as listed CBC Latest Ref Rng & Units 07/28/2020 07/22/2020 07/15/2020  WBC 4.0 - 10.5 K/uL 3.9(L) 4.0 6.3  Hemoglobin 12.0 - 15.0 g/dL 10.8(L) 10.9(L) 10.4(L)  Hematocrit 36.0 - 46.0 % 32.1(L) 32.4(L) 30.3(L)  Platelets 150 - 400 K/uL 293 284 322     CMP Latest Ref Rng & Units 07/28/2020 07/22/2020 07/15/2020  Glucose 70 - 99 mg/dL 84 85 95  BUN 6 - 20 mg/dL 10 9 9   Creatinine 0.44 - 1.00 mg/dL 0.64 0.67 0.70  Sodium 135 - 145 mmol/L 141 142 140  Potassium 3.5 - 5.1 mmol/L 4.1 3.9 4.1  Chloride 98 - 111 mmol/L 105 105 105  CO2 22 - 32 mmol/L 27 26 25   Calcium 8.9 - 10.3 mg/dL 9.8 9.8 9.8  Total Protein 6.5 - 8.1 g/dL 7.4 7.5 7.5  Total Bilirubin 0.3 - 1.2 mg/dL 0.5 0.4 0.3  Alkaline Phos 38 - 126 U/L 87 94 90  AST 15 - 41 U/L 144(H) 157(H) 56(H)  ALT 0 - 44 U/L 329(HH) 239(H) 83(H)      RADIOGRAPHIC STUDIES: I have personally reviewed the radiological images as listed and agreed with the findings in the report. No results found.   ASSESSMENT & PLAN: Caitlyn Miller is a 35 y.o. female with   1. Malignant neoplasm of upper-outer quadrant of right breast, Stage IIA, c(T2N1M0),  ER+/PR+/HER2-, Grade II, Mammaprint luminal type A, low risk -She has a 4.7cm right breast mass at 11:00 position and two abnormal right axillary LN. 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 -Her 11/2019 breast MRI and PET scan negative for contralateral malignancy or distant metastasis  -due to her large tumor, she needs mastectomy if she proceeded with surgery first.  She has been seen by Dr. Georgette Dover. Her mastectomy will be complicated by history of implant placement. -MammaPrint showed luminal type A, low risk disease, consistent with strongly ER and PR positivity, negative HER-2 and low Ki-67.  She is unlikely to benefit from chemotherapy, and neoadjuvant chemotherapy is not recommended.  -She started neoadjuvant tamoxifen on 12/01/19, tolerated poorly with nausea, and diffuse body aches. Dose reduced to 34m from 12/09/19.  -Started monthly Zoladex injection on 12/09/19, moderate hot flashes stable on gabapentin and effexor.  She switched to AI in 12/2019 but on hold with IV chemo -03/01/2020 ultrasound/mammogram showed good response to treatment, she underwent right lumpectomy and sentinel LN dissection on 04/15/2020, path showed larger than expected tumor 3.1 cm with mixed ductal and lobular features and DCIS, she had positive margin and 2/3 positive lymph nodes -She subsequently underwent mastectomy and axillary node dissection on 05/18/2020 by Dr. TGeorgette Dover path showed residual invasive carcinoma and DCIS with positive margin and another 2/12 positive nodes.  Overall stage III disease -Her treatment plan includes adjuvant chemo, postmastectomy radiation and anti-estrogen -Tolerated cycle 1 AC poorly, she declined more chemo with AC and moved to weekly Taxol starting 07/08/2020   2. Generalized Weakness, body pain, Fatigue -Pt notes for the past 3 years she has had mid chest and mid back pain. Previously  intermittent, now constant. She also notes b/l arm and leg weakness and pain along with tingling of her toes and fingers. -This has lead to fatigue, low appetite, decreased daily function/activity. -[place]. They  said this is related to stress, anxiety or depression. Pt feels she has an illness causing this instead. -Her 11/25/19 PET was negative for any distant malignancy or metastasis.  -Worse with AC, improved on Taxol    3. Anxiety, depression -More anxious than depressed -Weaned off Lexapro and has started Effexor which is helpful.  Can increase if needed for mood and worsening hot flashes   4. 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   -She has discussed with our genetic counselor   5. Social Support -She is from [place] and lives both there and in [place] with her husband. She has 2 teenage children. The rest of her family is in [place].  -Her mother is here now, but will go home and then return later  Disposition: Ms. Udall appears stable.  She continues monthly Zoladex and weekly Taxol s/p cycle 3. She tolerates moderately well with stable fatigue, body aches, n/v/c, and neuropathy. No neuro deficits on exam. I recommend to continue cryotherapy with taxol, gabapentin PRN, and start B complex vitamin. Side effects are adequately managed with supportive care at home. She is able to recover and function mostly well.   We reviewed the rationale for zoladex, which is ovarian suppression and fertility preservation on chemo. She agrees to continue for now.  Chemo is scheduled for tomorrow, CMP resulted after she left which shows AST 144 and ALT 329, normal alk phos and Tbili. CBC is stable. I recommend ABD [place] for further evaluation. If negative, this is likely secondary to chemo/taxol and we will  likely dose reduce with cycle 4 tomorrow.   I plan to review with Dr. Burr Medico upon her return to clinic 07/29/20.    Orders Placed This Encounter  Procedures   US Abdomen Complete    Standing Status:   Future    Standing Expiration Date:   07/28/2021    Order Specific Question:   Reason for Exam (SYMPTOM  OR DIAGNOSIS REQUIRED)    Answer:   breast cancer on chemo, worsening LFTs    Order Specific Question:   Preferred imaging location?    Answer:   [facility]    All questions were answered. The patient knows to call the clinic with any problems, questions or concerns. No barriers to learning were detected.     Alla Feeling, NP 07/28/20

## 2020-07-27 NOTE — Telephone Encounter (Signed)
This nurse spoke with patients husband who wanted to verify appointments for 7/13.  This nurse advised that patient has port flush and labs at 1030, then office visit with NP at 57.  Informed that patient has infusion on  7/4.  He acknowledged understanding.  No further questions or concerns at this time.

## 2020-07-28 ENCOUNTER — Telehealth: Payer: Self-pay

## 2020-07-28 ENCOUNTER — Inpatient Hospital Stay: Payer: 59

## 2020-07-28 ENCOUNTER — Other Ambulatory Visit: Payer: Self-pay

## 2020-07-28 ENCOUNTER — Encounter: Payer: Self-pay | Admitting: Nurse Practitioner

## 2020-07-28 ENCOUNTER — Inpatient Hospital Stay (HOSPITAL_BASED_OUTPATIENT_CLINIC_OR_DEPARTMENT_OTHER): Payer: 59 | Admitting: Nurse Practitioner

## 2020-07-28 VITALS — BP 93/82 | HR 77 | Temp 98.7°F | Resp 20 | Wt 127.8 lb

## 2020-07-28 DIAGNOSIS — R7401 Elevation of levels of liver transaminase levels: Secondary | ICD-10-CM

## 2020-07-28 DIAGNOSIS — Z170 Estrogen receptor positive status [ER+]: Secondary | ICD-10-CM | POA: Diagnosis not present

## 2020-07-28 DIAGNOSIS — C50411 Malignant neoplasm of upper-outer quadrant of right female breast: Secondary | ICD-10-CM

## 2020-07-28 DIAGNOSIS — Z5111 Encounter for antineoplastic chemotherapy: Secondary | ICD-10-CM | POA: Diagnosis not present

## 2020-07-28 DIAGNOSIS — Z95828 Presence of other vascular implants and grafts: Secondary | ICD-10-CM

## 2020-07-28 LAB — CMP (CANCER CENTER ONLY)
ALT: 329 U/L (ref 0–44)
AST: 144 U/L — ABNORMAL HIGH (ref 15–41)
Albumin: 4 g/dL (ref 3.5–5.0)
Alkaline Phosphatase: 87 U/L (ref 38–126)
Anion gap: 9 (ref 5–15)
BUN: 10 mg/dL (ref 6–20)
CO2: 27 mmol/L (ref 22–32)
Calcium: 9.8 mg/dL (ref 8.9–10.3)
Chloride: 105 mmol/L (ref 98–111)
Creatinine: 0.64 mg/dL (ref 0.44–1.00)
GFR, Estimated: >60 mL/min (ref >60)
Glucose, Bld: 84 mg/dL (ref 70–99)
Potassium: 4.1 mmol/L (ref 3.5–5.1)
Sodium: 141 mmol/L (ref 135–145)
Total Bilirubin: 0.5 mg/dL (ref 0.3–1.2)
Total Protein: 7.4 g/dL (ref 6.5–8.1)

## 2020-07-28 LAB — CBC WITH DIFFERENTIAL (CANCER CENTER ONLY)
Abs Immature Granulocytes: 0.03 10*3/uL (ref 0.00–0.07)
Basophils Absolute: 0.1 10*3/uL (ref 0.0–0.1)
Basophils Relative: 1 %
Eosinophils Absolute: 0.1 10*3/uL (ref 0.0–0.5)
Eosinophils Relative: 1 %
HCT: 32.1 % — ABNORMAL LOW (ref 36.0–46.0)
Hemoglobin: 10.8 g/dL — ABNORMAL LOW (ref 12.0–15.0)
Immature Granulocytes: 1 %
Lymphocytes Relative: 45 %
Lymphs Abs: 1.7 10*3/uL (ref 0.7–4.0)
MCH: 31.2 pg (ref 26.0–34.0)
MCHC: 33.6 g/dL (ref 30.0–36.0)
MCV: 92.8 fL (ref 80.0–100.0)
Monocytes Absolute: 0.2 10*3/uL (ref 0.1–1.0)
Monocytes Relative: 6 %
Neutro Abs: 1.8 10*3/uL (ref 1.7–7.7)
Neutrophils Relative %: 46 %
Platelet Count: 293 10*3/uL (ref 150–400)
RBC: 3.46 MIL/uL — ABNORMAL LOW (ref 3.87–5.11)
RDW: 13.2 % (ref 11.5–15.5)
WBC Count: 3.9 10*3/uL — ABNORMAL LOW (ref 4.0–10.5)
nRBC: 0 % (ref 0.0–0.2)

## 2020-07-28 MED ORDER — SODIUM CHLORIDE 0.9% FLUSH
10.0000 mL | INTRAVENOUS | Status: DC | PRN
Start: 2020-07-28 — End: 2020-07-28
  Administered 2020-07-28: 10 mL via INTRAVENOUS
  Filled 2020-07-28: qty 10

## 2020-07-28 MED ORDER — HEPARIN SOD (PORK) LOCK FLUSH 100 UNIT/ML IV SOLN
500.0000 [IU] | Freq: Once | INTRAVENOUS | Status: AC
  Administered 2020-07-28: 500 [IU] via INTRAVENOUS
  Filled 2020-07-28: qty 5

## 2020-07-28 MED FILL — Dexamethasone Sodium Phosphate Inj 100 MG/10ML: INTRAMUSCULAR | Qty: 1 | Status: AC

## 2020-07-28 NOTE — Telephone Encounter (Signed)
Per Cira Rue, NP I have scheduled the patient to have a STAT US Abdomen limited RUQ (Liver/Gb) on 7/14 at the [facility]. I contacted the patient's Spouse per DPR and advised him to make sure that she arrives through the ER at [facility] by 6:45 am for the 7 am [place] and NPO 4 hours prior. The Spouse verbalized understanding and requested that I send him this information through [place].

## 2020-07-28 NOTE — Patient Instructions (Signed)

## 2020-07-28 NOTE — Telephone Encounter (Signed)
CRITICAL VALUE STICKER  CRITICAL VALUE: ALT 329  RECEIVER (on-site recipient of call): Loray Akard P. LPN  DATE & TIME NOTIFIED: 07/28/20 12:00pm   MESSENGER (representative from lab): Suanne Marker  MD NOTIFIED: Cira Rue, NP   TIME OF NOTIFICATION: 12:00pm

## 2020-07-28 NOTE — Addendum Note (Signed)
Addended by: Alla Feeling on: 07/28/2020 03:10 PM   Modules accepted: Orders

## 2020-07-28 NOTE — Progress Notes (Signed)
STAT US Abdomen Limited RUQ

## 2020-07-29 ENCOUNTER — Telehealth: Payer: Self-pay

## 2020-07-29 ENCOUNTER — Ambulatory Visit (HOSPITAL_COMMUNITY)
Admission: RE | Admit: 2020-07-29 | Discharge: 2020-07-29 | Disposition: A | Discharge status: Home / Self Care | Payer: 59 | Source: Ambulatory Visit | Attending: Nurse Practitioner | Admitting: Nurse Practitioner

## 2020-07-29 ENCOUNTER — Ambulatory Visit: Payer: 59

## 2020-07-29 DIAGNOSIS — R7401 Elevation of levels of liver transaminase levels: Secondary | ICD-10-CM

## 2020-07-29 IMAGING — US US ABDOMEN LIMITED
1 series · 14 of 25 positions shown · non-contrast
Comparison: CT abdomen pelvis [DATE]

CLINICAL DATA: Elevated liver function test

EXAM:
ULTRASOUND ABDOMEN LIMITED RIGHT UPPER QUADRANT

[Series 1: us abdomen limited ruq (liver/gb) · 14 of 49 slices shown]
[im 1/49]
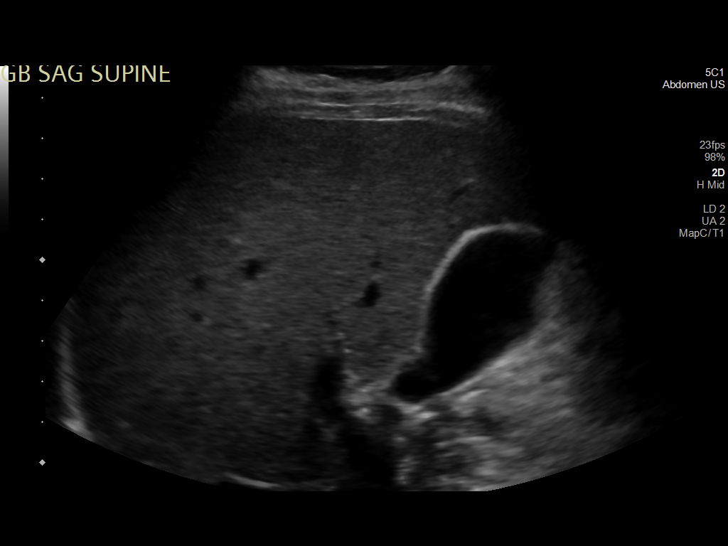
[im 5/49]
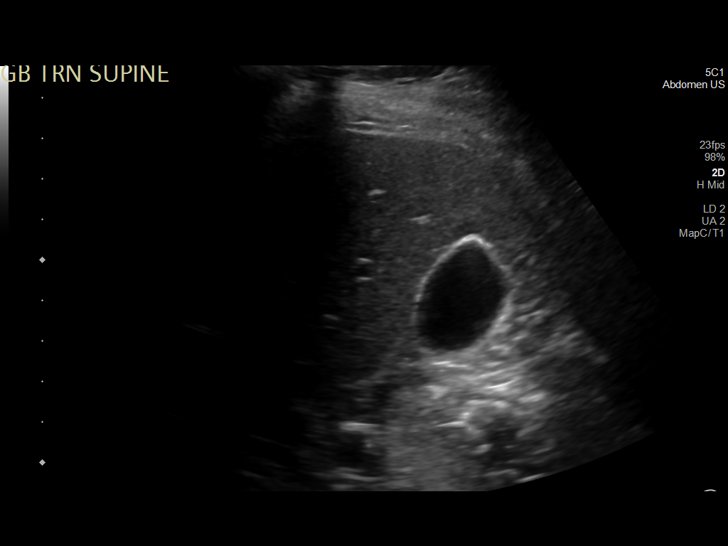
[im 9/49]
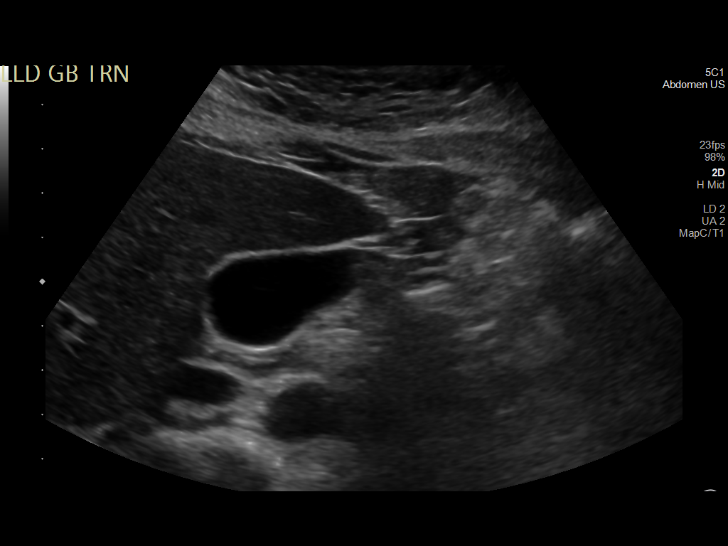
[im 13/49]
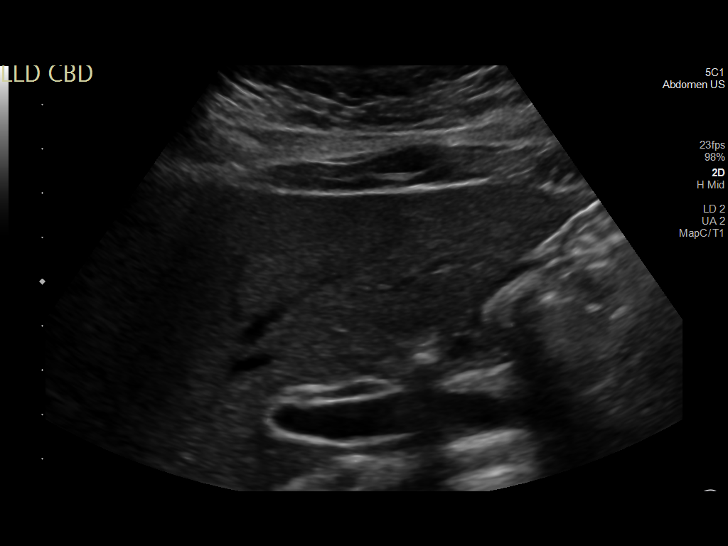
[im 17/49]
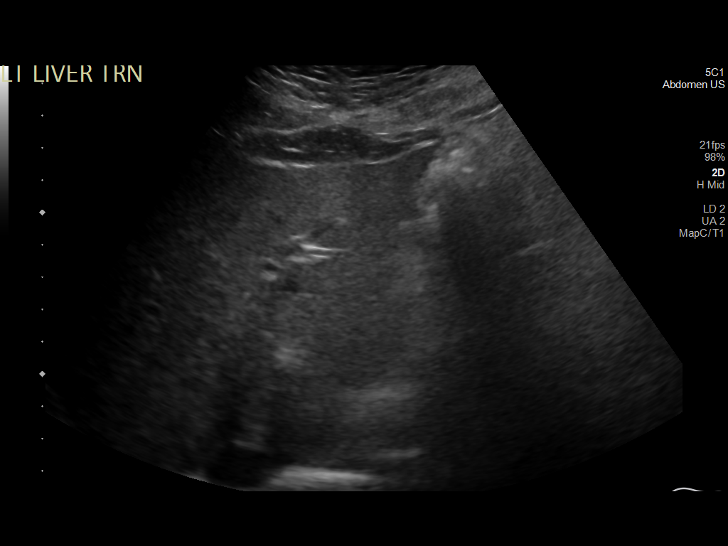
[im 19/49]
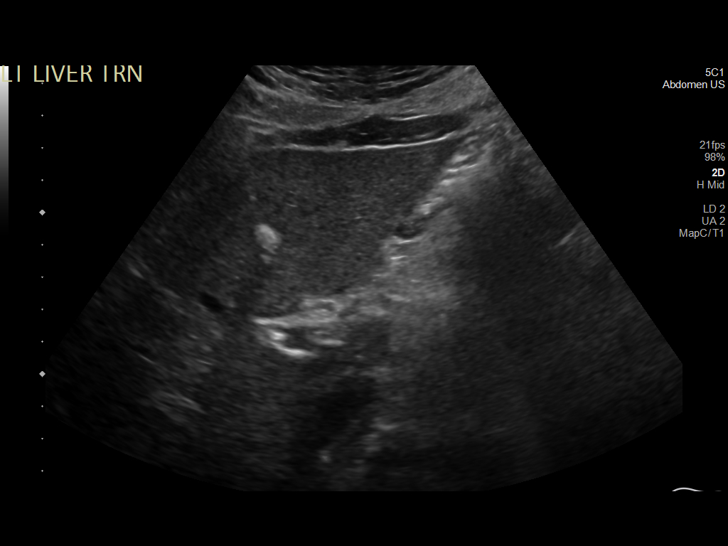
[im 23/49]
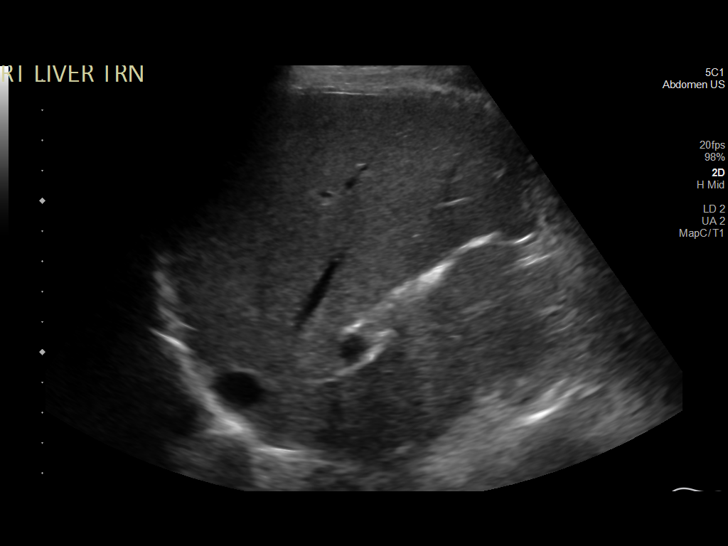
[im 27/49]
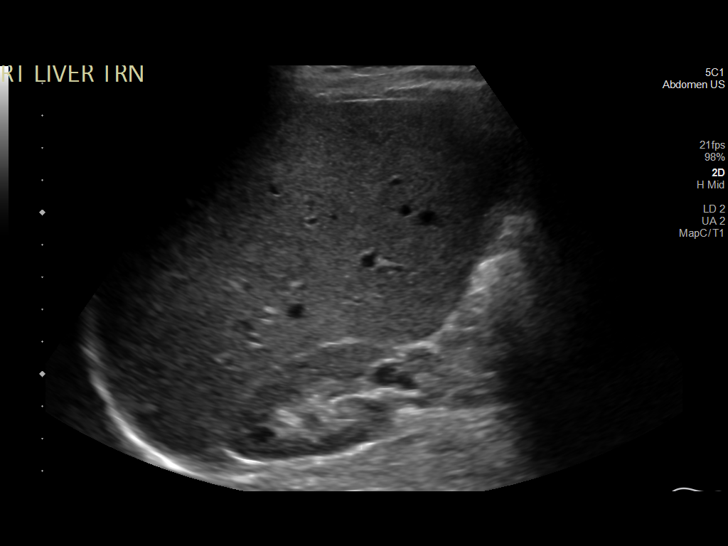
[im 31/49]
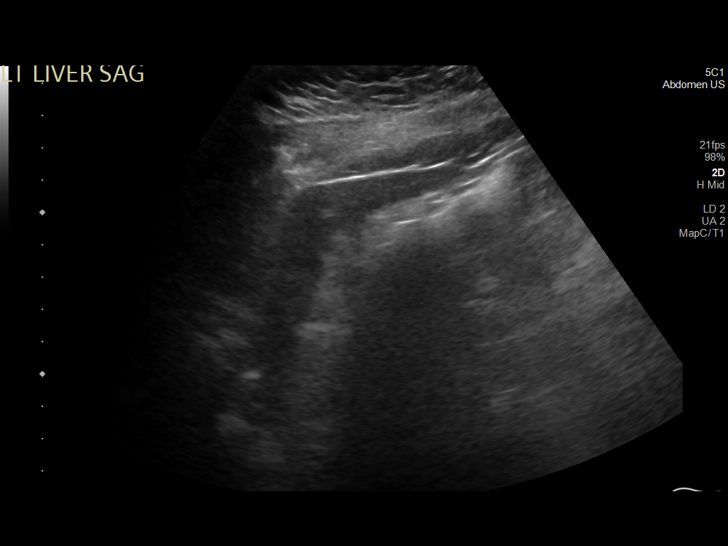
[im 33/49]
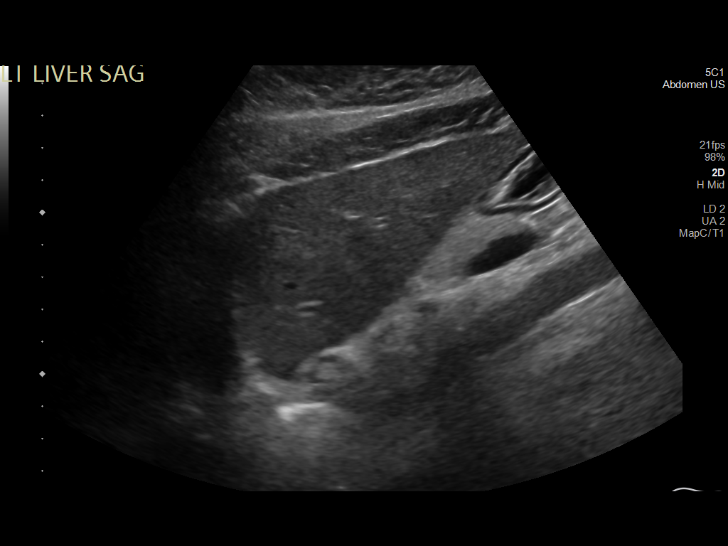
[im 37/49]
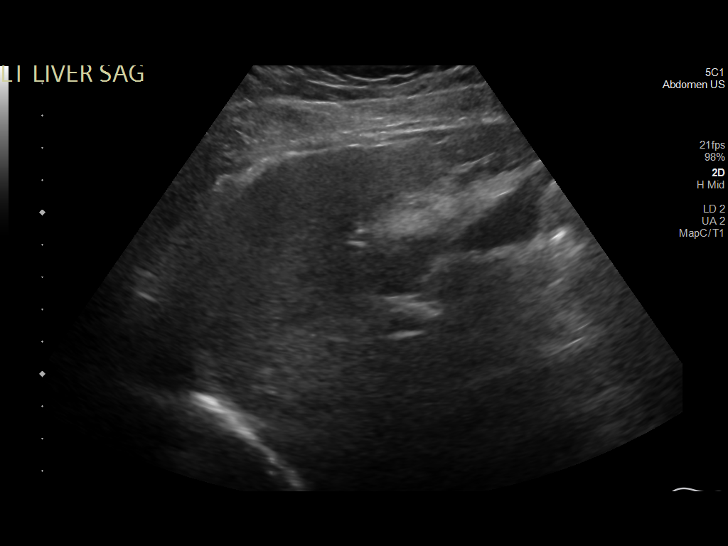
[im 41/49]
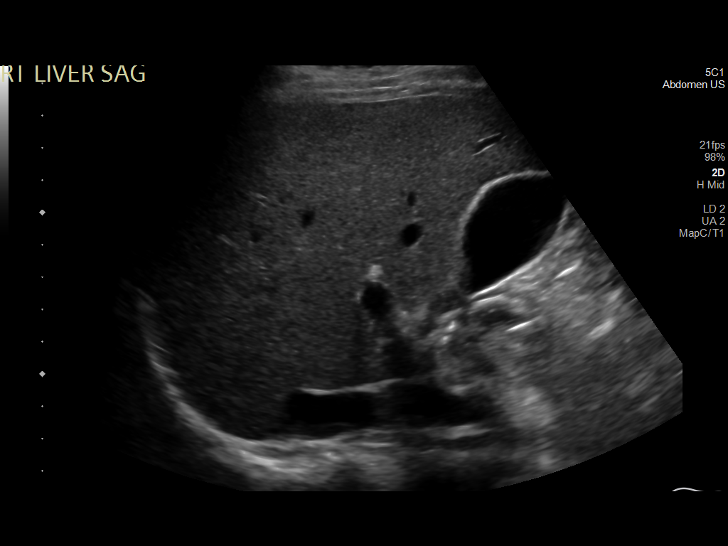
[im 45/49]
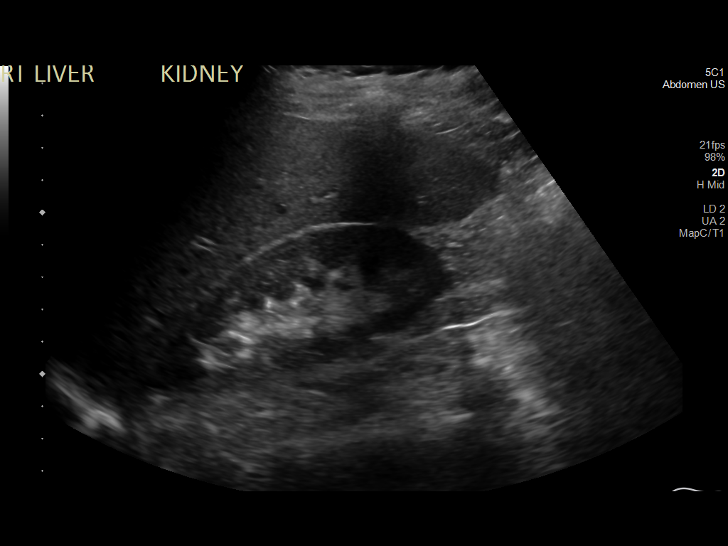
[im 49/49]
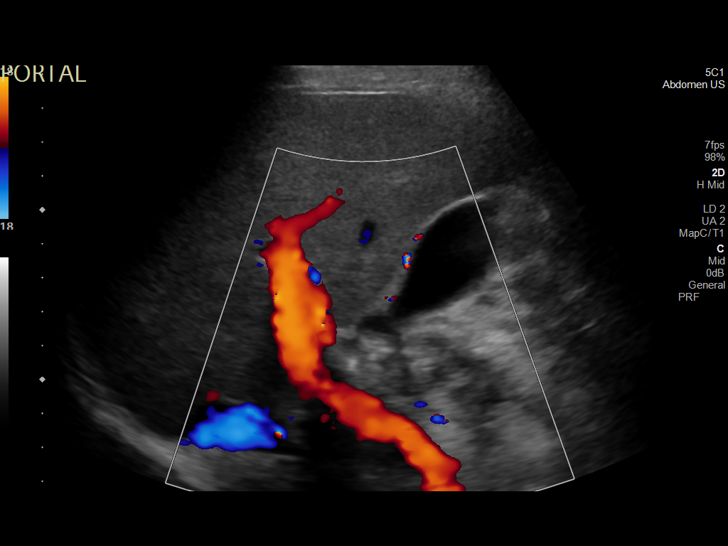

[14 of 25 positions shown; findings below may reference images not displayed]

FINDINGS: Gallbladder:

No gallstones or wall thickening visualized. No sonographic Murphy
sign noted by sonographer.

Common bile duct:

Diameter: 2 mm

Liver:

No focal lesion identified. Within normal limits in parenchymal
echogenicity. Portal vein is patent on color Doppler imaging with
normal direction of blood flow towards the liver.

Other: None.
IMPRESSION: No significant sonographic abnormality of the liver or gallbladder

## 2020-07-29 NOTE — Telephone Encounter (Signed)
This nurse spoke with patients husband.  Made aware that patients chemo this week will be canceled, restart next week. Abnormal LFT may be coming from chemo.  Also made aware that her  [place] today was good. No further questions or concerns at this time.

## 2020-08-03 ENCOUNTER — Ambulatory Visit: Payer: 59 | Attending: Rehabilitation | Admitting: Rehabilitation

## 2020-08-04 ENCOUNTER — Encounter: Payer: Self-pay | Admitting: *Deleted

## 2020-08-05 ENCOUNTER — Encounter: Payer: Self-pay | Admitting: Hematology

## 2020-08-05 ENCOUNTER — Inpatient Hospital Stay: Payer: 59

## 2020-08-05 ENCOUNTER — Other Ambulatory Visit: Payer: Self-pay

## 2020-08-05 ENCOUNTER — Encounter: Payer: Self-pay | Admitting: *Deleted

## 2020-08-05 ENCOUNTER — Inpatient Hospital Stay (HOSPITAL_BASED_OUTPATIENT_CLINIC_OR_DEPARTMENT_OTHER): Payer: 59 | Admitting: Hematology

## 2020-08-05 VITALS — BP 110/74 | HR 97 | Temp 98.7°F | Resp 16 | Ht 64.0 in | Wt 130.0 lb

## 2020-08-05 DIAGNOSIS — Z5111 Encounter for antineoplastic chemotherapy: Secondary | ICD-10-CM | POA: Diagnosis not present

## 2020-08-05 DIAGNOSIS — C50411 Malignant neoplasm of upper-outer quadrant of right female breast: Secondary | ICD-10-CM

## 2020-08-05 DIAGNOSIS — Z170 Estrogen receptor positive status [ER+]: Secondary | ICD-10-CM

## 2020-08-05 DIAGNOSIS — Z95828 Presence of other vascular implants and grafts: Secondary | ICD-10-CM

## 2020-08-05 LAB — CMP (CANCER CENTER ONLY)
ALT: 352 U/L (ref 0–44)
AST: 195 U/L (ref 15–41)
Albumin: 4.2 g/dL (ref 3.5–5.0)
Alkaline Phosphatase: 101 U/L (ref 38–126)
Anion gap: 9 (ref 5–15)
BUN: 13 mg/dL (ref 6–20)
CO2: 28 mmol/L (ref 22–32)
Calcium: 9.8 mg/dL (ref 8.9–10.3)
Chloride: 105 mmol/L (ref 98–111)
Creatinine: 0.49 mg/dL (ref 0.44–1.00)
GFR, Estimated: >60 mL/min (ref >60)
Glucose, Bld: 111 mg/dL — ABNORMAL HIGH (ref 70–99)
Potassium: 3.6 mmol/L (ref 3.5–5.1)
Sodium: 142 mmol/L (ref 135–145)
Total Bilirubin: 0.5 mg/dL (ref 0.3–1.2)
Total Protein: 7.4 g/dL (ref 6.5–8.1)

## 2020-08-05 LAB — CBC WITH DIFFERENTIAL (CANCER CENTER ONLY)
Abs Immature Granulocytes: 0.02 10*3/uL (ref 0.00–0.07)
Basophils Absolute: 0 10*3/uL (ref 0.0–0.1)
Basophils Relative: 1 %
Eosinophils Absolute: 0.1 10*3/uL (ref 0.0–0.5)
Eosinophils Relative: 1 %
HCT: 32.5 % — ABNORMAL LOW (ref 36.0–46.0)
Hemoglobin: 11.1 g/dL — ABNORMAL LOW (ref 12.0–15.0)
Immature Granulocytes: 0 %
Lymphocytes Relative: 39 %
Lymphs Abs: 1.9 10*3/uL (ref 0.7–4.0)
MCH: 31.2 pg (ref 26.0–34.0)
MCHC: 34.2 g/dL (ref 30.0–36.0)
MCV: 91.3 fL (ref 80.0–100.0)
Monocytes Absolute: 0.5 10*3/uL (ref 0.1–1.0)
Monocytes Relative: 10 %
Neutro Abs: 2.4 10*3/uL (ref 1.7–7.7)
Neutrophils Relative %: 49 %
Platelet Count: 262 10*3/uL (ref 150–400)
RBC: 3.56 MIL/uL — ABNORMAL LOW (ref 3.87–5.11)
RDW: 13.6 % (ref 11.5–15.5)
WBC Count: 4.9 10*3/uL (ref 4.0–10.5)
nRBC: 0 % (ref 0.0–0.2)

## 2020-08-05 MED ORDER — HEPARIN SOD (PORK) LOCK FLUSH 100 UNIT/ML IV SOLN
500.0000 [IU] | Freq: Once | INTRAVENOUS | Status: AC | PRN
  Administered 2020-08-05: 500 [IU]
  Filled 2020-08-05: qty 5

## 2020-08-05 MED ORDER — SODIUM CHLORIDE 0.9 % IV SOLN
80.0000 mg/m2 | Freq: Once | INTRAVENOUS | Status: AC
  Administered 2020-08-05: 132 mg via INTRAVENOUS
  Filled 2020-08-05: qty 22

## 2020-08-05 MED ORDER — DIPHENHYDRAMINE HCL 50 MG/ML IJ SOLN
25.0000 mg | Freq: Once | INTRAMUSCULAR | Status: AC
  Administered 2020-08-05: 25 mg via INTRAVENOUS

## 2020-08-05 MED ORDER — DIPHENHYDRAMINE HCL 50 MG/ML IJ SOLN
INTRAMUSCULAR | Status: AC
  Filled 2020-08-05: qty 1

## 2020-08-05 MED ORDER — FAMOTIDINE 20 MG IN NS 100 ML IVPB
INTRAVENOUS | Status: AC
  Filled 2020-08-05: qty 100

## 2020-08-05 MED ORDER — SODIUM CHLORIDE 0.9% FLUSH
10.0000 mL | INTRAVENOUS | Status: DC | PRN
  Administered 2020-08-05: 10 mL
  Filled 2020-08-05: qty 10

## 2020-08-05 MED ORDER — SODIUM CHLORIDE 0.9 % IV SOLN
Freq: Once | INTRAVENOUS | Status: AC
  Filled 2020-08-05: qty 250

## 2020-08-05 MED ORDER — SODIUM CHLORIDE 0.9 % IV SOLN
10.0000 mg | Freq: Once | INTRAVENOUS | Status: AC
  Administered 2020-08-05: 10 mg via INTRAVENOUS
  Filled 2020-08-05: qty 10

## 2020-08-05 MED ORDER — FAMOTIDINE 20 MG IN NS 100 ML IVPB
20.0000 mg | Freq: Once | INTRAVENOUS | Status: AC
  Administered 2020-08-05: 20 mg via INTRAVENOUS

## 2020-08-05 MED ORDER — SODIUM CHLORIDE 0.9% FLUSH
10.0000 mL | INTRAVENOUS | Status: AC | PRN
  Administered 2020-08-05: 10 mL
  Filled 2020-08-05: qty 10

## 2020-08-05 NOTE — Progress Notes (Signed)
Per Dr. Burr Medico, "OK To Treat with elevated AST & ALT".

## 2020-08-05 NOTE — Progress Notes (Signed)
[place]   [phone] [phone]   Clinic Follow up Note   Patient Care Team: [name], Lilia Argue, MD as PCP - General (Family Medicine) Mansouraty, Telford Nab., MD as Consulting Physician (Gastroenterology) Mauro Kaufmann, RN as Oncology Nurse Navigator Rockwell Germany, RN as Oncology Nurse Navigator Donnie Mesa, MD as Consulting Physician (General Surgery) Truitt Merle, MD as Consulting Physician (Hematology) Kyung Rudd, MD as Consulting Physician (Radiation Oncology) Contogiannis, Audrea Muscat, MD as Consulting Physician (Plastic Surgery)  Date of Service:  08/05/2020  CHIEF COMPLAINT: f/u of right breast cancer  SUMMARY OF ONCOLOGIC HISTORY: Oncology History Overview Note  Cancer Staging Malignant neoplasm of upper-outer quadrant of right breast in female, estrogen receptor positive ([place]) Staging form: Breast, AJCC 8th Edition - Clinical stage from 11/12/2019: Stage IIA (cT2, cN1, cM0, G2, ER+, PR+, HER2-) - Signed by Truitt Merle, MD on 11/19/2019 Stage prefix: Initial diagnosis Histologic grading system: 3 grade system - Pathologic stage from 04/15/2020: No Stage Recommended (ypT2, pN2a, cM0, G2, ER+, PR+, HER2-) - Signed by Truitt Merle, MD on 05/27/2020 Stage prefix: Post-therapy Histologic grading system: 3 grade system Residual tumor (R): R1 - Microscopic    Malignant neoplasm of upper-outer quadrant of right breast in female, estrogen receptor positive ([place])  11/05/2019 Mammogram   IMPRESSION: Large irregular palpable mass 4.7 x 1.4 x 1.4 cm within the upper-outer right breast 11 o'clock position, 4 cm from nipple.  There is a large area of associated coarse heterogeneous calcifications within the mass. Overall findings are concerning for breast carcinoma.   Two cortically thickened right axillary lymph nodes which are indeterminate in etiology.   11/12/2019 Cancer Staging   Staging form: Breast, AJCC 8th Edition - Clinical  stage from 11/12/2019: Stage IIA (cT2, cN1, cM0, G2, ER+, PR+, HER2-) - Signed by Truitt Merle, MD on 11/19/2019    11/12/2019 Initial Biopsy   Diagnosis 1. Breast, right, needle core biopsy, right - INVASIVE MAMMARY CARCINOMA, SEE COMMENT. - MAMMARY CARCINOMA IN SITU. 2. Lymph node, needle/core biopsy, right - METASTATIC MAMMARY CARCINOMA. Microscopic Comment 1. The carcinoma appears grade 2 and measures 16 mm in greatest linear extent. E-cadherin will be ordered. Prognostic makers will be ordered. Dr. Saralyn Pilar has reviewed the case. The case was called to [facility] on 01/13/2020.    1. E-cadherin is strongly positive consistent with a ductal phenotype.   11/12/2019 Receptors her2   1. PROGNOSTIC INDICATORS Results: IMMUNOHISTOCHEMICAL AND MORPHOMETRIC ANALYSIS PERFORMED MANUALLY The tumor cells are NEGATIVE for Her2 (0). Estrogen Receptor: 100%, POSITIVE, STRONG STAINING INTENSITY Progesterone Receptor: 100%, POSITIVE, STRONG STAINING INTENSITY Proliferation Marker Ki67: 5%   11/17/2019 Initial Diagnosis   Malignant neoplasm of upper-outer quadrant of right breast in female, estrogen receptor positive ([place])    11/25/2019 Breast MRI   IMPRESSION: 1. Large area of non-mass enhancement involving the UPPER OUTER QUADRANT of the RIGHT breast measuring approximately 4.2 x 4.1 x 2.4 cm. The biopsy-proven IDC and DCIS is present at the superomedial aspect of this NME. 2. No MRI evidence of malignancy involving the LEFT breast. 3. Intact BILATERAL retropectoral implants. 4. 2 pathologically enlarged RIGHT axillary lymph nodes. One of these nodes is biopsy-proven metastatic disease. No pathologic lymphadenopathy elsewhere   11/25/2019 PET scan   IMPRESSION: 1. Two mildly enlarged hypermetabolic right axillary lymph nodes compatible with right axillary nodal metastases. 2. No additional sites of hypermetabolic metastatic disease. 3. Asymmetric indistinct upper  right breast hypermetabolism, without discrete mass  correlate on the CT images, compatible with known primary right breast malignancy.     11/26/2019 Genetic Testing   Negative genetic testing: no pathogenic variants detected in [facility] STAT Breast Cancer Panel.  Variant of uncertain significance (VUS) detected in CHEK2 at c.1556G>T (p.Arg519Leu).  The report date is November 26, 2019.   The STAT Breast cancer panel offered by [facility] includes sequencing and rearrangement analysis for the following 9 genes:  ATM, BRCA1, BRCA2, CDH1, CHEK2, PALB2, PTEN, STK11 and TP53.    Results of [facility] Multi-Cancer Panel are pending.    12/01/2019 -  Anti-estrogen oral therapy   Neoadjuvant Tamoxifen 29m on 12/01/19. Reduced to 124mon 12/09/19.       ---Zoladex injection monthly starting 12/09/19.       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  Variants of uncertain significance detected RAD51D at c.715C>T (p.Arg239Trp) and CHEK2 at c.1556G>T (p.Arg519Leu).  The report date is December 08, 2019.   The Multi-Cancer Panel offered by [facility] includes sequencing and/or deletion duplication testing of the following 85 genes: AIP, ALK, APC, ATM, AXIN2,BAP1,  BARD1, BLM, BMPR1A, BRCA1, BRCA2, BRIP1, CASR, CDC73, CDH1, CDK4, CDKN1B, CDKN1C, CDKN2A (p14ARF), CDKN2A (p16INK4a), CEBPA, CHEK2, CTNNA1, DICER1, DIS3L2, EGFR (c.2369C>T, p.Thr790Met variant only), EPCAM (Deletion/duplication testing only), FH, FLCN, GATA2, GPC3, GREM1 (Promoter region deletion/duplication testing only), HOXB13 (c.251G>A, p.Gly84Glu), HRAS, KIT, MAX, MEN1, MET, MITF (c.952G>A, p.Glu318Lys variant only), MLH1, MSH2, MSH3, MSH6, MUTYH, NBN, NF1, NF2, NTHL1, PALB2, PDGFRA, PHOX2B, PMS2, POLD1, POLE, POT1, PRKAR1A, PTCH1, PTEN, RAD50, RAD51C, RAD51D, RB1, RECQL4, RET, RNF43, RUNX1,  SDHAF2, SDHA (sequence changes only), SDHB, SDHC, SDHD, SMAD4, SMARCA4, SMARCB1, SMARCE1, STK11, SUFU, TERC, TERT, TMEM127, TP53, TSC1, TSC2, VHL, WRN and WT1.    12/09/2019 Miscellaneous   Mammaprint luminal type A, low risk  10% risk of recurrence in 10 years with 97.8% benefor of hormaonal therapy alone.    01/12/2020 - 03/25/2020 Chemotherapy   Verzenio started on 01/12/2020, dose reduced to 5011mm and 100m14m11/2022 due to poor tolerance. Held Verzenio on 03/25/20 to proceed with breast surgery.    03/01/2020 Mammogram   Targeted ultrasound is performed, showing interval decreasing conspicuity of the patient's known right breast cancer. A post biopsy clip with an associated irregular area shadowing is demonstrated at the 11 o'clock position 4 cm from the nipple. Exact measurements are difficult due to the vague appearance of the findings. It measures approximately 1.2 x 0.9 x 0.6 cm (previously 4.7 x 2.2 x 1.5 cm).   IMPRESSION: Imaging findings consistent with good response to chemotherapy.   03/16/2020 Imaging   MRI Breast  IMPRESSION: 1. Interval resolution of RIGHT axillary adenopathy. 2. Slightly smaller area of non mass enhancement in the UPPER-OUTER QUADRANT of the RIGHT breast.   04/15/2020 Surgery   RIGHT BREAST LUMPECTOMY WITH RADIOACTIVE SEED LOCALIZATION and RADIOACTIVE SEED GUIDED RIGHT AXILLARY SENTINEL LYMPH NODE DISSECTION and SENTINEL NODE BIOPSY by Dr TsueGeorgette Dover3/31/2022 Pathology Results   FINAL MICROSCOPIC DIAGNOSIS:   A. 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  - Invasive carcinoma broadly involves each the anterior, posterior,  superior and inferior margins.  - DCIS involves the inferior margin and is < 1 mm from each  the anterior  and superior margins.  - Atypical lobular  hyperplasia.  - Biopsy sites.  - See oncology table.   B. TARGETED LYMPH NODE, RIGHT AXILLA, BIOPSY:  - Metastatic carcinoma in (1) of (1) lymph node.  - Biopsy site.   C. SENTINEL LYMPH NODE, RIGHT AXILLA #1, BIOPSY:  - Metastatic carcinoma in (1) of (1) lymph node.   D. SENTINEL LYMPH NODE, RIGHT AXILLA #2, BIOPSY:  - One lymph node, negative for carcinoma (0/1).    04/15/2020 Cancer Staging   Staging form: Breast, AJCC 8th Edition - Pathologic stage from 04/15/2020: No Stage Recommended (ypT2, pN2a, cM0, G2, ER+, PR+, HER2-) - Signed by Truitt Merle, MD on 05/27/2020  Stage prefix: Post-therapy  Histologic grading system: 3 grade system  Residual tumor (R): R1 - Microscopic    05/18/2020 Surgery   Right Mastectomy  A. BREAST, RIGHT, MASTECTOMY:  - Residual invasive carcinoma with mixed ductal and lobular features.       Residual invasive carcinoma broadly involves the anterior inferior  soft tissue margin.       Residual invasive carcinoma is focally < 1 mm from the deep margin.  - Residual ductal carcinoma in situ.       Residual DCIS is 3 mm from the closest margin, deep.  - Residual atypical lobular hyperplasia.  - Prior procedure site changes.  - Biopsy clip (X2).   B. BREAST IMPLANT, RIGHT, EXPLANTATION:  - Implant (gross only).   C. LYMPH NODES, RIGHT AXILLA, DISSECTION:  - Metastatic carcinoma in (2) of (12) lymph nodes.  - Prior procedure site changes in surrounding soft tissue.    06/24/2020 -  Chemotherapy    Patient is on Treatment Plan: BREAST ADJUVANT DOSE DENSE AC Q14D / PACLITAXEL Q7D          CURRENT THERAPY:  Adjuvant Taxol, q7d, starting 07/08/20  INTERVAL HISTORY:  Caitlyn Miller is here for a follow up of breast cancer and adjuvant chemo  Chemo was held last week due to transaminitis She has been having running nose for 2 days, no fever sore throat or cough She still has muscular pain, fatigue and gastric pain from hcemo, overall  manageable   All other systems were reviewed with the patient and are negative.  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   Pelvic pain 09/2008    SURGICAL HISTORY: Past Surgical History:  Procedure Laterality Date   AUGMENTATION MAMMAPLASTY Bilateral 6384   silicone    BREAST IMPLANT REMOVAL Right 05/18/2020   Procedure: REMOVAL OF RIGHT BREAST IMPLANT;  Surgeon: Donnie Mesa, MD;  Location: [facility];  Service: General;  Laterality: Right;   BREAST LUMPECTOMY WITH RADIOACTIVE SEED LOCALIZATION Right 04/15/2020   Procedure: RIGHT BREAST LUMPECTOMY WITH RADIOACTIVE SEED LOCALIZATION;  Surgeon: Donnie Mesa, MD;  Location: [facility];  Service: General;  Laterality: Right;   MODIFIED MASTECTOMY Right 05/18/2020   Procedure: RIGHT MODIFIED RADICAL MASTECTOMY;  Surgeon: Donnie Mesa, MD;  Location: [facility];  Service: General;  Laterality: Right;   PORTACATH PLACEMENT Left 06/10/2020   Procedure: INSERTION PORT-A-CATH;  Surgeon: Donnie Mesa, MD;  Location: [facility];  Service: General;  Laterality: Left;  45 MINUTES ROOM 2   RADIOACTIVE SEED GUIDED AXILLARY SENTINEL LYMPH NODE Right 04/15/2020   Procedure: RADIOACTIVE SEED GUIDED RIGHT AXILLARY SENTINEL LYMPH NODE DISSECTION;  Surgeon: Donnie Mesa, MD;  Location: [facility];  Service: General;  Laterality: Right;   SENTINEL NODE BIOPSY N/A 04/15/2020    Procedure: SENTINEL NODE BIOPSY;  Surgeon: Donnie Mesa, MD;  Location: [facility];  Service: General;  Laterality: N/A;   WISDOM TOOTH EXTRACTION      I have reviewed the social history and family history with the patient and they are unchanged from previous note.  ALLERGIES:  is allergic to ibuprofen.  MEDICATIONS:  Current Outpatient Medications  Medication Sig Dispense Refill   acetaminophen (TYLENOL) 500 MG tablet Take 500 mg by mouth every 6 (six) hours as needed for moderate pain.     clindamycin (CLINDAGEL) 1 % gel Apply topically 2 (two) times daily. 30 g 0   gabapentin (NEURONTIN) 100 MG capsule TAKE 2 CAPSULES(200 MG) BY MOUTH AT BEDTIME 60 capsule 1   lidocaine (LIDODERM) 5 % Place 1 patch onto the skin daily. Remove & Discard patch within 12 hours or as directed by MD 30 patch 0   lidocaine-prilocaine (EMLA) cream Apply to affected area once (Patient taking differently: Apply 1 application topically daily as needed (port access).) 30 g 3   magnesium oxide (MAG-OX) 400 (240 Mg) MG tablet Take 1 tablet (400 mg total) by mouth daily. (Patient not taking: Reported on 07/22/2020) 20 tablet 0   ondansetron (ZOFRAN) 8 MG tablet Take 1 tablet (8 mg total) by mouth every 8 (eight) hours as needed. Start on the third day after chemotherapy. 30 tablet 2   pantoprazole (PROTONIX) 40 MG tablet Take 1 tablet (40 mg total) by mouth daily. 30 tablet 3   prochlorperazine (COMPAZINE) 10 MG tablet TAKE 1 TABLET(10 MG) BY MOUTH EVERY 6 HOURS AS NEEDED FOR NAUSEA OR VOMITING 30 tablet 3   traMADol (ULTRAM) 50 MG tablet Take 1 tablet (50 mg total) by mouth every 6 (six) hours as needed (mild pain). (Patient not taking: Reported on 07/22/2020) 30 tablet 0   venlafaxine (EFFEXOR) 37.5 MG tablet Take 1 tablet (37.5 mg total) by mouth daily. 30 tablet 30   No current facility-administered medications for this visit.    PHYSICAL EXAMINATION: ECOG PERFORMANCE STATUS: 2 - Symptomatic, <50%  confined to bed  Vitals:   08/05/20 1348  BP: 110/74  Pulse: 97  Resp: 16  Temp: 98.7 F (37.1 C)  SpO2: 100%   Filed Weights   08/05/20 1348  Weight: 130 lb (59 kg)    Due to COVID19 we will limit examination to appearance. Patient had no complaints.  GENERAL:alert, no distress and comfortable SKIN: skin color normal, no rashes or significant lesions EYES: normal, Conjunctiva are pink and non-injected, sclera clear  NEURO: alert & oriented x 3 with fluent speech  LABORATORY DATA:  I have reviewed the data as listed CBC Latest Ref Rng & Units 08/05/2020 07/28/2020 07/22/2020  WBC 4.0 - 10.5 K/uL 4.9 3.9(L) 4.0  Hemoglobin 12.0 -  15.0 g/dL 11.1(L) 10.8(L) 10.9(L)  Hematocrit 36.0 - 46.0 % 32.5(L) 32.1(L) 32.4(L)  Platelets 150 - 400 K/uL 262 293 284     CMP Latest Ref Rng & Units 08/05/2020 07/28/2020 07/22/2020  Glucose 70 - 99 mg/dL 111(H) 84 85  BUN 6 - 20 mg/dL _0 Creatinine 0.44 - 1.00 mg/dL 0.49 0.64 0.67  Sodium 135 - 145 mmol/L 142 141 142  Potassium 3.5 - 5.1 mmol/L 3.6 4.1 3.9  Chloride 98 - 111 mmol/L 105 105 105  CO2 22 - 32 mmol/L _1 Calcium 8.9 - 10.3 mg/dL 9.8 9.8 9.8  Total Protein 6.5 - 8.1 g/dL 7.4 7.4 7.5  Total Bilirubin 0.3 - 1.2 mg/dL 0.5 0.5 0.4  Alkaline Phos 38 - 126 U/L 101 87 94  AST 15 - 41 U/L 195(HH) 144(H) 157(H)  ALT 0 - 44 U/L 352(HH) 329(HH) 239(H)      RADIOGRAPHIC STUDIES: I have personally reviewed the radiological images as listed and agreed with the findings in the report. No results found.   ASSESSMENT & PLAN:  Samiksha Pellicano is a 35 y.o. female with   1. Malignant neoplasm of upper-outer quadrant of right breast, Stage IIA, p(T2N1aM0), ER+/PR+/HER2-, Grade II, Mammaprint luminal type A, low risk -She has a 4.7cm right breast mass at 11:00 position and two abnormal right axillary LN. 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 -Her 11/2019 breast MRI and PET scan did not indicate left breast or distant malignancy or metastasis.  -Due to her large tumor, she needs mastectomy if she proceeded with surgery first.  She has been seen by Dr. Georgette Dover. Her mastectomy will be complicated by history of implant placement. -Mammaprint showed low risk disease, she would unlikely benefit from chemotherapy, and neoadjuvant chemotherapy is not recommended.  -I have started her on neoadjuvant tamoxifen on 12/01/19. She tolerated poorly with nausea, and diffuse body aches. Dose reduced to 66m from 12/09/19. I started her on monthly Zoladex injection on 12/09/19. Then switched Tamoxifen to Anastrozole on 01/06/20. -I started her on Verzenio on 01/12/2020, dose reduced to 524mam and 1004m/11/2020 due to poor tolerance. Held Verzenio on 03/25/20 to proceed with breast surgery. -Given her 03/01/20 US [place] mammogram indicated good response to treatment, she proceeded with right lumpectomy and sentinel LN dissection on 04/15/20 with Dr TsuGeorgette Doverurgical pathology showed larger than expected tumor at 3.1cm of invasive carcinoma with mixed ductal and lobular features and DCIS components. She also had positive margins and 2/3 positive LNs. -She was recommended to proceed with mastectomy and axillary node dissection due to her young age and positive nodes after neoadjuvant treatment. This was performed on 05/18/20 under Dr. TsuGeorgette Doverowing: residual invasive carcinoma and DCIS. She again had a positive margin and another 2/12 positive nodes. She had stage III disease.  -Due to her N2 disease, I recommend adjuvant chemotherapy to reduce her risk of recurrence, followed by postmastectomy radiation to reduce her risk of local recurrence. -Recent CT scan (done for other reasons) was negative for metastatic disease. -She will continue Zoladex injection. She does not plan to have more children.  We discussed chemo-induced infertility. -She had first cycle AC on  06/24/20. She tolerated this poorly, with severe gastric pain, poor appetite, insomnia, etc.  She had a 2 ED visit since first cycle chemo.  [facility] -  We moved to weekly Taxol on 07/08/20. She tolerated it better  -She has developed a worsening transaminitis, bilirubin is normal, chemo held last week -I reviewed her abdominal ultrasound from last week, which was unremarkable -Lab reviewed.  We will continue weekly Taxol  2. Digestive issues, reflux -She is on pantoprazole. Will continue  -She reports issues with digestion today, feeling like the food gets stuck in her stomach. She notes this only occurs after taking the pantoprazole. -She asked about supplements and if they would be okay to take. I reassured her that these would be fine.   3. Generalized Weakness, body pain, Fatigue -Pt notes for the past 3+ years she has had mid chest and mid back pain. Previously intermittent, now constant. She also notes b/l arm and leg weakness and pain along with tingling of her toes and fingers. -This has lead to fatigue, low appetite, decreased daily function/activity. -Pt notes prior workup for this in [place] was negative with Neurologist and cardiologist. They said this is related to stress, anxiety or depression. Pt feels she has an illness causing this instead. -Her 11/25/19 PET was negative for any distant malignancy or metastasis.  -This is improved some since stopping AC treatment.    4. Anxiety, depression -Pt notes feeling more anxious (due to health before breast cancer), than depressed. -She was previously on Lexapro.  I switched her to Effexor, she is tolerating better  -I previously discussed increasing Effexor to twice daily, but she opted against this.   5. 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   -She has discussed with our  genetic consoler    6. Social Support -She is from [place] and lives both there and in [place] with her husband. She has 2 teenage children. The rest of her family is in [place].  -She notes her mother is living with her now. Her mother is able to help her, especially with her kids.   7.  Transaminitis -Developed after chemo Taxol, likely secondary to chemotherapy -Ultrasound of liver last week was unremarkable -She knows to avoid Tylenol, she does not drink alcohol -Continue monitoring closely, will continue Taxol if AST and ALT less than 500, with normal bilirubin, I spoke with pharmacist today, no dose adjustment needed.   PLAN:  -Proceed with C4 of weekly taxol today  -f/u next week    No problem-specific Assessment & Plan notes found for this encounter.   No orders of the defined types were placed in this encounter.  All questions were answered. The patient knows to call the clinic with any problems, questions or concerns. No barriers to learning was detected. The total time spent in the appointment was 30 minutes.     Truitt Merle, MD 08/05/2020

## 2020-08-05 NOTE — Progress Notes (Unsigned)
CRITICAL LAB RESULTS OF AST 195 & ALT 352 CALLED TO, READ BACK BY AND VERIFIED WITH Shelle Iron, RN @ [facility]

## 2020-08-05 NOTE — Patient Instructions (Signed)
[facility]  Discharge Instructions: Thank you for choosing [facility] to provide your oncology and hematology care.   If you have a lab appointment with the [facility], please go directly to the [facility] and check in at the registration area.   Wear comfortable clothing and clothing appropriate for easy access to any Portacath or PICC line.   We strive to give you quality time with your provider. You may need to reschedule your appointment if you arrive late (15 or more minutes).  Arriving late affects you and other patients whose appointments are after yours.  Also, if you miss three or more appointments without notifying the office, you may be dismissed from the clinic at the provider's discretion.      For prescription refill requests, have your pharmacy contact our office and allow 72 hours for refills to be completed.    Today you received the following chemotherapy and/or immunotherapy agents Paclitaxel (Taxol) with Cryotherapy.      To help prevent nausea and vomiting after your treatment, we encourage you to take your nausea medication as directed.  BELOW ARE SYMPTOMS THAT SHOULD BE REPORTED IMMEDIATELY: *FEVER GREATER THAN 100.4 F (38 C) OR HIGHER *CHILLS OR SWEATING *NAUSEA AND VOMITING THAT IS NOT CONTROLLED WITH YOUR NAUSEA MEDICATION *UNUSUAL SHORTNESS OF BREATH *UNUSUAL BRUISING OR BLEEDING *URINARY PROBLEMS (pain or burning when urinating, or frequent urination) *BOWEL PROBLEMS (unusual diarrhea, constipation, pain near the anus) TENDERNESS IN MOUTH AND THROAT WITH OR WITHOUT PRESENCE OF ULCERS (sore throat, sores in mouth, or a toothache) UNUSUAL RASH, SWELLING OR PAIN  UNUSUAL VAGINAL DISCHARGE OR ITCHING   Items with * indicate a potential emergency and should be followed up as soon as possible or go to the Emergency Department if any problems should occur.  Please show the CHEMOTHERAPY ALERT CARD or IMMUNOTHERAPY  ALERT CARD at check-in to the Emergency Department and triage nurse.  Should you have questions after your visit or need to cancel or reschedule your appointment, please contact [facility]  Dept: [phone]  and follow the prompts.  Office hours are 8:00 a.m. to 4:30 p.m. Monday - Friday. Please note that voicemails left after 4:00 p.m. may not be returned until the following business day.  We are closed weekends and major holidays. You have access to a nurse at all times for urgent questions. Please call the main number to the clinic Dept: [phone] and follow the prompts.   For any non-urgent questions, you may also contact your provider using MyChart. We now offer e-Visits for anyone 61 and older to request care online for non-urgent symptoms. For details visit [email or web address].   Also download the MyChart app! Go to the app store, search "MyChart", open the app, select [facility], and log in with your MyChart username and password.  Due to Covid, a mask is required upon entering the hospital/clinic. If you do not have a mask, one will be given to you upon arrival. For doctor visits, patients may have 1 support person aged 25 or older with them. For treatment visits, patients cannot have anyone with them due to current Covid guidelines and our immunocompromised population.

## 2020-08-06 ENCOUNTER — Telehealth: Payer: Self-pay

## 2020-08-12 ENCOUNTER — Inpatient Hospital Stay (HOSPITAL_BASED_OUTPATIENT_CLINIC_OR_DEPARTMENT_OTHER): Payer: 59 | Admitting: Hematology

## 2020-08-12 ENCOUNTER — Inpatient Hospital Stay: Payer: 59

## 2020-08-12 ENCOUNTER — Encounter: Payer: Self-pay | Admitting: *Deleted

## 2020-08-12 ENCOUNTER — Encounter: Payer: Self-pay | Admitting: Hematology

## 2020-08-12 ENCOUNTER — Other Ambulatory Visit: Payer: Self-pay

## 2020-08-12 VITALS — BP 114/78 | HR 85 | Temp 98.3°F | Resp 18 | Ht 64.0 in | Wt 129.8 lb

## 2020-08-12 DIAGNOSIS — Z170 Estrogen receptor positive status [ER+]: Secondary | ICD-10-CM | POA: Diagnosis not present

## 2020-08-12 DIAGNOSIS — C50411 Malignant neoplasm of upper-outer quadrant of right female breast: Secondary | ICD-10-CM

## 2020-08-12 DIAGNOSIS — Z95828 Presence of other vascular implants and grafts: Secondary | ICD-10-CM

## 2020-08-12 DIAGNOSIS — Z5111 Encounter for antineoplastic chemotherapy: Secondary | ICD-10-CM | POA: Diagnosis not present

## 2020-08-12 LAB — CBC WITH DIFFERENTIAL (CANCER CENTER ONLY)
Abs Immature Granulocytes: 0.11 10*3/uL — ABNORMAL HIGH (ref 0.00–0.07)
Basophils Absolute: 0.1 10*3/uL (ref 0.0–0.1)
Basophils Relative: 1 %
Eosinophils Absolute: 0.1 10*3/uL (ref 0.0–0.5)
Eosinophils Relative: 2 %
HCT: 34 % — ABNORMAL LOW (ref 36.0–46.0)
Hemoglobin: 11.4 g/dL — ABNORMAL LOW (ref 12.0–15.0)
Immature Granulocytes: 2 %
Lymphocytes Relative: 44 %
Lymphs Abs: 2.9 10*3/uL (ref 0.7–4.0)
MCH: 31.2 pg (ref 26.0–34.0)
MCHC: 33.5 g/dL (ref 30.0–36.0)
MCV: 93.2 fL (ref 80.0–100.0)
Monocytes Absolute: 0.4 10*3/uL (ref 0.1–1.0)
Monocytes Relative: 6 %
Neutro Abs: 3.1 10*3/uL (ref 1.7–7.7)
Neutrophils Relative %: 45 %
Platelet Count: 332 10*3/uL (ref 150–400)
RBC: 3.65 MIL/uL — ABNORMAL LOW (ref 3.87–5.11)
RDW: 13.6 % (ref 11.5–15.5)
WBC Count: 6.7 10*3/uL (ref 4.0–10.5)
nRBC: 0 % (ref 0.0–0.2)

## 2020-08-12 LAB — CMP (CANCER CENTER ONLY)
ALT: 269 U/L — ABNORMAL HIGH (ref 0–44)
AST: 90 U/L — ABNORMAL HIGH (ref 15–41)
Albumin: 4.4 g/dL (ref 3.5–5.0)
Alkaline Phosphatase: 106 U/L (ref 38–126)
Anion gap: 9 (ref 5–15)
BUN: 11 mg/dL (ref 6–20)
CO2: 26 mmol/L (ref 22–32)
Calcium: 10 mg/dL (ref 8.9–10.3)
Chloride: 104 mmol/L (ref 98–111)
Creatinine: 0.71 mg/dL (ref 0.44–1.00)
GFR, Estimated: >60 mL/min (ref >60)
Glucose, Bld: 103 mg/dL — ABNORMAL HIGH (ref 70–99)
Potassium: 3.9 mmol/L (ref 3.5–5.1)
Sodium: 139 mmol/L (ref 135–145)
Total Bilirubin: 0.5 mg/dL (ref 0.3–1.2)
Total Protein: 7.9 g/dL (ref 6.5–8.1)

## 2020-08-12 MED ORDER — HEPARIN SOD (PORK) LOCK FLUSH 100 UNIT/ML IV SOLN
500.0000 [IU] | Freq: Once | INTRAVENOUS | Status: AC | PRN
  Administered 2020-08-12: 500 [IU]
  Filled 2020-08-12: qty 5

## 2020-08-12 MED ORDER — FAMOTIDINE 20 MG IN NS 100 ML IVPB
INTRAVENOUS | Status: AC
  Filled 2020-08-12: qty 100

## 2020-08-12 MED ORDER — DIPHENHYDRAMINE HCL 50 MG/ML IJ SOLN
25.0000 mg | Freq: Once | INTRAMUSCULAR | Status: AC
  Administered 2020-08-12: 25 mg via INTRAVENOUS

## 2020-08-12 MED ORDER — SODIUM CHLORIDE 0.9 % IV SOLN
10.0000 mg | Freq: Once | INTRAVENOUS | Status: AC
  Administered 2020-08-12: 10 mg via INTRAVENOUS
  Filled 2020-08-12: qty 10
  Filled 2020-08-12: qty 1

## 2020-08-12 MED ORDER — DIPHENHYDRAMINE HCL 25 MG PO CAPS
ORAL_CAPSULE | ORAL | Status: AC
  Filled 2020-08-12: qty 1

## 2020-08-12 MED ORDER — SODIUM CHLORIDE 0.9 % IV SOLN
Freq: Once | INTRAVENOUS | Status: AC
  Filled 2020-08-12: qty 250

## 2020-08-12 MED ORDER — SODIUM CHLORIDE 0.9% FLUSH
10.0000 mL | INTRAVENOUS | Status: AC | PRN
  Administered 2020-08-12: 10 mL
  Filled 2020-08-12: qty 10

## 2020-08-12 MED ORDER — SODIUM CHLORIDE 0.9% FLUSH
10.0000 mL | INTRAVENOUS | Status: DC | PRN
Start: 2020-08-12 — End: 2020-08-12
  Administered 2020-08-12: 10 mL
  Filled 2020-08-12: qty 10

## 2020-08-12 MED ORDER — SODIUM CHLORIDE 0.9 % IV SOLN
80.0000 mg/m2 | Freq: Once | INTRAVENOUS | Status: AC
  Administered 2020-08-12: 132 mg via INTRAVENOUS
  Filled 2020-08-12: qty 22

## 2020-08-12 MED ORDER — FAMOTIDINE 20 MG IN NS 100 ML IVPB
20.0000 mg | Freq: Once | INTRAVENOUS | Status: AC
  Administered 2020-08-12: 20 mg via INTRAVENOUS

## 2020-08-12 MED ORDER — PANTOPRAZOLE SODIUM 40 MG PO TBEC: 40.0000 mg | DELAYED_RELEASE_TABLET | Freq: Every day | ORAL | 3 refills | Status: DC

## 2020-08-12 MED ORDER — DIPHENHYDRAMINE HCL 50 MG/ML IJ SOLN
INTRAMUSCULAR | Status: AC
  Filled 2020-08-12: qty 1

## 2020-08-12 NOTE — Progress Notes (Signed)
[place]   [phone] Fax:([phone]) [phone]   Clinic Follow up Note   Patient Care Team: [name], Lilia Argue, MD as PCP - General (Family Medicine) Mansouraty, Telford Nab., MD as Consulting Physician (Gastroenterology) Mauro Kaufmann, RN as Oncology Nurse Navigator Rockwell Germany, RN as Oncology Nurse Navigator Donnie Mesa, MD as Consulting Physician (General Surgery) Truitt Merle, MD as Consulting Physician (Hematology) Kyung Rudd, MD as Consulting Physician (Radiation Oncology) Contogiannis, Audrea Muscat, MD as Consulting Physician (Plastic Surgery)  Date of Service:  08/12/2020  CHIEF COMPLAINT: f/u of right breast cancer  SUMMARY OF ONCOLOGIC HISTORY: Oncology History Overview Note  Cancer Staging Malignant neoplasm of upper-outer quadrant of right breast in female, estrogen receptor positive ([place]) Staging form: Breast, AJCC 8th Edition - Clinical stage from 11/12/2019: Stage IIA (cT2, cN1, cM0, G2, ER+, PR+, HER2-) - Signed by Truitt Merle, MD on 11/19/2019 Stage prefix: Initial diagnosis Histologic grading system: 3 grade system - Pathologic stage from 04/15/2020: No Stage Recommended (ypT2, pN2a, cM0, G2, ER+, PR+, HER2-) - Signed by Truitt Merle, MD on 05/27/2020 Stage prefix: Post-therapy Histologic grading system: 3 grade system Residual tumor (R): R1 - Microscopic    Malignant neoplasm of upper-outer quadrant of right breast in female, estrogen receptor positive ([place])  11/05/2019 Mammogram   IMPRESSION: Large irregular palpable mass 4.7 x 1.4 x 1.4 cm within the upper-outer right breast 11 o'clock position, 4 cm from nipple.  There is a large area of associated coarse heterogeneous calcifications within the mass. Overall findings are concerning for breast carcinoma.   Two cortically thickened right axillary lymph nodes which are indeterminate in etiology.   11/12/2019 Cancer Staging   Staging form: Breast, AJCC 8th Edition - Clinical  stage from 11/12/2019: Stage IIA (cT2, cN1, cM0, G2, ER+, PR+, HER2-) - Signed by Truitt Merle, MD on 11/19/2019    11/12/2019 Initial Biopsy   Diagnosis 1. Breast, right, needle core biopsy, right - INVASIVE MAMMARY CARCINOMA, SEE COMMENT. - MAMMARY CARCINOMA IN SITU. 2. Lymph node, needle/core biopsy, right - METASTATIC MAMMARY CARCINOMA. Microscopic Comment 1. The carcinoma appears grade 2 and measures 16 mm in greatest linear extent. E-cadherin will be ordered. Prognostic makers will be ordered. Dr. Saralyn Pilar has reviewed the case. The case was called to [facility] on 01/13/2020.    1. E-cadherin is strongly positive consistent with a ductal phenotype.   11/12/2019 Receptors her2   1. PROGNOSTIC INDICATORS Results: IMMUNOHISTOCHEMICAL AND MORPHOMETRIC ANALYSIS PERFORMED MANUALLY The tumor cells are NEGATIVE for Her2 (0). Estrogen Receptor: 100%, POSITIVE, STRONG STAINING INTENSITY Progesterone Receptor: 100%, POSITIVE, STRONG STAINING INTENSITY Proliferation Marker Ki67: 5%   11/17/2019 Initial Diagnosis   Malignant neoplasm of upper-outer quadrant of right breast in female, estrogen receptor positive ([place])    11/25/2019 Breast MRI   IMPRESSION: 1. Large area of non-mass enhancement involving the UPPER OUTER QUADRANT of the RIGHT breast measuring approximately 4.2 x 4.1 x 2.4 cm. The biopsy-proven IDC and DCIS is present at the superomedial aspect of this NME. 2. No MRI evidence of malignancy involving the LEFT breast. 3. Intact BILATERAL retropectoral implants. 4. 2 pathologically enlarged RIGHT axillary lymph nodes. One of these nodes is biopsy-proven metastatic disease. No pathologic lymphadenopathy elsewhere   11/25/2019 PET scan   IMPRESSION: 1. Two mildly enlarged hypermetabolic right axillary lymph nodes compatible with right axillary nodal metastases. 2. No additional sites of hypermetabolic metastatic disease. 3. Asymmetric indistinct upper  right breast hypermetabolism, without discrete mass  correlate on the CT images, compatible with known primary right breast malignancy.     11/26/2019 Genetic Testing   Negative genetic testing: no pathogenic variants detected in [facility] STAT Breast Cancer Panel.  Variant of uncertain significance (VUS) detected in CHEK2 at c.1556G>T (p.Arg519Leu).  The report date is November 26, 2019.   The STAT Breast cancer panel offered by [facility] includes sequencing and rearrangement analysis for the following 9 genes:  ATM, BRCA1, BRCA2, CDH1, CHEK2, PALB2, PTEN, STK11 and TP53.    Results of [facility] Multi-Cancer Panel are pending.    12/01/2019 -  Anti-estrogen oral therapy   Neoadjuvant Tamoxifen 29m on 12/01/19. Reduced to 124mon 12/09/19.       ---Zoladex injection monthly starting 12/09/19.       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  Variants of uncertain significance detected RAD51D at c.715C>T (p.Arg239Trp) and CHEK2 at c.1556G>T (p.Arg519Leu).  The report date is December 08, 2019.   The Multi-Cancer Panel offered by [facility] includes sequencing and/or deletion duplication testing of the following 85 genes: AIP, ALK, APC, ATM, AXIN2,BAP1,  BARD1, BLM, BMPR1A, BRCA1, BRCA2, BRIP1, CASR, CDC73, CDH1, CDK4, CDKN1B, CDKN1C, CDKN2A (p14ARF), CDKN2A (p16INK4a), CEBPA, CHEK2, CTNNA1, DICER1, DIS3L2, EGFR (c.2369C>T, p.Thr790Met variant only), EPCAM (Deletion/duplication testing only), FH, FLCN, GATA2, GPC3, GREM1 (Promoter region deletion/duplication testing only), HOXB13 (c.251G>A, p.Gly84Glu), HRAS, KIT, MAX, MEN1, MET, MITF (c.952G>A, p.Glu318Lys variant only), MLH1, MSH2, MSH3, MSH6, MUTYH, NBN, NF1, NF2, NTHL1, PALB2, PDGFRA, PHOX2B, PMS2, POLD1, POLE, POT1, PRKAR1A, PTCH1, PTEN, RAD50, RAD51C, RAD51D, RB1, RECQL4, RET, RNF43, RUNX1,  SDHAF2, SDHA (sequence changes only), SDHB, SDHC, SDHD, SMAD4, SMARCA4, SMARCB1, SMARCE1, STK11, SUFU, TERC, TERT, TMEM127, TP53, TSC1, TSC2, VHL, WRN and WT1.    12/09/2019 Miscellaneous   Mammaprint luminal type A, low risk  10% risk of recurrence in 10 years with 97.8% benefor of hormaonal therapy alone.    01/12/2020 - 03/25/2020 Chemotherapy   Verzenio started on 01/12/2020, dose reduced to 5011mm and 100m14m11/2022 due to poor tolerance. Held Verzenio on 03/25/20 to proceed with breast surgery.    03/01/2020 Mammogram   Targeted ultrasound is performed, showing interval decreasing conspicuity of the patient's known right breast cancer. A post biopsy clip with an associated irregular area shadowing is demonstrated at the 11 o'clock position 4 cm from the nipple. Exact measurements are difficult due to the vague appearance of the findings. It measures approximately 1.2 x 0.9 x 0.6 cm (previously 4.7 x 2.2 x 1.5 cm).   IMPRESSION: Imaging findings consistent with good response to chemotherapy.   03/16/2020 Imaging   MRI Breast  IMPRESSION: 1. Interval resolution of RIGHT axillary adenopathy. 2. Slightly smaller area of non mass enhancement in the UPPER-OUTER QUADRANT of the RIGHT breast.   04/15/2020 Surgery   RIGHT BREAST LUMPECTOMY WITH RADIOACTIVE SEED LOCALIZATION and RADIOACTIVE SEED GUIDED RIGHT AXILLARY SENTINEL LYMPH NODE DISSECTION and SENTINEL NODE BIOPSY by Dr TsueGeorgette Dover3/31/2022 Pathology Results   FINAL MICROSCOPIC DIAGNOSIS:   A. 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  - Invasive carcinoma broadly involves each the anterior, posterior,  superior and inferior margins.  - DCIS involves the inferior margin and is < 1 mm from each  the anterior  and superior margins.  - Atypical lobular  hyperplasia.  - Biopsy sites.  - See oncology table.   B. TARGETED LYMPH NODE, RIGHT AXILLA, BIOPSY:  - Metastatic carcinoma in (1) of (1) lymph node.  - Biopsy site.   C. SENTINEL LYMPH NODE, RIGHT AXILLA #1, BIOPSY:  - Metastatic carcinoma in (1) of (1) lymph node.   D. SENTINEL LYMPH NODE, RIGHT AXILLA #2, BIOPSY:  - One lymph node, negative for carcinoma (0/1).    04/15/2020 Cancer Staging   Staging form: Breast, AJCC 8th Edition - Pathologic stage from 04/15/2020: No Stage Recommended (ypT2, pN2a, cM0, G2, ER+, PR+, HER2-) - Signed by Truitt Merle, MD on 05/27/2020  Stage prefix: Post-therapy  Histologic grading system: 3 grade system  Residual tumor (R): R1 - Microscopic    05/18/2020 Surgery   Right Mastectomy  A. BREAST, RIGHT, MASTECTOMY:  - Residual invasive carcinoma with mixed ductal and lobular features.       Residual invasive carcinoma broadly involves the anterior inferior  soft tissue margin.       Residual invasive carcinoma is focally < 1 mm from the deep margin.  - Residual ductal carcinoma in situ.       Residual DCIS is 3 mm from the closest margin, deep.  - Residual atypical lobular hyperplasia.  - Prior procedure site changes.  - Biopsy clip (X2).   B. BREAST IMPLANT, RIGHT, EXPLANTATION:  - Implant (gross only).   C. LYMPH NODES, RIGHT AXILLA, DISSECTION:  - Metastatic carcinoma in (2) of (12) lymph nodes.  - Prior procedure site changes in surrounding soft tissue.    06/24/2020 -  Chemotherapy    Patient is on Treatment Plan: BREAST ADJUVANT DOSE DENSE AC Q14D / PACLITAXEL Q7D          CURRENT THERAPY:  Adjuvant Taxol, q7d, starting 07/08/20  INTERVAL HISTORY:  Caitlyn Miller is here for a follow up of breast cancer. She was last seen by me on 08/05/20. She presents to the clinic alone. She reports bone pain following her last treatment. She notes she is afraid to take medication because of her liver function.  She reports her hot flashes are  increasing. She reports she has had issues with the effexor since starting taxol. She notes she wakes up frequently at night.  All other systems were reviewed with the patient and are negative.  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   Pelvic pain 09/2008    SURGICAL HISTORY: Past Surgical History:  Procedure Laterality Date   AUGMENTATION MAMMAPLASTY Bilateral 3419   silicone    BREAST IMPLANT REMOVAL Right 05/18/2020   Procedure: REMOVAL OF RIGHT BREAST IMPLANT;  Surgeon: Donnie Mesa, MD;  Location: [place];  Service: General;  Laterality: Right;   BREAST LUMPECTOMY  WITH RADIOACTIVE SEED LOCALIZATION Right 04/15/2020   Procedure: RIGHT BREAST LUMPECTOMY WITH RADIOACTIVE SEED LOCALIZATION;  Surgeon: Donnie Mesa, MD;  Location: [place];  Service: General;  Laterality: Right;   MODIFIED MASTECTOMY Right 05/18/2020   Procedure: RIGHT MODIFIED RADICAL MASTECTOMY;  Surgeon: Donnie Mesa, MD;  Location: [place];  Service: General;  Laterality: Right;   PORTACATH PLACEMENT Left 06/10/2020   Procedure: INSERTION PORT-A-CATH;  Surgeon: Donnie Mesa, MD;  Location: [place];  Service: General;  Laterality: Left;  45 MINUTES ROOM 2   RADIOACTIVE SEED GUIDED AXILLARY SENTINEL LYMPH NODE Right 04/15/2020   Procedure: RADIOACTIVE SEED GUIDED RIGHT AXILLARY SENTINEL LYMPH NODE DISSECTION;  Surgeon: Donnie Mesa, MD;   Location: [place];  Service: General;  Laterality: Right;   SENTINEL NODE BIOPSY N/A 04/15/2020   Procedure: SENTINEL NODE BIOPSY;  Surgeon: Donnie Mesa, MD;  Location: [place];  Service: General;  Laterality: N/A;   WISDOM TOOTH EXTRACTION      I have reviewed the social history and family history with the patient and they are unchanged from previous note.  ALLERGIES:  is allergic to ibuprofen.  MEDICATIONS:  Current Outpatient Medications  Medication Sig Dispense Refill   acetaminophen (TYLENOL) 500 MG tablet Take 500 mg by mouth every 6 (six) hours as needed for moderate pain.     clindamycin (CLINDAGEL) 1 % gel Apply topically 2 (two) times daily. 30 g 0   gabapentin (NEURONTIN) 100 MG capsule TAKE 2 CAPSULES(200 MG) BY MOUTH AT BEDTIME 60 capsule 1   lidocaine (LIDODERM) 5 % Place 1 patch onto the skin daily. Remove & Discard patch within 12 hours or as directed by MD 30 patch 0   lidocaine-prilocaine (EMLA) cream Apply to affected area once (Patient taking differently: Apply 1 application topically daily as needed (port access).) 30 g 3   magnesium oxide (MAG-OX) 400 (240 Mg) MG tablet Take 1 tablet (400 mg total) by mouth daily. (Patient not taking: Reported on 07/22/2020) 20 tablet 0   ondansetron (ZOFRAN) 8 MG tablet Take 1 tablet (8 mg total) by mouth every 8 (eight) hours as needed. Start on the third day after chemotherapy. 30 tablet 2   pantoprazole (PROTONIX) 40 MG tablet Take 1 tablet (40 mg total) by mouth daily. 30 tablet 3   prochlorperazine (COMPAZINE) 10 MG tablet TAKE 1 TABLET(10 MG) BY MOUTH EVERY 6 HOURS AS NEEDED FOR NAUSEA OR VOMITING 30 tablet 3   traMADol (ULTRAM) 50 MG tablet Take 1 tablet (50 mg total) by mouth every 6 (six) hours as needed (mild pain). (Patient not taking: Reported on 07/22/2020) 30 tablet 0   venlafaxine (EFFEXOR) 37.5 MG tablet Take 1 tablet (37.5 mg total) by mouth daily. 30 tablet 30   No current  facility-administered medications for this visit.   Facility-Administered Medications Ordered in Other Visits  Medication Dose Route Frequency Provider Last Rate Last Admin   sodium chloride flush (NS) 0.9 % injection 10 mL  10 mL Intracatheter PRN Truitt Merle, MD   10 mL at 08/12/20 1642    PHYSICAL EXAMINATION: ECOG PERFORMANCE STATUS: 2 - Symptomatic, <50% confined to bed  Vitals:   08/12/20 1311  BP: 114/78  Pulse: 85  Resp: 18  Temp: 98.3 F (36.8 C)  SpO2: 100%   Filed Weights   08/12/20 1311  Weight: 129 lb 12.8 oz (58.9 kg)    GENERAL:alert, no distress and comfortable SKIN: skin color normal, no rashes or  significant lesions EYES: normal, Conjunctiva are pink and non-injected, sclera clear  NEURO: alert & oriented x 3 with fluent speech  LABORATORY DATA:  I have reviewed the data as listed CBC Latest Ref Rng & Units 08/12/2020 08/05/2020 07/28/2020  WBC 4.0 - 10.5 K/uL 6.7 4.9 3.9(L)  Hemoglobin 12.0 - 15.0 g/dL 11.4(L) 11.1(L) 10.8(L)  Hematocrit 36.0 - 46.0 % 34.0(L) 32.5(L) 32.1(L)  Platelets 150 - 400 K/uL 332 262 293     CMP Latest Ref Rng & Units 08/12/2020 08/05/2020 07/28/2020  Glucose 70 - 99 mg/dL 103(H) 111(H) 84  BUN 6 - 20 mg/dL _0 Creatinine 0.44 - 1.00 mg/dL 0.71 0.49 0.64  Sodium 135 - 145 mmol/L 139 142 141  Potassium 3.5 - 5.1 mmol/L 3.9 3.6 4.1  Chloride 98 - 111 mmol/L 104 105 105  CO2 22 - 32 mmol/L _1 Calcium 8.9 - 10.3 mg/dL 10.0 9.8 9.8  Total Protein 6.5 - 8.1 g/dL 7.9 7.4 7.4  Total Bilirubin 0.3 - 1.2 mg/dL 0.5 0.5 0.5  Alkaline Phos 38 - 126 U/L 106 101 87  AST 15 - 41 U/L 90(H) 195(HH) 144(H)  ALT 0 - 44 U/L 269(H) 352(HH) 329(HH)      RADIOGRAPHIC STUDIES: I have personally reviewed the radiological images as listed and agreed with the findings in the report. No results found.   ASSESSMENT & PLAN:  Lubertha Leite is a 35 y.o. female with   1. Malignant neoplasm of upper-outer quadrant of right breast, Stage  IIA, p(T2N1aM0), ER+/PR+/HER2-, Grade II, Mammaprint luminal type A, low risk -She has a 4.7cm right breast mass at 11:00 position and two abnormal right axillary LN. 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 -Her 11/2019 breast MRI and PET scan did not indicate left breast or distant malignancy or metastasis.  -Due to her large tumor, she needs mastectomy if she proceeded with surgery first.  She has been seen by Dr. Georgette Dover. Her mastectomy will be complicated by history of implant placement. -Mammaprint showed low risk disease and neoadjuvant chemotherapy is not recommended.  -I have started her on neoadjuvant tamoxifen on 12/01/19. She tolerated poorly with nausea, and diffuse body aches. Dose reduced to 35m from 12/09/19. I started her on monthly Zoladex injection on 12/09/19. Then switched Tamoxifen to Anastrozole on 01/06/20. -I started her on Verzenio on 01/12/2020, dose reduced to 545mam and 10024m/11/2020 due to poor tolerance. Held Verzenio on 03/25/20 to proceed with breast surgery. -Given her 03/01/20 [place] mammogram indicated good response to treatment, she proceeded with right lumpectomy and sentinel LN dissection on 04/15/20 with Dr TsuGeorgette Doverurgical pathology showed larger than expected tumor at 3.1cm of invasive carcinoma with mixed ductal and lobular features and DCIS components. She also had positive margins and 2/3 positive LNs. -She underwent mastectomy and axillary node dissection on 05/18/20 under Dr. TsuGeorgette Doverowing: residual invasive carcinoma and DCIS. She again had a positive margin and another 2/12 positive nodes. She had stage III disease.  -Due to her N2 disease, I recommend adjuvant chemotherapy to reduce her risk of recurrence, followed by postmastectomy radiation to reduce her risk of local recurrence. -Recent CT scan (done for other reasons) was negative for metastatic disease. -She  will continue Zoladex injection. She does not plan to have more children.  We discussed chemo-induced infertility. -She had first cycle AC on 06/24/20. She tolerated this poorly,  with severe gastric pain, poor appetite, insomnia, etc.  She had a 2 ED visit since first cycle chemo.  Patient declined more chemo with [facility] after a lengthy discussion. -We moved to weekly Taxol on 07/08/20. She tolerated it better  -Lab reviewed, liver function improving.  We will continue weekly Taxol  2. 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 I recommend she use ibuprofen/Motrin. 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  Transaminitis -Developed after chemo Taxol, likely secondary to chemotherapy -Ultrasound of liver 07/29/20 was unremarkable -She knows to avoid Tylenol, she does not drink alcohol -Her liver functions today are improved (AST 90, ALT 269). She notes she has not taken tylenol. -Continue monitoring closely, will continue Taxol if AST and ALT less than 500, with normal bilirubin  4. Digestive issues, reflux -She is on pantoprazole. Will continue  -She again asked about supplements and if they would be okay to take. I will consult with our pharmacist and let her know.   5. Generalized Weakness, body pain, Fatigue -Pt notes for the past 3+ years she has had mid chest and mid back pain. Previously intermittent, now constant. She also notes b/l arm and leg weakness and pain along with tingling of her toes and fingers. -This has lead to fatigue, low appetite, decreased daily function/activity. -Pt notes prior workup for this in [place] was negative with Neurologist and cardiologist. They said this is related to stress, anxiety or depression. Pt feels she has an illness causing this instead. -Her 11/25/19 PET was negative for any distant malignancy or metastasis.  -This has returned to  baseline since stopping [facility] treatment.    6. Anxiety, depression -Pt notes feeling more anxious (due to health before breast cancer), than depressed. -She was previously on Lexapro.  I switched her to Effexor, she is tolerating better  -I previously discussed increasing Effexor to twice daily, but she opted against this. She reports this affects her differently since being on Taxol.   7. 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   -She has discussed with our genetic consoler    8. Social Support -She is from [place] and lives both there and in [place] with her husband. She has 2 teenage children. The rest of her family is in [place].  -She notes her mother is living with her now. Her mother is able to help her, especially with her kids.    PLAN:  -I refilled her pantoprazole  -Proceed with C5 of weekly taxol today  -f/u next week  -Continue Zoladex injection every 4 weeks, due next week   No problem-specific Assessment & Plan notes found for this encounter.   No orders of the defined types were placed in this encounter.  All questions were answered. The patient knows to call the clinic with any problems, questions or concerns. No barriers to learning was detected.      Truitt Merle, MD 08/12/2020    I, Wilburn Mylar, am acting as scribe for Truitt Merle, MD.   I have reviewed the above documentation for accuracy and completeness, and I agree with the above.

## 2020-08-18 ENCOUNTER — Other Ambulatory Visit: Payer: Self-pay

## 2020-08-18 ENCOUNTER — Encounter: Payer: Self-pay | Admitting: Physical Therapy

## 2020-08-18 ENCOUNTER — Ambulatory Visit: Payer: 59 | Attending: Surgery | Admitting: Physical Therapy

## 2020-08-18 DIAGNOSIS — Z483 Aftercare following surgery for neoplasm: Secondary | ICD-10-CM | POA: Insufficient documentation

## 2020-08-18 DIAGNOSIS — R293 Abnormal posture: Secondary | ICD-10-CM | POA: Insufficient documentation

## 2020-08-18 DIAGNOSIS — C50411 Malignant neoplasm of upper-outer quadrant of right female breast: Secondary | ICD-10-CM | POA: Diagnosis present

## 2020-08-18 DIAGNOSIS — Z170 Estrogen receptor positive status [ER+]: Secondary | ICD-10-CM | POA: Insufficient documentation

## 2020-08-18 DIAGNOSIS — M25611 Stiffness of right shoulder, not elsewhere classified: Secondary | ICD-10-CM | POA: Insufficient documentation

## 2020-08-18 NOTE — Therapy (Signed)
[place], [place], [address] Phone: [phone]   Fax:  [phone]  Physical Therapy Treatment  Patient Details  Name: Caitlyn [facility] MRN: [id number] Date of Birth: 11-16-1985 Referring Provider (PT): Dr. Rodman Key Tsuei/Feng   Encounter Date: 08/18/2020   PT End of Session - 08/18/20 1053     Visit Number 5    Number of Visits 7    Date for PT Re-Evaluation 08/26/20    PT Start Time 1003    PT Stop Time 1050    PT Time Calculation (min) 47 min    Activity Tolerance Patient tolerated treatment well    Behavior During Therapy [facility] for tasks assessed/performed             Past Medical History:  Diagnosis Date   Abnormal Pap smear    Acid reflux    Anemia    Anxiety    Breast cancer ([place])    Decreased appetite 10/08   Depression    Endometriosis 10/2004   Epigastric pain 10/2006   Family history of breast cancer 11/19/2019   FH: migraines    GBS carrier    H/O amenorrhea 06/2006   H/O dyspareunia 7/07   H/O fatigue    H/O nausea and vomiting 10/2006   H/O rubella    H/O varicella    Hyperemesis arising during pregnancy    First pregnancy   Irregular periods/menstrual cycles 06/2692   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   Pelvic pain 09/2008    Past Surgical History:  Procedure Laterality Date   AUGMENTATION MAMMAPLASTY Bilateral 0350   silicone    BREAST IMPLANT REMOVAL Right 05/18/2020   Procedure: REMOVAL OF RIGHT BREAST IMPLANT;  Surgeon: Donnie Mesa, MD;  Location: [place];  Service: General;  Laterality: Right;   BREAST LUMPECTOMY WITH RADIOACTIVE SEED LOCALIZATION Right 04/15/2020   Procedure: RIGHT BREAST LUMPECTOMY WITH RADIOACTIVE SEED LOCALIZATION;  Surgeon: Donnie Mesa, MD;  Location: [place];  Service: General;  Laterality: Right;   MODIFIED MASTECTOMY Right 05/18/2020   Procedure: RIGHT MODIFIED RADICAL MASTECTOMY;  Surgeon: Donnie Mesa, MD;   Location: [place];  Service: General;  Laterality: Right;   PORTACATH PLACEMENT Left 06/10/2020   Procedure: INSERTION PORT-A-CATH;  Surgeon: Donnie Mesa, MD;  Location: [facility];  Service: General;  Laterality: Left;  45 MINUTES ROOM 2   RADIOACTIVE SEED GUIDED AXILLARY SENTINEL LYMPH NODE Right 04/15/2020   Procedure: RADIOACTIVE SEED GUIDED RIGHT AXILLARY SENTINEL LYMPH NODE DISSECTION;  Surgeon: Donnie Mesa, MD;  Location: [place];  Service: General;  Laterality: Right;   SENTINEL NODE BIOPSY N/A 04/15/2020   Procedure: SENTINEL NODE BIOPSY;  Surgeon: Donnie Mesa, MD;  Location: [place];  Service: General;  Laterality: N/A;   WISDOM TOOTH EXTRACTION      There were no vitals filed for this visit.   Subjective Assessment - 08/18/20 1005     Subjective They changed my chemo. My shoulder is doing ok but I have a new pain on my side.    Pertinent History Patient was diagnosed on 11/05/2019 with right grade II invasive ductal carcinoma breast cancer. It measures 4.7 cm and is located in the upper outer quadrant. It is ER/PR positive and HER2 negative with a Ki67 of 5%. She has a biopsied positive axillary lymph node. She had a Right lumpectomy with SLNB (2/3+) performed on 04/15/2020,  right modified mastectomy ALND (2/12), currently doing chemo  and will need radiation    Patient Stated Goals Get back to normal use of right UE    Currently in Pain? Yes    Pain Score 6     Pain Location --   trunk   Pain Orientation Right;Upper    Pain Descriptors / Indicators Sharp;Stabbing    Pain Type Acute pain    Pain Onset In the past 7 days    Pain Frequency Constant    Aggravating Factors  laying on it    Pain Relieving Factors ice pack    Effect of Pain on Daily Activities no effect                [facility] PT Assessment - 08/18/20 0001       AROM   Right Shoulder Flexion 165 Degrees    Right Shoulder ABduction 170 Degrees                            [facility] Adult PT Treatment/Exercise - 08/18/20 0001       Manual Therapy   Soft tissue mobilization with cocoa butter in left sidelying to R lateral trunk including serratus, lats, rhomboids, paraspinals and teres with numerous areas of tightness noted that improved with soft tissue mobilization                         PT Long Term Goals - 07/15/20 1211       PT LONG TERM GOAL #1   Title Patient will demonstrate she has regained near  full shoulder ROM and function post operatively compared to baselines.    Baseline End ROM improved but still limited by cording- 05/13/20; 07/15/20- pt limited at end range by tightness though ROM has improved    Time 6    Period Weeks    Status On-going    Target Date 08/26/20      PT LONG TERM GOAL #2   Title Pt will have decreased pain by atleast 50%    Baseline Pt reports no pain at this time as motion improves-05/13/20    Status Achieved      PT LONG TERM GOAL #3   Title Pt will have quick dash no greater than 20% for return to function prior to next surgery    Baseline 70%; pt did not retake this today but reports no longer feeling limited with reaching and ADLs-05/13/20    Status Partially Met      PT LONG TERM GOAL #4   Title pt will be able to dress, bathe and perform home activities with improved ease    Status Achieved      PT LONG TERM GOAL #5   Title Pt will be independent in a home exercise program for continued strengthening and stretching so she can avoid increased tightness with radiation    Time 6    Period Weeks    Status New    Target Date 08/26/20                   Plan - 08/18/20 1053     Clinical Impression Statement Pt reports she has been having increased pain in her right trunk especially just inferior to R axilla. Spent session focusing on soft tissue mobilization to R serratus, lats, rhomboids, teres and paraspinals with numerous areas of tightness noted that  did improve during session today. Educated pt that she may be sore  for a day or so following soft tissue mobilization. Pt felt relief at end of session. Issued signed script for pt to obtain a prophylactic sleeve and gauntlet prior to her travels in November.    PT Frequency Biweekly    PT Duration 6 weeks    PT Treatment/Interventions ADLs/Self Care Home Management;Therapeutic exercise;Patient/family education;Manual techniques;Manual lymph drainage;Scar mobilization;Passive range of motion;Orthotic Fit/Training    PT Next Visit Plan see how R serratus feels after last session,and instructing in an HEP for R shoulder ROM and strength, did [facility] take her insurance?, assist with obtaining prophylactic compression sleeve, can place on hold once pt regains full ROM and plan to see pt again either halfway through radiation or at completion of radiation    PT Home Exercise Plan Post op shoulder ROM HEP (supine for flex and stargazer), supine dowel flex and abd    Consulted and Agree with Plan of Care Patient             Patient will benefit from skilled therapeutic intervention in order to improve the following deficits and impairments:  Postural dysfunction, Decreased range of motion, Impaired UE functional use, Pain, Decreased knowledge of precautions, Decreased activity tolerance, Impaired sensation, Decreased strength, Increased fascial restricitons  Visit Diagnosis: Stiffness of right shoulder, not elsewhere classified  Aftercare following surgery for neoplasm  Abnormal posture  Malignant neoplasm of upper-outer quadrant of right breast in female, estrogen receptor positive ([place])     Problem List Patient Active Problem List   Diagnosis Date Noted   Menopausal syndrome (hot flushes) 00/37/0488   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

## 2020-08-19 ENCOUNTER — Inpatient Hospital Stay: Payer: 59

## 2020-08-19 ENCOUNTER — Inpatient Hospital Stay (HOSPITAL_BASED_OUTPATIENT_CLINIC_OR_DEPARTMENT_OTHER): Payer: 59 | Admitting: Hematology

## 2020-08-19 ENCOUNTER — Encounter: Payer: Self-pay | Admitting: Hematology

## 2020-08-19 ENCOUNTER — Inpatient Hospital Stay: Payer: 59 | Attending: Hematology

## 2020-08-19 VITALS — BP 105/75 | HR 98 | Temp 98.8°F | Resp 19 | Ht 64.0 in | Wt 129.4 lb

## 2020-08-19 DIAGNOSIS — Z5111 Encounter for antineoplastic chemotherapy: Secondary | ICD-10-CM | POA: Insufficient documentation

## 2020-08-19 DIAGNOSIS — G620 Drug-induced polyneuropathy: Secondary | ICD-10-CM | POA: Diagnosis not present

## 2020-08-19 DIAGNOSIS — Z170 Estrogen receptor positive status [ER+]: Secondary | ICD-10-CM

## 2020-08-19 DIAGNOSIS — C50411 Malignant neoplasm of upper-outer quadrant of right female breast: Secondary | ICD-10-CM | POA: Insufficient documentation

## 2020-08-19 DIAGNOSIS — R7401 Elevation of levels of liver transaminase levels: Secondary | ICD-10-CM | POA: Diagnosis not present

## 2020-08-19 DIAGNOSIS — Z95828 Presence of other vascular implants and grafts: Secondary | ICD-10-CM

## 2020-08-19 DIAGNOSIS — C773 Secondary and unspecified malignant neoplasm of axilla and upper limb lymph nodes: Secondary | ICD-10-CM | POA: Diagnosis not present

## 2020-08-19 LAB — CMP (CANCER CENTER ONLY)
ALT: 117 U/L — ABNORMAL HIGH (ref 0–44)
AST: 41 U/L (ref 15–41)
Albumin: 4.1 g/dL (ref 3.5–5.0)
Alkaline Phosphatase: 87 U/L (ref 38–126)
Anion gap: 10 (ref 5–15)
BUN: 11 mg/dL (ref 6–20)
CO2: 26 mmol/L (ref 22–32)
Calcium: 9.7 mg/dL (ref 8.9–10.3)
Chloride: 105 mmol/L (ref 98–111)
Creatinine: 0.71 mg/dL (ref 0.44–1.00)
GFR, Estimated: >60 mL/min (ref >60)
Glucose, Bld: 157 mg/dL — ABNORMAL HIGH (ref 70–99)
Potassium: 3.5 mmol/L (ref 3.5–5.1)
Sodium: 141 mmol/L (ref 135–145)
Total Bilirubin: 0.5 mg/dL (ref 0.3–1.2)
Total Protein: 7.2 g/dL (ref 6.5–8.1)

## 2020-08-19 LAB — CBC WITH DIFFERENTIAL (CANCER CENTER ONLY)
Abs Immature Granulocytes: 0.03 10*3/uL (ref 0.00–0.07)
Basophils Absolute: 0 10*3/uL (ref 0.0–0.1)
Basophils Relative: 1 %
Eosinophils Absolute: 0.1 10*3/uL (ref 0.0–0.5)
Eosinophils Relative: 2 %
HCT: 31.8 % — ABNORMAL LOW (ref 36.0–46.0)
Hemoglobin: 10.8 g/dL — ABNORMAL LOW (ref 12.0–15.0)
Immature Granulocytes: 1 %
Lymphocytes Relative: 26 %
Lymphs Abs: 1.1 10*3/uL (ref 0.7–4.0)
MCH: 31.3 pg (ref 26.0–34.0)
MCHC: 34 g/dL (ref 30.0–36.0)
MCV: 92.2 fL (ref 80.0–100.0)
Monocytes Absolute: 0.2 10*3/uL (ref 0.1–1.0)
Monocytes Relative: 5 %
Neutro Abs: 2.7 10*3/uL (ref 1.7–7.7)
Neutrophils Relative %: 65 %
Platelet Count: 280 10*3/uL (ref 150–400)
RBC: 3.45 MIL/uL — ABNORMAL LOW (ref 3.87–5.11)
RDW: 13.4 % (ref 11.5–15.5)
WBC Count: 4.1 10*3/uL (ref 4.0–10.5)
nRBC: 0 % (ref 0.0–0.2)

## 2020-08-19 MED ORDER — GOSERELIN ACETATE 3.6 MG ~~LOC~~ IMPL
3.6000 mg | DRUG_IMPLANT | Freq: Once | SUBCUTANEOUS | Status: AC
  Administered 2020-08-19: 3.6 mg via SUBCUTANEOUS

## 2020-08-19 MED ORDER — DIPHENHYDRAMINE HCL 50 MG/ML IJ SOLN
INTRAMUSCULAR | Status: AC
  Filled 2020-08-19: qty 1

## 2020-08-19 MED ORDER — COLD PACK MISC ONCOLOGY
1.0000 | Freq: Once | Status: AC | PRN
  Administered 2020-08-19: 1 via TOPICAL
  Filled 2020-08-19: qty 1

## 2020-08-19 MED ORDER — FAMOTIDINE 20 MG IN NS 100 ML IVPB
20.0000 mg | Freq: Once | INTRAVENOUS | Status: AC
  Administered 2020-08-19: 20 mg via INTRAVENOUS

## 2020-08-19 MED ORDER — SODIUM CHLORIDE 0.9% FLUSH
10.0000 mL | INTRAVENOUS | Status: DC | PRN
  Administered 2020-08-19: 10 mL
  Filled 2020-08-19: qty 10

## 2020-08-19 MED ORDER — DIPHENHYDRAMINE HCL 50 MG/ML IJ SOLN
25.0000 mg | Freq: Once | INTRAMUSCULAR | Status: AC
  Administered 2020-08-19: 25 mg via INTRAVENOUS

## 2020-08-19 MED ORDER — HEPARIN SOD (PORK) LOCK FLUSH 100 UNIT/ML IV SOLN
500.0000 [IU] | Freq: Once | INTRAVENOUS | Status: AC | PRN
  Administered 2020-08-19: 500 [IU]
  Filled 2020-08-19: qty 5

## 2020-08-19 MED ORDER — SODIUM CHLORIDE 0.9% FLUSH
10.0000 mL | INTRAVENOUS | Status: DC | PRN
  Administered 2020-08-19: 10 mL via INTRAVENOUS
  Filled 2020-08-19: qty 10

## 2020-08-19 MED ORDER — GOSERELIN ACETATE 3.6 MG ~~LOC~~ IMPL
DRUG_IMPLANT | SUBCUTANEOUS | Status: AC
  Filled 2020-08-19: qty 3.6

## 2020-08-19 MED ORDER — SODIUM CHLORIDE 0.9 % IV SOLN
10.0000 mg | Freq: Once | INTRAVENOUS | Status: AC
  Administered 2020-08-19: 10 mg via INTRAVENOUS
  Filled 2020-08-19: qty 10

## 2020-08-19 MED ORDER — FAMOTIDINE 20 MG IN NS 100 ML IVPB
INTRAVENOUS | Status: AC
  Filled 2020-08-19: qty 100

## 2020-08-19 MED ORDER — SODIUM CHLORIDE 0.9 % IV SOLN
Freq: Once | INTRAVENOUS | Status: AC
  Filled 2020-08-19: qty 250

## 2020-08-19 MED ORDER — SODIUM CHLORIDE 0.9 % IV SOLN
80.0000 mg/m2 | Freq: Once | INTRAVENOUS | Status: AC
  Administered 2020-08-19: 132 mg via INTRAVENOUS
  Filled 2020-08-19: qty 22

## 2020-08-19 NOTE — Patient Instructions (Signed)
[facility] ONCOLOGY  Discharge Instructions: Thank you for choosing [facility] to provide your oncology and hematology care.   If you have a lab appointment with the [facility], please go directly to the [facility] and check in at the registration area.   Wear comfortable clothing and clothing appropriate for easy access to any Portacath or PICC line.   We strive to give you quality time with your provider. You may need to reschedule your appointment if you arrive late (15 or more minutes).  Arriving late affects you and other patients whose appointments are after yours.  Also, if you miss three or more appointments without notifying the office, you may be dismissed from the clinic at the provider's discretion.      For prescription refill requests, have your pharmacy contact our office and allow 72 hours for refills to be completed.    Today you received the following chemotherapy and/or immunotherapy agent: Paclitaxel (Taxol)   To help prevent nausea and vomiting after your treatment, we encourage you to take your nausea medication as directed.  BELOW ARE SYMPTOMS THAT SHOULD BE REPORTED IMMEDIATELY: *FEVER GREATER THAN 100.4 F (38 C) OR HIGHER *CHILLS OR SWEATING *NAUSEA AND VOMITING THAT IS NOT CONTROLLED WITH YOUR NAUSEA MEDICATION *UNUSUAL SHORTNESS OF BREATH *UNUSUAL BRUISING OR BLEEDING *URINARY PROBLEMS (pain or burning when urinating, or frequent urination) *BOWEL PROBLEMS (unusual diarrhea, constipation, pain near the anus) TENDERNESS IN MOUTH AND THROAT WITH OR WITHOUT PRESENCE OF ULCERS (sore throat, sores in mouth, or a toothache) UNUSUAL RASH, SWELLING OR PAIN  UNUSUAL VAGINAL DISCHARGE OR ITCHING   Items with * indicate a potential emergency and should be followed up as soon as possible or go to the Emergency Department if any problems should occur.  Please show the CHEMOTHERAPY ALERT CARD or IMMUNOTHERAPY ALERT CARD at  check-in to the Emergency Department and triage nurse.  Should you have questions after your visit or need to cancel or reschedule your appointment, please contact [facility]  Dept: [phone]  and follow the prompts.  Office hours are 8:00 a.m. to 4:30 p.m. Monday - Friday. Please note that voicemails left after 4:00 p.m. may not be returned until the following business day.  We are closed weekends and major holidays. You have access to a nurse at all times for urgent questions. Please call the main number to the clinic Dept: [phone] and follow the prompts.   For any non-urgent questions, you may also contact your provider using MyChart. We now offer e-Visits for anyone 47 and older to request care online for non-urgent symptoms. For details visit [email or web address].   Also download the MyChart app! Go to the app store, search "MyChart", open the app, select [facility], and log in with your MyChart username and password.  Due to Covid, a mask is required upon entering the hospital/clinic. If you do not have a mask, one will be given to you upon arrival. For doctor visits, patients may have 1 support person aged 70 or older with them. For treatment visits, patients cannot have anyone with them due to current Covid guidelines and our immunocompromised population.    Goserelin injection What is this medication? GOSERELIN (GOE se rel in) is similar to a hormone found in the body. It lowers the amount of sex hormones that the body makes. Men will have lower testosterone levels and women will have lower estrogen levels while taking this medicine. In men,  this medicine is used to treat prostate cancer; the injection is either given once per month or once every 12 weeks. A once per month injection (only) is used to treat women with endometriosis, dysfunctionaluterine bleeding, or advanced breast cancer. This medicine may be used for other purposes; ask your health  care provider orpharmacist if you have questions. COMMON BRAND NAME(S): Zoladex What should I tell my care team before I take this medication? They need to know if you have any of these conditions: bone problems diabetes heart disease history of irregular heartbeat an unusual or allergic reaction to goserelin, other medicines, foods, dyes, or preservatives pregnant or trying to get pregnant breast-feeding How should I use this medication? This medicine is for injection under the skin. It is given by a health careprofessional in a hospital or clinic setting. Talk to your pediatrician regarding the use of this medicine in children.Special care may be needed. Overdosage: If you think you have taken too much of this medicine contact apoison control center or emergency room at once. NOTE: This medicine is only for you. Do not share this medicine with others. What if I miss a dose? It is important not to miss your dose. Call your doctor or health careprofessional if you are unable to keep an appointment. What may interact with this medication? Do not take this medicine with any of the following medications: cisapride dronedarone pimozide thioridazine This medicine may also interact with the following medications: other medicines that prolong the QT interval (an abnormal heart rhythm) This list may not describe all possible interactions. Give your health care provider a list of all the medicines, herbs, non-prescription drugs, or dietary supplements you use. Also tell them if you smoke, drink alcohol, or use illegaldrugs. Some items may interact with your medicine. What should I watch for while using this medication? Visit your doctor or health care provider for regular checks on your progress. Your symptoms may appear to get worse during the first weeks of this therapy. Tell your doctor or healthcare provider if your symptoms do not start to getbetter or if they get worse after this time. Your  bones may get weaker if you take this medicine for a long time. If you smoke or frequently drink alcohol you may increase your risk of bone loss. A family history of osteoporosis, chronic use of drugs for seizures (convulsions), or corticosteroids can also increase your risk of bone loss.Talk to your doctor about how to keep your bones strong. This medicine should stop regular monthly menstruation in women. Tell yourdoctor if you continue to menstruate. Women should not become pregnant while taking this medicine or for 12 weeks after stopping this medicine. Women should inform their doctor if they wish to become pregnant or think they might be pregnant. There is a potential for serious side effects to an unborn child. Talk to your health care professional or pharmacist for more information. Do not breast-feed an infant while takingthis medicine. Men should inform their doctors if they wish to father a child. This medicine may lower sperm counts. Talk to your health care professional or pharmacist formore information. This medicine may increase blood sugar. Ask your healthcare provider if changesin diet or medicines are needed if you have diabetes. What side effects may I notice from receiving this medication? Side effects that you should report to your doctor or health care professionalas soon as possible: allergic reactions like skin rash, itching or hives, swelling of the face, lips, or tongue bone pain  breathing problems changes in vision chest pain feeling faint or lightheaded, falls fever, chills pain, swelling, warmth in the leg pain, tingling, numbness in the hands or feet signs and symptoms of high blood sugar such as being more thirsty or hungry or having to urinate more than normal. You may also feel very tired or have blurry vision signs and symptoms of low blood pressure like dizziness; feeling faint or lightheaded, falls; unusually weak or tired stomach pain swelling of the ankles,  feet, hands trouble passing urine or change in the amount of urine unusually high or low blood pressure unusually weak or tired Side effects that usually do not require medical attention (report to yourdoctor or health care professional if they continue or are bothersome): change in sex drive or performance changes in breast size in both males and females changes in emotions or moods headache hot flashes irritation at site where injected loss of appetite skin problems like acne, dry skin vaginal dryness This list may not describe all possible side effects. Call your doctor for medical advice about side effects. You may report side effects to FDA at1-800-FDA-1088. Where should I keep my medication? This drug is given in a hospital or clinic and will not be stored at home. NOTE: This sheet is a summary. It may not cover all possible information. If you have questions about this medicine, talk to your doctor, pharmacist, orhealth care provider.  2022 Elsevier/Gold Standard (2018-04-22 14:05:56)

## 2020-08-19 NOTE — Progress Notes (Signed)
[place]   [phone] Fax:([phone]   Clinic Follow up Note   Patient Care Team: [name], Lilia Argue, MD as PCP - General (Family Medicine) Mansouraty, Telford Nab., MD as Consulting Physician (Gastroenterology) Mauro Kaufmann, RN as Oncology Nurse Navigator Rockwell Germany, RN as Oncology Nurse Navigator Donnie Mesa, MD as Consulting Physician (General Surgery) Truitt Merle, MD as Consulting Physician (Hematology) Kyung Rudd, MD as Consulting Physician (Radiation Oncology) Contogiannis, Audrea Muscat, MD as Consulting Physician (Plastic Surgery)  Date of Service:  08/19/2020  CHIEF COMPLAINT: f/u of right breast cancer  SUMMARY OF ONCOLOGIC HISTORY: Oncology History Overview Note  Cancer Staging Malignant neoplasm of upper-outer quadrant of right breast in female, estrogen receptor positive ([place]) Staging form: Breast, AJCC 8th Edition - Clinical stage from 11/12/2019: Stage IIA (cT2, cN1, cM0, G2, ER+, PR+, HER2-) - Signed by Truitt Merle, MD on 11/19/2019 Stage prefix: Initial diagnosis Histologic grading system: 3 grade system - Pathologic stage from 04/15/2020: No Stage Recommended (ypT2, pN2a, cM0, G2, ER+, PR+, HER2-) - Signed by Truitt Merle, MD on 05/27/2020 Stage prefix: Post-therapy Histologic grading system: 3 grade system Residual tumor (R): R1 - Microscopic    Malignant neoplasm of upper-outer quadrant of right breast in female, estrogen receptor positive ([place])  11/05/2019 Mammogram   IMPRESSION: Large irregular palpable mass 4.7 x 1.4 x 1.4 cm within the upper-outer right breast 11 o'clock position, 4 cm from nipple.  There is a large area of associated coarse heterogeneous calcifications within the mass. Overall findings are concerning for breast carcinoma.   Two cortically thickened right axillary lymph nodes which are indeterminate in etiology.   11/12/2019 Cancer Staging   Staging form: Breast, AJCC 8th Edition - Clinical stage  from 11/12/2019: Stage IIA (cT2, cN1, cM0, G2, ER+, PR+, HER2-) - Signed by Truitt Merle, MD on 11/19/2019    11/12/2019 Initial Biopsy   Diagnosis 1. Breast, right, needle core biopsy, right - INVASIVE MAMMARY CARCINOMA, SEE COMMENT. - MAMMARY CARCINOMA IN SITU. 2. Lymph node, needle/core biopsy, right - METASTATIC MAMMARY CARCINOMA. Microscopic Comment 1. The carcinoma appears grade 2 and measures 16 mm in greatest linear extent. E-cadherin will be ordered. Prognostic makers will be ordered. Dr. Saralyn Pilar has reviewed the case. The case was called to [place] on 01/13/2020.    1. E-cadherin is strongly positive consistent with a ductal phenotype.   11/12/2019 Receptors her2   1. PROGNOSTIC INDICATORS Results: IMMUNOHISTOCHEMICAL AND MORPHOMETRIC ANALYSIS PERFORMED MANUALLY The tumor cells are NEGATIVE for Her2 (0). Estrogen Receptor: 100%, POSITIVE, STRONG STAINING INTENSITY Progesterone Receptor: 100%, POSITIVE, STRONG STAINING INTENSITY Proliferation Marker Ki67: 5%   11/17/2019 Initial Diagnosis   Malignant neoplasm of upper-outer quadrant of right breast in female, estrogen receptor positive ([place])    11/25/2019 Breast MRI   IMPRESSION: 1. Large area of non-mass enhancement involving the UPPER OUTER QUADRANT of the RIGHT breast measuring approximately 4.2 x 4.1 x 2.4 cm. The biopsy-proven IDC and DCIS is present at the superomedial aspect of this NME. 2. No MRI evidence of malignancy involving the LEFT breast. 3. Intact BILATERAL retropectoral implants. 4. 2 pathologically enlarged RIGHT axillary lymph nodes. One of these nodes is biopsy-proven metastatic disease. No pathologic lymphadenopathy elsewhere   11/25/2019 PET scan   IMPRESSION: 1. Two mildly enlarged hypermetabolic right axillary lymph nodes compatible with right axillary nodal metastases. 2. No additional sites of hypermetabolic metastatic disease. 3. Asymmetric indistinct upper right  breast hypermetabolism, without discrete mass  correlate on the CT images, compatible with known primary right breast malignancy.     11/26/2019 Genetic Testing   Negative genetic testing: no pathogenic variants detected in [facility] STAT Breast Cancer Panel.  Variant of uncertain significance (VUS) detected in CHEK2 at c.1556G>T (p.Arg519Leu).  The report date is November 26, 2019.   The STAT Breast cancer panel offered by [facility] includes sequencing and rearrangement analysis for the following 9 genes:  ATM, BRCA1, BRCA2, CDH1, CHEK2, PALB2, PTEN, STK11 and TP53.    Results of [facility] Multi-Cancer Panel are pending.    12/01/2019 -  Anti-estrogen oral therapy   Neoadjuvant Tamoxifen 75m on 12/01/19. Reduced to 161mon 12/09/19.       ---Zoladex injection monthly starting 12/09/19.       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  Variants of uncertain significance detected RAD51D at c.715C>T (p.Arg239Trp) and CHEK2 at c.1556G>T (p.Arg519Leu).  The report date is December 08, 2019.   The Multi-Cancer Panel offered by [facility] includes sequencing and/or deletion duplication testing of the following 85 genes: AIP, ALK, APC, ATM, AXIN2,BAP1,  BARD1, BLM, BMPR1A, BRCA1, BRCA2, BRIP1, CASR, CDC73, CDH1, CDK4, CDKN1B, CDKN1C, CDKN2A (p14ARF), CDKN2A (p16INK4a), CEBPA, CHEK2, CTNNA1, DICER1, DIS3L2, EGFR (c.2369C>T, p.Thr790Met variant only), EPCAM (Deletion/duplication testing only), FH, FLCN, GATA2, GPC3, GREM1 (Promoter region deletion/duplication testing only), HOXB13 (c.251G>A, p.Gly84Glu), HRAS, KIT, MAX, MEN1, MET, MITF (c.952G>A, p.Glu318Lys variant only), MLH1, MSH2, MSH3, MSH6, MUTYH, NBN, NF1, NF2, NTHL1, PALB2, PDGFRA, PHOX2B, PMS2, POLD1, POLE, POT1, PRKAR1A, PTCH1, PTEN, RAD50, RAD51C, RAD51D, RB1, RECQL4, RET, RNF43, RUNX1, SDHAF2,  SDHA (sequence changes only), SDHB, SDHC, SDHD, SMAD4, SMARCA4, SMARCB1, SMARCE1, STK11, SUFU, TERC, TERT, TMEM127, TP53, TSC1, TSC2, VHL, WRN and WT1.    12/09/2019 Miscellaneous   Mammaprint luminal type A, low risk  10% risk of recurrence in 10 years with 97.8% benefor of hormaonal therapy alone.    01/12/2020 - 03/25/2020 Chemotherapy   Verzenio started on 01/12/2020, dose reduced to 5030mm and 100m62m11/2022 due to poor tolerance. Held Verzenio on 03/25/20 to proceed with breast surgery.    03/01/2020 Mammogram   Targeted ultrasound is performed, showing interval decreasing conspicuity of the patient's known right breast cancer. A post biopsy clip with an associated irregular area shadowing is demonstrated at the 11 o'clock position 4 cm from the nipple. Exact measurements are difficult due to the vague appearance of the findings. It measures approximately 1.2 x 0.9 x 0.6 cm (previously 4.7 x 2.2 x 1.5 cm).   IMPRESSION: Imaging findings consistent with good response to chemotherapy.   03/16/2020 Imaging   MRI Breast  IMPRESSION: 1. Interval resolution of RIGHT axillary adenopathy. 2. Slightly smaller area of non mass enhancement in the UPPER-OUTER QUADRANT of the RIGHT breast.   04/15/2020 Surgery   RIGHT BREAST LUMPECTOMY WITH RADIOACTIVE SEED LOCALIZATION and RADIOACTIVE SEED GUIDED RIGHT AXILLARY SENTINEL LYMPH NODE DISSECTION and SENTINEL NODE BIOPSY by Dr TsueGeorgette Dover3/31/2022 Pathology Results   FINAL MICROSCOPIC DIAGNOSIS:   A. 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  - Invasive carcinoma broadly involves each the anterior, posterior,  superior and inferior margins.  - DCIS involves the inferior margin and is < 1 mm from each  the anterior  and superior margins.  - Atypical lobular  hyperplasia.  - Biopsy sites.  - See oncology table.   B. TARGETED LYMPH NODE, RIGHT AXILLA, BIOPSY:  - Metastatic carcinoma in (1) of (1) lymph node.  - Biopsy site.   C. SENTINEL LYMPH NODE, RIGHT AXILLA #1, BIOPSY:  - Metastatic carcinoma in (1) of (1) lymph node.   D. SENTINEL LYMPH NODE, RIGHT AXILLA #2, BIOPSY:  - One lymph node, negative for carcinoma (0/1).    04/15/2020 Cancer Staging   Staging form: Breast, AJCC 8th Edition - Pathologic stage from 04/15/2020: No Stage Recommended (ypT2, pN2a, cM0, G2, ER+, PR+, HER2-) - Signed by Truitt Merle, MD on 05/27/2020  Stage prefix: Post-therapy  Histologic grading system: 3 grade system  Residual tumor (R): R1 - Microscopic    05/18/2020 Surgery   Right Mastectomy  A. BREAST, RIGHT, MASTECTOMY:  - Residual invasive carcinoma with mixed ductal and lobular features.       Residual invasive carcinoma broadly involves the anterior inferior  soft tissue margin.       Residual invasive carcinoma is focally < 1 mm from the deep margin.  - Residual ductal carcinoma in situ.       Residual DCIS is 3 mm from the closest margin, deep.  - Residual atypical lobular hyperplasia.  - Prior procedure site changes.  - Biopsy clip (X2).   B. BREAST IMPLANT, RIGHT, EXPLANTATION:  - Implant (gross only).   C. LYMPH NODES, RIGHT AXILLA, DISSECTION:  - Metastatic carcinoma in (2) of (12) lymph nodes.  - Prior procedure site changes in surrounding soft tissue.    06/24/2020 -  Chemotherapy    Patient is on Treatment Plan: BREAST ADJUVANT DOSE DENSE AC Q14D / PACLITAXEL Q7D          CURRENT THERAPY:  Adjuvant Taxol, q7d, starting 07/08/20  INTERVAL HISTORY:  Gal Smolinski is here for a follow up of breast cancer. She was last seen by me on 08/12/20. She presents to the clinic alone. She reports continued fatigue and hot flashes. She notes this is about the same as after her last visit. She reports continued numbness in her fingers and  toes.   All other systems were reviewed with the patient and are negative.  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   Pelvic pain 09/2008    SURGICAL HISTORY: Past Surgical History:  Procedure Laterality Date   AUGMENTATION MAMMAPLASTY Bilateral 7412   silicone    BREAST IMPLANT REMOVAL Right 05/18/2020   Procedure: REMOVAL OF RIGHT BREAST IMPLANT;  Surgeon: Donnie Mesa, MD;  Location: [place] [place];  Service: General;  Laterality: Right;   BREAST LUMPECTOMY WITH RADIOACTIVE SEED LOCALIZATION Right 04/15/2020   Procedure: RIGHT BREAST LUMPECTOMY WITH RADIOACTIVE SEED LOCALIZATION;  Surgeon: Donnie Mesa,  MD;  Location: [place];  Service: General;  Laterality: Right;   MODIFIED MASTECTOMY Right 05/18/2020   Procedure: RIGHT MODIFIED RADICAL MASTECTOMY;  Surgeon: Donnie Mesa, MD;  Location: [place];  Service: General;  Laterality: Right;   PORTACATH PLACEMENT Left 06/10/2020   Procedure: INSERTION PORT-A-CATH;  Surgeon: Donnie Mesa, MD;  Location: [place];  Service: General;  Laterality: Left;  45 MINUTES ROOM 2   RADIOACTIVE SEED GUIDED AXILLARY SENTINEL LYMPH NODE Right 04/15/2020   Procedure: RADIOACTIVE SEED GUIDED RIGHT AXILLARY SENTINEL LYMPH NODE DISSECTION;  Surgeon: Donnie Mesa, MD;  Location: [place];  Service: General;  Laterality: Right;   SENTINEL NODE BIOPSY N/A 04/15/2020    Procedure: SENTINEL NODE BIOPSY;  Surgeon: Donnie Mesa, MD;  Location: [place];  Service: General;  Laterality: N/A;   WISDOM TOOTH EXTRACTION      I have reviewed the social history and family history with the patient and they are unchanged from previous note.  ALLERGIES:  is allergic to ibuprofen.  MEDICATIONS:  Current Outpatient Medications  Medication Sig Dispense Refill   acetaminophen (TYLENOL) 500 MG tablet Take 500 mg by mouth every 6 (six) hours as needed for moderate pain.     clindamycin (CLINDAGEL) 1 % gel Apply topically 2 (two) times daily. 30 g 0   gabapentin (NEURONTIN) 100 MG capsule TAKE 2 CAPSULES(200 MG) BY MOUTH AT BEDTIME 60 capsule 1   lidocaine (LIDODERM) 5 % Place 1 patch onto the skin daily. Remove & Discard patch within 12 hours or as directed by MD 30 patch 0   lidocaine-prilocaine (EMLA) cream Apply to affected area once (Patient taking differently: Apply 1 application topically daily as needed (port access).) 30 g 3   magnesium oxide (MAG-OX) 400 (240 Mg) MG tablet Take 1 tablet (400 mg total) by mouth daily. (Patient not taking: Reported on 07/22/2020) 20 tablet 0   ondansetron (ZOFRAN) 8 MG tablet Take 1 tablet (8 mg total) by mouth every 8 (eight) hours as needed. Start on the third day after chemotherapy. 30 tablet 2   pantoprazole (PROTONIX) 40 MG tablet Take 1 tablet (40 mg total) by mouth daily. 30 tablet 3   prochlorperazine (COMPAZINE) 10 MG tablet TAKE 1 TABLET(10 MG) BY MOUTH EVERY 6 HOURS AS NEEDED FOR NAUSEA OR VOMITING 30 tablet 3   traMADol (ULTRAM) 50 MG tablet Take 1 tablet (50 mg total) by mouth every 6 (six) hours as needed (mild pain). (Patient not taking: Reported on 07/22/2020) 30 tablet 0   venlafaxine (EFFEXOR) 37.5 MG tablet Take 1 tablet (37.5 mg total) by mouth daily. 30 tablet 30   No current facility-administered medications for this visit.    PHYSICAL EXAMINATION: ECOG PERFORMANCE STATUS: 2 - Symptomatic, <50%  confined to bed  Vitals:   08/19/20 1313  BP: 105/75  Pulse: 98  Resp: 19  Temp: 98.8 F (37.1 C)  SpO2: 99%   Wt Readings from Last 3 Encounters:  08/19/20 129 lb 6.4 oz (58.7 kg)  08/12/20 129 lb 12.8 oz (58.9 kg)  08/05/20 130 lb (59 kg)     GENERAL:alert, no distress and comfortable SKIN: skin color normal, no rashes or significant lesions EYES: normal, Conjunctiva are pink and non-injected, sclera clear  NEURO: alert & oriented x 3 with fluent speech  LABORATORY DATA:  I have reviewed the data as listed CBC Latest Ref Rng & Units 08/19/2020 08/12/2020 08/05/2020  WBC 4.0 - 10.5 K/uL 4.1 6.7  4.9  Hemoglobin 12.0 - 15.0 g/dL 10.8(L) 11.4(L) 11.1(L)  Hematocrit 36.0 - 46.0 % 31.8(L) 34.0(L) 32.5(L)  Platelets 150 - 400 K/uL 280 332 262     CMP Latest Ref Rng & Units 08/19/2020 08/12/2020 08/05/2020  Glucose 70 - 99 mg/dL 157(H) 103(H) 111(H)  BUN 6 - 20 mg/dL _0 Creatinine 0.44 - 1.00 mg/dL 0.71 0.71 0.49  Sodium 135 - 145 mmol/L 141 139 142  Potassium 3.5 - 5.1 mmol/L 3.5 3.9 3.6  Chloride 98 - 111 mmol/L 105 104 105  CO2 22 - 32 mmol/L _1 Calcium 8.9 - 10.3 mg/dL 9.7 10.0 9.8  Total Protein 6.5 - 8.1 g/dL 7.2 7.9 7.4  Total Bilirubin 0.3 - 1.2 mg/dL 0.5 0.5 0.5  Alkaline Phos 38 - 126 U/L 87 106 101  AST 15 - 41 U/L 41 90(H) 195(HH)  ALT 0 - 44 U/L 117(H) 269(H) 352(HH)      RADIOGRAPHIC STUDIES: I have personally reviewed the radiological images as listed and agreed with the findings in the report. No results found.   ASSESSMENT & PLAN:  Caitlyn Miller is a 35 y.o. female with   1. Malignant neoplasm of upper-outer quadrant of right breast, Stage IIA, p(T2N1aM0), ER+/PR+/HER2-, Grade II, Mammaprint luminal type A, low risk -She has a 4.7cm right breast mass at 11:00 position and two abnormal right axillary LN. 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 -Her 11/2019 breast MRI and PET scan did not indicate left breast or distant malignancy or metastasis.  -Mammaprint showed low risk disease and neoadjuvant chemotherapy is not recommended.  -She started neoadjuvant tamoxifen on 12/01/19. She tolerated poorly with nausea, and diffuse body aches. Dose reduced to 65m from 12/09/19 then switched to anastrozole on 01/06/20. I started her on monthly Zoladex injection on 12/09/19. -I started her on Verzenio on 01/12/2020, dose reduced to 512mam and 10080m/11/2020 due to poor tolerance. Held Verzenio on 03/25/20 to proceed with breast surgery. -Given her 03/01/20 [place] mammogram indicated good response to treatment, she proceeded with right lumpectomy and sentinel LN dissection on 04/15/20 with Dr TsuGeorgette Doverurgical pathology showed larger than expected tumor at 3.1cm of invasive carcinoma with mixed ductal and lobular features and DCIS components. She also had positive margins and 2/3 positive LNs. -She underwent mastectomy and axillary node dissection on 05/18/20 under Dr. TsuGeorgette Doverowing: residual invasive carcinoma and DCIS. She again had a positive margin and another 2/12 positive nodes. She had stage III disease.  -Due to her N2 disease, I recommend adjuvant chemotherapy to reduce her risk of recurrence, followed by postmastectomy radiation to reduce her risk of local recurrence. -She continues Zoladex injection q4weeks.  -She had first cycle AC on 06/24/20 and tolerated very poorly with a variety of symptoms causing 2 ED visits. -We moved to weekly Taxol on 07/08/20. She tolerated it better -Lab reviewed, liver function improved.  We will continue weekly Taxol -f/u in 2 weeks    2. 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 She is on gabapentin BID at night (did not tolerate higher dose)   3.   Transaminitis -Developed after chemo Taxol, likely secondary to chemotherapy -Ultrasound of liver 07/29/20 was unremarkable -She knows to avoid Tylenol, she does not drink alcohol -Her  liver functions today are pending. -Continue monitoring closely, will continue Taxol if AST and ALT less than 500, with normal bilirubin   4. Digestive issues, reflux -She is on pantoprazole. Will continue -She again asked about supplements and if they would be okay to take. I will consult with our pharmacist and let her know.   5. Generalized Weakness, body pain, Fatigue, hot flushes  -Pt notes for the past 3+ years she has had mid chest and mid back pain. Previously intermittent, now constant. She also notes b/l arm and leg weakness and pain -This has lead to fatigue, low appetite, decreased daily function/activity. -Pt notes prior workup for this in [place] was negative with Neurologist and cardiologist. They said this is related to stress, anxiety or depression. Pt feels she has an illness causing this instead. -Her 11/25/19 PET was negative for any distant malignancy or metastasis.  -This has returned to baseline since stopping AC treatment. -will increase her Effexor for her worsening hot flashes   6. Anxiety, depression -She was previously on Lexapro.  I switched her to Effexor, she is tolerating better  -I previously discussed increasing Effexor to twice daily. Given her worsening hot flashes, today she is willing to try.   7. 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   -She has discussed with our genetic consoler    8. Social Support -She is from [place] and lives both there and in [place] with her husband. She has 2 teenage children. The rest of her family is in [place].  -She notes her mother is living with her now. Her mother is able to help her, especially with her kids.     PLAN:   -Proceed with C6 taxol and weekly x6 more  -Proceed with Zoladex today and every 4 weeks -We will increase Effexor from 75 mg to 112.32m daily to better control her hot flashes, she will continue gabapentin 100 mg at night -lab and f/u in 2 weeks   No problem-specific Assessment & Plan notes found for this encounter.   No orders of the defined types were placed in this encounter.  All questions were answered. The patient knows to call the clinic with any problems, questions or concerns. No barriers to learning was detected. The total time spent in the appointment was 30 minutes.     YTruitt Merle MD 08/19/2020   I, KWilburn Mylar am acting as scribe for YTruitt Merle MD.   I have reviewed the above documentation for accuracy and completeness, and I agree with the above.

## 2020-08-19 NOTE — Progress Notes (Signed)
Per Dr. Burr Medico, ok for treatment today with ALT 117.

## 2020-08-26 ENCOUNTER — Other Ambulatory Visit: Payer: Self-pay

## 2020-08-26 ENCOUNTER — Inpatient Hospital Stay: Payer: 59

## 2020-08-26 ENCOUNTER — Inpatient Hospital Stay: Payer: 59 | Admitting: Nurse Practitioner

## 2020-08-26 VITALS — BP 93/65 | HR 75 | Temp 98.1°F | Resp 18

## 2020-08-26 DIAGNOSIS — Z170 Estrogen receptor positive status [ER+]: Secondary | ICD-10-CM

## 2020-08-26 DIAGNOSIS — Z95828 Presence of other vascular implants and grafts: Secondary | ICD-10-CM

## 2020-08-26 DIAGNOSIS — C50411 Malignant neoplasm of upper-outer quadrant of right female breast: Secondary | ICD-10-CM

## 2020-08-26 DIAGNOSIS — Z5111 Encounter for antineoplastic chemotherapy: Secondary | ICD-10-CM | POA: Diagnosis not present

## 2020-08-26 LAB — CBC WITH DIFFERENTIAL (CANCER CENTER ONLY)
Abs Immature Granulocytes: 0.07 10*3/uL (ref 0.00–0.07)
Basophils Absolute: 0 10*3/uL (ref 0.0–0.1)
Basophils Relative: 1 %
Eosinophils Absolute: 0.1 10*3/uL (ref 0.0–0.5)
Eosinophils Relative: 1 %
HCT: 33.6 % — ABNORMAL LOW (ref 36.0–46.0)
Hemoglobin: 11.3 g/dL — ABNORMAL LOW (ref 12.0–15.0)
Immature Granulocytes: 1 %
Lymphocytes Relative: 42 %
Lymphs Abs: 2.3 10*3/uL (ref 0.7–4.0)
MCH: 31.1 pg (ref 26.0–34.0)
MCHC: 33.6 g/dL (ref 30.0–36.0)
MCV: 92.6 fL (ref 80.0–100.0)
Monocytes Absolute: 0.2 10*3/uL (ref 0.1–1.0)
Monocytes Relative: 4 %
Neutro Abs: 2.8 10*3/uL (ref 1.7–7.7)
Neutrophils Relative %: 51 %
Platelet Count: 341 10*3/uL (ref 150–400)
RBC: 3.63 MIL/uL — ABNORMAL LOW (ref 3.87–5.11)
RDW: 14.1 % (ref 11.5–15.5)
WBC Count: 5.5 10*3/uL (ref 4.0–10.5)
nRBC: 0 % (ref 0.0–0.2)

## 2020-08-26 LAB — CMP (CANCER CENTER ONLY)
ALT: 148 U/L — ABNORMAL HIGH (ref 0–44)
AST: 59 U/L — ABNORMAL HIGH (ref 15–41)
Albumin: 4.3 g/dL (ref 3.5–5.0)
Alkaline Phosphatase: 81 U/L (ref 38–126)
Anion gap: 10 (ref 5–15)
BUN: 11 mg/dL (ref 6–20)
CO2: 26 mmol/L (ref 22–32)
Calcium: 9.9 mg/dL (ref 8.9–10.3)
Chloride: 105 mmol/L (ref 98–111)
Creatinine: 0.73 mg/dL (ref 0.44–1.00)
GFR, Estimated: >60 mL/min (ref >60)
Glucose, Bld: 117 mg/dL — ABNORMAL HIGH (ref 70–99)
Potassium: 3.9 mmol/L (ref 3.5–5.1)
Sodium: 141 mmol/L (ref 135–145)
Total Bilirubin: 0.6 mg/dL (ref 0.3–1.2)
Total Protein: 7.6 g/dL (ref 6.5–8.1)

## 2020-08-26 MED ORDER — SODIUM CHLORIDE 0.9 % IV SOLN
80.0000 mg/m2 | Freq: Once | INTRAVENOUS | Status: AC
  Administered 2020-08-26: 132 mg via INTRAVENOUS
  Filled 2020-08-26: qty 22

## 2020-08-26 MED ORDER — DIPHENHYDRAMINE HCL 50 MG/ML IJ SOLN
25.0000 mg | Freq: Once | INTRAMUSCULAR | Status: AC
  Administered 2020-08-26: 25 mg via INTRAVENOUS
  Filled 2020-08-26: qty 1

## 2020-08-26 MED ORDER — FAMOTIDINE 20 MG IN NS 100 ML IVPB
20.0000 mg | Freq: Once | INTRAVENOUS | Status: AC
  Administered 2020-08-26: 20 mg via INTRAVENOUS
  Filled 2020-08-26: qty 100

## 2020-08-26 MED ORDER — SODIUM CHLORIDE 0.9% FLUSH
10.0000 mL | INTRAVENOUS | Status: AC | PRN
  Administered 2020-08-26: 10 mL
  Filled 2020-08-26: qty 10

## 2020-08-26 MED ORDER — DIPHENHYDRAMINE HCL 50 MG/ML IJ SOLN
INTRAMUSCULAR | Status: AC
  Filled 2020-08-26: qty 1

## 2020-08-26 MED ORDER — SODIUM CHLORIDE 0.9 % IV SOLN
10.0000 mg | Freq: Once | INTRAVENOUS | Status: AC
  Administered 2020-08-26: 10 mg via INTRAVENOUS
  Filled 2020-08-26: qty 10

## 2020-08-26 MED ORDER — SODIUM CHLORIDE 0.9% FLUSH
10.0000 mL | INTRAVENOUS | Status: DC | PRN
  Administered 2020-08-26: 10 mL
  Filled 2020-08-26: qty 10

## 2020-08-26 MED ORDER — SODIUM CHLORIDE 0.9 % IV SOLN
Freq: Once | INTRAVENOUS | Status: AC
  Filled 2020-08-26: qty 250

## 2020-08-26 MED ORDER — HEPARIN SOD (PORK) LOCK FLUSH 100 UNIT/ML IV SOLN
500.0000 [IU] | Freq: Once | INTRAVENOUS | Status: AC | PRN
  Administered 2020-08-26: 500 [IU]
  Filled 2020-08-26: qty 5

## 2020-08-26 NOTE — Patient Instructions (Signed)
[facility]ICAL [facility]COL[facility]   Discharge Instructions: Thank you for choosing [facility] to provide your oncology and hematology care.   If you have a lab appointment with the Cancer Center, please go directly to the Cancer Center and check in at the registration area.   Wear comfortable clothing and clothing appropriate for easy access to any Portacath or PICC line.   We strive to give you quality time with your provider. You may need to reschedule your appointment if you arrive late (15 or more minutes).  Arriving late affects you and other patients whose appointments are after yours.  Also, if you miss three or more appointments without notifying the office, you may be dismissed from the clinic at the provider's discretion.      For prescription refill requests, have your pharmacy contact our office and allow 72 hours for refills to be completed.    Today you received the following chemotherapy and/or immunotherapy agents: paclitaxel.      To help prevent nausea and vomiting after your treatment, we encourage you to take your nausea medication as directed.  BELOW ARE SYMPTOMS THAT SHOULD BE REPORTED IMMEDIATELY: *FEVER GREATER THAN 100.4 F (38 C) OR HIGHER *CHILLS OR SWEATING *NAUSEA AND VOMITING THAT IS NOT CONTROLLED WITH YOUR NAUSEA MEDICATION *UNUSUAL SHORTNESS OF BREATH *UNUSUAL BRUISING OR BLEEDING *URINARY PROBLEMS (pain or burning when urinating, or frequent urination) *BOWEL PROBLEMS (unusual diarrhea, constipation, pain near the anus) TENDERNESS IN MOUTH AND THROAT WITH OR WITHOUT PRESENCE OF ULCERS (sore throat, sores in mouth, or a toothache) UNUSUAL RASH, SWELLING OR PAIN  UNUSUAL VAGINAL DISCHARGE OR ITCHING   Items with * indicate a potential emergency and should be followed up as soon as possible or go to the Emergency Department if any problems should occur.  Please show the CHEMOTHERAPY ALERT CARD or IMMUNOTHERAPY ALERT CARD at check-in  to the Emergency Department and triage nurse.  Should you have questions after your visit or need to cancel or reschedule your appointment, please contact [facility]  Dept: [phone]  and follow the prompts.  Office hours are 8:00 a.m. to 4:30 p.m. Monday - Friday. Please note that voicemails left after 4:00 p.m. may not be returned until the following business day.  We are closed weekends and major holidays. You have access to a nurse at all times for urgent questions. Please call the main number to the clinic Dept: [phone] and follow the prompts.   For any non-urgent questions, you may also contact your provider using MyChart. We now offer e-Visits for anyone 18 and older to request care online for non-urgent symptoms. For details visit [email or web address].   Also download the MyChart app! Go to the app store, search "MyChart", open the app, select [facility], and log in with your MyChart username and password.  Due to Covid, a mask is required upon entering the hospital/clinic. If you do not have a mask, one will be given to you upon arrival. For doctor visits, patients may have 1 support person aged 18 or older with them. For treatment visits, patients cannot have anyone with them due to current Covid guidelines and our immunocompromised population.   

## 2020-08-26 NOTE — Progress Notes (Signed)
Ok to treat with elevated ALT

## 2020-09-01 ENCOUNTER — Encounter: Payer: Self-pay | Admitting: Physical Therapy

## 2020-09-01 ENCOUNTER — Other Ambulatory Visit: Payer: Self-pay

## 2020-09-01 ENCOUNTER — Ambulatory Visit: Payer: 59 | Admitting: Physical Therapy

## 2020-09-01 DIAGNOSIS — Z483 Aftercare following surgery for neoplasm: Secondary | ICD-10-CM

## 2020-09-01 DIAGNOSIS — M25611 Stiffness of right shoulder, not elsewhere classified: Secondary | ICD-10-CM | POA: Diagnosis not present

## 2020-09-01 DIAGNOSIS — R293 Abnormal posture: Secondary | ICD-10-CM

## 2020-09-01 DIAGNOSIS — C50411 Malignant neoplasm of upper-outer quadrant of right female breast: Secondary | ICD-10-CM

## 2020-09-01 NOTE — Therapy (Signed)
[place], [place], [address] Phone: [phone]   Fax:  [phone]  Physical Therapy Treatment  Patient Details  Name: Caitlyn Miller MRN: [id number] Date of Birth: 11-27-1985 Referring Provider (PT): Dr. Rodman Key Tsuei/Feng   Encounter Date: 09/01/2020   PT End of Session - 09/01/20 1055     Visit Number 6    Number of Visits 12    Date for PT Re-Evaluation 11/24/20    PT Start Time 1005    PT Stop Time 1884    PT Time Calculation (min) 46 min    Activity Tolerance Patient tolerated treatment well    Behavior During Therapy [facility] for tasks assessed/performed             Past Medical History:  Diagnosis Date   Abnormal Pap smear    Acid reflux    Anemia    Anxiety    Breast cancer ([place])    Decreased appetite 10/08   Depression    Endometriosis 10/2004   Epigastric pain 10/2006   Family history of breast cancer 11/19/2019   FH: migraines    GBS carrier    H/O amenorrhea 06/2006   H/O dyspareunia 7/07   H/O fatigue    H/O nausea and vomiting 10/2006   H/O rubella    H/O varicella    Hyperemesis arising during pregnancy    First pregnancy   Irregular periods/menstrual cycles 01/6604   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   Pelvic pain 09/2008    Past Surgical History:  Procedure Laterality Date   AUGMENTATION MAMMAPLASTY Bilateral 0932   silicone    BREAST IMPLANT REMOVAL Right 05/18/2020   Procedure: REMOVAL OF RIGHT BREAST IMPLANT;  Surgeon: Donnie Mesa, MD;  Location: [place];  Service: General;  Laterality: Right;   BREAST LUMPECTOMY WITH RADIOACTIVE SEED LOCALIZATION Right 04/15/2020   Procedure: RIGHT BREAST LUMPECTOMY WITH RADIOACTIVE SEED LOCALIZATION;  Surgeon: Donnie Mesa, MD;  Location: [place];  Service: General;  Laterality: Right;   MODIFIED MASTECTOMY Right 05/18/2020   Procedure: RIGHT MODIFIED RADICAL MASTECTOMY;  Surgeon: Donnie Mesa,  MD;  Location: [place];  Service: General;  Laterality: Right;   PORTACATH PLACEMENT Left 06/10/2020   Procedure: INSERTION PORT-A-CATH;  Surgeon: Donnie Mesa, MD;  Location: [place];  Service: General;  Laterality: Left;  45 MINUTES ROOM 2   RADIOACTIVE SEED GUIDED AXILLARY SENTINEL LYMPH NODE Right 04/15/2020   Procedure: RADIOACTIVE SEED GUIDED RIGHT AXILLARY SENTINEL LYMPH NODE DISSECTION;  Surgeon: Donnie Mesa, MD;  Location: [place];  Service: General;  Laterality: Right;   SENTINEL NODE BIOPSY N/A 04/15/2020   Procedure: SENTINEL NODE BIOPSY;  Surgeon: Donnie Mesa, MD;  Location: [place];  Service: General;  Laterality: N/A;   WISDOM TOOTH EXTRACTION      There were no vitals filed for this visit.   Subjective Assessment - 09/01/20 1006     Subjective I am feeling better. My range of motion is better. The area on my side is doing better but still has some tightness.    Pertinent History Patient was diagnosed on 11/05/2019 with right grade II invasive ductal carcinoma breast cancer. It measures 4.7 cm and is located in the upper outer quadrant. It is ER/PR positive and HER2 negative with a Ki67 of 5%. She has a biopsied positive axillary lymph node. She had a Right lumpectomy with SLNB (2/3+) performed on 04/15/2020,  right modified mastectomy  ALND (2/12), currently doing chemo and will need radiation    Patient Stated Goals Get back to normal use of right UE    Currently in Pain? No/denies    Pain Score 0-No pain                       Katina Dung - 09/01/20 0001     Open a tight or new jar No difficulty    Do heavy household chores (wash walls, wash floors) Mild difficulty    Carry a shopping bag or briefcase Mild difficulty    Wash your back No difficulty    Use a knife to cut food No difficulty    Recreational activities in which you take some force or impact through your arm, shoulder, or hand (golf,  hammering, tennis) Mild difficulty    During the past week, to what extent has your arm, shoulder or hand problem interfered with your normal social activities with family, friends, neighbors, or groups? Slightly    During the past week, to what extent has your arm, shoulder or hand problem limited your work or other regular daily activities Slightly    Arm, shoulder, or hand pain. None    Tingling (pins and needles) in your arm, shoulder, or hand Mild    Difficulty Sleeping No difficulty    DASH Score 13.64 %                    [facility] Adult PT Treatment/Exercise - 09/01/20 0001       Manual Therapy   Soft tissue mobilization with cocoa butter in left sidelying to R lateral trunk including serratus, lats, rhomboids, paraspinals and teres with numerous areas of tightness noted that improved with soft tissue mobilization                         PT Long Term Goals - 09/01/20 1007       PT LONG TERM GOAL #1   Title Patient will demonstrate she has regained near  full shoulder ROM and function post operatively compared to baselines.    Baseline End ROM improved but still limited by cording- 05/13/20; 07/15/20- pt limited at end range by tightness though ROM has improved; 09/01/20- pt is no longer limited at end range    Time 6    Period Weeks    Status Achieved      PT LONG TERM GOAL #2   Title Pt will have decreased pain by atleast 50%    Baseline Pt reports no pain at this time as motion improves-05/13/20    Time 3    Period Weeks    Status Achieved      PT LONG TERM GOAL #3   Title Pt will have quick dash no greater than 20% for return to function prior to next surgery    Baseline 70%; pt did not retake this today but reports no longer feeling limited with reaching and ADLs-05/13/20; 09/01/20- 13.64    Time 3    Period Weeks    Status Achieved      PT LONG TERM GOAL #4   Title pt will be able to dress, bathe and perform home activities with improved ease     Time 3    Period Weeks    Status Achieved      PT LONG TERM GOAL #5   Title Pt will be independent in a home exercise program  for continued strengthening and stretching so she can avoid increased tightness with radiation    Time 6    Period Weeks    Status On-going                   Plan - 09/01/20 1056     Clinical Impression Statement Assessed pt's progress towards goals in therapy. Pt is progressing towards goals but continues to have increased muscle tightness in R scapular muscles and along R lateral trunk. Focused today on soft tissue mobilization to the area using cocoa butter and tightness improved greatly but was still evident in serratus. Educated pt on how to use a tennis ball on the wall to help decrease muscle tightness. Pt would benefit from additional skilled PT visits to continue to decrease muscle tightness and instruct pt in a home exercise program for strengthening and stretching to decrease risk of fatigue and decreased ROM once pt begins radiation next month.    PT Frequency Biweekly    PT Duration 12 weeks    PT Treatment/Interventions ADLs/Self Care Home Management;Therapeutic exercise;Patient/family education;Manual techniques;Manual lymph drainage;Scar mobilization;Passive range of motion;Orthotic Fit/Training    PT Next Visit Plan see how R serratus feels after last session,and instructing in an HEP for R shoulder ROM and strength, did [facility] take her insurance?, assist with obtaining prophylactic compression sleeve, instruct in Strength ABC - pt to begin radiation in [place] op shoulder ROM HEP (supine for flex and stargazer), supine dowel flex and abd    Consulted and Agree with Plan of Care Patient             Patient will benefit from skilled therapeutic intervention in order to improve the following deficits and impairments:  Postural dysfunction, Decreased range of motion, Impaired UE functional use, Pain,  Decreased knowledge of precautions, Decreased activity tolerance, Impaired sensation, Decreased strength, Increased fascial restricitons  Visit Diagnosis: Stiffness of right shoulder, not elsewhere classified  Aftercare following surgery for neoplasm  Abnormal posture  Malignant neoplasm of upper-outer quadrant of right breast in female, estrogen receptor positive ([place])     Problem List Patient Active Problem List   Diagnosis Date Noted   Menopausal syndrome (hot flushes) 40/98/1191   Monoallelic mutation of [id number] gene 12/19/2019   Genetic testing 11/26/2019   Family history of breast cancer 11/19/2019   Malignant neoplasm of upper-outer quadrant of right breast in female, estrogen receptor positive ([place]) 11/17/2019   S/P breast augmentation 09/18/2019   Hx of migraines 06/20/2011   GERD (gastroesophageal reflux disease) 06/20/2011   Anemia 06/20/2011    Allyson Sabal [facility] 09/01/2020, [place], [place], [address] Phone: [phone]   Fax:  [phone]  Name: Kavitha Lansdale MRN: [id number] Date of Birth: [birth year]   Manus Gunning, PT 09/01/20 11:22 AM

## 2020-09-02 ENCOUNTER — Inpatient Hospital Stay: Payer: 59

## 2020-09-02 ENCOUNTER — Inpatient Hospital Stay (HOSPITAL_BASED_OUTPATIENT_CLINIC_OR_DEPARTMENT_OTHER): Payer: 59 | Admitting: Hematology

## 2020-09-02 ENCOUNTER — Encounter: Payer: Self-pay | Admitting: Hematology

## 2020-09-02 VITALS — BP 95/73 | HR 120 | Temp 97.7°F | Wt 131.4 lb

## 2020-09-02 VITALS — HR 89

## 2020-09-02 DIAGNOSIS — C50411 Malignant neoplasm of upper-outer quadrant of right female breast: Secondary | ICD-10-CM

## 2020-09-02 DIAGNOSIS — Z170 Estrogen receptor positive status [ER+]: Secondary | ICD-10-CM

## 2020-09-02 DIAGNOSIS — Z5111 Encounter for antineoplastic chemotherapy: Secondary | ICD-10-CM | POA: Diagnosis not present

## 2020-09-02 DIAGNOSIS — Z95828 Presence of other vascular implants and grafts: Secondary | ICD-10-CM

## 2020-09-02 LAB — CBC WITH DIFFERENTIAL (CANCER CENTER ONLY)
Abs Immature Granulocytes: 0.06 10*3/uL (ref 0.00–0.07)
Basophils Absolute: 0 10*3/uL (ref 0.0–0.1)
Basophils Relative: 1 %
Eosinophils Absolute: 0 10*3/uL (ref 0.0–0.5)
Eosinophils Relative: 1 %
HCT: 32.2 % — ABNORMAL LOW (ref 36.0–46.0)
Hemoglobin: 11 g/dL — ABNORMAL LOW (ref 12.0–15.0)
Immature Granulocytes: 1 %
Lymphocytes Relative: 43 %
Lymphs Abs: 2.4 10*3/uL (ref 0.7–4.0)
MCH: 31.9 pg (ref 26.0–34.0)
MCHC: 34.2 g/dL (ref 30.0–36.0)
MCV: 93.3 fL (ref 80.0–100.0)
Monocytes Absolute: 0.3 10*3/uL (ref 0.1–1.0)
Monocytes Relative: 6 %
Neutro Abs: 2.7 10*3/uL (ref 1.7–7.7)
Neutrophils Relative %: 48 %
Platelet Count: 333 10*3/uL (ref 150–400)
RBC: 3.45 MIL/uL — ABNORMAL LOW (ref 3.87–5.11)
RDW: 14.1 % (ref 11.5–15.5)
WBC Count: 5.6 10*3/uL (ref 4.0–10.5)
nRBC: 0 % (ref 0.0–0.2)

## 2020-09-02 LAB — CMP (CANCER CENTER ONLY)
ALT: 69 U/L — ABNORMAL HIGH (ref 0–44)
AST: 38 U/L (ref 15–41)
Albumin: 4.2 g/dL (ref 3.5–5.0)
Alkaline Phosphatase: 67 U/L (ref 38–126)
Anion gap: 10 (ref 5–15)
BUN: 14 mg/dL (ref 6–20)
CO2: 25 mmol/L (ref 22–32)
Calcium: 9.7 mg/dL (ref 8.9–10.3)
Chloride: 105 mmol/L (ref 98–111)
Creatinine: 0.73 mg/dL (ref 0.44–1.00)
GFR, Estimated: >60 mL/min (ref >60)
Glucose, Bld: 129 mg/dL — ABNORMAL HIGH (ref 70–99)
Potassium: 3.7 mmol/L (ref 3.5–5.1)
Sodium: 140 mmol/L (ref 135–145)
Total Bilirubin: 0.5 mg/dL (ref 0.3–1.2)
Total Protein: 7.5 g/dL (ref 6.5–8.1)

## 2020-09-02 MED ORDER — FAMOTIDINE 20 MG IN NS 100 ML IVPB
20.0000 mg | Freq: Once | INTRAVENOUS | Status: AC
  Administered 2020-09-02: 20 mg via INTRAVENOUS

## 2020-09-02 MED ORDER — DIPHENHYDRAMINE HCL 50 MG/ML IJ SOLN
INTRAMUSCULAR | Status: AC
  Filled 2020-09-02: qty 1

## 2020-09-02 MED ORDER — SODIUM CHLORIDE 0.9% FLUSH
10.0000 mL | INTRAVENOUS | Status: AC | PRN
  Administered 2020-09-02: 10 mL

## 2020-09-02 MED ORDER — FAMOTIDINE 20 MG IN NS 100 ML IVPB
INTRAVENOUS | Status: AC
  Filled 2020-09-02: qty 100

## 2020-09-02 MED ORDER — SODIUM CHLORIDE 0.9 % IV SOLN: Freq: Once | INTRAVENOUS | Status: AC

## 2020-09-02 MED ORDER — SODIUM CHLORIDE 0.9 % IV SOLN
80.0000 mg/m2 | Freq: Once | INTRAVENOUS | Status: AC
  Administered 2020-09-02: 132 mg via INTRAVENOUS
  Filled 2020-09-02: qty 22

## 2020-09-02 MED ORDER — SODIUM CHLORIDE 0.9 % IV SOLN
10.0000 mg | Freq: Once | INTRAVENOUS | Status: AC
  Administered 2020-09-02: 10 mg via INTRAVENOUS
  Filled 2020-09-02: qty 10

## 2020-09-02 MED ORDER — DIPHENHYDRAMINE HCL 50 MG/ML IJ SOLN
25.0000 mg | Freq: Once | INTRAMUSCULAR | Status: AC
  Administered 2020-09-02: 25 mg via INTRAVENOUS

## 2020-09-02 NOTE — Progress Notes (Signed)
[place]   [phone] Fax:([phone]   Clinic Follow up Note   Patient Care Team: [name], Lilia Argue, MD as PCP - General (Family Medicine) Mansouraty, Telford Nab., MD as Consulting Physician (Gastroenterology) Mauro Kaufmann, RN as Oncology Nurse Navigator Rockwell Germany, RN as Oncology Nurse Navigator Donnie Mesa, MD as Consulting Physician (General Surgery) Truitt Merle, MD as Consulting Physician (Hematology) Kyung Rudd, MD as Consulting Physician (Radiation Oncology) Contogiannis, Audrea Muscat, MD as Consulting Physician (Plastic Surgery)  Date of Service:  09/02/2020  CHIEF COMPLAINT: f/u of right breast cancer  SUMMARY OF ONCOLOGIC HISTORY: Oncology History Overview Note  Cancer Staging Malignant neoplasm of upper-outer quadrant of right breast in female, estrogen receptor positive ([place]) Staging form: Breast, AJCC 8th Edition - Clinical stage from 11/12/2019: Stage IIA (cT2, cN1, cM0, G2, ER+, PR+, HER2-) - Signed by Truitt Merle, MD on 11/19/2019 Stage prefix: Initial diagnosis Histologic grading system: 3 grade system - Pathologic stage from 04/15/2020: No Stage Recommended (ypT2, pN2a, cM0, G2, ER+, PR+, HER2-) - Signed by Truitt Merle, MD on 05/27/2020 Stage prefix: Post-therapy Histologic grading system: 3 grade system Residual tumor (R): R1 - Microscopic    Malignant neoplasm of upper-outer quadrant of right breast in female, estrogen receptor positive ([place])  11/05/2019 Mammogram   IMPRESSION: Large irregular palpable mass 4.7 x 1.4 x 1.4 cm within the upper-outer right breast 11 o'clock position, 4 cm from nipple.  There is a large area of associated coarse heterogeneous calcifications within the mass. Overall findings are concerning for breast carcinoma.   Two cortically thickened right axillary lymph nodes which are indeterminate in etiology.   11/12/2019 Cancer Staging   Staging form: Breast, AJCC 8th Edition - Clinical  stage from 11/12/2019: Stage IIA (cT2, cN1, cM0, G2, ER+, PR+, HER2-) - Signed by Truitt Merle, MD on 11/19/2019   11/12/2019 Initial Biopsy   Diagnosis 1. Breast, right, needle core biopsy, right - INVASIVE MAMMARY CARCINOMA, SEE COMMENT. - MAMMARY CARCINOMA IN SITU. 2. Lymph node, needle/core biopsy, right - METASTATIC MAMMARY CARCINOMA. Microscopic Comment 1. The carcinoma appears grade 2 and measures 16 mm in greatest linear extent. E-cadherin will be ordered. Prognostic makers will be ordered. Dr. Saralyn Pilar has reviewed the case. The case was called to [facility] on 01/13/2020.    1. E-cadherin is strongly positive consistent with a ductal phenotype.   11/12/2019 Receptors her2   1. PROGNOSTIC INDICATORS Results: IMMUNOHISTOCHEMICAL AND MORPHOMETRIC ANALYSIS PERFORMED MANUALLY The tumor cells are NEGATIVE for Her2 (0). Estrogen Receptor: 100%, POSITIVE, STRONG STAINING INTENSITY Progesterone Receptor: 100%, POSITIVE, STRONG STAINING INTENSITY Proliferation Marker Ki67: 5%   11/17/2019 Initial Diagnosis   Malignant neoplasm of upper-outer quadrant of right breast in female, estrogen receptor positive ([place])   11/25/2019 Breast MRI   IMPRESSION: 1. Large area of non-mass enhancement involving the UPPER OUTER QUADRANT of the RIGHT breast measuring approximately 4.2 x 4.1 x 2.4 cm. The biopsy-proven IDC and DCIS is present at the superomedial aspect of this NME. 2. No MRI evidence of malignancy involving the LEFT breast. 3. Intact BILATERAL retropectoral implants. 4. 2 pathologically enlarged RIGHT axillary lymph nodes. One of these nodes is biopsy-proven metastatic disease. No pathologic lymphadenopathy elsewhere   11/25/2019 PET scan   IMPRESSION: 1. Two mildly enlarged hypermetabolic right axillary lymph nodes compatible with right axillary nodal metastases. 2. No additional sites of hypermetabolic metastatic disease. 3. Asymmetric indistinct upper right  breast hypermetabolism, without discrete mass correlate on  the CT images, compatible with known primary right breast malignancy.     11/26/2019 Genetic Testing   Negative genetic testing: no pathogenic variants detected in [facility] STAT Breast Cancer Panel.  Variant of uncertain significance (VUS) detected in CHEK2 at c.1556G>T (p.Arg519Leu).  The report date is November 26, 2019.   The STAT Breast cancer panel offered by [facility] includes sequencing and rearrangement analysis for the following 9 genes:  ATM, BRCA1, BRCA2, CDH1, CHEK2, PALB2, PTEN, STK11 and TP53.    Results of [facility] Multi-Cancer Panel are pending.    12/01/2019 -  Anti-estrogen oral therapy   Neoadjuvant Tamoxifen 64m on 12/01/19. Reduced to 142mon 12/09/19.       ---Zoladex injection monthly starting 12/09/19.       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  Variants of uncertain significance detected RAD51D at c.715C>T (p.Arg239Trp) and CHEK2 at c.1556G>T (p.Arg519Leu).  The report date is December 08, 2019.   The Multi-Cancer Panel offered by [facility] includes sequencing and/or deletion duplication testing of the following 85 genes: AIP, ALK, APC, ATM, AXIN2,BAP1,  BARD1, BLM, BMPR1A, BRCA1, BRCA2, BRIP1, CASR, CDC73, CDH1, CDK4, CDKN1B, CDKN1C, CDKN2A (p14ARF), CDKN2A (p16INK4a), CEBPA, CHEK2, CTNNA1, DICER1, DIS3L2, EGFR (c.2369C>T, p.Thr790Met variant only), EPCAM (Deletion/duplication testing only), FH, FLCN, GATA2, GPC3, GREM1 (Promoter region deletion/duplication testing only), HOXB13 (c.251G>A, p.Gly84Glu), HRAS, KIT, MAX, MEN1, MET, MITF (c.952G>A, p.Glu318Lys variant only), MLH1, MSH2, MSH3, MSH6, MUTYH, NBN, NF1, NF2, NTHL1, PALB2, PDGFRA, PHOX2B, PMS2, POLD1, POLE, POT1, PRKAR1A, PTCH1, PTEN, RAD50, RAD51C, RAD51D, RB1, RECQL4, RET, RNF43, RUNX1, SDHAF2,  SDHA (sequence changes only), SDHB, SDHC, SDHD, SMAD4, SMARCA4, SMARCB1, SMARCE1, STK11, SUFU, TERC, TERT, TMEM127, TP53, TSC1, TSC2, VHL, WRN and WT1.    12/09/2019 Miscellaneous   Mammaprint luminal type A, low risk  10% risk of recurrence in 10 years with 97.8% benefor of hormaonal therapy alone.    01/12/2020 - 03/25/2020 Chemotherapy   Verzenio started on 01/12/2020, dose reduced to 5073mm and 100m22m11/2022 due to poor tolerance. Held Verzenio on 03/25/20 to proceed with breast surgery.    03/01/2020 Mammogram   Targeted ultrasound is performed, showing interval decreasing conspicuity of the patient's known right breast cancer. A post biopsy clip with an associated irregular area shadowing is demonstrated at the 11 o'clock position 4 cm from the nipple. Exact measurements are difficult due to the vague appearance of the findings. It measures approximately 1.2 x 0.9 x 0.6 cm (previously 4.7 x 2.2 x 1.5 cm).   IMPRESSION: Imaging findings consistent with good response to chemotherapy.   03/16/2020 Imaging   MRI Breast  IMPRESSION: 1. Interval resolution of RIGHT axillary adenopathy. 2. Slightly smaller area of non mass enhancement in the UPPER-OUTER QUADRANT of the RIGHT breast.   04/15/2020 Surgery   RIGHT BREAST LUMPECTOMY WITH RADIOACTIVE SEED LOCALIZATION and RADIOACTIVE SEED GUIDED RIGHT AXILLARY SENTINEL LYMPH NODE DISSECTION and SENTINEL NODE BIOPSY by Dr TsueGeorgette Dover3/31/2022 Pathology Results   FINAL MICROSCOPIC DIAGNOSIS:   A. 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  - Invasive carcinoma broadly involves each the anterior, posterior,  superior and inferior margins.  - DCIS involves the inferior margin and is < 1 mm from each the anterior  and superior margins.  - Atypical lobular  hyperplasia.  - Biopsy sites.  - See oncology table.   B. TARGETED LYMPH NODE, RIGHT AXILLA, BIOPSY:  - Metastatic carcinoma in (1) of (1) lymph node.  - Biopsy site.   C. SENTINEL LYMPH NODE, RIGHT AXILLA #1, BIOPSY:  - Metastatic carcinoma in (1) of (1) lymph node.   D. SENTINEL LYMPH NODE, RIGHT AXILLA #2, BIOPSY:  - One lymph node, negative for carcinoma (0/1).    04/15/2020 Cancer Staging   Staging form: Breast, AJCC 8th Edition - Pathologic stage from 04/15/2020: No Stage Recommended (ypT2, pN2a, cM0, G2, ER+, PR+, HER2-) - Signed by Truitt Merle, MD on 05/27/2020 Stage prefix: Post-therapy Histologic grading system: 3 grade system Residual tumor (R): R1 - Microscopic   05/18/2020 Surgery   Right Mastectomy  A. BREAST, RIGHT, MASTECTOMY:  - Residual invasive carcinoma with mixed ductal and lobular features.       Residual invasive carcinoma broadly involves the anterior inferior  soft tissue margin.       Residual invasive carcinoma is focally < 1 mm from the deep margin.  - Residual ductal carcinoma in situ.       Residual DCIS is 3 mm from the closest margin, deep.  - Residual atypical lobular hyperplasia.  - Prior procedure site changes.  - Biopsy clip (X2).   B. BREAST IMPLANT, RIGHT, EXPLANTATION:  - Implant (gross only).   C. LYMPH NODES, RIGHT AXILLA, DISSECTION:  - Metastatic carcinoma in (2) of (12) lymph nodes.  - Prior procedure site changes in surrounding soft tissue.    06/24/2020 -  Chemotherapy    Patient is on Treatment Plan: BREAST ADJUVANT DOSE DENSE AC Q14D / PACLITAXEL Q7D          CURRENT THERAPY:  Adjuvant Taxol, q7d, starting 07/08/20  INTERVAL HISTORY:  Caitlyn Miller is here for a follow up of breast cancer. She was last seen by me on 08/19/20. She presents to the clinic alone. She reports joint pain, fatigue, tightness in her fingers, palpitations (her pulse is high today). She notes she wakes up in the middle of the night but is able to get  back to sleep.   All other systems were reviewed with the patient and are negative.  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   Pelvic pain 09/2008    SURGICAL HISTORY: Past Surgical History:  Procedure Laterality Date   AUGMENTATION MAMMAPLASTY Bilateral 4536   silicone    BREAST IMPLANT REMOVAL Right 05/18/2020   Procedure: REMOVAL OF RIGHT BREAST IMPLANT;  Surgeon: Donnie Mesa, MD;  Location: [facility];  Service: General;  Laterality: Right;   BREAST LUMPECTOMY WITH RADIOACTIVE SEED LOCALIZATION Right 04/15/2020   Procedure: RIGHT BREAST LUMPECTOMY WITH RADIOACTIVE SEED LOCALIZATION;  Surgeon: Donnie Mesa, MD;  Location: [facility];  Service: General;  Laterality: Right;   MODIFIED MASTECTOMY Right 05/18/2020   Procedure: RIGHT MODIFIED RADICAL MASTECTOMY;  Surgeon: Donnie Mesa, MD;  Location: [facility];  Service: General;  Laterality: Right;   PORTACATH PLACEMENT Left 06/10/2020   Procedure: INSERTION PORT-A-CATH;  Surgeon: Donnie Mesa, MD;  Location: [facility];  Service: General;  Laterality: Left;  45 MINUTES ROOM 2   RADIOACTIVE SEED GUIDED AXILLARY SENTINEL LYMPH NODE Right 04/15/2020   Procedure: RADIOACTIVE SEED GUIDED RIGHT AXILLARY SENTINEL LYMPH NODE DISSECTION;  Surgeon: Donnie Mesa, MD;  Location: [facility];  Service: General;  Laterality: Right;   SENTINEL NODE BIOPSY N/A  04/15/2020   Procedure: SENTINEL NODE BIOPSY;  Surgeon: Donnie Mesa, MD;  Location: [facility];  Service: General;  Laterality: N/A;   WISDOM TOOTH EXTRACTION      I have reviewed the social history and family history with the patient and they are unchanged from previous note.  ALLERGIES:  is allergic to ibuprofen.  MEDICATIONS:  Current Outpatient Medications  Medication Sig Dispense Refill   acetaminophen (TYLENOL) 500 MG tablet Take 500 mg by mouth every 6 (six) hours as needed for moderate pain.     clindamycin (CLINDAGEL) 1 % gel Apply topically 2 (two) times daily. 30 g 0   gabapentin (NEURONTIN) 100 MG capsule TAKE 2 CAPSULES(200 MG) BY MOUTH AT BEDTIME 60 capsule 1   lidocaine (LIDODERM) 5 % Place 1 patch onto the skin daily. Remove & Discard patch within 12 hours or as directed by MD 30 patch 0   lidocaine-prilocaine (EMLA) cream Apply to affected area once (Patient taking differently: Apply 1 application topically daily as needed (port access).) 30 g 3   magnesium oxide (MAG-OX) 400 (240 Mg) MG tablet Take 1 tablet (400 mg total) by mouth daily. (Patient not taking: Reported on 07/22/2020) 20 tablet 0   ondansetron (ZOFRAN) 8 MG tablet Take 1 tablet (8 mg total) by mouth every 8 (eight) hours as needed. Start on the third day after chemotherapy. 30 tablet 2   pantoprazole (PROTONIX) 40 MG tablet Take 1 tablet (40 mg total) by mouth daily. 30 tablet 3   prochlorperazine (COMPAZINE) 10 MG tablet TAKE 1 TABLET(10 MG) BY MOUTH EVERY 6 HOURS AS NEEDED FOR NAUSEA OR VOMITING 30 tablet 3   traMADol (ULTRAM) 50 MG tablet Take 1 tablet (50 mg total) by mouth every 6 (six) hours as needed (mild pain). (Patient not taking: Reported on 07/22/2020) 30 tablet 0   venlafaxine (EFFEXOR) 37.5 MG tablet Take 1 tablet (37.5 mg total) by mouth daily. 30 tablet 30   No current facility-administered medications for this visit.   Facility-Administered Medications Ordered in Other Visits   Medication Dose Route Frequency Provider Last Rate Last Admin   diphenhydrAMINE (BENADRYL) 50 MG/ML injection            famotidine (PEPCID) 20 mg IVPB             PHYSICAL EXAMINATION: ECOG PERFORMANCE STATUS: 2 - Symptomatic, <50% confined to bed  Vitals:   09/02/20 1342  BP: 95/73  Pulse: (!) 120  Temp: 97.7 F (36.5 C)  SpO2: 99%   Wt Readings from Last 3 Encounters:  09/02/20 131 lb 6.4 oz (59.6 kg)  08/19/20 129 lb 6.4 oz (58.7 kg)  08/12/20 129 lb 12.8 oz (58.9 kg)     GENERAL:alert, no distress and comfortable SKIN: skin color normal, no rashes or significant  lesions EYES: normal, Conjunctiva are pink and non-injected, sclera clear  NEURO: alert & oriented x 3 with fluent speech  LABORATORY DATA:  I have reviewed the data as listed CBC Latest Ref Rng & Units 09/02/2020 08/26/2020 08/19/2020  WBC 4.0 - 10.5 K/uL 5.6 5.5 4.1  Hemoglobin 12.0 - 15.0 g/dL 11.0(L) 11.3(L) 10.8(L)  Hematocrit 36.0 - 46.0 % 32.2(L) 33.6(L) 31.8(L)  Platelets 150 - 400 K/uL 333 341 280     CMP Latest Ref Rng & Units 09/02/2020 08/26/2020 08/19/2020  Glucose 70 - 99 mg/dL 129(H) 117(H) 157(H)  BUN 6 - 20 mg/dL 14 11 11   Creatinine 0.44 - 1.00 mg/dL 0.73 0.73 0.71  Sodium 135 - 145 mmol/L 140 141 141  Potassium 3.5 - 5.1 mmol/L 3.7 3.9 3.5  Chloride 98 - 111 mmol/L 105 105 105  CO2 22 - 32 mmol/L 25 26 26   Calcium 8.9 - 10.3 mg/dL 9.7 9.9 9.7  Total Protein 6.5 - 8.1 g/dL 7.5 7.6 7.2  Total Bilirubin 0.3 - 1.2 mg/dL 0.5 0.6 0.5  Alkaline Phos 38 - 126 U/L 67 81 87  AST 15 - 41 U/L 38 59(H) 41  ALT 0 - 44 U/L 69(H) 148(H) 117(H)      RADIOGRAPHIC STUDIES: I have personally reviewed the radiological images as listed and agreed with the findings in the report. No results found.   ASSESSMENT & PLAN:  Caitlyn Miller is a 35 y.o. female with   1. Malignant neoplasm of upper-outer quadrant of right breast, Stage IIA, p(T2N1aM0), ER+/PR+/HER2-, Grade II, Mammaprint luminal type A, low  risk -She has a 4.7cm right breast mass at 11:00 position and two abnormal right axillary LN. 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 -Her 11/2019 breast MRI and PET scan did not indicate left breast or distant malignancy or metastasis.  -Mammaprint showed low risk disease and neoadjuvant chemotherapy is not recommended.  -She started neoadjuvant tamoxifen on 12/01/19. She tolerated poorly with nausea, and diffuse body aches. Dose reduced to 26m from 12/09/19 then switched to anastrozole on 01/06/20. I started her on monthly Zoladex injection on 12/09/19. -I started her on Verzenio on 01/12/2020, dose reduced to 511mam and 10090m/11/2020 due to poor tolerance. Held Verzenio on 03/25/20 to proceed with breast surgery. -Given her 03/01/20 [place] mammogram indicated good response to treatment, she proceeded with right lumpectomy and sentinel LN dissection on 04/15/20 with Dr TsuGeorgette Doverurgical pathology showed larger than expected tumor at 3.1cm of invasive carcinoma with mixed ductal and lobular features and DCIS components. She also had positive margins and 2/3 positive LNs. -She underwent mastectomy and axillary node dissection on 05/18/20 under Dr. TsuGeorgette Doverowing: residual invasive carcinoma and DCIS. She again had a positive margin and another 2/12 positive nodes. She had stage III disease.  -Due to her N2 disease, I recommend adjuvant chemotherapy to reduce her risk of recurrence, followed by postmastectomy radiation to reduce her risk of local recurrence. -She had first cycle AC on 06/24/20 and tolerated very poorly with a variety of symptoms causing 2 ED visits. -We moved to weekly Taxol on 07/08/20. She is tolerating it better -Lab reviewed, liver function continues to improve.  We will continue weekly Taxol, today is week 8. -f/u in 2 weeks    2. 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 -she continues to have  some neuropathy in her fingers and toes. She is on gabapentin BID at night (did not tolerate higher dose)   3.  Transaminitis -Developed after chemo Taxol, likely secondary to chemotherapy -Ultrasound of liver 07/29/20 was unremarkable -She knows to avoid Tylenol, she does not drink alcohol -Her liver functions today are pending. -Continue monitoring closely, will continue Taxol if AST and ALT less than 500, with normal bilirubin   4. Digestive issues, reflux -She is on pantoprazole. Will continue -She again asked about supplements and if they would be okay to take. I will consult with our pharmacist and let her know.   5. Generalized Weakness, body pain, Fatigue, hot flushes  -Pt notes for the past 3+ years she has had mid chest and mid back pain. Previously intermittent, now constant. She also notes b/l arm and leg weakness and pain -This has lead to fatigue, low appetite, decreased daily function/activity. -Pt notes prior workup for this in [place] was negative with Neurologist and cardiologist. They said this is related to stress, anxiety or depression. Pt feels she has an illness causing this instead. -Her 11/25/19 PET was negative for any distant malignancy or metastasis.  -This has returned to baseline since stopping AC treatment. -will increase her Effexor for her worsening hot flashes   6. Anxiety, depression -She was previously on Lexapro.  I switched her to Effexor, she is tolerating better  -I previously discussed increasing Effexor to twice daily. Given her worsening hot flashes, today she is willing to try.   7. 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   -She has discussed with our genetic consoler    8. Social Support -She is from [place] and lives both there and in [place] with  her husband. She has 2 teenage children. The rest of her family is in [place].  -She notes her mother is living with her now. Her mother is able to help her, especially with her kids. -She continues Zoladex injection q4weeks. She wishes to continue to preserve her ovarian function.     PLAN:  -Proceed with C8 taxol and weekly x4 more  -Zoladex in 2 and 6 weeks -lab and f/u in 2 weeks    No problem-specific Assessment & Plan notes found for this encounter.   No orders of the defined types were placed in this encounter.  All questions were answered. The patient knows to call the clinic with any problems, questions or concerns. No barriers to learning was detected.      Truitt Merle, MD 09/02/2020   I, Wilburn Mylar, am acting as scribe for Truitt Merle, MD.   I have reviewed the above documentation for accuracy and completeness, and I agree with the above.

## 2020-09-02 NOTE — Patient Instructions (Signed)
[facility]  Discharge Instructions: Thank you for choosing [facility] to provide your oncology and hematology care.   If you have a lab appointment with the [facility], please go directly to the [facility] and check in at the registration area.   Wear comfortable clothing and clothing appropriate for easy access to any Portacath or PICC line.   We strive to give you quality time with your provider. You may need to reschedule your appointment if you arrive late (15 or more minutes).  Arriving late affects you and other patients whose appointments are after yours.  Also, if you miss three or more appointments without notifying the office, you may be dismissed from the clinic at the provider's discretion.      For prescription refill requests, have your pharmacy contact our office and allow 72 hours for refills to be completed.    Today you received the following chemotherapy and/or immunotherapy agents Paclitaxel (Taxol).      To help prevent nausea and vomiting after your treatment, we encourage you to take your nausea medication as directed.  BELOW ARE SYMPTOMS THAT SHOULD BE REPORTED IMMEDIATELY: *FEVER GREATER THAN 100.4 F (38 C) OR HIGHER *CHILLS OR SWEATING *NAUSEA AND VOMITING THAT IS NOT CONTROLLED WITH YOUR NAUSEA MEDICATION *UNUSUAL SHORTNESS OF BREATH *UNUSUAL BRUISING OR BLEEDING *URINARY PROBLEMS (pain or burning when urinating, or frequent urination) *BOWEL PROBLEMS (unusual diarrhea, constipation, pain near the anus) TENDERNESS IN MOUTH AND THROAT WITH OR WITHOUT PRESENCE OF ULCERS (sore throat, sores in mouth, or a toothache) UNUSUAL RASH, SWELLING OR PAIN  UNUSUAL VAGINAL DISCHARGE OR ITCHING   Items with * indicate a potential emergency and should be followed up as soon as possible or go to the Emergency Department if any problems should occur.  Please show the CHEMOTHERAPY ALERT CARD or IMMUNOTHERAPY ALERT CARD at  check-in to the Emergency Department and triage nurse.  Should you have questions after your visit or need to cancel or reschedule your appointment, please contact [facility]  Dept: [phone]  and follow the prompts.  Office hours are 8:00 a.m. to 4:30 p.m. Monday - Friday. Please note that voicemails left after 4:00 p.m. may not be returned until the following business day.  We are closed weekends and major holidays. You have access to a nurse at all times for urgent questions. Please call the main number to the clinic Dept: [phone] and follow the prompts.   For any non-urgent questions, you may also contact your provider using MyChart. We now offer e-Visits for anyone 37 and older to request care online for non-urgent symptoms. For details visit [email or web address].   Also download the MyChart app! Go to the app store, search "MyChart", open the app, select [facility], and log in with your MyChart username and password.  Due to Covid, a mask is required upon entering the hospital/clinic. If you do not have a mask, one will be given to you upon arrival. For doctor visits, patients may have 1 support person aged 26 or older with them. For treatment visits, patients cannot have anyone with them due to current Covid guidelines and our immunocompromised population.

## 2020-09-03 ENCOUNTER — Telehealth: Payer: Self-pay | Admitting: Hematology

## 2020-09-03 NOTE — Telephone Encounter (Signed)
Scheduled follow-up appointments per 8/18 los. Patient's husband is aware.

## 2020-09-09 ENCOUNTER — Other Ambulatory Visit: Payer: Self-pay

## 2020-09-09 ENCOUNTER — Inpatient Hospital Stay: Payer: 59 | Admitting: Nurse Practitioner

## 2020-09-09 ENCOUNTER — Inpatient Hospital Stay: Payer: 59

## 2020-09-09 ENCOUNTER — Encounter: Payer: Self-pay | Admitting: Hematology

## 2020-09-09 VITALS — BP 113/76 | HR 87 | Temp 98.4°F | Resp 18 | Wt 131.5 lb

## 2020-09-09 DIAGNOSIS — C50411 Malignant neoplasm of upper-outer quadrant of right female breast: Secondary | ICD-10-CM

## 2020-09-09 DIAGNOSIS — Z5111 Encounter for antineoplastic chemotherapy: Secondary | ICD-10-CM | POA: Diagnosis not present

## 2020-09-09 DIAGNOSIS — Z95828 Presence of other vascular implants and grafts: Secondary | ICD-10-CM

## 2020-09-09 DIAGNOSIS — Z170 Estrogen receptor positive status [ER+]: Secondary | ICD-10-CM

## 2020-09-09 LAB — CBC WITH DIFFERENTIAL (CANCER CENTER ONLY)
Abs Immature Granulocytes: 0.09 10*3/uL — ABNORMAL HIGH (ref 0.00–0.07)
Basophils Absolute: 0 10*3/uL (ref 0.0–0.1)
Basophils Relative: 1 %
Eosinophils Absolute: 0 10*3/uL (ref 0.0–0.5)
Eosinophils Relative: 1 %
HCT: 31 % — ABNORMAL LOW (ref 36.0–46.0)
Hemoglobin: 10.7 g/dL — ABNORMAL LOW (ref 12.0–15.0)
Immature Granulocytes: 2 %
Lymphocytes Relative: 40 %
Lymphs Abs: 1.8 10*3/uL (ref 0.7–4.0)
MCH: 32.4 pg (ref 26.0–34.0)
MCHC: 34.5 g/dL (ref 30.0–36.0)
MCV: 93.9 fL (ref 80.0–100.0)
Monocytes Absolute: 0.3 10*3/uL (ref 0.1–1.0)
Monocytes Relative: 7 %
Neutro Abs: 2.3 10*3/uL (ref 1.7–7.7)
Neutrophils Relative %: 49 %
Platelet Count: 311 10*3/uL (ref 150–400)
RBC: 3.3 MIL/uL — ABNORMAL LOW (ref 3.87–5.11)
RDW: 14.5 % (ref 11.5–15.5)
WBC Count: 4.6 10*3/uL (ref 4.0–10.5)
nRBC: 0 % (ref 0.0–0.2)

## 2020-09-09 LAB — CMP (CANCER CENTER ONLY)
ALT: 63 U/L — ABNORMAL HIGH (ref 0–44)
AST: 40 U/L (ref 15–41)
Albumin: 4 g/dL (ref 3.5–5.0)
Alkaline Phosphatase: 58 U/L (ref 38–126)
Anion gap: 9 (ref 5–15)
BUN: 12 mg/dL (ref 6–20)
CO2: 24 mmol/L (ref 22–32)
Calcium: 9.5 mg/dL (ref 8.9–10.3)
Chloride: 107 mmol/L (ref 98–111)
Creatinine: 0.69 mg/dL (ref 0.44–1.00)
GFR, Estimated: >60 mL/min (ref >60)
Glucose, Bld: 90 mg/dL (ref 70–99)
Potassium: 4.2 mmol/L (ref 3.5–5.1)
Sodium: 140 mmol/L (ref 135–145)
Total Bilirubin: 0.4 mg/dL (ref 0.3–1.2)
Total Protein: 7.1 g/dL (ref 6.5–8.1)

## 2020-09-09 MED ORDER — SODIUM CHLORIDE 0.9 % IV SOLN
80.0000 mg/m2 | Freq: Once | INTRAVENOUS | Status: AC
  Administered 2020-09-09: 132 mg via INTRAVENOUS
  Filled 2020-09-09: qty 22

## 2020-09-09 MED ORDER — HEPARIN SOD (PORK) LOCK FLUSH 100 UNIT/ML IV SOLN
500.0000 [IU] | Freq: Once | INTRAVENOUS | Status: AC | PRN
  Administered 2020-09-09: 500 [IU]

## 2020-09-09 MED ORDER — FAMOTIDINE 20 MG IN NS 100 ML IVPB
20.0000 mg | Freq: Once | INTRAVENOUS | Status: AC
  Administered 2020-09-09: 20 mg via INTRAVENOUS
  Filled 2020-09-09: qty 100

## 2020-09-09 MED ORDER — DIPHENHYDRAMINE HCL 50 MG/ML IJ SOLN
25.0000 mg | Freq: Once | INTRAMUSCULAR | Status: AC
  Administered 2020-09-09: 25 mg via INTRAVENOUS
  Filled 2020-09-09: qty 1

## 2020-09-09 MED ORDER — SODIUM CHLORIDE 0.9% FLUSH
10.0000 mL | INTRAVENOUS | Status: DC | PRN
Start: 2020-09-09 — End: 2020-09-09
  Administered 2020-09-09: 10 mL

## 2020-09-09 MED ORDER — SODIUM CHLORIDE 0.9 % IV SOLN
10.0000 mg | Freq: Once | INTRAVENOUS | Status: AC
  Administered 2020-09-09: 10 mg via INTRAVENOUS
  Filled 2020-09-09: qty 10

## 2020-09-09 MED ORDER — SODIUM CHLORIDE 0.9% FLUSH
10.0000 mL | INTRAVENOUS | Status: AC | PRN
  Administered 2020-09-09: 10 mL

## 2020-09-09 MED ORDER — SODIUM CHLORIDE 0.9 % IV SOLN: Freq: Once | INTRAVENOUS | Status: AC

## 2020-09-09 NOTE — Telephone Encounter (Signed)
Chart Review

## 2020-09-09 NOTE — Patient Instructions (Signed)
[facility]  Discharge Instructions: Thank you for choosing [facility] to provide your oncology and hematology care.   If you have a lab appointment with the Cancer Center, please go directly to the Cancer Center and check in at the registration area.   Wear comfortable clothing and clothing appropriate for easy access to any Portacath or PICC line.   We strive to give you quality time with your provider. You may need to reschedule your appointment if you arrive late (15 or more minutes).  Arriving late affects you and other patients whose appointments are after yours.  Also, if you miss three or more appointments without notifying the office, you may be dismissed from the clinic at the provider's discretion.      For prescription refill requests, have your pharmacy contact our office and allow 72 hours for refills to be completed.    Today you received the following chemotherapy and/or immunotherapy agents taxol      To help prevent nausea and vomiting after your treatment, we encourage you to take your nausea medication as directed.  BELOW ARE SYMPTOMS THAT SHOULD BE REPORTED IMMEDIATELY: *FEVER GREATER THAN 100.4 F (38 C) OR HIGHER *CHILLS OR SWEATING *NAUSEA AND VOMITING THAT IS NOT CONTROLLED WITH YOUR NAUSEA MEDICATION *UNUSUAL SHORTNESS OF BREATH *UNUSUAL BRUISING OR BLEEDING *URINARY PROBLEMS (pain or burning when urinating, or frequent urination) *BOWEL PROBLEMS (unusual diarrhea, constipation, pain near the anus) TENDERNESS IN MOUTH AND THROAT WITH OR WITHOUT PRESENCE OF ULCERS (sore throat, sores in mouth, or a toothache) UNUSUAL RASH, SWELLING OR PAIN  UNUSUAL VAGINAL DISCHARGE OR ITCHING   Items with * indicate a potential emergency and should be followed up as soon as possible or go to the Emergency Department if any problems should occur.  Please show the CHEMOTHERAPY ALERT CARD or IMMUNOTHERAPY ALERT CARD at check-in to the  Emergency Department and triage nurse.  Should you have questions after your visit or need to cancel or reschedule your appointment, please contact [facility]  Dept: [phone]  and follow the prompts.  Office hours are 8:00 a.m. to 4:30 p.m. Monday - Friday. Please note that voicemails left after 4:00 p.m. may not be returned until the following business day.  We are closed weekends and major holidays. You have access to a nurse at all times for urgent questions. Please call the main number to the clinic Dept: [phone] and follow the prompts.   For any non-urgent questions, you may also contact your provider using MyChart. We now offer e-Visits for anyone 18 and older to request care online for non-urgent symptoms. For details visit [email or web address].   Also download the MyChart app! Go to the app store, search "MyChart", open the app, select [facility], and log in with your MyChart username and password.  Due to Covid, a mask is required upon entering the hospital/clinic. If you do not have a mask, one will be given to you upon arrival. For doctor visits, patients may have 1 support person aged 18 or older with them. For treatment visits, patients cannot have anyone with them due to current Covid guidelines and our immunocompromised population.   

## 2020-09-15 ENCOUNTER — Ambulatory Visit: Payer: 59 | Admitting: Physical Therapy

## 2020-09-15 ENCOUNTER — Encounter: Payer: Self-pay | Admitting: Physical Therapy

## 2020-09-15 ENCOUNTER — Other Ambulatory Visit: Payer: Self-pay

## 2020-09-15 DIAGNOSIS — M25611 Stiffness of right shoulder, not elsewhere classified: Secondary | ICD-10-CM | POA: Diagnosis not present

## 2020-09-15 DIAGNOSIS — R293 Abnormal posture: Secondary | ICD-10-CM

## 2020-09-15 DIAGNOSIS — Z170 Estrogen receptor positive status [ER+]: Secondary | ICD-10-CM

## 2020-09-15 DIAGNOSIS — Z483 Aftercare following surgery for neoplasm: Secondary | ICD-10-CM

## 2020-09-15 NOTE — Therapy (Signed)
[place], [place], [address] Phone: [phone]   Fax:  [phone]  Physical Therapy Treatment  Patient Details  Name: Caitlyn Miller MRN: [id number] Date of Birth: Apr 06, 1985 Referring Provider (PT): Dr. Rodman Key Tsuei/Feng   Encounter Date: 09/15/2020   PT End of Session - 09/15/20 1053     Visit Number 7    Number of Visits 12    Date for PT Re-Evaluation 11/24/20    PT Start Time 1006    PT Stop Time 2244    PT Time Calculation (min) 46 min    Activity Tolerance Patient tolerated treatment well    Behavior During Therapy [facility] for tasks assessed/performed             Past Medical History:  Diagnosis Date   Abnormal Pap smear    Acid reflux    Anemia    Anxiety    Breast cancer ([place])    Decreased appetite 10/08   Depression    Endometriosis 10/2004   Epigastric pain 10/2006   Family history of breast cancer 11/19/2019   FH: migraines    GBS carrier    H/O amenorrhea 06/2006   H/O dyspareunia 7/07   H/O fatigue    H/O nausea and vomiting 10/2006   H/O rubella    H/O varicella    Hyperemesis arising during pregnancy    First pregnancy   Irregular periods/menstrual cycles 09/7528   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   Pelvic pain 09/2008    Past Surgical History:  Procedure Laterality Date   AUGMENTATION MAMMAPLASTY Bilateral 1117   silicone    BREAST IMPLANT REMOVAL Right 05/18/2020   Procedure: REMOVAL OF RIGHT BREAST IMPLANT;  Surgeon: Donnie Mesa, MD;  Location: [facility];  Service: General;  Laterality: Right;   BREAST LUMPECTOMY WITH RADIOACTIVE SEED LOCALIZATION Right 04/15/2020   Procedure: RIGHT BREAST LUMPECTOMY WITH RADIOACTIVE SEED LOCALIZATION;  Surgeon: Donnie Mesa, MD;  Location: [facility];  Service: General;  Laterality: Right;   MODIFIED MASTECTOMY Right 05/18/2020   Procedure: RIGHT MODIFIED RADICAL MASTECTOMY;  Surgeon: Donnie Mesa,  MD;  Location: [facility];  Service: General;  Laterality: Right;   PORTACATH PLACEMENT Left 06/10/2020   Procedure: INSERTION PORT-A-CATH;  Surgeon: Donnie Mesa, MD;  Location: [facility];  Service: General;  Laterality: Left;  45 MINUTES ROOM 2   RADIOACTIVE SEED GUIDED AXILLARY SENTINEL LYMPH NODE Right 04/15/2020   Procedure: RADIOACTIVE SEED GUIDED RIGHT AXILLARY SENTINEL LYMPH NODE DISSECTION;  Surgeon: Donnie Mesa, MD;  Location: [facility];  Service: General;  Laterality: Right;   SENTINEL NODE BIOPSY N/A 04/15/2020   Procedure: SENTINEL NODE BIOPSY;  Surgeon: Donnie Mesa, MD;  Location: [facility];  Service: General;  Laterality: N/A;   WISDOM TOOTH EXTRACTION      There were no vitals filed for this visit.   Subjective Assessment - 09/15/20 1009     Subjective The past two mornings when I am still in bed i have gotten a sharp pain in the back of the arm. I have been trying to rub the area with the tennis ball against the wall. It seems to help some. My ROM is still good. I am so fatigued.I fell in the supermarket. I felt dizzy as well.    Pertinent History Patient was diagnosed on 11/05/2019 with right grade II invasive ductal carcinoma breast cancer. It measures 4.7 cm and is located in the upper  outer quadrant. It is ER/PR positive and HER2 negative with a Ki67 of 5%. She has a biopsied positive axillary lymph node. She had a Right lumpectomy with SLNB (2/3+) performed on 04/15/2020,  right modified mastectomy ALND (2/12), currently doing chemo and will need radiation    Patient Stated Goals Get back to normal use of right UE    Currently in Pain? No/denies   pt reports she is very fatigued   Pain Score 0-No pain                               [facility] Adult PT Treatment/Exercise - 09/15/20 0001       Manual Therapy   Soft tissue mobilization with cocoa butter in left sidelying to R lateral trunk including  infraspinatus, serratus, lats, rhomboids, paraspinals and teres with numerous areas of tightness noted that improved with soft tissue mobilization                         PT Long Term Goals - 09/01/20 1007       PT LONG TERM GOAL #1   Title Patient will demonstrate she has regained near  full shoulder ROM and function post operatively compared to baselines.    Baseline End ROM improved but still limited by cording- 05/13/20; 07/15/20- pt limited at end range by tightness though ROM has improved; 09/01/20- pt is no longer limited at end range    Time 6    Period Weeks    Status Achieved      PT LONG TERM GOAL #2   Title Pt will have decreased pain by atleast 50%    Baseline Pt reports no pain at this time as motion improves-05/13/20    Time 3    Period Weeks    Status Achieved      PT LONG TERM GOAL #3   Title Pt will have quick dash no greater than 20% for return to function prior to next surgery    Baseline 70%; pt did not retake this today but reports no longer feeling limited with reaching and ADLs-05/13/20; 09/01/20- 13.64    Time 3    Period Weeks    Status Achieved      PT LONG TERM GOAL #4   Title pt will be able to dress, bathe and perform home activities with improved ease    Time 3    Period Weeks    Status Achieved      PT LONG TERM GOAL #5   Title Pt will be independent in a home exercise program for continued strengthening and stretching so she can avoid increased tightness with radiation    Time 6    Period Weeks    Status On-going                   Plan - 09/15/20 1053     Clinical Impression Statement Pt's muscle tightness returned over the last two days and she has had sharp pains in her scapular area going towards her arm. She has been doing the massage on the wall using the tennis ball for her lats and serratus and feels that it has been helping. Today she has several trigger points in her infraspinatus muscle that released with  treatment today. Will assess tightness at next session and if the pain and tightness in this area is managed then will instruct pt in Strength  ABC program.    PT Frequency Biweekly    PT Duration 12 weeks    PT Treatment/Interventions ADLs/Self Care Home Management;Therapeutic exercise;Patient/family education;Manual techniques;Manual lymph drainage;Scar mobilization;Passive range of motion;Orthotic Fit/Training    PT Next Visit Plan see how R infraspinatus feels after last session,and instructing in an HEP for R shoulder ROM and strength, instruct in Strength ABC - pt to begin radiation in sept    PT Home Exercise Plan Post op shoulder ROM HEP (supine for flex and stargazer), supine dowel flex and abd    Consulted and Agree with Plan of Care Patient             Patient will benefit from skilled therapeutic intervention in order to improve the following deficits and impairments:  Postural dysfunction, Decreased range of motion, Impaired UE functional use, Pain, Decreased knowledge of precautions, Decreased activity tolerance, Impaired sensation, Decreased strength, Increased fascial restricitons  Visit Diagnosis: Aftercare following surgery for neoplasm  Stiffness of right shoulder, not elsewhere classified  Abnormal posture  Malignant neoplasm of upper-outer quadrant of right breast in female, estrogen receptor positive ([place])     Problem List Patient Active Problem List   Diagnosis Date Noted   Menopausal syndrome (hot flushes) 86/38/1771   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

## 2020-09-16 ENCOUNTER — Encounter: Payer: Self-pay | Admitting: *Deleted

## 2020-09-16 ENCOUNTER — Inpatient Hospital Stay: Payer: 59

## 2020-09-16 ENCOUNTER — Other Ambulatory Visit: Payer: Self-pay

## 2020-09-16 ENCOUNTER — Encounter: Payer: Self-pay | Admitting: Hematology

## 2020-09-16 ENCOUNTER — Inpatient Hospital Stay: Payer: 59 | Attending: Hematology | Admitting: Hematology

## 2020-09-16 VITALS — BP 119/81 | HR 95 | Temp 98.6°F | Resp 17 | Ht 64.0 in | Wt 134.0 lb

## 2020-09-16 DIAGNOSIS — G620 Drug-induced polyneuropathy: Secondary | ICD-10-CM | POA: Insufficient documentation

## 2020-09-16 DIAGNOSIS — C773 Secondary and unspecified malignant neoplasm of axilla and upper limb lymph nodes: Secondary | ICD-10-CM | POA: Insufficient documentation

## 2020-09-16 DIAGNOSIS — Z170 Estrogen receptor positive status [ER+]: Secondary | ICD-10-CM | POA: Diagnosis not present

## 2020-09-16 DIAGNOSIS — C50411 Malignant neoplasm of upper-outer quadrant of right female breast: Secondary | ICD-10-CM | POA: Diagnosis present

## 2020-09-16 DIAGNOSIS — Z5111 Encounter for antineoplastic chemotherapy: Secondary | ICD-10-CM | POA: Insufficient documentation

## 2020-09-16 DIAGNOSIS — L989 Disorder of the skin and subcutaneous tissue, unspecified: Secondary | ICD-10-CM | POA: Insufficient documentation

## 2020-09-16 DIAGNOSIS — F419 Anxiety disorder, unspecified: Secondary | ICD-10-CM | POA: Diagnosis not present

## 2020-09-16 DIAGNOSIS — R7401 Elevation of levels of liver transaminase levels: Secondary | ICD-10-CM | POA: Insufficient documentation

## 2020-09-16 DIAGNOSIS — R5383 Other fatigue: Secondary | ICD-10-CM | POA: Diagnosis not present

## 2020-09-16 DIAGNOSIS — Z95828 Presence of other vascular implants and grafts: Secondary | ICD-10-CM

## 2020-09-16 LAB — CBC WITH DIFFERENTIAL (CANCER CENTER ONLY)
Abs Immature Granulocytes: 0.11 10*3/uL — ABNORMAL HIGH (ref 0.00–0.07)
Basophils Absolute: 0.1 10*3/uL (ref 0.0–0.1)
Basophils Relative: 1 %
Eosinophils Absolute: 0.1 10*3/uL (ref 0.0–0.5)
Eosinophils Relative: 1 %
HCT: 30.7 % — ABNORMAL LOW (ref 36.0–46.0)
Hemoglobin: 10.5 g/dL — ABNORMAL LOW (ref 12.0–15.0)
Immature Granulocytes: 2 %
Lymphocytes Relative: 38 %
Lymphs Abs: 2.1 10*3/uL (ref 0.7–4.0)
MCH: 31.9 pg (ref 26.0–34.0)
MCHC: 34.2 g/dL (ref 30.0–36.0)
MCV: 93.3 fL (ref 80.0–100.0)
Monocytes Absolute: 0.3 10*3/uL (ref 0.1–1.0)
Monocytes Relative: 5 %
Neutro Abs: 3 10*3/uL (ref 1.7–7.7)
Neutrophils Relative %: 53 %
Platelet Count: 320 10*3/uL (ref 150–400)
RBC: 3.29 MIL/uL — ABNORMAL LOW (ref 3.87–5.11)
RDW: 14.6 % (ref 11.5–15.5)
WBC Count: 5.6 10*3/uL (ref 4.0–10.5)
nRBC: 0 % (ref 0.0–0.2)

## 2020-09-16 LAB — CMP (CANCER CENTER ONLY)
ALT: 63 U/L — ABNORMAL HIGH (ref 0–44)
AST: 37 U/L (ref 15–41)
Albumin: 4.2 g/dL (ref 3.5–5.0)
Alkaline Phosphatase: 58 U/L (ref 38–126)
Anion gap: 10 (ref 5–15)
BUN: 10 mg/dL (ref 6–20)
CO2: 26 mmol/L (ref 22–32)
Calcium: 9.7 mg/dL (ref 8.9–10.3)
Chloride: 106 mmol/L (ref 98–111)
Creatinine: 0.66 mg/dL (ref 0.44–1.00)
GFR, Estimated: >60 mL/min (ref >60)
Glucose, Bld: 120 mg/dL — ABNORMAL HIGH (ref 70–99)
Potassium: 3.9 mmol/L (ref 3.5–5.1)
Sodium: 142 mmol/L (ref 135–145)
Total Bilirubin: 0.4 mg/dL (ref 0.3–1.2)
Total Protein: 7.2 g/dL (ref 6.5–8.1)

## 2020-09-16 MED ORDER — SODIUM CHLORIDE 0.9% FLUSH
10.0000 mL | INTRAVENOUS | Status: AC | PRN
  Administered 2020-09-16: 10 mL

## 2020-09-16 MED ORDER — DIPHENHYDRAMINE HCL 50 MG/ML IJ SOLN
25.0000 mg | Freq: Once | INTRAMUSCULAR | Status: AC
  Administered 2020-09-16: 25 mg via INTRAVENOUS
  Filled 2020-09-16: qty 1

## 2020-09-16 MED ORDER — SODIUM CHLORIDE 0.9% FLUSH
10.0000 mL | INTRAVENOUS | Status: DC | PRN
  Administered 2020-09-16: 10 mL

## 2020-09-16 MED ORDER — PACLITAXEL CHEMO INJECTION 300 MG/50ML
80.0000 mg/m2 | Freq: Once | INTRAVENOUS | Status: AC
  Administered 2020-09-16: 132 mg via INTRAVENOUS
  Filled 2020-09-16: qty 22

## 2020-09-16 MED ORDER — HEPARIN SOD (PORK) LOCK FLUSH 100 UNIT/ML IV SOLN
500.0000 [IU] | Freq: Once | INTRAVENOUS | Status: AC | PRN
  Administered 2020-09-16: 500 [IU]

## 2020-09-16 MED ORDER — SODIUM CHLORIDE 0.9 % IV SOLN
10.0000 mg | Freq: Once | INTRAVENOUS | Status: AC
  Administered 2020-09-16: 10 mg via INTRAVENOUS
  Filled 2020-09-16: qty 10

## 2020-09-16 MED ORDER — GOSERELIN ACETATE 3.6 MG ~~LOC~~ IMPL
3.6000 mg | DRUG_IMPLANT | Freq: Once | SUBCUTANEOUS | Status: AC
  Administered 2020-09-16: 3.6 mg via SUBCUTANEOUS

## 2020-09-16 MED ORDER — SODIUM CHLORIDE 0.9 % IV SOLN: Freq: Once | INTRAVENOUS | Status: AC

## 2020-09-16 MED ORDER — FAMOTIDINE 20 MG IN NS 100 ML IVPB
20.0000 mg | Freq: Once | INTRAVENOUS | Status: AC
  Administered 2020-09-16: 20 mg via INTRAVENOUS
  Filled 2020-09-16: qty 100

## 2020-09-16 NOTE — Progress Notes (Signed)
[place]   [phone] [phone]   Clinic Follow up Note   Patient Care Team: [name], Lilia Argue, MD as PCP - General (Family Medicine) Mansouraty, Telford Nab., MD as Consulting Physician (Gastroenterology) Mauro Kaufmann, RN as Oncology Nurse Navigator Rockwell Germany, RN as Oncology Nurse Navigator Donnie Mesa, MD as Consulting Physician (General Surgery) Truitt Merle, MD as Consulting Physician (Hematology) Kyung Rudd, MD as Consulting Physician (Radiation Oncology) Contogiannis, Audrea Muscat, MD as Consulting Physician (Plastic Surgery)  Date of Service:  09/16/2020  CHIEF COMPLAINT: f/u of right breast cancer  CURRENT THERAPY:  Adjuvant Taxol, q7d, starting 07/08/20  ASSESSMENT & PLAN:  Caitlyn Miller is a 35 y.o. female with   1. Malignant neoplasm of upper-outer quadrant of right breast, Stage IIA, p(T2N1aM0), ER+/PR+/HER2-, Grade II, Mammaprint luminal type A, low risk -She has a 4.7cm right breast mass at 11:00 position and two abnormal right axillary LN. 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 -Her 11/2019 breast MRI and PET scan did not indicate left breast or distant malignancy or metastasis.  -Mammaprint showed low risk disease and neoadjuvant chemotherapy is not recommended.  -She started neoadjuvant tamoxifen on 12/01/19. She tolerated poorly with nausea, and diffuse body aches. Dose reduced to $RemoveBe'10mg'UoEuRXLOf$  from 12/09/19 then switched to anastrozole on 01/06/20. I started her on monthly Zoladex injection on 12/09/19. -I started her on Verzenio on 01/12/2020, dose reduced to $RemoveBe'50mg'ATfWPmcQr$  am and $Remo'100mg'bKDPz$  01/27/2020 due to poor tolerance. Held Verzenio on 03/25/20 to proceed with breast surgery. -Given her 03/01/20 [place] and mammogram indicated good response to treatment, she proceeded with right lumpectomy and sentinel LN dissection on 04/15/20 with Dr Georgette Dover.  Surgical pathology showed larger than expected tumor at 3.1cm of invasive carcinoma with mixed ductal and lobular features and DCIS components. She also had positive margins and 2/3 positive LNs. -She underwent mastectomy and axillary node dissection on 05/18/20 showing: residual invasive carcinoma and DCIS. She again had a positive margin and another 2/12 positive nodes. She had stage III disease.  -chest CT 06/17/20 and CT AP 07/02/20 were negative. -Due to her N2 disease, I recommend adjuvant chemotherapy to reduce her risk of recurrence, followed by postmastectomy radiation to reduce her risk of local recurrence. -She had first cycle AC on 06/24/20 and tolerated very poorly with a variety of symptoms causing 2 ED visits. -We moved to weekly Taxol on 07/08/20. She is tolerating it better -Lab reviewed, liver function continues to improve.  We will continue weekly Taxol, today is week 10. -f/u in 2 weeks with final taxol.  2. Skin lesion -she reports a birthmark to her scalp that she noticed has been changing and recently bleeding. -she requests a referral to dermatology. I will reach out for her.   3. 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 She is on gabapentin BID at night (did not tolerate higher dose)   4. Transaminitis; Digestive issues, reflux -Developed after chemo Taxol, likely secondary to chemotherapy -Ultrasound of liver 07/29/20 was unremarkable -She knows to avoid Tylenol, she does not drink alcohol -Her most recent LFT from 09/09/20 were improved. -Continue monitoring closely, will continue Taxol if AST and ALT less than 500, with normal bilirubin  -She is on pantoprazole. Will continue   5. Generalized Weakness, body  pain, Fatigue, hot flushes  -Pt notes for the past 3+ years she has had mid chest and mid back pain. Previously intermittent, now  constant. She also notes b/l arm and leg weakness and pain -This has lead to fatigue, low appetite, decreased daily function/activity. -Pt notes prior workup for this in [place] was negative with Neurologist and cardiologist. They said this is related to stress, anxiety or depression. Pt feels she has an illness causing this instead. -Her 11/25/19 PET was negative for any distant malignancy or metastasis.  -This has returned to baseline since stopping AC treatment. -will increase her Effexor for her worsening hot flashes   6. Anxiety, depression -She was previously on Lexapro.  I switched her to Effexor, she is tolerating better  -I previously discussed increasing Effexor to twice daily.    7. 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   -She has discussed with our genetic consoler    8. Social Support -She is from [place] and lives both there and in [place] with her husband. She has 2 teenage children. The rest of her family is in [place].  -She notes her mother is living with her now. Her mother is able to help her, especially with her kids. -She continues Zoladex injection q4weeks. She wishes to continue to preserve her ovarian function.     PLAN:  -Proceed with C10 taxol and weekly x2 more  -Zoladex today and in 4 weeks -lab and f/u next week   No problem-specific Assessment & Plan notes found for this encounter.   SUMMARY OF ONCOLOGIC HISTORY: Oncology History Overview Note  Cancer Staging Malignant neoplasm of upper-outer quadrant of right breast in female, estrogen receptor positive ([place]) Staging form: Breast, AJCC 8th Edition - Clinical stage from 11/12/2019: Stage IIA (cT2, cN1, cM0, G2, ER+, PR+, HER2-) - Signed by Truitt Merle, MD on 11/19/2019 Stage prefix: Initial diagnosis Histologic grading system: 3 grade system - Pathologic stage from 04/15/2020: No Stage  Recommended (ypT2, pN2a, cM0, G2, ER+, PR+, HER2-) - Signed by Truitt Merle, MD on 05/27/2020 Stage prefix: Post-therapy Histologic grading system: 3 grade system Residual tumor (R): R1 - Microscopic    Malignant neoplasm of upper-outer quadrant of right breast in female, estrogen receptor positive ([place])  11/05/2019 Mammogram   IMPRESSION: Large irregular palpable mass 4.7 x 1.4 x 1.4 cm within the upper-outer right breast 11 o'clock position, 4 cm from nipple.  There is a large area of associated coarse heterogeneous calcifications within the mass. Overall findings are concerning for breast carcinoma.   Two cortically thickened right axillary lymph nodes which are indeterminate in etiology.   11/12/2019 Cancer Staging   Staging form: Breast, AJCC 8th Edition - Clinical stage from 11/12/2019: Stage IIA (cT2, cN1, cM0, G2, ER+, PR+, HER2-) - Signed by Truitt Merle, MD on 11/19/2019   11/12/2019 Initial Biopsy   Diagnosis 1. Breast, right, needle core biopsy, right - INVASIVE MAMMARY CARCINOMA, SEE COMMENT. - MAMMARY CARCINOMA IN SITU. 2. Lymph node, needle/core biopsy, right - METASTATIC MAMMARY CARCINOMA. Microscopic Comment 1. The carcinoma appears grade 2 and measures 16 mm in greatest linear extent. E-cadherin will be ordered. Prognostic makers will be ordered. Dr. Saralyn Pilar has reviewed the case. The case was called to [facility] on 01/13/2020.    1. E-cadherin is strongly positive consistent with a ductal phenotype.   11/12/2019 Receptors her2   1. PROGNOSTIC INDICATORS Results: IMMUNOHISTOCHEMICAL  AND MORPHOMETRIC ANALYSIS PERFORMED MANUALLY The tumor cells are NEGATIVE for Her2 (0). Estrogen Receptor: 100%, POSITIVE, STRONG STAINING INTENSITY Progesterone Receptor: 100%, POSITIVE, STRONG STAINING INTENSITY Proliferation Marker Ki67: 5%   11/17/2019 Initial Diagnosis   Malignant neoplasm of upper-outer quadrant of right breast in female, estrogen  receptor positive ([place])   11/25/2019 Breast MRI   IMPRESSION: 1. Large area of non-mass enhancement involving the UPPER OUTER QUADRANT of the RIGHT breast measuring approximately 4.2 x 4.1 x 2.4 cm. The biopsy-proven IDC and DCIS is present at the superomedial aspect of this NME. 2. No MRI evidence of malignancy involving the LEFT breast. 3. Intact BILATERAL retropectoral implants. 4. 2 pathologically enlarged RIGHT axillary lymph nodes. One of these nodes is biopsy-proven metastatic disease. No pathologic lymphadenopathy elsewhere   11/25/2019 PET scan   IMPRESSION: 1. Two mildly enlarged hypermetabolic right axillary lymph nodes compatible with right axillary nodal metastases. 2. No additional sites of hypermetabolic metastatic disease. 3. Asymmetric indistinct upper right breast hypermetabolism, without discrete mass correlate on the CT images, compatible with known primary right breast malignancy.     11/26/2019 Genetic Testing   Negative genetic testing: no pathogenic variants detected in [facility] STAT Breast Cancer Panel.  Variant of uncertain significance (VUS) detected in CHEK2 at c.1556G>T (p.Arg519Leu).  The report date is November 26, 2019.   The STAT Breast cancer panel offered by [facility] includes sequencing and rearrangement analysis for the following 9 genes:  ATM, BRCA1, BRCA2, CDH1, CHEK2, PALB2, PTEN, STK11 and TP53.    Results of [facility] Multi-Cancer Panel are pending.    12/01/2019 -  Anti-estrogen oral therapy   Neoadjuvant Tamoxifen 20mg  on 12/01/19. Reduced to 10mg  on 12/09/19.       ---Zoladex injection monthly starting 12/09/19.       ---switched Tamoxifen to anastrozole on 01/06/20   12/08/2019 Genetic Testing   Positive genetic testing: pathogenic variant detected in RAD51D c.694C>T (p.Arg232*) through [facility] Multi-Cancer Panel.  Variants of uncertain significance detected RAD51D at c.715C>T (p.Arg239Trp) and CHEK2 at c.1556G>T (p.Arg519Leu).  The report  date is December 08, 2019.   The Multi-Cancer Panel offered by [facility] includes sequencing and/or deletion duplication testing of the following 85 genes: AIP, ALK, APC, ATM, AXIN2,BAP1,  BARD1, BLM, BMPR1A, BRCA1, BRCA2, BRIP1, CASR, CDC73, CDH1, CDK4, CDKN1B, CDKN1C, CDKN2A (p14ARF), CDKN2A (p16INK4a), CEBPA, CHEK2, CTNNA1, DICER1, DIS3L2, EGFR (c.2369C>T, p.Thr790Met variant only), EPCAM (Deletion/duplication testing only), FH, FLCN, GATA2, GPC3, GREM1 (Promoter region deletion/duplication testing only), HOXB13 (c.251G>A, p.Gly84Glu), HRAS, KIT, MAX, MEN1, MET, MITF (c.952G>A, p.Glu318Lys variant only), MLH1, MSH2, MSH3, MSH6, MUTYH, NBN, NF1, NF2, NTHL1, PALB2, PDGFRA, PHOX2B, PMS2, POLD1, POLE, POT1, PRKAR1A, PTCH1, PTEN, RAD50, RAD51C, RAD51D, RB1, RECQL4, RET, RNF43, RUNX1, SDHAF2, SDHA (sequence changes only), SDHB, SDHC, SDHD, SMAD4, SMARCA4, SMARCB1, SMARCE1, STK11, SUFU, TERC, TERT, TMEM127, TP53, TSC1, TSC2, VHL, WRN and WT1.    12/09/2019 Miscellaneous   Mammaprint luminal type A, low risk  10% risk of recurrence in 10 years with 97.8% benefor of hormaonal therapy alone.    01/12/2020 - 03/25/2020 Chemotherapy   Verzenio started on 01/12/2020, dose reduced to 50mg  am and 100mg  01/27/2020 due to poor tolerance. Held Verzenio on 03/25/20 to proceed with breast surgery.    03/01/2020 Mammogram   Targeted ultrasound is performed, showing interval decreasing conspicuity of the patient's known right breast cancer. A post biopsy clip with an associated irregular area shadowing is demonstrated at the 11 o'clock position 4 cm from the nipple. Exact measurements are difficult due to the  vague appearance of the findings. It measures approximately 1.2 x 0.9 x 0.6 cm (previously 4.7 x 2.2 x 1.5 cm).   IMPRESSION: Imaging findings consistent with good response to chemotherapy.   03/16/2020 Imaging   MRI Breast  IMPRESSION: 1. Interval resolution of RIGHT axillary adenopathy. 2. Slightly smaller  area of non mass enhancement in the UPPER-OUTER QUADRANT of the RIGHT breast.   04/15/2020 Surgery   RIGHT BREAST LUMPECTOMY WITH RADIOACTIVE SEED LOCALIZATION and RADIOACTIVE SEED GUIDED RIGHT AXILLARY SENTINEL LYMPH NODE DISSECTION and SENTINEL NODE BIOPSY by Dr Georgette Dover    04/15/2020 Pathology Results   FINAL MICROSCOPIC DIAGNOSIS:   A. 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  - Invasive carcinoma broadly involves each the anterior, posterior,  superior and inferior margins.  - DCIS involves the inferior margin and is < 1 mm from each the anterior  and superior margins.  - Atypical lobular hyperplasia.  - Biopsy sites.  - See oncology table.   B. TARGETED LYMPH NODE, RIGHT AXILLA, BIOPSY:  - Metastatic carcinoma in (1) of (1) lymph node.  - Biopsy site.   C. SENTINEL LYMPH NODE, RIGHT AXILLA #1, BIOPSY:  - Metastatic carcinoma in (1) of (1) lymph node.   D. SENTINEL LYMPH NODE, RIGHT AXILLA #2, BIOPSY:  - One lymph node, negative for carcinoma (0/1).    04/15/2020 Cancer Staging   Staging form: Breast, AJCC 8th Edition - Pathologic stage from 04/15/2020: No Stage Recommended (ypT2, pN2a, cM0, G2, ER+, PR+, HER2-) - Signed by Truitt Merle, MD on 05/27/2020 Stage prefix: Post-therapy Histologic grading system: 3 grade system Residual tumor (R): R1 - Microscopic   05/18/2020 Surgery   Right Mastectomy  A. BREAST, RIGHT, MASTECTOMY:  - Residual invasive carcinoma with mixed ductal and lobular features.       Residual invasive carcinoma broadly involves the anterior inferior  soft tissue margin.       Residual invasive carcinoma is focally < 1 mm from the deep margin.  - Residual ductal carcinoma in situ.       Residual DCIS is 3 mm from the closest margin, deep.  - Residual atypical lobular hyperplasia.   - Prior procedure site changes.  - Biopsy clip (X2).   B. BREAST IMPLANT, RIGHT, EXPLANTATION:  - Implant (gross only).   C. LYMPH NODES, RIGHT AXILLA, DISSECTION:  - Metastatic carcinoma in (2) of (12) lymph nodes.  - Prior procedure site changes in surrounding soft tissue.    06/24/2020 -  Chemotherapy    Patient is on Treatment Plan: BREAST ADJUVANT DOSE DENSE AC Q14D / PACLITAXEL Q7D          INTERVAL HISTORY:  Caitlyn Miller is here for a follow up of breast cancer. She was last seen by me on 09/02/20. She presents to the clinic alone. She reports she fell since her last visit. She denies losing consciousness and endorses drinking well. She notes her legs just gave out while she was in the grocery store. She notes that was the only time. She reports she has no taste but continues to try to eat well. She notes her neuropathy is intermittent and stable. She reports continued body pains, for which she takes tylenol. She also reports continued chest/heart pain. She notes it does go away with light exercise.  She reports a birthmark on  her scalp, which she feel is changing, and it is now bleeding per her report.    All other systems were reviewed with the patient and are negative.  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   Pelvic pain 09/2008    SURGICAL HISTORY: Past Surgical History:  Procedure Laterality Date   AUGMENTATION  MAMMAPLASTY Bilateral 9983   silicone    BREAST IMPLANT REMOVAL Right 05/18/2020   Procedure: REMOVAL OF RIGHT BREAST IMPLANT;  Surgeon: Donnie Mesa, MD;  Location: [place];  Service: General;  Laterality: Right;   BREAST LUMPECTOMY WITH RADIOACTIVE SEED LOCALIZATION Right 04/15/2020   Procedure: RIGHT BREAST LUMPECTOMY WITH RADIOACTIVE SEED LOCALIZATION;  Surgeon: Donnie Mesa, MD;  Location: [facility];  Service: General;  Laterality: Right;   MODIFIED MASTECTOMY Right 05/18/2020   Procedure: RIGHT MODIFIED RADICAL MASTECTOMY;  Surgeon: Donnie Mesa, MD;  Location: [place];  Service: General;  Laterality: Right;   PORTACATH PLACEMENT Left 06/10/2020   Procedure: INSERTION PORT-A-CATH;  Surgeon: Donnie Mesa, MD;  Location: [place];  Service: General;  Laterality: Left;  45 MINUTES ROOM 2   RADIOACTIVE SEED GUIDED AXILLARY SENTINEL LYMPH NODE Right 04/15/2020   Procedure: RADIOACTIVE SEED GUIDED RIGHT AXILLARY SENTINEL LYMPH NODE DISSECTION;  Surgeon: Donnie Mesa, MD;  Location: [place] [place];  Service: General;  Laterality: Right;   SENTINEL NODE BIOPSY N/A 04/15/2020   Procedure: SENTINEL NODE BIOPSY;  Surgeon: Donnie Mesa, MD;  Location: [facility] [place];  Service: General;  Laterality: N/A;   WISDOM TOOTH EXTRACTION      I have reviewed the social history and family history with the patient and they are unchanged from previous note.  ALLERGIES:  is allergic to ibuprofen.  MEDICATIONS:  Current Outpatient Medications  Medication Sig Dispense Refill   acetaminophen (TYLENOL) 500 MG tablet Take 500 mg by mouth every 6 (six) hours as needed for moderate pain.     clindamycin (CLINDAGEL) 1 % gel Apply topically 2 (two) times daily. 30 g 0   gabapentin (NEURONTIN) 100 MG capsule TAKE 2 CAPSULES(200 MG) BY MOUTH AT BEDTIME 60 capsule 1   lidocaine (LIDODERM) 5 % Place 1 patch onto the skin daily. Remove & Discard patch within 12 hours  or as directed by MD 30 patch 0   lidocaine-prilocaine (EMLA) cream Apply to affected area once (Patient taking differently: Apply 1 application topically daily as needed (port access).) 30 g 3   magnesium oxide (MAG-OX) 400 (240 Mg) MG tablet Take 1 tablet (400 mg total) by mouth daily. (Patient not taking: Reported on 07/22/2020) 20 tablet 0   ondansetron (ZOFRAN) 8 MG tablet Take 1 tablet (8 mg total) by mouth every 8 (eight) hours as needed. Start on the third day after chemotherapy. 30 tablet 2  pantoprazole (PROTONIX) 40 MG tablet Take 1 tablet (40 mg total) by mouth daily. 30 tablet 3   prochlorperazine (COMPAZINE) 10 MG tablet TAKE 1 TABLET(10 MG) BY MOUTH EVERY 6 HOURS AS NEEDED FOR NAUSEA OR VOMITING 30 tablet 3   traMADol (ULTRAM) 50 MG tablet Take 1 tablet (50 mg total) by mouth every 6 (six) hours as needed (mild pain). (Patient not taking: Reported on 07/22/2020) 30 tablet 0   venlafaxine (EFFEXOR) 37.5 MG tablet Take 1 tablet (37.5 mg total) by mouth daily. 30 tablet 30   No current facility-administered medications for this visit.   Facility-Administered Medications Ordered in Other Visits  Medication Dose Route Frequency Provider Last Rate Last Admin   sodium chloride flush (NS) 0.9 % injection 10 mL  10 mL Intracatheter PRN Truitt Merle, MD   10 mL at 09/16/20 1716    PHYSICAL EXAMINATION: ECOG PERFORMANCE STATUS: 2 - Symptomatic, <50% confined to bed  Vitals:   09/16/20 1304  BP: 119/81  Pulse: 95  Resp: 17  Temp: 98.6 F (37 C)  SpO2: 96%   Wt Readings from Last 3 Encounters:  09/16/20 134 lb (60.8 kg)  09/09/20 131 lb 8 oz (59.6 kg)  09/02/20 131 lb 6.4 oz (59.6 kg)     GENERAL:alert, no distress and comfortable SKIN: skin color normal, no rashes, (+) lesion to scalp EYES: normal, Conjunctiva are pink and non-injected, sclera clear  NEURO: alert & oriented x 3 with fluent speech  LABORATORY DATA:  I have reviewed the data as listed CBC Latest Ref Rng & Units  09/16/2020 09/09/2020 09/02/2020  WBC 4.0 - 10.5 K/uL 5.6 4.6 5.6  Hemoglobin 12.0 - 15.0 g/dL 10.5(L) 10.7(L) 11.0(L)  Hematocrit 36.0 - 46.0 % 30.7(L) 31.0(L) 32.2(L)  Platelets 150 - 400 K/uL 320 311 333     CMP Latest Ref Rng & Units 09/16/2020 09/09/2020 09/02/2020  Glucose 70 - 99 mg/dL 120(H) 90 129(H)  BUN 6 - 20 mg/dL _0 Creatinine 0.44 - 1.00 mg/dL 0.66 0.69 0.73  Sodium 135 - 145 mmol/L 142 140 140  Potassium 3.5 - 5.1 mmol/L 3.9 4.2 3.7  Chloride 98 - 111 mmol/L 106 107 105  CO2 22 - 32 mmol/L _1 Calcium 8.9 - 10.3 mg/dL 9.7 9.5 9.7  Total Protein 6.5 - 8.1 g/dL 7.2 7.1 7.5  Total Bilirubin 0.3 - 1.2 mg/dL 0.4 0.4 0.5  Alkaline Phos 38 - 126 U/L 58 58 67  AST 15 - 41 U/L 37 40 38  ALT 0 - 44 U/L 63(H) 63(H) 69(H)      RADIOGRAPHIC STUDIES: I have personally reviewed the radiological images as listed and agreed with the findings in the report. No results found.    Orders Placed This Encounter  Procedures   Ambulatory referral to Dermatology    Referral Priority:   Elective    Referral Type:   Consultation    Referral Reason:   Specialty Services Required    Requested Specialty:   Dermatology    Number of Visits Requested:   1   All questions were answered. The patient knows to call the clinic with any problems, questions or concerns. No barriers to learning was detected. The total time spent in the appointment was 30 minutes.     Truitt Merle, MD 09/16/2020   I, Wilburn Mylar, am acting as scribe for Truitt Merle, MD.   I have reviewed the above documentation for accuracy and completeness, and I agree  with the above.     

## 2020-09-23 MED FILL — Dexamethasone Sodium Phosphate Inj 100 MG/10ML: INTRAMUSCULAR | Qty: 1 | Status: AC

## 2020-09-24 ENCOUNTER — Inpatient Hospital Stay (HOSPITAL_BASED_OUTPATIENT_CLINIC_OR_DEPARTMENT_OTHER): Payer: 59 | Admitting: Hematology

## 2020-09-24 ENCOUNTER — Encounter: Payer: Self-pay | Admitting: Hematology

## 2020-09-24 ENCOUNTER — Inpatient Hospital Stay: Payer: 59

## 2020-09-24 ENCOUNTER — Other Ambulatory Visit: Payer: Self-pay

## 2020-09-24 VITALS — BP 102/71 | HR 88 | Temp 98.2°F | Resp 16 | Wt 133.8 lb

## 2020-09-24 DIAGNOSIS — C50411 Malignant neoplasm of upper-outer quadrant of right female breast: Secondary | ICD-10-CM | POA: Diagnosis not present

## 2020-09-24 DIAGNOSIS — Z170 Estrogen receptor positive status [ER+]: Secondary | ICD-10-CM

## 2020-09-24 DIAGNOSIS — Z95828 Presence of other vascular implants and grafts: Secondary | ICD-10-CM | POA: Insufficient documentation

## 2020-09-24 DIAGNOSIS — Z5111 Encounter for antineoplastic chemotherapy: Secondary | ICD-10-CM | POA: Diagnosis not present

## 2020-09-24 LAB — CMP (CANCER CENTER ONLY)
ALT: 87 U/L — ABNORMAL HIGH (ref 0–44)
AST: 48 U/L — ABNORMAL HIGH (ref 15–41)
Albumin: 4 g/dL (ref 3.5–5.0)
Alkaline Phosphatase: 62 U/L (ref 38–126)
Anion gap: 9 (ref 5–15)
BUN: 13 mg/dL (ref 6–20)
CO2: 26 mmol/L (ref 22–32)
Calcium: 9.6 mg/dL (ref 8.9–10.3)
Chloride: 106 mmol/L (ref 98–111)
Creatinine: 0.73 mg/dL (ref 0.44–1.00)
GFR, Estimated: >60 mL/min (ref >60)
Glucose, Bld: 135 mg/dL — ABNORMAL HIGH (ref 70–99)
Potassium: 3.7 mmol/L (ref 3.5–5.1)
Sodium: 141 mmol/L (ref 135–145)
Total Bilirubin: 0.4 mg/dL (ref 0.3–1.2)
Total Protein: 7 g/dL (ref 6.5–8.1)

## 2020-09-24 LAB — CBC WITH DIFFERENTIAL (CANCER CENTER ONLY)
Abs Immature Granulocytes: 0.1 10*3/uL — ABNORMAL HIGH (ref 0.00–0.07)
Basophils Absolute: 0.1 10*3/uL (ref 0.0–0.1)
Basophils Relative: 1 %
Eosinophils Absolute: 0.1 10*3/uL (ref 0.0–0.5)
Eosinophils Relative: 2 %
HCT: 31.1 % — ABNORMAL LOW (ref 36.0–46.0)
Hemoglobin: 10.6 g/dL — ABNORMAL LOW (ref 12.0–15.0)
Immature Granulocytes: 2 %
Lymphocytes Relative: 36 %
Lymphs Abs: 1.9 10*3/uL (ref 0.7–4.0)
MCH: 32.4 pg (ref 26.0–34.0)
MCHC: 34.1 g/dL (ref 30.0–36.0)
MCV: 95.1 fL (ref 80.0–100.0)
Monocytes Absolute: 0.4 10*3/uL (ref 0.1–1.0)
Monocytes Relative: 8 %
Neutro Abs: 2.7 10*3/uL (ref 1.7–7.7)
Neutrophils Relative %: 51 %
Platelet Count: 341 10*3/uL (ref 150–400)
RBC: 3.27 MIL/uL — ABNORMAL LOW (ref 3.87–5.11)
RDW: 14.7 % (ref 11.5–15.5)
WBC Count: 5.2 10*3/uL (ref 4.0–10.5)
nRBC: 0 % (ref 0.0–0.2)

## 2020-09-24 MED ORDER — SODIUM CHLORIDE 0.9 % IV SOLN
80.0000 mg/m2 | Freq: Once | INTRAVENOUS | Status: AC
  Administered 2020-09-24: 132 mg via INTRAVENOUS
  Filled 2020-09-24: qty 22

## 2020-09-24 MED ORDER — HEPARIN SOD (PORK) LOCK FLUSH 100 UNIT/ML IV SOLN
500.0000 [IU] | Freq: Once | INTRAVENOUS | Status: AC | PRN
  Administered 2020-09-24: 500 [IU]

## 2020-09-24 MED ORDER — FAMOTIDINE 20 MG IN NS 100 ML IVPB
20.0000 mg | Freq: Once | INTRAVENOUS | Status: AC
  Administered 2020-09-24: 20 mg via INTRAVENOUS

## 2020-09-24 MED ORDER — SODIUM CHLORIDE 0.9% FLUSH
10.0000 mL | INTRAVENOUS | Status: DC | PRN
  Administered 2020-09-24: 10 mL

## 2020-09-24 MED ORDER — DIPHENHYDRAMINE HCL 50 MG/ML IJ SOLN
INTRAMUSCULAR | Status: AC
  Filled 2020-09-24: qty 1

## 2020-09-24 MED ORDER — SODIUM CHLORIDE 0.9 % IV SOLN: Freq: Once | INTRAVENOUS | Status: AC

## 2020-09-24 MED ORDER — DIPHENHYDRAMINE HCL 50 MG/ML IJ SOLN
25.0000 mg | Freq: Once | INTRAMUSCULAR | Status: AC
  Administered 2020-09-24: 25 mg via INTRAVENOUS

## 2020-09-24 MED ORDER — FAMOTIDINE 20 MG IN NS 100 ML IVPB
INTRAVENOUS | Status: AC
  Filled 2020-09-24: qty 100

## 2020-09-24 MED ORDER — SODIUM CHLORIDE 0.9 % IV SOLN
10.0000 mg | Freq: Once | INTRAVENOUS | Status: AC
  Administered 2020-09-24: 10 mg via INTRAVENOUS
  Filled 2020-09-24: qty 10

## 2020-09-24 MED ORDER — HYDROCORTISONE 1 % EX LOTN: 1.0000 "application " | TOPICAL_LOTION | Freq: Every day | CUTANEOUS | 0 refills | Status: AC | PRN

## 2020-09-24 MED ORDER — SODIUM CHLORIDE 0.9% FLUSH
10.0000 mL | Freq: Once | INTRAVENOUS | Status: AC
  Administered 2020-09-24: 10 mL

## 2020-09-24 NOTE — Progress Notes (Signed)
Per Dr. Burr Medico, "OK To Treat w/elevated ALT today of 87".

## 2020-09-24 NOTE — Progress Notes (Signed)
[place]   [phone] [phone]) [phone]   Clinic Follow up Note   Patient Care Team: [name], Lilia Argue, MD as PCP - General (Family Medicine) Mansouraty, Telford Nab., MD as Consulting Physician (Gastroenterology) Mauro Kaufmann, RN as Oncology Nurse Navigator Rockwell Germany, RN as Oncology Nurse Navigator Donnie Mesa, MD as Consulting Physician (General Surgery) Truitt Merle, MD as Consulting Physician (Hematology) Kyung Rudd, MD as Consulting Physician (Radiation Oncology) Contogiannis, Audrea Muscat, MD as Consulting Physician (Plastic Surgery)  Date of Service:  09/24/2020  CHIEF COMPLAINT: f/u of right breast cancer  CURRENT THERAPY:  Adjuvant Taxol, q7d, starting 07/08/20  ASSESSMENT & PLAN:  Caitlyn Miller is a 35 y.o. female with   1. Malignant neoplasm of upper-outer quadrant of right breast, Stage IIA, p(T2N1aM0), ER+/PR+/HER2-, Grade II, Mammaprint luminal type A, low risk -She has a 4.7cm right breast mass at 11:00 position and two abnormal right axillary LN. 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 -Her 11/2019 breast MRI and PET scan did not indicate left breast or distant malignancy or metastasis.  -Mammaprint showed low risk disease and neoadjuvant chemotherapy is not recommended.  -She started neoadjuvant tamoxifen on 12/01/19. She tolerated poorly with nausea, and diffuse body aches. Dose reduced to 63m from 12/09/19 then switched to anastrozole on 01/06/20. I started her on monthly Zoladex injection on 12/09/19. -I started her on Verzenio on 01/12/2020, dose reduced to 545mam and 10064m/11/2020 due to poor tolerance. Held Verzenio on 03/25/20 to proceed with breast surgery. -Given her 03/01/20 US [place] mammogram indicated good response to treatment, she proceeded with right lumpectomy and sentinel LN dissection on 04/15/20 with Dr TsuGeorgette DoverSurgical pathology showed larger than expected tumor at 3.1cm of invasive carcinoma with mixed ductal and lobular features and DCIS components. She also had positive margins and 2/3 positive LNs. -She underwent mastectomy and axillary node dissection on 05/18/20 showing: residual invasive carcinoma and DCIS. She again had a positive margin and another 2/12 positive nodes. She had stage III disease.  -chest CT 06/17/20 and CT AP 07/02/20 were negative. -Due to her N2 disease, I recommend adjuvant chemotherapy to reduce her risk of recurrence, followed by postmastectomy radiation to reduce her risk of local recurrence. -She had first cycle AC on 06/24/20 and tolerated very poorly with a variety of symptoms causing 2 ED visits. -We moved to weekly Taxol on 07/08/20. She is tolerating it better -Lab reviewed, liver function slightly worse than last week but still overall improved.  We will continue weekly Taxol, today is week 11. She has a facial rash from taxol, I will prescribe hydrocortisone for her today -I will refer her back to Dr. MooLisbeth Renshaw radiation oncology for discussion. -f/u in one week with her final taxol infusion   2. Skin lesion -she reports a birthmark to her scalp that she noticed has been changing and recently bleeding. -I previously referred her to dermatology. She has not heard from them yet. I will have my nurse reach out for her.   3. 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 She is on gabapentin BID at night (did not tolerate higher dose) -she feels this has worsened in the last week, but she is determined to finish treatment.   4. Transaminitis; Digestive issues,  reflux -Developed after chemo Taxol, likely secondary to chemotherapy -Ultrasound of liver 07/29/20 was unremarkable -She knows to avoid Tylenol, she does not drink alcohol -Her most  recent LFT from 09/09/20 were improved. -Continue monitoring closely, will continue Taxol if AST and ALT less than 500, with normal bilirubin  -She is on pantoprazole. Will continue   5. Generalized Weakness, body pain, Fatigue, hot flushes  -Pt notes for the past 3+ years she has had mid chest and mid back pain. Previously intermittent, now constant. She also notes b/l arm and leg weakness and pain -This has lead to fatigue, low appetite, decreased daily function/activity. -Pt notes prior workup for this in [place] was negative with Neurologist and cardiologist. They said this is related to stress, anxiety or depression. Pt feels she has an illness causing this instead. -Her 11/25/19 PET was negative for any distant malignancy or metastasis.  -This has returned to baseline since stopping AC treatment. -will increase her Effexor for her worsening hot flashes   6. Anxiety, depression -She was previously on Lexapro.  I switched her to Effexor, she is tolerating better  -I previously discussed increasing Effexor to twice daily.    7. 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   -She has discussed with our genetic consoler    8. Social Support -She is from [place] and lives both there and in [place] with her husband. She has 2 teenage children. The rest of her family is in [place].  -She notes her mother is living with her now. Her mother is able to help her, especially with her kids. -She continues Zoladex injection q4weeks. She wishes to continue to preserve her ovarian function.     PLAN:  -Proceed with C11 taxol today and in one week  -I called in hydrocortisone cream today -We will reach out regarding dermatology referral -referral to rad onc -Zoladex in 3 weeks -lab and f/u next week   No problem-specific Assessment & Plan notes found for this encounter.   SUMMARY OF  ONCOLOGIC HISTORY: Oncology History Overview Note  Cancer Staging Malignant neoplasm of upper-outer quadrant of right breast in female, estrogen receptor positive ([place]) Staging form: Breast, AJCC 8th Edition - Clinical stage from 11/12/2019: Stage IIA (cT2, cN1, cM0, G2, ER+, PR+, HER2-) - Signed by Truitt Merle, MD on 11/19/2019 Stage prefix: Initial diagnosis Histologic grading system: 3 grade system - Pathologic stage from 04/15/2020: No Stage Recommended (ypT2, pN2a, cM0, G2, ER+, PR+, HER2-) - Signed by Truitt Merle, MD on 05/27/2020 Stage prefix: Post-therapy Histologic grading system: 3 grade system Residual tumor (R): R1 - Microscopic    Malignant neoplasm of upper-outer quadrant of right breast in female, estrogen receptor positive ([place])  11/05/2019 Mammogram   IMPRESSION: Large irregular palpable mass 4.7 x 1.4 x 1.4 cm within the upper-outer right breast 11 o'clock position, 4 cm from nipple.  There is a large area of associated coarse heterogeneous calcifications within the mass. Overall findings are concerning for breast carcinoma.   Two cortically thickened right axillary lymph nodes which are indeterminate in etiology.   11/12/2019 Cancer Staging   Staging form: Breast, AJCC 8th Edition - Clinical stage from 11/12/2019: Stage IIA (cT2, cN1, cM0, G2, ER+, PR+, HER2-) - Signed by Truitt Merle, MD on 11/19/2019   11/12/2019 Initial Biopsy   Diagnosis 1. Breast, right, needle core biopsy, right - INVASIVE MAMMARY CARCINOMA, SEE COMMENT. - MAMMARY CARCINOMA IN SITU.  2. Lymph node, needle/core biopsy, right - METASTATIC MAMMARY CARCINOMA. Microscopic Comment 1. The carcinoma appears grade 2 and measures 16 mm in greatest linear extent. E-cadherin will be ordered. Prognostic makers will be ordered. Dr. Saralyn Pilar has reviewed the case. The case was called to [facility] on 01/13/2020.    1. E-cadherin is strongly positive consistent with a ductal phenotype.    11/12/2019 Receptors her2   1. PROGNOSTIC INDICATORS Results: IMMUNOHISTOCHEMICAL AND MORPHOMETRIC ANALYSIS PERFORMED MANUALLY The tumor cells are NEGATIVE for Her2 (0). Estrogen Receptor: 100%, POSITIVE, STRONG STAINING INTENSITY Progesterone Receptor: 100%, POSITIVE, STRONG STAINING INTENSITY Proliferation Marker Ki67: 5%   11/17/2019 Initial Diagnosis   Malignant neoplasm of upper-outer quadrant of right breast in female, estrogen receptor positive ([place])   11/25/2019 Breast MRI   IMPRESSION: 1. Large area of non-mass enhancement involving the UPPER OUTER QUADRANT of the RIGHT breast measuring approximately 4.2 x 4.1 x 2.4 cm. The biopsy-proven IDC and DCIS is present at the superomedial aspect of this NME. 2. No MRI evidence of malignancy involving the LEFT breast. 3. Intact BILATERAL retropectoral implants. 4. 2 pathologically enlarged RIGHT axillary lymph nodes. One of these nodes is biopsy-proven metastatic disease. No pathologic lymphadenopathy elsewhere   11/25/2019 PET scan   IMPRESSION: 1. Two mildly enlarged hypermetabolic right axillary lymph nodes compatible with right axillary nodal metastases. 2. No additional sites of hypermetabolic metastatic disease. 3. Asymmetric indistinct upper right breast hypermetabolism, without discrete mass correlate on the CT images, compatible with known primary right breast malignancy.     11/26/2019 Genetic Testing   Negative genetic testing: no pathogenic variants detected in [facility] STAT Breast Cancer Panel.  Variant of uncertain significance (VUS) detected in CHEK2 at c.1556G>T (p.Arg519Leu).  The report date is November 26, 2019.   The STAT Breast cancer panel offered by [facility] includes sequencing and rearrangement analysis for the following 9 genes:  ATM, BRCA1, BRCA2, CDH1, CHEK2, PALB2, PTEN, STK11 and TP53.    Results of [facility] Multi-Cancer Panel are pending.    12/01/2019 -  Anti-estrogen oral therapy   Neoadjuvant  Tamoxifen 78m on 12/01/19. Reduced to 15mon 12/09/19.       ---Zoladex injection monthly starting 12/09/19.       ---switched Tamoxifen to anastrozole on 01/06/20   12/08/2019 Genetic Testing   Positive genetic testing: pathogenic variant detected in RAD51D c.694C>T (p.Arg232*) through [facility].  Variants of uncertain significance detected RAD51D at c.715C>T (p.Arg239Trp) and CHEK2 at c.1556G>T (p.Arg519Leu).  The report date is December 08, 2019.   The Multi-Cancer Panel offered by [facility] includes sequencing and/or deletion duplication testing of the following 85 genes: AIP, ALK, APC, ATM, AXIN2,BAP1,  BARD1, BLM, BMPR1A, BRCA1, BRCA2, BRIP1, CASR, CDC73, CDH1, CDK4, CDKN1B, CDKN1C, CDKN2A (p14ARF), CDKN2A (p16INK4a), CEBPA, CHEK2, CTNNA1, DICER1, DIS3L2, EGFR (c.2369C>T, p.Thr790Met variant only), EPCAM (Deletion/duplication testing only), FH, FLCN, GATA2, GPC3, GREM1 (Promoter region deletion/duplication testing only), HOXB13 (c.251G>A, p.Gly84Glu), HRAS, KIT, MAX, MEN1, MET, MITF (c.952G>A, p.Glu318Lys variant only), MLH1, MSH2, MSH3, MSH6, MUTYH, NBN, NF1, NF2, NTHL1, PALB2, PDGFRA, PHOX2B, PMS2, POLD1, POLE, POT1, PRKAR1A, PTCH1, PTEN, RAD50, RAD51C, RAD51D, RB1, RECQL4, RET, RNF43, RUNX1, SDHAF2, SDHA (sequence changes only), SDHB, SDHC, SDHD, SMAD4, SMARCA4, SMARCB1, SMARCE1, STK11, SUFU, TERC, TERT, TMEM127, TP53, TSC1, TSC2, VHL, WRN and WT1.    12/09/2019 Miscellaneous   Mammaprint luminal type A, low risk  10% risk of recurrence in 10 years with 97.8% benefor of hormaonal therapy alone.    01/12/2020 - 03/25/2020 Chemotherapy  Verzenio started on 01/12/2020, dose reduced to 61m am and 1074m1/11/2020 due to poor tolerance. Held Verzenio on 03/25/20 to proceed with breast surgery.    03/01/2020 Mammogram   Targeted ultrasound is performed, showing interval decreasing conspicuity of the patient's known right breast cancer. A post biopsy clip with an associated  irregular area shadowing is demonstrated at the 11 o'clock position 4 cm from the nipple. Exact measurements are difficult due to the vague appearance of the findings. It measures approximately 1.2 x 0.9 x 0.6 cm (previously 4.7 x 2.2 x 1.5 cm).   IMPRESSION: Imaging findings consistent with good response to chemotherapy.   03/16/2020 Imaging   MRI Breast  IMPRESSION: 1. Interval resolution of RIGHT axillary adenopathy. 2. Slightly smaller area of non mass enhancement in the UPPER-OUTER QUADRANT of the RIGHT breast.   04/15/2020 Surgery   RIGHT BREAST LUMPECTOMY WITH RADIOACTIVE SEED LOCALIZATION and RADIOACTIVE SEED GUIDED RIGHT AXILLARY SENTINEL LYMPH NODE DISSECTION and SENTINEL NODE BIOPSY by Dr TsGeorgette Dover  04/15/2020 Pathology Results   FINAL MICROSCOPIC DIAGNOSIS:   A. 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  - Invasive carcinoma broadly involves each the anterior, posterior,  superior and inferior margins.  - DCIS involves the inferior margin and is < 1 mm from each the anterior  and superior margins.  - Atypical lobular hyperplasia.  - Biopsy sites.  - See oncology table.   B. TARGETED LYMPH NODE, RIGHT AXILLA, BIOPSY:  - Metastatic carcinoma in (1) of (1) lymph node.  - Biopsy site.   C. SENTINEL LYMPH NODE, RIGHT AXILLA #1, BIOPSY:  - Metastatic carcinoma in (1) of (1) lymph node.   D. SENTINEL LYMPH NODE, RIGHT AXILLA #2, BIOPSY:  - One lymph node, negative for carcinoma (0/1).    04/15/2020 Cancer Staging   Staging form: Breast, AJCC 8th Edition - Pathologic stage from 04/15/2020: No Stage Recommended (ypT2, pN2a, cM0, G2, ER+, PR+, HER2-) - Signed by FeTruitt MerleMD on 05/27/2020 Stage prefix: Post-therapy Histologic grading system: 3 grade system Residual tumor (R): R1 - Microscopic    05/18/2020 Surgery   Right Mastectomy  A. BREAST, RIGHT, MASTECTOMY:  - Residual invasive carcinoma with mixed ductal and lobular features.       Residual invasive carcinoma broadly involves the anterior inferior  soft tissue margin.       Residual invasive carcinoma is focally < 1 mm from the deep margin.  - Residual ductal carcinoma in situ.       Residual DCIS is 3 mm from the closest margin, deep.  - Residual atypical lobular hyperplasia.  - Prior procedure site changes.  - Biopsy clip (X2).   B. BREAST IMPLANT, RIGHT, EXPLANTATION:  - Implant (gross only).   C. LYMPH NODES, RIGHT AXILLA, DISSECTION:  - Metastatic carcinoma in (2) of (12) lymph nodes.  - Prior procedure site changes in surrounding soft tissue.    06/24/2020 -  Chemotherapy    Patient is on Treatment Plan: BREAST ADJUVANT DOSE DENSE AC Q14D / PACLITAXEL Q7D          INTERVAL HISTORY:  ImAnahla Beviss here for a follow up of breast cancer. She was last seen by me on 09/16/20. She was seen in the infusion area. She reports some worsening neuropathy to her fingers and the bottoms of  her feet. She notes it is noticeably worse than last week. She also reports continued fatigue. She is "sometimes" able to do things around the house. She reports some rash to her face. She notes she has not been contacted by dermatology yet.   All other systems were reviewed with the patient and are negative.  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   Pelvic pain 09/2008    SURGICAL HISTORY: Past Surgical History:  Procedure Laterality Date   AUGMENTATION MAMMAPLASTY Bilateral 9480   silicone    BREAST IMPLANT REMOVAL Right 05/18/2020   Procedure: REMOVAL OF RIGHT BREAST IMPLANT;  Surgeon: Donnie Mesa, MD;  Location: [facility];  Service: General;  Laterality: Right;   BREAST LUMPECTOMY WITH RADIOACTIVE SEED LOCALIZATION Right 04/15/2020   Procedure: RIGHT BREAST LUMPECTOMY WITH RADIOACTIVE SEED LOCALIZATION;  Surgeon: Donnie Mesa, MD;  Location: [facility];  Service: General;  Laterality: Right;   MODIFIED MASTECTOMY Right 05/18/2020   Procedure: RIGHT MODIFIED RADICAL MASTECTOMY;  Surgeon: Donnie Mesa, MD;  Location: [facility];  Service: General;  Laterality: Right;   PORTACATH PLACEMENT Left 06/10/2020   Procedure: INSERTION PORT-A-CATH;  Surgeon: Donnie Mesa, MD;  Location: [facility];  Service: General;  Laterality: Left;  45 MINUTES ROOM 2   RADIOACTIVE SEED GUIDED AXILLARY SENTINEL LYMPH NODE Right 04/15/2020   Procedure: RADIOACTIVE SEED GUIDED RIGHT AXILLARY SENTINEL LYMPH NODE DISSECTION;  Surgeon: Donnie Mesa, MD;  Location: [facility];  Service: General;  Laterality: Right;   SENTINEL NODE BIOPSY N/A 04/15/2020   Procedure: SENTINEL NODE BIOPSY;  Surgeon: Donnie Mesa, MD;  Location: [facility];  Service: General;  Laterality: N/A;   WISDOM TOOTH EXTRACTION      I have reviewed the social history and family history with the patient and they are unchanged from previous note.  ALLERGIES:  is allergic to ibuprofen.  MEDICATIONS:  Current Outpatient Medications  Medication Sig Dispense Refill   hydrocortisone 1 % lotion Apply 1 application topically daily as needed (rash). 118 mL 0   acetaminophen (TYLENOL) 500 MG tablet Take 500 mg by mouth every 6 (six) hours as needed for moderate  pain.     clindamycin (CLINDAGEL) 1 % gel Apply topically 2 (two) times daily. 30 g 0   gabapentin (NEURONTIN) 100 MG capsule TAKE 2 CAPSULES(200 MG) BY MOUTH AT BEDTIME 60 capsule 1   lidocaine (LIDODERM) 5 % Place 1 patch onto the skin daily. Remove & Discard patch within 12 hours or as directed by MD 30 patch 0   lidocaine-prilocaine (EMLA) cream Apply to affected area once (Patient taking differently: Apply 1 application topically daily as needed (port access).) 30 g 3   magnesium oxide (MAG-OX) 400 (240 Mg) MG tablet Take 1 tablet (400 mg  total) by mouth daily. (Patient not taking: Reported on 07/22/2020) 20 tablet 0   ondansetron (ZOFRAN) 8 MG tablet Take 1 tablet (8 mg total) by mouth every 8 (eight) hours as needed. Start on the third day after chemotherapy. 30 tablet 2   pantoprazole (PROTONIX) 40 MG tablet Take 1 tablet (40 mg total) by mouth daily. 30 tablet 3   prochlorperazine (COMPAZINE) 10 MG tablet TAKE 1 TABLET(10 MG) BY MOUTH EVERY 6 HOURS AS NEEDED FOR NAUSEA OR VOMITING 30 tablet 3   traMADol (ULTRAM) 50 MG tablet Take 1 tablet (50 mg total) by mouth every 6 (six) hours as needed (mild pain). (Patient not taking: Reported on 07/22/2020) 30 tablet 0   venlafaxine (EFFEXOR) 37.5 MG tablet Take 1 tablet (37.5 mg total) by mouth daily. 30 tablet 30   No current facility-administered medications for this visit.   Facility-Administered Medications Ordered in Other Visits  Medication Dose Route Frequency Provider Last Rate Last Admin   diphenhydrAMINE (BENADRYL) 50 MG/ML injection            famotidine (PEPCID) 20 mg IVPB            sodium chloride flush (NS) 0.9 % injection 10 mL  10 mL Intracatheter PRN Truitt Merle, MD   10 mL at 09/24/20 1722    PHYSICAL EXAMINATION: ECOG PERFORMANCE STATUS: 2 - Symptomatic, <50% confined to bed  There were no vitals filed for this visit. Wt Readings from Last 3 Encounters:  09/24/20 133 lb 12.8 oz (60.7 kg)  09/16/20 134 lb (60.8 kg)  09/09/20  131 lb 8 oz (59.6 kg)    Patient was evaluated in the infusion area. GENERAL:alert, no distress and comfortable SKIN: skin color normal, no rashes or significant lesions EYES: normal, Conjunctiva are pink and non-injected, sclera clear  NEURO: alert & oriented x 3 with fluent speech  LABORATORY DATA:  I have reviewed the data as listed CBC Latest Ref Rng & Units 09/24/2020 09/16/2020 09/09/2020  WBC 4.0 - 10.5 K/uL 5.2 5.6 4.6  Hemoglobin 12.0 - 15.0 g/dL 10.6(L) 10.5(L) 10.7(L)  Hematocrit 36.0 - 46.0 % 31.1(L) 30.7(L) 31.0(L)  Platelets 150 - 400 K/uL 341 320 311     CMP Latest Ref Rng & Units 09/24/2020 09/16/2020 09/09/2020  Glucose 70 - 99 mg/dL 135(H) 120(H) 90  BUN 6 - 20 mg/dL 13 10 12   Creatinine 0.44 - 1.00 mg/dL 0.73 0.66 0.69  Sodium 135 - 145 mmol/L 141 142 140  Potassium 3.5 - 5.1 mmol/L 3.7 3.9 4.2  Chloride 98 - 111 mmol/L 106 106 107  CO2 22 - 32 mmol/L 26 26 24   Calcium 8.9 - 10.3 mg/dL 9.6 9.7 9.5  Total Protein 6.5 - 8.1 g/dL 7.0 7.2 7.1  Total Bilirubin 0.3 - 1.2 mg/dL 0.4 0.4 0.4  Alkaline Phos 38 - 126 U/L 62 58 58  AST 15 - 41 U/L 48(H) 37 40  ALT 0 - 44 U/L 87(H) 63(H) 63(H)      RADIOGRAPHIC STUDIES: I have personally reviewed the radiological images as listed and agreed with the findings in the report. No results found.    Orders Placed This Encounter  Procedures   Ambulatory referral to Radiation Oncology    Referral Priority:   Routine    Referral Type:   Consultation    Referral Reason:   Specialty Services Required    Requested Specialty:   Radiation Oncology    Number of Visits Requested:   1   All  questions were answered. The patient knows to call the clinic with any problems, questions or concerns. No barriers to learning was detected.      Truitt Merle, MD 09/24/2020   I, Wilburn Mylar, am acting as scribe for Truitt Merle, MD.   I have reviewed the above documentation for accuracy and completeness, and I agree with the above.

## 2020-09-24 NOTE — Patient Instructions (Signed)
[facility]  Discharge Instructions: Thank you for choosing [facility] to provide your oncology and hematology care.   If you have a lab appointment with the [facility], please go directly to the [facility] and check in at the registration area.   Wear comfortable clothing and clothing appropriate for easy access to any Portacath or PICC line.   We strive to give you quality time with your provider. You may need to reschedule your appointment if you arrive late (15 or more minutes).  Arriving late affects you and other patients whose appointments are after yours.  Also, if you miss three or more appointments without notifying the office, you may be dismissed from the clinic at the provider's discretion.      For prescription refill requests, have your pharmacy contact our office and allow 72 hours for refills to be completed.    Today you received the following chemotherapy and/or immunotherapy agents Paclitaxel       To help prevent nausea and vomiting after your treatment, we encourage you to take your nausea medication as directed.  BELOW ARE SYMPTOMS THAT SHOULD BE REPORTED IMMEDIATELY: *FEVER GREATER THAN 100.4 F (38 C) OR HIGHER *CHILLS OR SWEATING *NAUSEA AND VOMITING THAT IS NOT CONTROLLED WITH YOUR NAUSEA MEDICATION *UNUSUAL SHORTNESS OF BREATH *UNUSUAL BRUISING OR BLEEDING *URINARY PROBLEMS (pain or burning when urinating, or frequent urination) *BOWEL PROBLEMS (unusual diarrhea, constipation, pain near the anus) TENDERNESS IN MOUTH AND THROAT WITH OR WITHOUT PRESENCE OF ULCERS (sore throat, sores in mouth, or a toothache) UNUSUAL RASH, SWELLING OR PAIN  UNUSUAL VAGINAL DISCHARGE OR ITCHING   Items with * indicate a potential emergency and should be followed up as soon as possible or go to the Emergency Department if any problems should occur.  Please show the CHEMOTHERAPY ALERT CARD or IMMUNOTHERAPY ALERT CARD at check-in to  the Emergency Department and triage nurse.  Should you have questions after your visit or need to cancel or reschedule your appointment, please contact [facility]  Dept: [phone]  and follow the prompts.  Office hours are 8:00 a.m. to 4:30 p.m. Monday - Friday. Please note that voicemails left after 4:00 p.m. may not be returned until the following business day.  We are closed weekends and major holidays. You have access to a nurse at all times for urgent questions. Please call the main number to the clinic Dept: [phone] and follow the prompts.   For any non-urgent questions, you may also contact your provider using MyChart. We now offer e-Visits for anyone 54 and older to request care online for non-urgent symptoms. For details visit [email or web address].   Also download the MyChart app! Go to the app store, search "MyChart", open the app, select [facility], and log in with your MyChart username and password.  Due to Covid, a mask is required upon entering the hospital/clinic. If you do not have a mask, one will be given to you upon arrival. For doctor visits, patients may have 1 support person aged 35 or older with them. For treatment visits, patients cannot have anyone with them due to current Covid guidelines and our immunocompromised population.

## 2020-09-27 ENCOUNTER — Telehealth: Payer: Self-pay | Admitting: Radiation Oncology

## 2020-09-27 NOTE — Telephone Encounter (Signed)
Called patient to schedule consultation with Shona Simpson, PA. No answer, LVM for return call.

## 2020-09-29 ENCOUNTER — Encounter: Payer: Self-pay | Admitting: Physical Therapy

## 2020-09-29 ENCOUNTER — Ambulatory Visit: Payer: 59 | Attending: Surgery | Admitting: Physical Therapy

## 2020-09-29 ENCOUNTER — Other Ambulatory Visit: Payer: Self-pay

## 2020-09-29 DIAGNOSIS — Z483 Aftercare following surgery for neoplasm: Secondary | ICD-10-CM | POA: Insufficient documentation

## 2020-09-29 DIAGNOSIS — M25611 Stiffness of right shoulder, not elsewhere classified: Secondary | ICD-10-CM | POA: Diagnosis present

## 2020-09-29 DIAGNOSIS — C50411 Malignant neoplasm of upper-outer quadrant of right female breast: Secondary | ICD-10-CM | POA: Insufficient documentation

## 2020-09-29 DIAGNOSIS — R293 Abnormal posture: Secondary | ICD-10-CM | POA: Insufficient documentation

## 2020-09-29 DIAGNOSIS — Z170 Estrogen receptor positive status [ER+]: Secondary | ICD-10-CM | POA: Diagnosis present

## 2020-09-29 NOTE — Therapy (Signed)
[facility] [address] [place], [place], [address] Phone: [phone]   Fax:  [phone]  Physical Therapy Treatment  Patient Details  Name: Caitlyn Miller MRN: [id number] Date of Birth: 06/07/1985 Referring Provider (PT): Dr. Matthew Tsuei/Feng   Encounter Date: 09/29/2020   PT End of Session - 09/29/20 1056     Visit Number 8    Number of Visits 12    Date for PT Re-Evaluation 11/24/20    PT Start Time 1008    PT Stop Time 1055    PT Time Calculation (min) 47 min    Activity Tolerance Patient tolerated treatment well    Behavior During Therapy WFL for tasks assessed/performed             Past Medical History:  Diagnosis Date   Abnormal Pap smear    Acid reflux    Anemia    Anxiety    Breast cancer (HCC)    Decreased appetite 10/08   Depression    Endometriosis 10/2004   Epigastric pain 10/2006   Family history of breast cancer 11/19/2019   FH: migraines    GBS carrier    H/O amenorrhea 06/2006   H/O dyspareunia 7/07   H/O fatigue    H/O nausea and vomiting 10/2006   H/O rubella    H/O varicella    Hyperemesis arising during pregnancy    First pregnancy   Irregular periods/menstrual cycles 02/2005   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   Pelvic pain 09/2008    Past Surgical History:  Procedure Laterality Date   AUGMENTATION MAMMAPLASTY Bilateral 2020   silicone    BREAST IMPLANT REMOVAL Right 05/18/2020   Procedure: REMOVAL OF RIGHT BREAST IMPLANT;  Surgeon: Tsuei, Matthew, MD;  Location: [facility];  Service: General;  Laterality: Right;   BREAST LUMPECTOMY WITH RADIOACTIVE SEED LOCALIZATION Right 04/15/2020   Procedure: RIGHT BREAST LUMPECTOMY WITH RADIOACTIVE SEED LOCALIZATION;  Surgeon: Tsuei, Matthew, MD;  Location: [facility];  Service: General;  Laterality: Right;   MODIFIED MASTECTOMY Right 05/18/2020   Procedure: RIGHT MODIFIED RADICAL MASTECTOMY;  Surgeon: Tsuei, Matthew,  MD;  Location: [facility] OR;  Service: General;  Laterality: Right;   PORTACATH PLACEMENT Left 06/10/2020   Procedure: INSERTION PORT-A-CATH;  Surgeon: Tsuei, Matthew, MD;  Location: [facility];  Service: General;  Laterality: Left;  45 MINUTES ROOM 2   RADIOACTIVE SEED GUIDED AXILLARY SENTINEL LYMPH NODE Right 04/15/2020   Procedure: RADIOACTIVE SEED GUIDED RIGHT AXILLARY SENTINEL LYMPH NODE DISSECTION;  Surgeon: Tsuei, Matthew, MD;  Location: [facility];  Service: General;  Laterality: Right;   SENTINEL NODE BIOPSY N/A 04/15/2020   Procedure: SENTINEL NODE BIOPSY;  Surgeon: Tsuei, Matthew, MD;  Location: [facility];  Service: General;  Laterality: N/A;   WISDOM TOOTH EXTRACTION      There were no vitals filed for this visit.   Subjective Assessment - 09/29/20 1009     Subjective I am now having pain when I lift my right arm. It goes all the way down my side. I am having trouble sleeping on my side.    Pertinent History Patient was diagnosed on 11/05/2019 with right grade II invasive ductal carcinoma breast cancer. It measures 4.7 cm and is located in the upper outer quadrant. It is ER/PR positive and HER2 negative with a Ki67 of 5%. She has a biopsied positive axillary lymph node. She had a Right lumpectomy with SLNB (2/3+) performed on 04/15/2020,    right modified mastectomy ALND (2/12), currently doing chemo and will need radiation    Patient Stated Goals Get back to normal use of right UE    Currently in Pain? Yes    Pain Score 5     Pain Location --   R trunk   Pain Orientation Right    Pain Descriptors / Indicators Sharp;Stabbing    Pain Type Acute pain    Pain Onset 1 to 4 weeks ago    Pain Frequency Constant    Aggravating Factors  laying on it    Pain Relieving Factors nothing    Effect of Pain on Daily Activities hard to sleep because the pain wakes her up when she turns over                               [facility] Adult PT  Treatment/Exercise - 09/29/20 0001       Shoulder Exercises: Pulleys   Flexion 2 minutes    ABduction 2 minutes   pt reported decreased pain at end of pulleys     Shoulder Exercises: Therapy Ball   Flexion 10 reps;Both   with stretch at end range   ABduction 10 reps;Right   with stretch at end range     Manual Therapy   Myofascial Release to cording in R axilla with 3 cords palpable and then to cording on anterior chest inferior to mastectomy scar with 2 cords palpapable, also to R trunk using cross hands technique                          PT Long Term Goals - 09/01/20 1007       PT LONG TERM GOAL #1   Title Patient will demonstrate she has regained near  full shoulder ROM and function post operatively compared to baselines.    Baseline End ROM improved but still limited by cording- 05/13/20; 07/15/20- pt limited at end range by tightness though ROM has improved; 09/01/20- pt is no longer limited at end range    Time 6    Period Weeks    Status Achieved      PT LONG TERM GOAL #2   Title Pt will have decreased pain by atleast 50%    Baseline Pt reports no pain at this time as motion improves-05/13/20    Time 3    Period Weeks    Status Achieved      PT LONG TERM GOAL #3   Title Pt will have quick dash no greater than 20% for return to function prior to next surgery    Baseline 70%; pt did not retake this today but reports no longer feeling limited with reaching and ADLs-05/13/20; 09/01/20- 13.64    Time 3    Period Weeks    Status Achieved      PT LONG TERM GOAL #4   Title pt will be able to dress, bathe and perform home activities with improved ease    Time 3    Period Weeks    Status Achieved      PT LONG TERM GOAL #5   Title Pt will be independent in a home exercise program for continued strengthening and stretching so she can avoid increased tightness with radiation    Time 6    Period Weeks    Status On-going  Plan -  09/29/20 1057     Clinical Impression Statement Pt having increased pain today especially from R axilla extending down R lateral trunk and inferior to mastectomy scar. Began AAROM exercises using pulleys and ball and educated pt to purchase a set of over the door pulleys to begin using at home on a regular basis to help decrease pain. Pt reports she has been using the tennis ball on th ewall on her lats and serratus and that it continues to help. Today numerous cords are palpable in R axilla and 2 cords are palpable inferior to R mastectomy scar. Spent session focused on myofascial release to these cords. No cords released by they were not as prominent by end of session.    PT Frequency Biweekly    PT Duration 12 weeks    PT Treatment/Interventions ADLs/Self Care Home Management;Therapeutic exercise;Patient/family education;Manual techniques;Manual lymph drainage;Scar mobilization;Passive range of motion;Orthotic Fit/Training    PT Next Visit Plan MFR to cording in R axilla and anterior chest, see how R infraspinatus is,and instructing in an HEP for R shoulder ROM and strength, instruct in Strength ABC - pt to begin radiation in sept    PT Home Exercise Plan Post op shoulder ROM HEP (supine for flex and stargazer), supine dowel flex and abd    Consulted and Agree with Plan of Care Patient             Patient will benefit from skilled therapeutic intervention in order to improve the following deficits and impairments:  Postural dysfunction, Decreased range of motion, Impaired UE functional use, Pain, Decreased knowledge of precautions, Decreased activity tolerance, Impaired sensation, Decreased strength, Increased fascial restricitons  Visit Diagnosis: Aftercare following surgery for neoplasm  Stiffness of right shoulder, not elsewhere classified  Abnormal posture  Malignant neoplasm of upper-outer quadrant of right breast in female, estrogen receptor positive (HCC)     Problem  List Patient Active Problem List   Diagnosis Date Noted   Port-A-Cath in place 09/24/2020   Menopausal syndrome (hot flushes) 12/23/2019   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   Genetic testing 11/26/2019   Family history of breast cancer 11/19/2019   Malignant neoplasm of upper-outer quadrant of right breast in female, estrogen receptor positive (HCC) 11/17/2019   S/P breast augmentation 09/18/2019   Hx of migraines 06/20/2011   GERD (gastroesophageal reflux disease) 06/20/2011   Anemia 06/20/2011    Blaire Breedlove Blue, PT 09/29/2020, 11:02 AM   [facility] [address] [place], [place], [address] Phone: [phone]   Fax:  [phone]  Name: Assata Niese MRN: [id number] Date of Birth: 10/25/1985   Blaire Breedlove Blue, PT 09/29/20 11:02 AM  

## 2020-09-30 ENCOUNTER — Inpatient Hospital Stay: Payer: 59

## 2020-09-30 ENCOUNTER — Encounter: Payer: Self-pay | Admitting: *Deleted

## 2020-09-30 ENCOUNTER — Encounter: Payer: Self-pay | Admitting: Hematology

## 2020-09-30 ENCOUNTER — Inpatient Hospital Stay (HOSPITAL_BASED_OUTPATIENT_CLINIC_OR_DEPARTMENT_OTHER): Payer: 59 | Admitting: Hematology

## 2020-09-30 VITALS — BP 116/83 | HR 88 | Temp 98.1°F | Resp 17 | Ht 64.0 in | Wt 135.9 lb

## 2020-09-30 DIAGNOSIS — C50411 Malignant neoplasm of upper-outer quadrant of right female breast: Secondary | ICD-10-CM

## 2020-09-30 DIAGNOSIS — Z170 Estrogen receptor positive status [ER+]: Secondary | ICD-10-CM

## 2020-09-30 DIAGNOSIS — Z5111 Encounter for antineoplastic chemotherapy: Secondary | ICD-10-CM | POA: Diagnosis not present

## 2020-09-30 DIAGNOSIS — Z95828 Presence of other vascular implants and grafts: Secondary | ICD-10-CM

## 2020-09-30 LAB — CMP (CANCER CENTER ONLY)
ALT: 130 U/L — ABNORMAL HIGH (ref 0–44)
AST: 60 U/L — ABNORMAL HIGH (ref 15–41)
Albumin: 4 g/dL (ref 3.5–5.0)
Alkaline Phosphatase: 67 U/L (ref 38–126)
Anion gap: 8 (ref 5–15)
BUN: 11 mg/dL (ref 6–20)
CO2: 25 mmol/L (ref 22–32)
Calcium: 9.4 mg/dL (ref 8.9–10.3)
Chloride: 106 mmol/L (ref 98–111)
Creatinine: 0.64 mg/dL (ref 0.44–1.00)
GFR, Estimated: >60 mL/min (ref >60)
Glucose, Bld: 79 mg/dL (ref 70–99)
Potassium: 4.1 mmol/L (ref 3.5–5.1)
Sodium: 139 mmol/L (ref 135–145)
Total Bilirubin: 0.4 mg/dL (ref 0.3–1.2)
Total Protein: 7 g/dL (ref 6.5–8.1)

## 2020-09-30 LAB — CBC WITH DIFFERENTIAL (CANCER CENTER ONLY)
Abs Immature Granulocytes: 0.11 10*3/uL — ABNORMAL HIGH (ref 0.00–0.07)
Basophils Absolute: 0 10*3/uL (ref 0.0–0.1)
Basophils Relative: 1 %
Eosinophils Absolute: 0.1 10*3/uL (ref 0.0–0.5)
Eosinophils Relative: 1 %
HCT: 30 % — ABNORMAL LOW (ref 36.0–46.0)
Hemoglobin: 10 g/dL — ABNORMAL LOW (ref 12.0–15.0)
Immature Granulocytes: 2 %
Lymphocytes Relative: 31 %
Lymphs Abs: 1.9 10*3/uL (ref 0.7–4.0)
MCH: 32.1 pg (ref 26.0–34.0)
MCHC: 33.3 g/dL (ref 30.0–36.0)
MCV: 96.2 fL (ref 80.0–100.0)
Monocytes Absolute: 0.4 10*3/uL (ref 0.1–1.0)
Monocytes Relative: 7 %
Neutro Abs: 3.5 10*3/uL (ref 1.7–7.7)
Neutrophils Relative %: 58 %
Platelet Count: 335 10*3/uL (ref 150–400)
RBC: 3.12 MIL/uL — ABNORMAL LOW (ref 3.87–5.11)
RDW: 14.4 % (ref 11.5–15.5)
WBC Count: 5.9 10*3/uL (ref 4.0–10.5)
nRBC: 0 % (ref 0.0–0.2)

## 2020-09-30 MED ORDER — SODIUM CHLORIDE 0.9 % IV SOLN
70.0000 mg/m2 | Freq: Once | INTRAVENOUS | Status: AC
  Administered 2020-09-30: 114 mg via INTRAVENOUS
  Filled 2020-09-30: qty 19

## 2020-09-30 MED ORDER — SODIUM CHLORIDE 0.9 % IV SOLN
10.0000 mg | Freq: Once | INTRAVENOUS | Status: AC
  Administered 2020-09-30: 10 mg via INTRAVENOUS
  Filled 2020-09-30: qty 10

## 2020-09-30 MED ORDER — DIPHENHYDRAMINE HCL 50 MG/ML IJ SOLN
25.0000 mg | Freq: Once | INTRAMUSCULAR | Status: AC
  Administered 2020-09-30: 25 mg via INTRAVENOUS
  Filled 2020-09-30: qty 1

## 2020-09-30 MED ORDER — ONDANSETRON 8 MG PO TBDP: 8.0000 mg | ORAL_TABLET | Freq: Three times a day (TID) | ORAL | 0 refills | Status: DC | PRN

## 2020-09-30 MED ORDER — SODIUM CHLORIDE 0.9% FLUSH
10.0000 mL | INTRAVENOUS | Status: DC | PRN
  Administered 2020-09-30: 10 mL

## 2020-09-30 MED ORDER — HEPARIN SOD (PORK) LOCK FLUSH 100 UNIT/ML IV SOLN
500.0000 [IU] | Freq: Once | INTRAVENOUS | Status: AC | PRN
  Administered 2020-09-30: 500 [IU]

## 2020-09-30 MED ORDER — SODIUM CHLORIDE 0.9% FLUSH
10.0000 mL | Freq: Once | INTRAVENOUS | Status: AC
Start: 2020-09-30 — End: 2020-09-30
  Administered 2020-09-30: 10 mL

## 2020-09-30 MED ORDER — SODIUM CHLORIDE 0.9 % IV SOLN: Freq: Once | INTRAVENOUS | Status: AC

## 2020-09-30 MED ORDER — FAMOTIDINE 20 MG IN NS 100 ML IVPB
20.0000 mg | Freq: Once | INTRAVENOUS | Status: AC
  Administered 2020-09-30: 20 mg via INTRAVENOUS
  Filled 2020-09-30: qty 100

## 2020-09-30 NOTE — Progress Notes (Signed)
[place]   Telephone:([phone]) [phone] [phone]   Clinic Follow up Note   Patient Care Team: [name], Lilia Argue, MD as PCP - General (Family Medicine) Mansouraty, Telford Nab., MD as Consulting Physician (Gastroenterology) Mauro Kaufmann, RN as Oncology Nurse Navigator Rockwell Germany, RN as Oncology Nurse Navigator Donnie Mesa, MD as Consulting Physician (General Surgery) Truitt Merle, MD as Consulting Physician (Hematology) Kyung Rudd, MD as Consulting Physician (Radiation Oncology) Contogiannis, Audrea Muscat, MD as Consulting Physician (Plastic Surgery)  Date of Service:  09/30/2020  CHIEF COMPLAINT: f/u of right breast cancer  CURRENT THERAPY:  Completing adjuvant Taxol, q7d, starting 07/08/20  ASSESSMENT & PLAN:  Caitlyn Miller is a 35 y.o. female with   1. Malignant neoplasm of upper-outer quadrant of right breast, Stage IIA, p(T2N1aM0), ER+/PR+/HER2-, Grade II, Mammaprint luminal type A, low risk -She has a 4.7cm right breast mass at 11:00 position and two abnormal right axillary LN. 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 -Her 11/2019 breast MRI and PET scan did not indicate left breast or distant malignancy or metastasis.  -Mammaprint showed low risk disease and neoadjuvant chemotherapy is not recommended.  -She started neoadjuvant tamoxifen on 12/01/19. She tolerated poorly with nausea, and diffuse body aches. Dose reduced to $RemoveBe'10mg'nNtkwGCkO$  from 12/09/19 then switched to anastrozole on 01/06/20. I started her on monthly Zoladex injection on 12/09/19. -I started her on Verzenio on 01/12/2020, dose reduced to $RemoveBe'50mg'xffOeuoYa$  am and $Remo'100mg'SVomL$  01/27/2020 due to poor tolerance. Held Verzenio on 03/25/20 to proceed with breast surgery. -Given her 03/01/20 [place] and mammogram indicated good response to treatment, she proceeded with right lumpectomy and sentinel LN dissection on 04/15/20 with Dr  Georgette Dover. Surgical pathology showed larger than expected tumor at 3.1cm of invasive carcinoma with mixed ductal and lobular features and DCIS components. She also had positive margins and 2/3 positive LNs. -She underwent mastectomy and axillary node dissection on 05/18/20 showing: residual invasive carcinoma and DCIS. She again had a positive margin and another 2/12 positive nodes. She had stage III disease.  -chest CT 06/17/20 and CT AP 07/02/20 were negative. -She had first cycle AC on 06/24/20 and tolerated very poorly with a variety of symptoms causing 2 ED visits. -We moved to weekly Taxol on 07/08/20. She is tolerating it better. She is tolerating less well as treatment has gone on, but she insisted on finishing. Today is her last treatment, and I will reduce her dose slightly due to fatigue and mild neuropathy. -I recommend waiting to remove her port until after her next scan after she finish RT  -She is scheduled to meet back with Dr. Lisbeth Renshaw on 10/06/20. -labs and f/u in 1 month then again when she is closer to finishing radiation. She agrees to restart Tamoxifen after RT, but does not want AI and Verzenio    2. Skin lesion -she reports a birthmark to her scalp that she noticed has been changing and recently bleeding. -I previously referred her to dermatology. She has not heard from them yet.   3. 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 She is on gabapentin BID at night (did not tolerate higher dose) -she feels this has worsened in the last week, but she is determined to finish treatment.  -her neuropathy is mild on exam today.  4. Transaminitis; Digestive issues, reflux -Developed after chemo Taxol, likely secondary to chemotherapy -Ultrasound of liver 07/29/20 was unremarkable -She knows to avoid Tylenol, she does not drink alcohol -Her most recent LFT from  09/09/20 were improved. -Continue monitoring closely, will continue Taxol if AST and ALT less than 500, with normal bilirubin  -She is on pantoprazole. Will continue   5. Generalized Weakness, body pain, Fatigue, hot flushes  -Pt notes for the past 3+ years she has had mid chest and mid back pain. Previously intermittent, now constant. She also notes b/l arm and leg weakness and pain -This has lead to fatigue, low appetite, decreased daily function/activity. -Pt notes prior workup for this in [place] was negative with Neurologist and cardiologist. They said this is related to stress, anxiety or depression. Pt feels she has an illness causing this instead. -Her 11/25/19 PET was negative for any distant malignancy or metastasis.  -This has returned to baseline since stopping AC treatment. -will increase her Effexor for her worsening hot flashes   6. Anxiety, depression -She was previously on Lexapro.  I switched her to Effexor, she is tolerating better  -I previously discussed increasing Effexor to twice daily.    7. 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   -She has discussed with our genetic consoler    8. Social Support -She is from [place] and lives both there and in [place] with her husband. She has 2 teenage children. The rest of her family is in [place].  -She notes her mother is living with her now. Her mother is able to help her, especially with her kids. -She continues Zoladex injection q4weeks. She would like to stop now that she is done with chemo.     PLAN:  -Proceed with final taxol today -f/u with rad onc on 10/06/20 to proceed adjuvant RT  -labs and f/u in 1 month   No problem-specific Assessment & Plan notes found for this encounter.   SUMMARY OF ONCOLOGIC HISTORY: Oncology History Overview Note  Cancer Staging Malignant neoplasm of upper-outer quadrant  of right breast in female, estrogen receptor positive ([place]) Staging form: Breast, AJCC 8th Edition - Clinical stage from 11/12/2019: Stage IIA (cT2, cN1, cM0, G2, ER+, PR+, HER2-) - Signed by Truitt Merle, MD on 11/19/2019 Stage prefix: Initial diagnosis Histologic grading system: 3 grade system - Pathologic stage from 04/15/2020: No Stage Recommended (ypT2, pN2a, cM0, G2, ER+, PR+, HER2-) - Signed by Truitt Merle, MD on 05/27/2020 Stage prefix: Post-therapy Histologic grading system: 3 grade system Residual tumor (R): R1 - Microscopic    Malignant neoplasm of upper-outer quadrant of right breast in female, estrogen receptor positive ([place])  11/05/2019 Mammogram   IMPRESSION: Large irregular palpable mass 4.7 x 1.4 x 1.4 cm within the upper-outer right breast 11 o'clock position, 4 cm from nipple.  There is a large area of associated coarse heterogeneous calcifications within the mass. Overall findings are concerning for breast carcinoma.   Two cortically thickened right axillary lymph nodes which are indeterminate in etiology.   11/12/2019 Cancer Staging   Staging form: Breast, AJCC 8th Edition - Clinical stage from 11/12/2019: Stage IIA (cT2, cN1, cM0, G2, ER+, PR+, HER2-) - Signed by Truitt Merle, MD on 11/19/2019   11/12/2019 Initial Biopsy   Diagnosis 1. Breast, right, needle core biopsy, right - INVASIVE MAMMARY CARCINOMA, SEE COMMENT. - MAMMARY CARCINOMA IN SITU. 2. Lymph node, needle/core biopsy, right -  METASTATIC MAMMARY CARCINOMA. Microscopic Comment 1. The carcinoma appears grade 2 and measures 16 mm in greatest linear extent. E-cadherin will be ordered. Prognostic makers will be ordered. Dr. Saralyn Pilar has reviewed the case. The case was called to [facility] on 01/13/2020.    1. E-cadherin is strongly positive consistent with a ductal phenotype.   11/12/2019 Receptors her2   1. PROGNOSTIC INDICATORS Results: IMMUNOHISTOCHEMICAL AND MORPHOMETRIC ANALYSIS  PERFORMED MANUALLY The tumor cells are NEGATIVE for Her2 (0). Estrogen Receptor: 100%, POSITIVE, STRONG STAINING INTENSITY Progesterone Receptor: 100%, POSITIVE, STRONG STAINING INTENSITY Proliferation Marker Ki67: 5%   11/17/2019 Initial Diagnosis   Malignant neoplasm of upper-outer quadrant of right breast in female, estrogen receptor positive ([place])   11/25/2019 Breast MRI   IMPRESSION: 1. Large area of non-mass enhancement involving the UPPER OUTER QUADRANT of the RIGHT breast measuring approximately 4.2 x 4.1 x 2.4 cm. The biopsy-proven IDC and DCIS is present at the superomedial aspect of this NME. 2. No MRI evidence of malignancy involving the LEFT breast. 3. Intact BILATERAL retropectoral implants. 4. 2 pathologically enlarged RIGHT axillary lymph nodes. One of these nodes is biopsy-proven metastatic disease. No pathologic lymphadenopathy elsewhere   11/25/2019 PET scan   IMPRESSION: 1. Two mildly enlarged hypermetabolic right axillary lymph nodes compatible with right axillary nodal metastases. 2. No additional sites of hypermetabolic metastatic disease. 3. Asymmetric indistinct upper right breast hypermetabolism, without discrete mass correlate on the CT images, compatible with known primary right breast malignancy.     11/26/2019 Genetic Testing   Negative genetic testing: no pathogenic variants detected in [facility] STAT Breast Cancer Panel.  Variant of uncertain significance (VUS) detected in CHEK2 at c.1556G>T (p.Arg519Leu).  The report date is November 26, 2019.   The STAT Breast cancer panel offered by [facility] includes sequencing and rearrangement analysis for the following 9 genes:  ATM, BRCA1, BRCA2, CDH1, CHEK2, PALB2, PTEN, STK11 and TP53.    Results of [facility] Multi-Cancer Panel are pending.    12/01/2019 -  Anti-estrogen oral therapy   Neoadjuvant Tamoxifen 20mg  on 12/01/19. Reduced to 10mg  on 12/09/19.       ---Zoladex injection monthly starting 12/09/19.        ---switched Tamoxifen to anastrozole on 01/06/20   12/08/2019 Genetic Testing   Positive genetic testing: pathogenic variant detected in RAD51D c.694C>T (p.Arg232*) through [facility] Multi-Cancer Panel.  Variants of uncertain significance detected RAD51D at c.715C>T (p.Arg239Trp) and CHEK2 at c.1556G>T (p.Arg519Leu).  The report date is December 08, 2019.   The Multi-Cancer Panel offered by [facility] includes sequencing and/or deletion duplication testing of the following 85 genes: AIP, ALK, APC, ATM, AXIN2,BAP1,  BARD1, BLM, BMPR1A, BRCA1, BRCA2, BRIP1, CASR, CDC73, CDH1, CDK4, CDKN1B, CDKN1C, CDKN2A (p14ARF), CDKN2A (p16INK4a), CEBPA, CHEK2, CTNNA1, DICER1, DIS3L2, EGFR (c.2369C>T, p.Thr790Met variant only), EPCAM (Deletion/duplication testing only), FH, FLCN, GATA2, GPC3, GREM1 (Promoter region deletion/duplication testing only), HOXB13 (c.251G>A, p.Gly84Glu), HRAS, KIT, MAX, MEN1, MET, MITF (c.952G>A, p.Glu318Lys variant only), MLH1, MSH2, MSH3, MSH6, MUTYH, NBN, NF1, NF2, NTHL1, PALB2, PDGFRA, PHOX2B, PMS2, POLD1, POLE, POT1, PRKAR1A, PTCH1, PTEN, RAD50, RAD51C, RAD51D, RB1, RECQL4, RET, RNF43, RUNX1, SDHAF2, SDHA (sequence changes only), SDHB, SDHC, SDHD, SMAD4, SMARCA4, SMARCB1, SMARCE1, STK11, SUFU, TERC, TERT, TMEM127, TP53, TSC1, TSC2, VHL, WRN and WT1.    12/09/2019 Miscellaneous   Mammaprint luminal type A, low risk  10% risk of recurrence in 10 years with 97.8% benefor of hormaonal therapy alone.    01/12/2020 - 03/25/2020 Chemotherapy   Verzenio started on 01/12/2020, dose  reduced to $RemoveBe'50mg'NqVPQeBgG$  am and $Remo'100mg'MtvUh$  01/27/2020 due to poor tolerance. Held Verzenio on 03/25/20 to proceed with breast surgery.    03/01/2020 Mammogram   Targeted ultrasound is performed, showing interval decreasing conspicuity of the patient's known right breast cancer. A post biopsy clip with an associated irregular area shadowing is demonstrated at the 11 o'clock position 4 cm from the nipple. Exact measurements are  difficult due to the vague appearance of the findings. It measures approximately 1.2 x 0.9 x 0.6 cm (previously 4.7 x 2.2 x 1.5 cm).   IMPRESSION: Imaging findings consistent with good response to chemotherapy.   03/16/2020 Imaging   MRI Breast  IMPRESSION: 1. Interval resolution of RIGHT axillary adenopathy. 2. Slightly smaller area of non mass enhancement in the UPPER-OUTER QUADRANT of the RIGHT breast.   04/15/2020 Surgery   RIGHT BREAST LUMPECTOMY WITH RADIOACTIVE SEED LOCALIZATION and RADIOACTIVE SEED GUIDED RIGHT AXILLARY SENTINEL LYMPH NODE DISSECTION and SENTINEL NODE BIOPSY by Dr Georgette Dover    04/15/2020 Pathology Results   FINAL MICROSCOPIC DIAGNOSIS:   A. 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  - Invasive carcinoma broadly involves each the anterior, posterior,  superior and inferior margins.  - DCIS involves the inferior margin and is < 1 mm from each the anterior  and superior margins.  - Atypical lobular hyperplasia.  - Biopsy sites.  - See oncology table.   B. TARGETED LYMPH NODE, RIGHT AXILLA, BIOPSY:  - Metastatic carcinoma in (1) of (1) lymph node.  - Biopsy site.   C. SENTINEL LYMPH NODE, RIGHT AXILLA #1, BIOPSY:  - Metastatic carcinoma in (1) of (1) lymph node.   D. SENTINEL LYMPH NODE, RIGHT AXILLA #2, BIOPSY:  - One lymph node, negative for carcinoma (0/1).    04/15/2020 Cancer Staging   Staging form: Breast, AJCC 8th Edition - Pathologic stage from 04/15/2020: No Stage Recommended (ypT2, pN2a, cM0, G2, ER+, PR+, HER2-) - Signed by Truitt Merle, MD on 05/27/2020 Stage prefix: Post-therapy Histologic grading system: 3 grade system Residual tumor (R): R1 - Microscopic   05/18/2020 Surgery   Right Mastectomy  A. BREAST, RIGHT, MASTECTOMY:  - Residual invasive carcinoma with mixed  ductal and lobular features.       Residual invasive carcinoma broadly involves the anterior inferior  soft tissue margin.       Residual invasive carcinoma is focally < 1 mm from the deep margin.  - Residual ductal carcinoma in situ.       Residual DCIS is 3 mm from the closest margin, deep.  - Residual atypical lobular hyperplasia.  - Prior procedure site changes.  - Biopsy clip (X2).   B. BREAST IMPLANT, RIGHT, EXPLANTATION:  - Implant (gross only).   C. LYMPH NODES, RIGHT AXILLA, DISSECTION:  - Metastatic carcinoma in (2) of (12) lymph nodes.  - Prior procedure site changes in surrounding soft tissue.    06/24/2020 -  Chemotherapy    Patient is on Treatment Plan: BREAST ADJUVANT DOSE DENSE AC Q14D / PACLITAXEL Q7D          INTERVAL HISTORY:  Caitlyn Miller is here for a follow up of breast cancer. She was last seen by me on 09/24/20. She presents to the clinic accompanied by her husband. She reports she was very sick for the 3 days following her last treatment. She  notes she was unable to eat or function and had chills. She notes she did not have much of a fever. She reports she was able to do things on her own, such as go to the bathroom and drink fluids, but not much else. She notes she mostly stayed in bed.  She reports her neuropathy is stable-- she notes numbness and denies difficulty with fine motor skills. She notes they plan to travel back to [place] the last week of November for 2-3 weeks.   All other systems were reviewed with the patient and are negative.  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   Pelvic pain 09/2008    SURGICAL HISTORY: Past Surgical History:  Procedure Laterality Date   AUGMENTATION MAMMAPLASTY Bilateral 1423   silicone    BREAST IMPLANT REMOVAL Right 05/18/2020   Procedure: REMOVAL OF RIGHT BREAST IMPLANT;  Surgeon: Donnie Mesa, MD;  Location: [facility];  Service: General;  Laterality: Right;   BREAST LUMPECTOMY WITH RADIOACTIVE SEED LOCALIZATION Right 04/15/2020   Procedure: RIGHT BREAST LUMPECTOMY WITH RADIOACTIVE SEED LOCALIZATION;  Surgeon: Donnie Mesa, MD;  Location: [facility];  Service: General;  Laterality: Right;   MODIFIED MASTECTOMY Right 05/18/2020   Procedure: RIGHT MODIFIED RADICAL MASTECTOMY;  Surgeon: Donnie Mesa, MD;  Location: [facility];  Service: General;  Laterality: Right;   PORTACATH PLACEMENT Left 06/10/2020   Procedure: INSERTION PORT-A-CATH;  Surgeon: Donnie Mesa, MD;  Location: [facility];  Service: General;  Laterality: Left;  45 MINUTES ROOM 2   RADIOACTIVE SEED GUIDED AXILLARY SENTINEL LYMPH NODE Right 04/15/2020   Procedure: RADIOACTIVE SEED GUIDED RIGHT AXILLARY SENTINEL LYMPH NODE DISSECTION;  Surgeon: Donnie Mesa, MD;  Location: [place] [facility];  Service: General;  Laterality: Right;   SENTINEL NODE BIOPSY N/A 04/15/2020   Procedure: SENTINEL NODE BIOPSY;  Surgeon: Donnie Mesa, MD;  Location: [facility];  Service: General;  Laterality: N/A;   WISDOM TOOTH EXTRACTION      I have reviewed the social history and family history with the patient and they are unchanged from previous note.  ALLERGIES:  is allergic to ibuprofen.  MEDICATIONS:  Current Outpatient Medications  Medication Sig Dispense Refill   ondansetron (ZOFRAN ODT) 8 MG disintegrating tablet Take 1 tablet (8 mg total) by mouth every  8 (eight) hours as needed for nausea or vomiting. 30 tablet 0   acetaminophen (TYLENOL) 500 MG tablet Take 500 mg by mouth every 6 (six) hours as needed for moderate pain.     clindamycin (CLINDAGEL) 1 % gel Apply topically 2 (two) times daily. 30 g 0   gabapentin (NEURONTIN) 100 MG capsule TAKE 2 CAPSULES(200 MG) BY MOUTH AT BEDTIME 60 capsule 1   hydrocortisone 1 % lotion Apply 1 application topically daily as  needed (rash). 118 mL 0   lidocaine (LIDODERM) 5 % Place 1 patch onto the skin daily. Remove & Discard patch within 12 hours or as directed by MD 30 patch 0   lidocaine-prilocaine (EMLA) cream Apply to affected area once (Patient taking differently: Apply 1 application topically daily as needed (port access).) 30 g 3   magnesium oxide (MAG-OX) 400 (240 Mg) MG tablet Take 1 tablet (400 mg total) by mouth daily. (Patient not taking: Reported on 07/22/2020) 20 tablet 0   ondansetron (ZOFRAN) 8 MG tablet Take 1 tablet (8 mg total) by mouth every 8 (eight) hours as needed. Start on the third day after chemotherapy. 30 tablet 2   pantoprazole (PROTONIX) 40 MG tablet Take 1 tablet (40 mg total) by mouth daily. 30 tablet 3   prochlorperazine (COMPAZINE) 10 MG tablet TAKE 1 TABLET(10 MG) BY MOUTH EVERY 6 HOURS AS NEEDED FOR NAUSEA OR VOMITING 30 tablet 3   traMADol (ULTRAM) 50 MG tablet Take 1 tablet (50 mg total) by mouth every 6 (six) hours as needed (mild pain). (Patient not taking: Reported on 07/22/2020) 30 tablet 0   venlafaxine (EFFEXOR) 37.5 MG tablet Take 1 tablet (37.5 mg total) by mouth daily. 30 tablet 30   No current facility-administered medications for this visit.   Facility-Administered Medications Ordered in Other Visits  Medication Dose Route Frequency Provider Last Rate Last Admin   sodium chloride flush (NS) 0.9 % injection 10 mL  10 mL Intracatheter PRN Truitt Merle, MD   10 mL at 09/30/20 1638    PHYSICAL EXAMINATION: ECOG PERFORMANCE STATUS: 2  Vitals:   09/30/20 1310  BP:  116/83  Pulse: 88  Resp: 17  Temp: 98.1 F (36.7 C)  SpO2: 98%   Wt Readings from Last 3 Encounters:  09/30/20 135 lb 14.4 oz (61.6 kg)  09/24/20 133 lb 12.8 oz (60.7 kg)  09/16/20 134 lb (60.8 kg)     GENERAL:alert, no distress and comfortable SKIN: skin color normal, no rashes or significant lesions EYES: normal, Conjunctiva are pink and non-injected, sclera clear  NEURO: alert & oriented x 3 with fluent speech  LABORATORY DATA:  I have reviewed the data as listed CBC Latest Ref Rng & Units 09/30/2020 09/24/2020 09/16/2020  WBC 4.0 - 10.5 K/uL 5.9 5.2 5.6  Hemoglobin 12.0 - 15.0 g/dL 10.0(L) 10.6(L) 10.5(L)  Hematocrit 36.0 - 46.0 % 30.0(L) 31.1(L) 30.7(L)  Platelets 150 - 400 K/uL 335 341 320     CMP Latest Ref Rng & Units 09/30/2020 09/24/2020 09/16/2020  Glucose 70 - 99 mg/dL 79 135(H) 120(H)  BUN 6 - 20 mg/dL _0 Creatinine 0.44 - 1.00 mg/dL 0.64 0.73 0.66  Sodium 135 - 145 mmol/L 139 141 142  Potassium 3.5 - 5.1 mmol/L 4.1 3.7 3.9  Chloride 98 - 111 mmol/L 106 106 106  CO2 22 - 32 mmol/L _1 Calcium 8.9 - 10.3 mg/dL 9.4 9.6 9.7  Total Protein 6.5 - 8.1 g/dL 7.0 7.0 7.2  Total Bilirubin 0.3 - 1.2 mg/dL 0.4 0.4 0.4  Alkaline Phos 38 - 126 U/L 67 62 58  AST 15 - 41 U/L 60(H) 48(H) 37  ALT 0 - 44 U/L 130(H) 87(H) 63(H)      RADIOGRAPHIC STUDIES: I have personally reviewed the radiological images as listed and agreed with the findings in the report. No results found.    No orders of the defined types were placed in this encounter.  All questions were  answered. The patient knows to call the clinic with any problems, questions or concerns. No barriers to learning was detected. The total time spent in the appointment was 30 minutes.     Truitt Merle, MD 09/30/2020   I, Wilburn Mylar, am acting as scribe for Truitt Merle, MD.   I have reviewed the above documentation for accuracy and completeness, and I agree with the above.

## 2020-09-30 NOTE — Patient Instructions (Signed)
[facility]  Discharge Instructions: Thank you for choosing [facility] to provide your oncology and hematology care.   If you have a lab appointment with the [facility], please go directly to the [facility] and check in at the registration area.   Wear comfortable clothing and clothing appropriate for easy access to any Portacath or PICC line.   We strive to give you quality time with your provider. You may need to reschedule your appointment if you arrive late (15 or more minutes).  Arriving late affects you and other patients whose appointments are after yours.  Also, if you miss three or more appointments without notifying the office, you may be dismissed from the clinic at the provider's discretion.      For prescription refill requests, have your pharmacy contact our office and allow 72 hours for refills to be completed.    Today you received the following chemotherapy and/or immunotherapy agents Taxol      To help prevent nausea and vomiting after your treatment, we encourage you to take your nausea medication as directed.  BELOW ARE SYMPTOMS THAT SHOULD BE REPORTED IMMEDIATELY: *FEVER GREATER THAN 100.4 F (38 C) OR HIGHER *CHILLS OR SWEATING *NAUSEA AND VOMITING THAT IS NOT CONTROLLED WITH YOUR NAUSEA MEDICATION *UNUSUAL SHORTNESS OF BREATH *UNUSUAL BRUISING OR BLEEDING *URINARY PROBLEMS (pain or burning when urinating, or frequent urination) *BOWEL PROBLEMS (unusual diarrhea, constipation, pain near the anus) TENDERNESS IN MOUTH AND THROAT WITH OR WITHOUT PRESENCE OF ULCERS (sore throat, sores in mouth, or a toothache) UNUSUAL RASH, SWELLING OR PAIN  UNUSUAL VAGINAL DISCHARGE OR ITCHING   Items with * indicate a potential emergency and should be followed up as soon as possible or go to the Emergency Department if any problems should occur.  Please show the CHEMOTHERAPY ALERT CARD or IMMUNOTHERAPY ALERT CARD at check-in to the  Emergency Department and triage nurse.  Should you have questions after your visit or need to cancel or reschedule your appointment, please contact [facility]  Dept: [phone]  and follow the prompts.  Office hours are 8:00 a.m. to 4:30 p.m. Monday - Friday. Please note that voicemails left after 4:00 p.m. may not be returned until the following business day.  We are closed weekends and major holidays. You have access to a nurse at all times for urgent questions. Please call the main number to the clinic Dept: [phone] and follow the prompts.   For any non-urgent questions, you may also contact your provider using MyChart. We now offer e-Visits for anyone 47 and older to request care online for non-urgent symptoms. For details visit [email or web address].   Also download the MyChart app! Go to the app store, search "MyChart", open the app, select [facility], and log in with your MyChart username and password.  Due to Covid, a mask is required upon entering the hospital/clinic. If you do not have a mask, one will be given to you upon arrival. For doctor visits, patients may have 1 support person aged 91 or older with them. For treatment visits, patients cannot have anyone with them due to current Covid guidelines and our immunocompromised population.

## 2020-10-06 ENCOUNTER — Ambulatory Visit
Admission: RE | Admit: 2020-10-06 | Discharge: 2020-10-06 | Disposition: A | Discharge status: Home / Self Care | Payer: 59 | Source: Ambulatory Visit | Attending: Radiation Oncology | Admitting: Radiation Oncology

## 2020-10-06 ENCOUNTER — Other Ambulatory Visit: Payer: Self-pay

## 2020-10-06 ENCOUNTER — Encounter: Payer: Self-pay | Admitting: Radiation Oncology

## 2020-10-06 VITALS — BP 102/73 | HR 82 | Temp 97.6°F | Resp 18 | Ht 64.0 in | Wt 136.2 lb

## 2020-10-06 DIAGNOSIS — Z79899 Other long term (current) drug therapy: Secondary | ICD-10-CM | POA: Diagnosis not present

## 2020-10-06 DIAGNOSIS — C50411 Malignant neoplasm of upper-outer quadrant of right female breast: Secondary | ICD-10-CM

## 2020-10-06 DIAGNOSIS — Z803 Family history of malignant neoplasm of breast: Secondary | ICD-10-CM | POA: Diagnosis not present

## 2020-10-06 DIAGNOSIS — K219 Gastro-esophageal reflux disease without esophagitis: Secondary | ICD-10-CM | POA: Diagnosis not present

## 2020-10-06 DIAGNOSIS — Z170 Estrogen receptor positive status [ER+]: Secondary | ICD-10-CM | POA: Insufficient documentation

## 2020-10-06 NOTE — Progress Notes (Signed)
Patient reports LT axilla pain 3/10, and extreme fatigue. No other symptoms reported at this time.  Meaningful use complete.   BP 102/73 (BP Location: Left Arm, Patient Position: Sitting, Cuff Size: Normal)   Pulse 82   Temp 97.6 F (36.4 C)   Resp 18   Ht 5\' 4"  (1.626 m)   Wt 136 lb 3.2 oz (61.8 kg)   SpO2 100%   BMI 23.38 kg/m

## 2020-10-06 NOTE — Progress Notes (Signed)
Radiation Oncology         [phone] ________________________________  Name: Caitlyn Miller        MRN: [id number]  Date of Service: 10/06/2020 DOB: [birth year]  [id number] Claudie Leach, MD  Truitt Merle, MD     REFERRING PHYSICIAN: Truitt Merle, MD   DIAGNOSIS: The encounter diagnosis was Malignant neoplasm of upper-outer quadrant of right breast in female, estrogen receptor positive ([place]).   HISTORY OF PRESENT ILLNESS: Caitlyn Miller is a 35 y.o. female originally seen in the multidisciplinary breast clinic for a new diagnosis of right, node positive breast cancer. The patient was noted to have a palpable abnormality in the right breast.  She has a history of breast augmentation and presented on 11/05/2019 for diagnostic mammogram.  This revealed calcifications measuring 3.8 cm in greatest dimension, a targeted ultrasound was also performed revealing a mass within the upper outer right breast at the site of the palpable concern approximately at 11:00 there was a 4.4 x 1.4 x 1.4 cm definable mass and 2 adjacent cortically thickened lymph nodes in the right axilla.  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  Her lymph node was positive, and the tumor was ER/PR positive, HER-2 was negative with a Ki-67 of 5%.    Since her last visit she had negative staging imaging a low risk MammaPrint, and started neoadjuvant antiestrogen therapy.  She has been on Verzenio as well.  Additional imaging in February 2022 showed a response to treatment and she proceeded with right lumpectomy and sentinel lymph node biopsy on 04/15/2020 surgical pathology showed a larger than expected tumor measuring up to 3.1 cm with invasive and mixed ductal and lobular features positive margins and 2 of 3 sentinel lymph nodes were positive as well.  She proceeded with mastectomy and axillary lymph node dissection on 05/18/2020, this  revealed residual invasive mixed ductal and lobular features broadly involving the anterior inferior soft tissue margin and residual invasive disease was less than 1 mm from the deep margin, associated and in situ disease was 3 mm from the deep margin, her breast implant was removed at that time as well 2 of 12 lymph nodes were still involved with disease.  She began systemic chemotherapy on 06/24/2020 with poor tolerability her dose was switched to Taxol which she was able to complete on 09/30/20 .  She is seen today to discuss adjuvant radiotherapy.     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   Pelvic pain 09/2008       PAST SURGICAL HISTORY: Past Surgical History:  Procedure Laterality Date   AUGMENTATION MAMMAPLASTY Bilateral 0240   silicone  BREAST IMPLANT REMOVAL Right 05/18/2020   Procedure: REMOVAL OF RIGHT BREAST IMPLANT;  Surgeon: Donnie Mesa, MD;  Location: [place];  Service: General;  Laterality: Right;   BREAST LUMPECTOMY WITH RADIOACTIVE SEED LOCALIZATION Right 04/15/2020   Procedure: RIGHT BREAST LUMPECTOMY WITH RADIOACTIVE SEED LOCALIZATION;  Surgeon: Donnie Mesa, MD;  Location: [facility];  Service: General;  Laterality: Right;   MODIFIED MASTECTOMY Right 05/18/2020   Procedure: RIGHT MODIFIED RADICAL MASTECTOMY;  Surgeon: Donnie Mesa, MD;  Location: [facility];  Service:  General;  Laterality: Right;   PORTACATH PLACEMENT Left 06/10/2020   Procedure: INSERTION PORT-A-CATH;  Surgeon: Donnie Mesa, MD;  Location: [facility];  Service: General;  Laterality: Left;  45 MINUTES ROOM 2   RADIOACTIVE SEED [place] Right 04/15/2020   Procedure: RADIOACTIVE SEED GUIDED RIGHT AXILLARY SENTINEL LYMPH NODE DISSECTION;  Surgeon: Donnie Mesa, MD;  Location: [place];  Service: General;  Laterality: Right;   SENTINEL NODE BIOPSY N/A 04/15/2020   Procedure: SENTINEL NODE BIOPSY;  Surgeon: Donnie Mesa, MD;  Location: [facility];  Service: General;  Laterality: N/A;   WISDOM TOOTH EXTRACTION       FAMILY HISTORY:  Family History  Problem Relation Age of Onset   Migraines Mother    Hypertension Father    Breast cancer Paternal Aunt 69   Colon cancer Neg Hx    Stomach cancer Neg Hx      SOCIAL HISTORY:  reports that she has never smoked. She has never used smokeless tobacco. She reports that she does not drink alcohol and does not use drugs.  The patient is married and lives in [place].  She is a Engineer, maintenance. She has a two teenage children She's from [place].   ALLERGIES: Ibuprofen   MEDICATIONS:  Current Outpatient Medications  Medication Sig Dispense Refill   acetaminophen (TYLENOL) 500 MG tablet Take 500 mg by mouth every 6 (six) hours as needed for moderate pain.     clindamycin (CLINDAGEL) 1 % gel Apply topically 2 (two) times daily. 30 g 0   gabapentin (NEURONTIN) 100 MG capsule TAKE 2 CAPSULES(200 MG) BY MOUTH AT BEDTIME 60 capsule 1   hydrocortisone 1 % lotion Apply 1 application topically daily as needed (rash). 118 mL 0   lidocaine (LIDODERM) 5 % Place 1 patch onto the skin daily. Remove & Discard patch within 12 hours or as directed by MD 30 patch 0   lidocaine-prilocaine (EMLA) cream Apply to affected area once (Patient taking differently: Apply 1 application topically daily  as needed (port access).) 30 g 3   magnesium oxide (MAG-OX) 400 (240 Mg) MG tablet Take 1 tablet (400 mg total) by mouth daily. (Patient not taking: Reported on 07/22/2020) 20 tablet 0   ondansetron (ZOFRAN ODT) 8 MG disintegrating tablet Take 1 tablet (8 mg total) by mouth every 8 (eight) hours as needed for nausea or vomiting. 30 tablet 0   ondansetron (ZOFRAN) 8 MG tablet Take 1 tablet (8 mg total) by mouth every 8 (eight) hours as needed. Start on the third day after chemotherapy. 30 tablet 2   pantoprazole (PROTONIX) 40 MG tablet Take 1 tablet (40 mg total) by mouth daily. 30 tablet 3   prochlorperazine (COMPAZINE) 10 MG tablet TAKE 1 TABLET(10 MG) BY MOUTH EVERY 6 HOURS AS NEEDED FOR NAUSEA OR VOMITING 30 tablet 3   traMADol (ULTRAM) 50 MG tablet Take 1 tablet (50 mg total) by mouth every 6 (  six) hours as needed (mild pain). (Patient not taking: Reported on 07/22/2020) 30 tablet 0   venlafaxine (EFFEXOR) 37.5 MG tablet Take 1 tablet (37.5 mg total) by mouth daily. 30 tablet 30   No current facility-administered medications for this encounter.     REVIEW OF SYSTEMS: On review of systems, the patient reports that she is doing fairly well but is still very tired from chemotherapy. She is concerned about risks of lymphedema. No other concerns are verbalized.     PHYSICAL EXAM:  Wt Readings from Last 3 Encounters:  09/30/20 135 lb 14.4 oz (61.6 kg)  09/24/20 133 lb 12.8 oz (60.7 kg)  09/16/20 134 lb (60.8 kg)   Temp Readings from Last 3 Encounters:  09/30/20 98.1 F (36.7 C) (Oral)  09/24/20 98.2 F (36.8 C) (Oral)  09/16/20 98.6 F (37 C) (Oral)   BP Readings from Last 3 Encounters:  09/30/20 116/83  09/24/20 102/71  09/16/20 119/81   Pulse Readings from Last 3 Encounters:  09/30/20 88  09/24/20 88  09/16/20 95    In general this is a well appearing  female in no acute distress. She's alert and oriented x4 and appropriate throughout the examination. Cardiopulmonary assessment  is negative for acute distress and she exhibits normal effort. Her right chest wall has a well healed mastectomy and axillary incision site, both are intact. The skin is pliable and no fluid accumulation is noted. No RUE lymphedema is noted.   ECOG = 0  0 - Asymptomatic (Fully active, able to carry on all predisease activities without restriction)  1 - Symptomatic but completely ambulatory (Restricted in physically strenuous activity but ambulatory and able to carry out work of a light or sedentary nature. For example, light housework, office work)  2 - Symptomatic, <50% in bed during the day (Ambulatory and capable of all self care but unable to carry out any work activities. Up and about more than 50% of waking hours)  3 - Symptomatic, >50% in bed, but not bedbound (Capable of only limited self-care, confined to bed or chair 50% or more of waking hours)  4 - Bedbound (Completely disabled. Cannot carry on any self-care. Totally confined to bed or chair)  5 - Death   Eustace Pen MM, Creech RH, Tormey DC, et al. [phone]). "Toxicity and response criteria of the [facility]". [place] Oncol. 5 (6): 649-55    LABORATORY DATA:  Lab Results  Component Value Date   WBC 5.9 09/30/2020   HGB 10.0 (L) 09/30/2020   HCT 30.0 (L) 09/30/2020   MCV 96.2 09/30/2020   PLT 335 09/30/2020   Lab Results  Component Value Date   NA 139 09/30/2020   K 4.1 09/30/2020   CL 106 09/30/2020   CO2 25 09/30/2020   Lab Results  Component Value Date   ALT 130 (H) 09/30/2020   AST 60 (H) 09/30/2020   ALKPHOS 67 09/30/2020   BILITOT 0.4 09/30/2020      RADIOGRAPHY: No results found.      IMPRESSION/PLAN: 1. 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  Dr. Lisbeth Renshaw discusses the final surgical pathology findings and her course to date. He recommends adjuvant radiotherapy to the right chest wall as well as regional nodes.  She will also benefit from long term adjuvant antiestrogen therapy.  We discussed the risks, benefits, short, and long term effects of radiotherapy, and the patient is interested in proceeding.  Dr. Lisbeth Renshaw discusses the delivery and logistics of radiotherapy and anticipates a course of 6 1/2 weeks of radiotherapy. Written consent is obtained and placed in the chart, a copy was provided to the patient. She will receive a call from our staff to coordinate simulation. 2. Contraceptive Counseling. The patient will need pregnancy testing prior to simulation and will do a home pregnancy test the day prior or day of simulation. She was counseled on additional contraceptive efforts during radiotherapy with condoms. She is in agreement with this plan.  4. Social needs. The patient and her husband do not have family in this country. A letter was written in support of her family being able to travel to the [place] to help her during therapy.   In a visit lasting 45 minutes, greater than 50% of the time was spent face to face reviewing her case, as well as in preparation of, discussing, and coordinating the patient's care.  The above documentation reflects my direct findings during this shared patient visit. Please see the separate note by Dr. Lisbeth Renshaw on this date for the remainder of the patient's plan of care.    Carola Rhine, PAC

## 2020-10-08 ENCOUNTER — Encounter: Payer: Self-pay | Admitting: General Practice

## 2020-10-08 NOTE — Progress Notes (Signed)
[facility] Psychosocial Distress Screening Clinical Social Work  Clinical Social Work was referred by distress screening protocol.  The patient scored a 6 on the Psychosocial Distress Thermometer which indicates moderate distress. Clinical Social Worker contacted patient by phone to assess for distress and other psychosocial needs. Unable to reach patient, spoke w husband.  Reports she is doing very well, no needs at this time.  Hopeful that family will be able to come from [place] to support her during upcoming treatment.  Brief explanation of [place], encouraged reaching out if needs arise.  ONCBCN DISTRESS SCREENING 10/06/2020  Screening Type   Distress experienced in past week (1-10) 6  Practical problem type   Emotional problem type Adjusting to illness  Physical Problem type   Referral to support programs     Clinical Social Worker follow up needed: No.  If yes, follow up plan:  Beverely Pace, [place], LCSW Clinical Social Worker Phone:  [phone]

## 2020-10-13 ENCOUNTER — Other Ambulatory Visit: Payer: Self-pay

## 2020-10-13 ENCOUNTER — Encounter: Payer: Self-pay | Admitting: Physical Therapy

## 2020-10-13 ENCOUNTER — Ambulatory Visit: Payer: 59 | Admitting: Physical Therapy

## 2020-10-13 ENCOUNTER — Encounter: Payer: Self-pay | Admitting: *Deleted

## 2020-10-13 DIAGNOSIS — Z483 Aftercare following surgery for neoplasm: Secondary | ICD-10-CM | POA: Diagnosis not present

## 2020-10-13 DIAGNOSIS — Z170 Estrogen receptor positive status [ER+]: Secondary | ICD-10-CM

## 2020-10-13 DIAGNOSIS — M25611 Stiffness of right shoulder, not elsewhere classified: Secondary | ICD-10-CM

## 2020-10-13 DIAGNOSIS — R293 Abnormal posture: Secondary | ICD-10-CM

## 2020-10-13 NOTE — Therapy (Signed)
[facility] [place], [place], [address] Phone: [phone]   Fax:  [phone]  Physical Therapy Treatment  Patient Details  Name: Caitlyn Miller MRN: [id number] Date of Birth: 04-Jul-1985 Referring Provider (PT): Dr. Rodman Key Tsuei/Feng   Encounter Date: 10/13/2020   PT End of Session - 10/13/20 1054     Visit Number 9    Number of Visits 12    Date for PT Re-Evaluation 11/24/20    PT Start Time 1006    PT Stop Time 1053    PT Time Calculation (min) 47 min    Activity Tolerance Patient tolerated treatment well    Behavior During Therapy [facility] for tasks assessed/performed             Past Medical History:  Diagnosis Date   Abnormal Pap smear    Acid reflux    Anemia    Anxiety    Breast cancer ([place])    Decreased appetite 10/08   Depression    Endometriosis 10/2004   Epigastric pain 10/2006   Family history of breast cancer 11/19/2019   FH: migraines    GBS carrier    H/O amenorrhea 06/2006   H/O dyspareunia 7/07   H/O fatigue    H/O nausea and vomiting 10/2006   H/O rubella    H/O varicella    Hyperemesis arising during pregnancy    First pregnancy   Irregular periods/menstrual cycles 02/8766   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   Pelvic pain 09/2008    Past Surgical History:  Procedure Laterality Date   AUGMENTATION MAMMAPLASTY Bilateral 2035   silicone    BREAST IMPLANT REMOVAL Right 05/18/2020   Procedure: REMOVAL OF RIGHT BREAST IMPLANT;  Surgeon: Donnie Mesa, MD;  Location: [place];  Service: General;  Laterality: Right;   BREAST LUMPECTOMY WITH RADIOACTIVE SEED LOCALIZATION Right 04/15/2020   Procedure: RIGHT BREAST LUMPECTOMY WITH RADIOACTIVE SEED LOCALIZATION;  Surgeon: Donnie Mesa, MD;  Location: [facility];  Service: General;  Laterality: Right;   MODIFIED MASTECTOMY Right 05/18/2020   Procedure: RIGHT MODIFIED RADICAL MASTECTOMY;  Surgeon: Donnie Mesa,  MD;  Location: [place];  Service: General;  Laterality: Right;   PORTACATH PLACEMENT Left 06/10/2020   Procedure: INSERTION PORT-A-CATH;  Surgeon: Donnie Mesa, MD;  Location: [place];  Service: General;  Laterality: Left;  45 MINUTES ROOM 2   RADIOACTIVE SEED GUIDED AXILLARY SENTINEL LYMPH NODE Right 04/15/2020   Procedure: RADIOACTIVE SEED GUIDED RIGHT AXILLARY SENTINEL LYMPH NODE DISSECTION;  Surgeon: Donnie Mesa, MD;  Location: [facility];  Service: General;  Laterality: Right;   SENTINEL NODE BIOPSY N/A 04/15/2020   Procedure: SENTINEL NODE BIOPSY;  Surgeon: Donnie Mesa, MD;  Location: [facility];  Service: General;  Laterality: N/A;   WISDOM TOOTH EXTRACTION      There were no vitals filed for this visit.   Subjective Assessment - 10/13/20 1017     Subjective I bought pulleys to use at home. The pain has moved and is now in the top of my shoulder and I am still having trouble with the cording. I have completed chemo and will begin radiation on the 10th.    Pertinent History Patient was diagnosed on 11/05/2019 with right grade II invasive ductal carcinoma breast cancer. It measures 4.7 cm and is located in the upper outer quadrant. It is ER/PR positive and HER2 negative with a Ki67 of 5%. She has a biopsied positive axillary  lymph node. She had a Right lumpectomy with SLNB (2/3+) performed on 04/15/2020,  right modified mastectomy ALND (2/12), currently doing chemo and will need radiation    Patient Stated Goals Get back to normal use of right UE    Currently in Pain? Yes    Pain Score 5     Pain Location Axilla    Pain Orientation Right    Pain Descriptors / Indicators Tightness;Stabbing    Pain Type Acute pain    Pain Onset In the past 7 days    Pain Frequency Intermittent    Aggravating Factors  nothing    Pain Relieving Factors nothing    Effect of Pain on Daily Activities no effect                                [facility] Adult PT Treatment/Exercise - 10/13/20 0001       Manual Therapy   Soft tissue mobilization with cocoa butter in left sidelying to upper traps, levator, infraspinatus, serratus, lats, rhomboids, paraspinals and teres with numerous areas of tightness noted especially in upper traps and lats that improved minimally today    Myofascial Release to cording in R axilla with 1 cord palpable today                          PT Long Term Goals - 09/01/20 1007       PT LONG TERM GOAL #1   Title Patient will demonstrate she has regained near  full shoulder ROM and function post operatively compared to baselines.    Baseline End ROM improved but still limited by cording- 05/13/20; 07/15/20- pt limited at end range by tightness though ROM has improved; 09/01/20- pt is no longer limited at end range    Time 6    Period Weeks    Status Achieved      PT LONG TERM GOAL #2   Title Pt will have decreased pain by atleast 50%    Baseline Pt reports no pain at this time as motion improves-05/13/20    Time 3    Period Weeks    Status Achieved      PT LONG TERM GOAL #3   Title Pt will have quick dash no greater than 20% for return to function prior to next surgery    Baseline 70%; pt did not retake this today but reports no longer feeling limited with reaching and ADLs-05/13/20; 09/01/20- 13.64    Time 3    Period Weeks    Status Achieved      PT LONG TERM GOAL #4   Title pt will be able to dress, bathe and perform home activities with improved ease    Time 3    Period Weeks    Status Achieved      PT LONG TERM GOAL #5   Title Pt will be independent in a home exercise program for continued strengthening and stretching so she can avoid increased tightness with radiation    Time 6    Period Weeks    Status On-going                   Plan - 10/13/20 1058     Clinical Impression Statement Pt is now having increased pain at superior  shoulder in area of upper traps and levator. Increased muscle tightness palpable in this area as well as  lats. Performed soft tissue mobilization to entire posterior shoulder with focus on lats and upper traps/levator with minimal improvement noted. Educated pt about purchasing a Production assistant, radio to use at home in between therapy sessions to see if this helps. Also worked on myofascial release to cording in R axilla. Pt is about to begin radiation on Oct 10th. Educated pt to continue with stretches and massage at home as radiation tends to make things more tight.    PT Frequency Biweekly    PT Duration 12 weeks    PT Treatment/Interventions ADLs/Self Care Home Management;Therapeutic exercise;Patient/family education;Manual techniques;Manual lymph drainage;Scar mobilization;Passive range of motion;Orthotic Fit/Training    PT Next Visit Plan MFR to cording in R axilla and anterior chest, see how R infraspinatus is,and instructing in an HEP for R shoulder ROM and strength, instruct in Strength ABC - pt to begin radiation on oct 10    PT Home Exercise Plan Post op shoulder ROM HEP (supine for flex and stargazer), supine dowel flex and abd    Consulted and Agree with Plan of Care Patient             Patient will benefit from skilled therapeutic intervention in order to improve the following deficits and impairments:  Postural dysfunction, Decreased range of motion, Impaired UE functional use, Pain, Decreased knowledge of precautions, Decreased activity tolerance, Impaired sensation, Decreased strength, Increased fascial restricitons  Visit Diagnosis: Aftercare following surgery for neoplasm  Stiffness of right shoulder, not elsewhere classified  Abnormal posture  Malignant neoplasm of upper-outer quadrant of right breast in female, estrogen receptor positive ([place])     Problem List Patient Active Problem List   Diagnosis Date Noted   Port-A-Cath in place 09/24/2020   Menopausal syndrome (hot  flushes) 73/56/7014   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

## 2020-10-15 ENCOUNTER — Other Ambulatory Visit: Payer: Self-pay

## 2020-10-15 ENCOUNTER — Ambulatory Visit
Admission: RE | Admit: 2020-10-15 | Discharge: 2020-10-15 | Disposition: A | Discharge status: Home / Self Care | Payer: 59 | Source: Ambulatory Visit | Attending: Radiation Oncology | Admitting: Radiation Oncology

## 2020-10-15 DIAGNOSIS — C50411 Malignant neoplasm of upper-outer quadrant of right female breast: Secondary | ICD-10-CM | POA: Insufficient documentation

## 2020-10-20 ENCOUNTER — Other Ambulatory Visit: Payer: Self-pay

## 2020-10-20 ENCOUNTER — Ambulatory Visit: Payer: 59 | Admitting: Physical Therapy

## 2020-10-20 ENCOUNTER — Encounter: Payer: Self-pay | Admitting: Physical Therapy

## 2020-10-20 DIAGNOSIS — M25511 Pain in right shoulder: Secondary | ICD-10-CM

## 2020-10-20 DIAGNOSIS — Z452 Encounter for adjustment and management of vascular access device: Secondary | ICD-10-CM | POA: Diagnosis not present

## 2020-10-20 DIAGNOSIS — C50411 Malignant neoplasm of upper-outer quadrant of right female breast: Secondary | ICD-10-CM | POA: Insufficient documentation

## 2020-10-20 DIAGNOSIS — I972 Postmastectomy lymphedema syndrome: Secondary | ICD-10-CM | POA: Insufficient documentation

## 2020-10-20 DIAGNOSIS — Z510 Encounter for antineoplastic radiation therapy: Secondary | ICD-10-CM | POA: Diagnosis not present

## 2020-10-20 DIAGNOSIS — M25611 Stiffness of right shoulder, not elsewhere classified: Secondary | ICD-10-CM

## 2020-10-20 DIAGNOSIS — Z170 Estrogen receptor positive status [ER+]: Secondary | ICD-10-CM

## 2020-10-20 DIAGNOSIS — R293 Abnormal posture: Secondary | ICD-10-CM

## 2020-10-20 DIAGNOSIS — Z483 Aftercare following surgery for neoplasm: Secondary | ICD-10-CM | POA: Insufficient documentation

## 2020-10-20 NOTE — Therapy (Signed)
[facility] [place], [place], [address] Phone:     Fax:     Physical Therapy Treatment  Patient Details  Name: Caitlyn Miller MRN: [id number] Date of Birth: [birth year] Referring Provider (PT): Dr. Rodman Key Tsuei/Feng   Encounter Date: 10/20/2020   PT End of Session - 10/20/20 1705     Visit Number 10    Number of Visits 18    Date for PT Re-Evaluation 12/15/20    PT Start Time 6734    PT Stop Time 1658    PT Time Calculation (min) 50 min    Activity Tolerance Patient tolerated treatment well    Behavior During Therapy [facility] for tasks assessed/performed             Past Medical History:  Diagnosis Date   Abnormal Pap smear    Acid reflux    Anemia    Anxiety    Breast cancer ([place])    Decreased appetite 10/08   Depression    Endometriosis 10/2004   Epigastric pain 10/2006   Family history of breast cancer 11/19/2019   FH: migraines    GBS carrier    H/O amenorrhea 06/2006   H/O dyspareunia 7/07   H/O fatigue    H/O nausea and vomiting 10/2006   H/O rubella    H/O varicella    Hyperemesis arising during pregnancy    First pregnancy   Irregular periods/menstrual cycles 01/9377   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   Pelvic pain 09/2008    Past Surgical History:  Procedure Laterality Date   AUGMENTATION MAMMAPLASTY Bilateral 3532   silicone    BREAST IMPLANT REMOVAL Right 05/18/2020   Procedure: REMOVAL OF RIGHT BREAST IMPLANT;  Surgeon: Donnie Mesa, MD;  Location: [place];  Service: General;  Laterality: Right;   BREAST LUMPECTOMY WITH RADIOACTIVE SEED LOCALIZATION Right 04/15/2020   Procedure: RIGHT BREAST LUMPECTOMY WITH RADIOACTIVE SEED LOCALIZATION;  Surgeon: Donnie Mesa, MD;  Location: [place];  Service: General;  Laterality: Right;   MODIFIED MASTECTOMY Right 05/18/2020   Procedure: RIGHT MODIFIED RADICAL MASTECTOMY;  Surgeon: Donnie Mesa, MD;  Location: [place];  Service: General;  Laterality: Right;   PORTACATH PLACEMENT Left 06/10/2020   Procedure: INSERTION PORT-A-CATH;  Surgeon: Donnie Mesa, MD;  Location: [place];  Service: General;  Laterality: Left;  45 MINUTES ROOM 2   RADIOACTIVE SEED GUIDED AXILLARY SENTINEL LYMPH NODE Right 04/15/2020   Procedure: RADIOACTIVE SEED GUIDED RIGHT AXILLARY SENTINEL LYMPH NODE DISSECTION;  Surgeon: Donnie Mesa, MD;  Location: [place];  Service: General;  Laterality: Right;   SENTINEL NODE BIOPSY N/A 04/15/2020   Procedure: SENTINEL NODE BIOPSY;  Surgeon: Donnie Mesa, MD;  Location: [place];  Service: General;  Laterality: N/A;   WISDOM TOOTH EXTRACTION      There were no vitals filed for this visit.   Subjective Assessment - 10/20/20 1610     Subjective I started having swelling in my finger the day after I came to therapy. The shoulder pain is getting better.    Pertinent History Patient was diagnosed on 11/05/2019 with right grade II invasive ductal carcinoma breast cancer. It measures 4.7 cm and is located in the upper outer quadrant. It is ER/PR positive and HER2 negative with a Ki67 of 5%. She has a biopsied positive axillary lymph node. She had a Right lumpectomy with SLNB (2/3+) performed on 04/15/2020,  right modified mastectomy ALND (2/12), currently doing chemo and will need radiation    Patient Stated Goals Get back to normal use of right UE    Currently in Pain? Yes    Pain Score 3     Pain Location Shoulder    Pain Orientation Right;Posterior    Pain Descriptors / Indicators Aching    Pain Type Acute pain    Pain Onset 1 to 4 weeks ago    Pain Frequency Intermittent    Aggravating Factors  cutting veggies    Pain Relieving Factors massage    Effect of Pain on Daily Activities no effect                   LYMPHEDEMA/ONCOLOGY QUESTIONNAIRE - 10/20/20 0001       Right Upper Extremity Lymphedema   10 cm Proximal to  Olecranon Process 25.5 cm    Olecranon Process 24.5 cm    10 cm Proximal to Ulnar Styloid Process 20 cm    Just Proximal to Ulnar Styloid Process 15 cm    Across Hand at [employer] 19.8 cm    At [place] of 2nd Digit 6.5 cm      Left Upper Extremity Lymphedema   10 cm Proximal to Olecranon Process 24.5 cm    Olecranon Process 24.2 cm    10 cm Proximal to Ulnar Styloid Process 20 cm    Just Proximal to Ulnar Styloid Process 15 cm    Across Hand at [employer] 19 cm    At [place] of 2nd Digit 6 cm             L-DEX FLOWSHEETS - 10/20/20 1600       L-DEX LYMPHEDEMA SCREENING   Measurement Type Unilateral    L-DEX MEASUREMENT EXTREMITY Upper Extremity    POSITION  Standing    DOMINANT SIDE Right    At Risk Side Right    BASELINE SCORE (UNILATERAL) 4.2    L-DEX SCORE (UNILATERAL) 9.7    VALUE CHANGE (UNILAT) 5.5                       [facility] Adult PT Treatment/Exercise - 10/20/20 0001       Manual Therapy   Manual Therapy Manual Lymphatic Drainage (MLD);Edema management    Edema Management measured pt for an exo strong glove and issued info on how to obtain    Manual Lymphatic Drainage (MLD) in supine: short neck, 5 diaphramatic breaths, left axillary nodes and establishment of interaxillary pathway, right inguinal nodes and establishment of axillo inguinal pathway, R UE working proximal to distal then retracing all steps while educating pt about anatomy and physiology of the lymphatic system and basic principles of self MLD                     PT Education - 10/20/20 1713     Education Details anatomy and physiology of lymphedema, subclinical lymphedema vs lymphedema    Person(s) Educated Patient    Methods Explanation    Comprehension Verbalized understanding                 PT Long Term Goals - 10/20/20 1706       PT LONG TERM GOAL #1   Title Patient will demonstrate she has regained near  full shoulder ROM and function post  operatively compared to baselines.    Baseline End ROM improved but still limited by  cording- 05/13/20; 07/15/20- pt limited at end range by tightness though ROM has improved; 09/01/20- pt is no longer limited at end range    Time 6    Period Weeks    Status Achieved      PT LONG TERM GOAL #2   Title Pt will have decreased pain by atleast 50%    Baseline Pt reports no pain at this time as motion improves-05/13/20    Time 3    Period Weeks    Status Achieved      PT LONG TERM GOAL #3   Title Pt will have quick dash no greater than 20% for return to function prior to next surgery    Baseline 70%; pt did not retake this today but reports no longer feeling limited with reaching and ADLs-05/13/20; 09/01/20- 13.64    Time 3    Period Weeks    Status Achieved      PT LONG TERM GOAL #4   Title pt will be able to dress, bathe and perform home activities with improved ease    Time 3    Period Weeks    Status Achieved      PT LONG TERM GOAL #5   Title Pt will be independent in a home exercise program for continued strengthening and stretching so she can avoid increased tightness with radiation    Time 6    Period Weeks    Status On-going      Additional Long Term Goals   Additional Long Term Goals Yes      PT LONG TERM GOAL #6   Title Pt will be independent with self MLD for long term management of lymphedema.    Time 8    Period Weeks    Status New    Target Date 12/15/20      PT LONG TERM GOAL #7   Title Pt will obtain appropriate compression garments for management of lymphedema.    Time 8    Period Weeks    Status New    Target Date 12/15/20      PT LONG TERM GOAL #8   Title Pt will report no shoulder pain during the day to allow her to return to PLOF.    Time 8    Period Weeks    Status New    Target Date 12/15/20                   Plan - 10/20/20 1708     Clinical Impression Statement Pt returns to PT with recent onset of right hand and finger swelling. Her  2nd digit is most swollen but there is swelling visible in her hand and in to her forearm. Remeasured her SOZO today and she had increased 5.5 from baseline. Pt already has a compression sleeve at home so educated pt to begin wearing this 12 hours a day for the next 4 weeks. She only has a gauntlet so since she does not have a glove, measured pt for a glove and issued instructions on how to obtain one. Updated goals and added goals as appropriate to address new onset of swelling. Pt would benefit from additional skilled PT services to decrease R UE swelling, decrease R shoulder pain, instruct pt with an HEP and assist her with obtaining appropriate compression garments. Pt is beginning radiation next week which could worsen her swelling.    PT Frequency 1x / week    PT Duration 8 weeks  PT Treatment/Interventions ADLs/Self Care Home Management;Therapeutic exercise;Patient/family education;Manual techniques;Manual lymph drainage;Scar mobilization;Passive range of motion;Orthotic Fit/Training;Compression bandaging    PT Next Visit Plan instruct in MLD to RUE and issue handout, re do SOZO week of Nov 2, MFR to cording in R axilla and anterior chest, see how R infraspinatus is,and instructing in an HEP for R shoulder ROM and strength, instruct in Strength ABC - pt to begin radiation on oct 10    PT Home Exercise Plan Post op shoulder ROM HEP (supine for flex and stargazer), supine dowel flex and abd    Consulted and Agree with Plan of Care Patient             Patient will benefit from skilled therapeutic intervention in order to improve the following deficits and impairments:  Postural dysfunction, Decreased range of motion, Impaired UE functional use, Pain, Decreased knowledge of precautions, Decreased activity tolerance, Impaired sensation, Decreased strength, Increased fascial restricitons  Visit Diagnosis: Postmastectomy lymphedema  Stiffness of right shoulder, not elsewhere classified  Acute  pain of right shoulder  Aftercare following surgery for neoplasm  Abnormal posture  Malignant neoplasm of upper-outer quadrant of right breast in female, estrogen receptor positive ([place])     Problem List Patient Active Problem List   Diagnosis Date Noted   Port-A-Cath in place 09/24/2020   Menopausal syndrome (hot flushes) 47/09/6281   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, [place], [address] Phone:     Fax:     Name: Sama Arauz MRN: [id number] Date of Birth: 05/08/1985   Manus Gunning, PT 10/20/20 5:14 PM

## 2020-10-21 DIAGNOSIS — C50411 Malignant neoplasm of upper-outer quadrant of right female breast: Secondary | ICD-10-CM | POA: Insufficient documentation

## 2020-10-21 DIAGNOSIS — Z510 Encounter for antineoplastic radiation therapy: Secondary | ICD-10-CM | POA: Diagnosis not present

## 2020-10-21 DIAGNOSIS — Z170 Estrogen receptor positive status [ER+]: Secondary | ICD-10-CM | POA: Insufficient documentation

## 2020-10-25 ENCOUNTER — Other Ambulatory Visit: Payer: Self-pay

## 2020-10-25 ENCOUNTER — Ambulatory Visit
Admission: RE | Admit: 2020-10-25 | Discharge: 2020-10-25 | Disposition: A | Discharge status: Home / Self Care | Payer: 59 | Source: Ambulatory Visit | Attending: Radiation Oncology | Admitting: Radiation Oncology

## 2020-10-25 DIAGNOSIS — Z510 Encounter for antineoplastic radiation therapy: Secondary | ICD-10-CM | POA: Diagnosis not present

## 2020-10-26 ENCOUNTER — Ambulatory Visit
Admission: RE | Admit: 2020-10-26 | Discharge: 2020-10-26 | Disposition: A | Discharge status: Home / Self Care | Payer: 59 | Source: Ambulatory Visit | Attending: Radiation Oncology | Admitting: Radiation Oncology

## 2020-10-26 DIAGNOSIS — Z510 Encounter for antineoplastic radiation therapy: Secondary | ICD-10-CM | POA: Diagnosis not present

## 2020-10-27 ENCOUNTER — Ambulatory Visit
Admission: RE | Admit: 2020-10-27 | Discharge: 2020-10-27 | Disposition: A | Discharge status: Home / Self Care | Payer: 59 | Source: Ambulatory Visit | Attending: Radiation Oncology | Admitting: Radiation Oncology

## 2020-10-27 ENCOUNTER — Encounter: Payer: 59 | Admitting: Physical Therapy

## 2020-10-27 ENCOUNTER — Other Ambulatory Visit: Payer: Self-pay

## 2020-10-27 DIAGNOSIS — Z510 Encounter for antineoplastic radiation therapy: Secondary | ICD-10-CM | POA: Diagnosis not present

## 2020-10-28 ENCOUNTER — Ambulatory Visit
Admission: RE | Admit: 2020-10-28 | Discharge: 2020-10-28 | Disposition: A | Discharge status: Home / Self Care | Payer: 59 | Source: Ambulatory Visit | Attending: Radiation Oncology | Admitting: Radiation Oncology

## 2020-10-28 DIAGNOSIS — Z510 Encounter for antineoplastic radiation therapy: Secondary | ICD-10-CM | POA: Diagnosis not present

## 2020-10-28 NOTE — Progress Notes (Signed)

## 2020-10-29 ENCOUNTER — Inpatient Hospital Stay: Payer: 59 | Attending: Hematology

## 2020-10-29 ENCOUNTER — Inpatient Hospital Stay (HOSPITAL_BASED_OUTPATIENT_CLINIC_OR_DEPARTMENT_OTHER): Payer: 59 | Admitting: Hematology

## 2020-10-29 ENCOUNTER — Ambulatory Visit
Admission: RE | Admit: 2020-10-29 | Discharge: 2020-10-29 | Disposition: A | Discharge status: Home / Self Care | Payer: 59 | Source: Ambulatory Visit | Attending: Radiation Oncology | Admitting: Radiation Oncology

## 2020-10-29 ENCOUNTER — Other Ambulatory Visit: Payer: Self-pay

## 2020-10-29 VITALS — BP 136/92 | HR 74 | Temp 98.7°F | Resp 18 | Ht 64.0 in | Wt 138.2 lb

## 2020-10-29 DIAGNOSIS — Z170 Estrogen receptor positive status [ER+]: Secondary | ICD-10-CM

## 2020-10-29 DIAGNOSIS — Z95828 Presence of other vascular implants and grafts: Secondary | ICD-10-CM

## 2020-10-29 DIAGNOSIS — Z510 Encounter for antineoplastic radiation therapy: Secondary | ICD-10-CM | POA: Diagnosis not present

## 2020-10-29 DIAGNOSIS — C50411 Malignant neoplasm of upper-outer quadrant of right female breast: Secondary | ICD-10-CM | POA: Diagnosis not present

## 2020-10-29 DIAGNOSIS — Z452 Encounter for adjustment and management of vascular access device: Secondary | ICD-10-CM | POA: Insufficient documentation

## 2020-10-29 LAB — CBC WITH DIFFERENTIAL (CANCER CENTER ONLY)
Abs Immature Granulocytes: 0.01 10*3/uL (ref 0.00–0.07)
Basophils Absolute: 0 10*3/uL (ref 0.0–0.1)
Basophils Relative: 1 %
Eosinophils Absolute: 0.1 10*3/uL (ref 0.0–0.5)
Eosinophils Relative: 1 %
HCT: 31.4 % — ABNORMAL LOW (ref 36.0–46.0)
Hemoglobin: 10.5 g/dL — ABNORMAL LOW (ref 12.0–15.0)
Immature Granulocytes: 0 %
Lymphocytes Relative: 25 %
Lymphs Abs: 1.5 10*3/uL (ref 0.7–4.0)
MCH: 31.4 pg (ref 26.0–34.0)
MCHC: 33.4 g/dL (ref 30.0–36.0)
MCV: 94 fL (ref 80.0–100.0)
Monocytes Absolute: 0.5 10*3/uL (ref 0.1–1.0)
Monocytes Relative: 8 %
Neutro Abs: 3.9 10*3/uL (ref 1.7–7.7)
Neutrophils Relative %: 65 %
Platelet Count: 273 10*3/uL (ref 150–400)
RBC: 3.34 MIL/uL — ABNORMAL LOW (ref 3.87–5.11)
RDW: 13.2 % (ref 11.5–15.5)
WBC Count: 5.9 10*3/uL (ref 4.0–10.5)
nRBC: 0 % (ref 0.0–0.2)

## 2020-10-29 LAB — CMP (CANCER CENTER ONLY)
ALT: 27 U/L (ref 0–44)
AST: 30 U/L (ref 15–41)
Albumin: 4.1 g/dL (ref 3.5–5.0)
Alkaline Phosphatase: 55 U/L (ref 38–126)
Anion gap: 8 (ref 5–15)
BUN: 11 mg/dL (ref 6–20)
CO2: 27 mmol/L (ref 22–32)
Calcium: 9.1 mg/dL (ref 8.9–10.3)
Chloride: 104 mmol/L (ref 98–111)
Creatinine: 0.62 mg/dL (ref 0.44–1.00)
GFR, Estimated: >60 mL/min (ref >60)
Glucose, Bld: 107 mg/dL — ABNORMAL HIGH (ref 70–99)
Potassium: 3.9 mmol/L (ref 3.5–5.1)
Sodium: 139 mmol/L (ref 135–145)
Total Bilirubin: 0.4 mg/dL (ref 0.3–1.2)
Total Protein: 7.2 g/dL (ref 6.5–8.1)

## 2020-10-29 MED ORDER — HEPARIN SOD (PORK) LOCK FLUSH 100 UNIT/ML IV SOLN
500.0000 [IU] | Freq: Once | INTRAVENOUS | Status: AC
  Administered 2020-10-29: 500 [IU]

## 2020-10-29 MED ORDER — RADIAPLEXRX EX GEL
Freq: Once | CUTANEOUS | Status: AC
Start: 2020-10-29 — End: 2020-10-29

## 2020-10-29 MED ORDER — SODIUM CHLORIDE 0.9% FLUSH
10.0000 mL | Freq: Once | INTRAVENOUS | Status: AC
  Administered 2020-10-29: 10 mL

## 2020-10-29 MED ORDER — ALRA NON-METALLIC DEODORANT (RAD-ONC)
1.0000 "application " | Freq: Once | TOPICAL | Status: AC
  Administered 2020-10-29: 1 via TOPICAL

## 2020-10-29 NOTE — Progress Notes (Signed)
[place]   [phone] [phone]   Clinic Follow up Note   Patient Care Team: [name], Lilia Argue, MD as PCP - General (Family Medicine) Mansouraty, Telford Nab., MD as Consulting Physician (Gastroenterology) Mauro Kaufmann, RN as Oncology Nurse Navigator Rockwell Germany, RN as Oncology Nurse Navigator Donnie Mesa, MD as Consulting Physician (General Surgery) Truitt Merle, MD as Consulting Physician (Hematology) Kyung Rudd, MD as Consulting Physician (Radiation Oncology) Contogiannis, Audrea Muscat, MD as Consulting Physician (Plastic Surgery)  Date of Service:  10/29/2020  CHIEF COMPLAINT: f/u of right breast cancer  CURRENT THERAPY:  Adjuvant radiation therapy, started 10/25/20  ASSESSMENT & PLAN:  Caitlyn Miller is a 35 y.o. female with   1. Malignant neoplasm of upper-outer quadrant of right breast, Stage IIA, p(T2N1aM0), ER+/PR+/HER2-, Grade II, Mammaprint luminal type A, low risk -She has a 4.7cm right breast mass at 11:00 position and two abnormal right axillary LN. 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 -Her 11/2019 breast MRI and PET scan did not indicate left breast or distant malignancy or metastasis.  -Mammaprint showed low risk disease and neoadjuvant chemotherapy is not recommended.  -She started neoadjuvant tamoxifen on 12/01/19. She tolerated poorly with nausea, and diffuse body aches. Dose reduced to 55m from 12/09/19 then switched to anastrozole on 01/06/20. I started her on monthly Zoladex injection on 12/09/19. -I started her on Verzenio on 01/12/2020, dose reduced to 535mam and 10030m/11/2020 due to poor tolerance. Held Verzenio on 03/25/20 to proceed with breast surgery. -Given her 03/01/20 US [place] mammogram indicated good response to treatment, she proceeded with right lumpectomy and sentinel LN dissection on 04/15/20 with Dr  TsuGeorgette Doverurgical pathology showed larger than expected tumor at 3.1cm of invasive carcinoma with mixed ductal and lobular features and DCIS components. She also had positive margins and 2/3 positive LNs. -She underwent mastectomy and axillary node dissection on 05/18/20 showing: residual invasive carcinoma and DCIS. She again had a positive margin and another 2/12 positive nodes. She had stage III disease.  -chest CT 06/17/20 and CT AP 07/02/20 were negative. -She had first cycle AC on 06/24/20 and tolerated very poorly with a variety of symptoms causing 2 ED visits. -We moved to weekly Taxol on 07/08/20. She is tolerating it better. She is tolerating less well as treatment has gone on, but she insisted on finishing. Today is her last treatment, and I will reduce her dose slightly due to fatigue and mild neuropathy. -she started adjuvant RT on 10/25/20, developed severe chest pain which she had similar episodes before with negative work up -continue RT -plan to see her back after RT to restart tamoxifen, she is not interested in AI and Verzenio due to poor tolerance.   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 She is on gabapentin BID at night (did not tolerate higher dose) -her neuropathy is improving off treatment.   3. Transaminitis; Digestive issues, reflux -Developed after chemo Taxol, likely secondary to chemotherapy -Ultrasound of liver 07/29/20 was unremarkable -She knows to avoid Tylenol, she does not drink alcohol -Her most recent LFT from 09/09/20 were improved. -Continue monitoring closely, will continue Taxol if AST and ALT less than 500, with normal bilirubin  -She is on pantoprazole. Will continue   4. Generalized Weakness, body pain, Fatigue, hot flushes  -Pt notes for the past 3+  years she has had mid chest and mid back pain. Previously intermittent, now constant. She also notes b/l arm and leg weakness and pain -This  has lead to fatigue, low appetite, decreased daily function/activity. -Pt notes prior workup for this in [place] was negative with Neurologist and cardiologist. They said this is related to stress, anxiety or depression. Pt feels she has an illness causing this instead. -Her 11/25/19 PET was negative for any distant malignancy or metastasis.  -will increase her Effexor for her worsening hot flashes -her chest pain has worsened since starting radiation. I think her pain is neuropathic,  will review her case with Dr. Mickeal Skinner and see what he recommends.   5. Anxiety, depression -She was previously on Lexapro.  I switched her to Effexor, she is tolerating better     6. 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   -She has discussed with our genetic consoler    7. Social Support -She is from [place] (her first language is [place]) and lives both there and in [place] with her husband. She has 2 teenage children. The rest of her family is in [place].  -She notes her mother is living with her now. Her mother is able to help her, especially with her kids.     PLAN:  -continue adjuvant RT  -I will review her pain concerns with Dr. Mickeal Skinner to see if referral is appropriate  -labs, flush, and f/u first week of 12/2020 to restart Tamoxifen    No problem-specific Assessment & Plan notes found for this encounter.   SUMMARY OF ONCOLOGIC HISTORY: Oncology History Overview Note  Cancer Staging Malignant neoplasm of upper-outer quadrant of right breast in female, estrogen receptor positive ([place]) Staging form: Breast, AJCC 8th Edition - Clinical stage from 11/12/2019: Stage IIA (cT2, cN1, cM0, G2, ER+, PR+, HER2-) - Signed by Truitt Merle, MD on 11/19/2019 Stage prefix: Initial diagnosis Histologic grading system: 3 grade system - Pathologic stage from 04/15/2020: No Stage Recommended (ypT2, pN2a,  cM0, G2, ER+, PR+, HER2-) - Signed by Truitt Merle, MD on 05/27/2020 Stage prefix: Post-therapy Histologic grading system: 3 grade system Residual tumor (R): R1 - Microscopic    Malignant neoplasm of upper-outer quadrant of right breast in female, estrogen receptor positive ([place])  11/05/2019 Mammogram   IMPRESSION: Large irregular palpable mass 4.7 x 1.4 x 1.4 cm within the upper-outer right breast 11 o'clock position, 4 cm from nipple.  There is a large area of associated coarse heterogeneous calcifications within the mass. Overall findings are concerning for breast carcinoma.   Two cortically thickened right axillary lymph nodes which are indeterminate in etiology.   11/12/2019 Cancer Staging   Staging form: Breast, AJCC 8th Edition - Clinical stage from 11/12/2019: Stage IIA (cT2, cN1, cM0, G2, ER+, PR+, HER2-) - Signed by Truitt Merle, MD on 11/19/2019   11/12/2019 Initial Biopsy   Diagnosis 1. Breast, right, needle core biopsy, right - INVASIVE MAMMARY CARCINOMA, SEE COMMENT. - MAMMARY CARCINOMA IN SITU. 2. Lymph node, needle/core biopsy, right - METASTATIC MAMMARY CARCINOMA. Microscopic Comment 1. The carcinoma appears grade 2 and measures 16 mm in greatest linear extent. E-cadherin will be ordered. Prognostic makers will be ordered. Dr. Saralyn Pilar has reviewed the case. The case was called to [facility] on 01/13/2020.    1. E-cadherin is strongly positive consistent with a ductal phenotype.   11/12/2019 Receptors her2   1. PROGNOSTIC INDICATORS  Results: IMMUNOHISTOCHEMICAL AND MORPHOMETRIC ANALYSIS PERFORMED MANUALLY The tumor cells are NEGATIVE for Her2 (0). Estrogen Receptor: 100%, POSITIVE, STRONG STAINING INTENSITY Progesterone Receptor: 100%, POSITIVE, STRONG STAINING INTENSITY Proliferation Marker Ki67: 5%   11/17/2019 Initial Diagnosis   Malignant neoplasm of upper-outer quadrant of right breast in female, estrogen receptor positive ([place])    11/25/2019 Breast MRI   IMPRESSION: 1. Large area of non-mass enhancement involving the UPPER OUTER QUADRANT of the RIGHT breast measuring approximately 4.2 x 4.1 x 2.4 cm. The biopsy-proven IDC and DCIS is present at the superomedial aspect of this NME. 2. No MRI evidence of malignancy involving the LEFT breast. 3. Intact BILATERAL retropectoral implants. 4. 2 pathologically enlarged RIGHT axillary lymph nodes. One of these nodes is biopsy-proven metastatic disease. No pathologic lymphadenopathy elsewhere   11/25/2019 PET scan   IMPRESSION: 1. Two mildly enlarged hypermetabolic right axillary lymph nodes compatible with right axillary nodal metastases. 2. No additional sites of hypermetabolic metastatic disease. 3. Asymmetric indistinct upper right breast hypermetabolism, without discrete mass correlate on the CT images, compatible with known primary right breast malignancy.     11/26/2019 Genetic Testing   Negative genetic testing: no pathogenic variants detected in [facility] STAT Breast Cancer Panel.  Variant of uncertain significance (VUS) detected in CHEK2 at c.1556G>T (p.Arg519Leu).  The report date is November 26, 2019.   The STAT Breast cancer panel offered by [facility] includes sequencing and rearrangement analysis for the following 9 genes:  ATM, BRCA1, BRCA2, CDH1, CHEK2, PALB2, PTEN, STK11 and TP53.    Results of [facility] Multi-Cancer Panel are pending.    12/01/2019 -  Anti-estrogen oral therapy   Neoadjuvant Tamoxifen 19m on 12/01/19. Reduced to 176mon 12/09/19.       ---Zoladex injection monthly starting 12/09/19.       ---switched Tamoxifen to anastrozole on 01/06/20   12/08/2019 Genetic Testing   Positive genetic testing: pathogenic variant detected in RAD51D c.694C>T (p.Arg232*) [facility].  Variants of uncertain significance detected RAD51D at c.715C>T (p.Arg239Trp) and CHEK2 at c.1556G>T (p.Arg519Leu).  The report date is December 08, 2019.    The Multi-Cancer Panel offered by [facility] includes sequencing and/or deletion duplication testing of the following 85 genes: AIP, ALK, APC, ATM, AXIN2,BAP1,  BARD1, BLM, BMPR1A, BRCA1, BRCA2, BRIP1, CASR, CDC73, CDH1, CDK4, CDKN1B, CDKN1C, CDKN2A (p14ARF), CDKN2A (p16INK4a), CEBPA, CHEK2, CTNNA1, DICER1, DIS3L2, EGFR (c.2369C>T, p.Thr790Met variant only), EPCAM (Deletion/duplication testing only), FH, FLCN, GATA2, GPC3, GREM1 (Promoter region deletion/duplication testing only), HOXB13 (c.251G>A, p.Gly84Glu), HRAS, KIT, MAX, MEN1, MET, MITF (c.952G>A, p.Glu318Lys variant only), MLH1, MSH2, MSH3, MSH6, MUTYH, NBN, NF1, NF2, NTHL1, PALB2, PDGFRA, PHOX2B, PMS2, POLD1, POLE, POT1, PRKAR1A, PTCH1, PTEN, RAD50, RAD51C, RAD51D, RB1, RECQL4, RET, RNF43, RUNX1, SDHAF2, SDHA (sequence changes only), SDHB, SDHC, SDHD, SMAD4, SMARCA4, SMARCB1, SMARCE1, STK11, SUFU, TERC, TERT, TMEM127, TP53, TSC1, TSC2, VHL, WRN and WT1.    12/09/2019 Miscellaneous   Mammaprint luminal type A, low risk  10% risk of recurrence in 10 years with 97.8% benefor of hormaonal therapy alone.    01/12/2020 - 03/25/2020 Chemotherapy   Verzenio started on 01/12/2020, dose reduced to 5021mm and 100m98m11/2022 due to poor tolerance. Held Verzenio on 03/25/20 to proceed with breast surgery.    03/01/2020 Mammogram   Targeted ultrasound is performed, showing interval decreasing conspicuity of the patient's known right breast cancer. A post biopsy clip with an associated irregular area shadowing is demonstrated at the 11 o'clock position 4 cm from the nipple. Exact measurements are difficult due  to the vague appearance of the findings. It measures approximately 1.2 x 0.9 x 0.6 cm (previously 4.7 x 2.2 x 1.5 cm).   IMPRESSION: Imaging findings consistent with good response to chemotherapy.   03/16/2020 Imaging   MRI Breast  IMPRESSION: 1. Interval resolution of RIGHT axillary adenopathy. 2. Slightly smaller area of non mass enhancement  in the UPPER-OUTER QUADRANT of the RIGHT breast.   04/15/2020 Surgery   RIGHT BREAST LUMPECTOMY WITH RADIOACTIVE SEED LOCALIZATION and RADIOACTIVE SEED GUIDED RIGHT AXILLARY SENTINEL LYMPH NODE DISSECTION and SENTINEL NODE BIOPSY by Dr Georgette Dover    04/15/2020 Pathology Results   FINAL MICROSCOPIC DIAGNOSIS:   A. 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  - Invasive carcinoma broadly involves each the anterior, posterior,  superior and inferior margins.  - DCIS involves the inferior margin and is < 1 mm from each the anterior  and superior margins.  - Atypical lobular hyperplasia.  - Biopsy sites.  - See oncology table.   B. TARGETED LYMPH NODE, RIGHT AXILLA, BIOPSY:  - Metastatic carcinoma in (1) of (1) lymph node.  - Biopsy site.   C. SENTINEL LYMPH NODE, RIGHT AXILLA #1, BIOPSY:  - Metastatic carcinoma in (1) of (1) lymph node.   D. SENTINEL LYMPH NODE, RIGHT AXILLA #2, BIOPSY:  - One lymph node, negative for carcinoma (0/1).    04/15/2020 Cancer Staging   Staging form: Breast, AJCC 8th Edition - Pathologic stage from 04/15/2020: No Stage Recommended (ypT2, pN2a, cM0, G2, ER+, PR+, HER2-) - Signed by Truitt Merle, MD on 05/27/2020 Stage prefix: Post-therapy Histologic grading system: 3 grade system Residual tumor (R): R1 - Microscopic   05/18/2020 Surgery   Right Mastectomy  A. BREAST, RIGHT, MASTECTOMY:  - Residual invasive carcinoma with mixed ductal and lobular features.       Residual invasive carcinoma broadly involves the anterior inferior  soft tissue margin.       Residual invasive carcinoma is focally < 1 mm from the deep margin.  - Residual ductal carcinoma in situ.       Residual DCIS is 3 mm from the closest margin, deep.  - Residual atypical lobular hyperplasia.  - Prior procedure site  changes.  - Biopsy clip (X2).   B. BREAST IMPLANT, RIGHT, EXPLANTATION:  - Implant (gross only).   C. LYMPH NODES, RIGHT AXILLA, DISSECTION:  - Metastatic carcinoma in (2) of (12) lymph nodes.  - Prior procedure site changes in surrounding soft tissue.    06/24/2020 -  Chemotherapy    Patient is on Treatment Plan: BREAST ADJUVANT DOSE DENSE AC Q14D / PACLITAXEL Q7D          INTERVAL HISTORY:  Ayani Ospina is here for a follow up of breast cancer. She was last seen by me on 09/30/20. She presents to the clinic alone. She reports her chest pain, which has been chronic for her, has worsened since starting radiation. She rates the pain at 10/10 and is able to tolerate with tylenol and gabapentin. She reports her neuropathy symptoms and energy level are improving. She reports swelling to her right hand for the last 3 weeks; she denies injury or any other joint swelling.   All other systems were reviewed with the patient and are negative.  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   Pelvic pain 09/2008    SURGICAL HISTORY: Past Surgical History:  Procedure Laterality Date   AUGMENTATION MAMMAPLASTY Bilateral 3202   silicone    BREAST IMPLANT REMOVAL Right 05/18/2020   Procedure: REMOVAL OF RIGHT BREAST IMPLANT;  Surgeon: Donnie Mesa, MD;  Location: [facility];  Service: General;  Laterality: Right;   BREAST LUMPECTOMY WITH RADIOACTIVE SEED  LOCALIZATION Right 04/15/2020   Procedure: RIGHT BREAST LUMPECTOMY WITH RADIOACTIVE SEED LOCALIZATION;  Surgeon: Donnie Mesa, MD;  Location: [facility];  Service: General;  Laterality: Right;   MODIFIED MASTECTOMY Right 05/18/2020   Procedure: RIGHT MODIFIED RADICAL MASTECTOMY;  Surgeon: Donnie Mesa, MD;  Location: [facility];  Service: General;  Laterality: Right;   PORTACATH PLACEMENT Left 06/10/2020   Procedure: INSERTION PORT-A-CATH;  Surgeon: Donnie Mesa, MD;  Location: [facility];  Service: General;  Laterality: Left;  45 MINUTES ROOM 2   RADIOACTIVE SEED [place] Right 04/15/2020   Procedure: RADIOACTIVE SEED GUIDED RIGHT AXILLARY SENTINEL LYMPH NODE DISSECTION;  Surgeon: Donnie Mesa, MD;  Location: [facility];  Service: General;  Laterality: Right;   SENTINEL NODE BIOPSY N/A 04/15/2020   Procedure: SENTINEL NODE BIOPSY;  Surgeon: Donnie Mesa, MD;  Location: [facility];  Service: General;  Laterality: N/A;   WISDOM TOOTH EXTRACTION      I have reviewed the social history and family history with the patient and they are unchanged from previous note.  ALLERGIES:  is allergic to ibuprofen.  MEDICATIONS:  Current Outpatient Medications  Medication Sig Dispense Refill   acetaminophen (TYLENOL) 500 MG tablet Take 500 mg by mouth every 6 (six) hours as needed for moderate pain.     clindamycin (CLINDAGEL) 1 % gel Apply topically 2 (two) times daily. 30 g 0   gabapentin (NEURONTIN) 100 MG capsule TAKE 2 CAPSULES(200 MG) BY MOUTH AT BEDTIME 60 capsule 1   hydrocortisone 1 % lotion Apply 1 application topically daily as needed (rash). 118 mL 0   lidocaine (LIDODERM) 5 % Place 1 patch onto the skin daily. Remove & Discard patch within 12 hours or as directed by MD (Patient not taking: Reported on 10/06/2020) 30 patch 0   lidocaine-prilocaine (EMLA) cream Apply to affected area once (Patient not taking:  Reported on 10/06/2020) 30 g 3   magnesium oxide (MAG-OX) 400 (240 Mg) MG tablet Take 1 tablet (400 mg total) by mouth daily. (Patient not taking: No sig reported) 20 tablet 0   ondansetron (ZOFRAN ODT) 8 MG disintegrating tablet Take 1 tablet (8 mg total) by mouth every 8 (eight) hours as needed for nausea or vomiting. 30 tablet 0   ondansetron (ZOFRAN) 8 MG tablet Take 1 tablet (8 mg total) by mouth every 8 (eight) hours as needed. Start on the third day  after chemotherapy. 30 tablet 2   pantoprazole (PROTONIX) 40 MG tablet Take 1 tablet (40 mg total) by mouth daily. 30 tablet 3   prochlorperazine (COMPAZINE) 10 MG tablet TAKE 1 TABLET(10 MG) BY MOUTH EVERY 6 HOURS AS NEEDED FOR NAUSEA OR VOMITING 30 tablet 3   traMADol (ULTRAM) 50 MG tablet Take 1 tablet (50 mg total) by mouth every 6 (six) hours as needed (mild pain). (Patient not taking: No sig reported) 30 tablet 0   venlafaxine (EFFEXOR) 37.5 MG tablet Take 1 tablet (37.5 mg total) by mouth daily. 30 tablet 30   No current facility-administered medications for this visit.   Facility-Administered Medications Ordered in Other Visits  Medication Dose Route Frequency Provider Last Rate Last Admin   hyaluronate sodium (RADIAPLEXRX) gel   Topical Once Hayden Pedro, PA-C       non-metallic deodorant Jethro Poling) 1 application  1 application Topical Once Hayden Pedro, PA-C        PHYSICAL EXAMINATION: ECOG PERFORMANCE STATUS: 2 - Symptomatic, <50% confined to bed  There were no vitals filed for this visit. Wt Readings from Last 3 Encounters:  10/06/20 136 lb 3.2 oz (61.8 kg)  09/30/20 135 lb 14.4 oz (61.6 kg)  09/24/20 133 lb 12.8 oz (60.7 kg)     GENERAL:alert, no distress and comfortable SKIN: skin color, texture, turgor are normal, no rashes or significant lesions EYES: normal, Conjunctiva are pink and non-injected, sclera clear  NECK: supple, thyroid normal size, non-tender, without nodularity LYMPH:  no palpable  lymphadenopathy in the cervical, axillary  LUNGS: clear to auscultation and percussion with normal breathing effort HEART: regular rate & rhythm and no murmurs and no lower extremity edema ABDOMEN:abdomen soft, non-tender and normal bowel sounds Musculoskeletal:no cyanosis of digits and no clubbing. (+) Tenderness to sternal area and back NEURO: alert & oriented x 3 with fluent speech, no focal motor/sensory deficits BREAST: tenderness. No palpable mass, nodules or adenopathy bilaterally. Breast exam benign.   LABORATORY DATA:  I have reviewed the data as listed CBC Latest Ref Rng & Units 09/30/2020 09/24/2020 09/16/2020  WBC 4.0 - 10.5 K/uL 5.9 5.2 5.6  Hemoglobin 12.0 - 15.0 g/dL 10.0(L) 10.6(L) 10.5(L)  Hematocrit 36.0 - 46.0 % 30.0(L) 31.1(L) 30.7(L)  Platelets 150 - 400 K/uL 335 341 320     CMP Latest Ref Rng & Units 09/30/2020 09/24/2020 09/16/2020  Glucose 70 - 99 mg/dL 79 135(H) 120(H)  BUN 6 - 20 mg/dL 11 13 10   Creatinine 0.44 - 1.00 mg/dL 0.64 0.73 0.66  Sodium 135 - 145 mmol/L 139 141 142  Potassium 3.5 - 5.1 mmol/L 4.1 3.7 3.9  Chloride 98 - 111 mmol/L 106 106 106  CO2 22 - 32 mmol/L 25 26 26   Calcium 8.9 - 10.3 mg/dL 9.4 9.6 9.7  Total Protein 6.5 - 8.1 g/dL 7.0 7.0 7.2  Total Bilirubin 0.3 - 1.2 mg/dL 0.4 0.4 0.4  Alkaline Phos 38 - 126 U/L 67 62 58  AST 15 - 41 U/L 60(H) 48(H) 37  ALT 0 - 44 U/L 130(H) 87(H) 63(H)      RADIOGRAPHIC STUDIES: I have personally reviewed the radiological images as listed and agreed with the findings in the report. No results found.    No orders of the defined types were placed in this encounter.  All questions were answered. The patient knows to call the clinic with any problems, questions or concerns. No barriers to learning was detected. The total time spent in the appointment  was 30 minutes.     Truitt Merle, MD 10/29/2020   I, Wilburn Mylar, am acting as scribe for Truitt Merle, MD.   I have reviewed the above documentation for  accuracy and completeness, and I agree with the above.

## 2020-10-30 ENCOUNTER — Encounter: Payer: Self-pay | Admitting: Hematology

## 2020-11-01 ENCOUNTER — Telehealth: Payer: Self-pay | Admitting: Hematology

## 2020-11-01 ENCOUNTER — Other Ambulatory Visit: Payer: Self-pay

## 2020-11-01 ENCOUNTER — Ambulatory Visit
Admission: RE | Admit: 2020-11-01 | Discharge: 2020-11-01 | Disposition: A | Discharge status: Home / Self Care | Payer: 59 | Source: Ambulatory Visit | Attending: Radiation Oncology | Admitting: Radiation Oncology

## 2020-11-01 DIAGNOSIS — Z510 Encounter for antineoplastic radiation therapy: Secondary | ICD-10-CM | POA: Diagnosis not present

## 2020-11-01 NOTE — Telephone Encounter (Signed)
Left message with follow-up appointment per 10/14 los. 

## 2020-11-02 ENCOUNTER — Ambulatory Visit
Admission: RE | Admit: 2020-11-02 | Discharge: 2020-11-02 | Disposition: A | Discharge status: Home / Self Care | Payer: 59 | Source: Ambulatory Visit | Attending: Radiation Oncology | Admitting: Radiation Oncology

## 2020-11-02 ENCOUNTER — Other Ambulatory Visit: Payer: Self-pay

## 2020-11-02 DIAGNOSIS — Z510 Encounter for antineoplastic radiation therapy: Secondary | ICD-10-CM | POA: Diagnosis not present

## 2020-11-03 ENCOUNTER — Encounter: Payer: Self-pay | Admitting: Physical Therapy

## 2020-11-03 ENCOUNTER — Ambulatory Visit: Payer: 59 | Admitting: Physical Therapy

## 2020-11-03 ENCOUNTER — Ambulatory Visit
Admission: RE | Admit: 2020-11-03 | Discharge: 2020-11-03 | Disposition: A | Discharge status: Home / Self Care | Payer: 59 | Source: Ambulatory Visit | Attending: Radiation Oncology | Admitting: Radiation Oncology

## 2020-11-03 DIAGNOSIS — M25511 Pain in right shoulder: Secondary | ICD-10-CM

## 2020-11-03 DIAGNOSIS — Z170 Estrogen receptor positive status [ER+]: Secondary | ICD-10-CM

## 2020-11-03 DIAGNOSIS — Z483 Aftercare following surgery for neoplasm: Secondary | ICD-10-CM

## 2020-11-03 DIAGNOSIS — M25611 Stiffness of right shoulder, not elsewhere classified: Secondary | ICD-10-CM

## 2020-11-03 DIAGNOSIS — C50411 Malignant neoplasm of upper-outer quadrant of right female breast: Secondary | ICD-10-CM

## 2020-11-03 DIAGNOSIS — I972 Postmastectomy lymphedema syndrome: Secondary | ICD-10-CM

## 2020-11-03 DIAGNOSIS — Z510 Encounter for antineoplastic radiation therapy: Secondary | ICD-10-CM | POA: Diagnosis not present

## 2020-11-03 DIAGNOSIS — R293 Abnormal posture: Secondary | ICD-10-CM

## 2020-11-03 NOTE — Patient Instructions (Signed)

## 2020-11-03 NOTE — Therapy (Signed)
[facility] [place], [place], [address] Phone: [phone]   Fax:  [phone]  Physical Therapy Treatment  Patient Details  Name: Caitlyn Miller MRN: [id number] Date of Birth: Apr 12, 1985 Referring Provider (PT): Dr. Rodman Key Tsuei/Feng   Encounter Date: 11/03/2020   PT End of Session - 11/03/20 1203     Visit Number 11    Number of Visits 18    Date for PT Re-Evaluation 12/15/20    PT Start Time 1103    PT Stop Time 1021    PT Time Calculation (min) 53 min    Activity Tolerance Patient tolerated treatment well    Behavior During Therapy [facility] for tasks assessed/performed             Past Medical History:  Diagnosis Date   Abnormal Pap smear    Acid reflux    Anemia    Anxiety    Breast cancer ([place])    Decreased appetite 10/08   Depression    Endometriosis 10/2004   Epigastric pain 10/2006   Family history of breast cancer 11/19/2019   FH: migraines    GBS carrier    H/O amenorrhea 06/2006   H/O dyspareunia 7/07   H/O fatigue    H/O nausea and vomiting 10/2006   H/O rubella    H/O varicella    Hyperemesis arising during pregnancy    First pregnancy   Irregular periods/menstrual cycles 01/1733   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   Pelvic pain 09/2008    Past Surgical History:  Procedure Laterality Date   AUGMENTATION MAMMAPLASTY Bilateral 0301   silicone    BREAST IMPLANT REMOVAL Right 05/18/2020   Procedure: REMOVAL OF RIGHT BREAST IMPLANT;  Surgeon: Donnie Mesa, MD;  Location: [place];  Service: General;  Laterality: Right;   BREAST LUMPECTOMY WITH RADIOACTIVE SEED LOCALIZATION Right 04/15/2020   Procedure: RIGHT BREAST LUMPECTOMY WITH RADIOACTIVE SEED LOCALIZATION;  Surgeon: Donnie Mesa, MD;  Location: [place];  Service: General;  Laterality: Right;   MODIFIED MASTECTOMY Right 05/18/2020   Procedure: RIGHT MODIFIED RADICAL MASTECTOMY;  Surgeon: Donnie Mesa, MD;  Location: [place];  Service: General;  Laterality: Right;   PORTACATH PLACEMENT Left 06/10/2020   Procedure: INSERTION PORT-A-CATH;  Surgeon: Donnie Mesa, MD;  Location: [place];  Service: General;  Laterality: Left;  45 MINUTES ROOM 2   RADIOACTIVE SEED GUIDED AXILLARY SENTINEL LYMPH NODE Right 04/15/2020   Procedure: RADIOACTIVE SEED GUIDED RIGHT AXILLARY SENTINEL LYMPH NODE DISSECTION;  Surgeon: Donnie Mesa, MD;  Location: [place];  Service: General;  Laterality: Right;   SENTINEL NODE BIOPSY N/A 04/15/2020   Procedure: SENTINEL NODE BIOPSY;  Surgeon: Donnie Mesa, MD;  Location: [place];  Service: General;  Laterality: N/A;   WISDOM TOOTH EXTRACTION      There were no vitals filed for this visit.   Subjective Assessment - 11/03/20 1104     Subjective Everytime I wear the glove my fingers feel cold and get blue. I fell on the stairs and hit my back.    Pertinent History Patient was diagnosed on 11/05/2019 with right grade II invasive ductal carcinoma breast cancer. It measures 4.7 cm and is located in the upper outer quadrant. It is ER/PR positive and HER2 negative with a Ki67 of 5%. She has a biopsied positive axillary lymph node. She had a Right lumpectomy with SLNB (2/3+) performed on 04/15/2020,  right  modified mastectomy ALND (2/12), currently doing chemo and will need radiation    Patient Stated Goals Get back to normal use of right UE    Currently in Pain? Yes    Pain Score 9     Pain Location Back    Pain Orientation Lower    Pain Descriptors / Indicators Shooting;Aching    Pain Type Acute pain    Pain Onset In the past 7 days    Pain Frequency Constant    Aggravating Factors  standing up, rotating    Pain Relieving Factors lidocaine patch    Effect of Pain on Daily Activities effects it a lot                               [facility] Adult PT Treatment/Exercise - 11/03/20 0001        Manual Therapy   Edema Management assessed sleeve and glove for proper fit    Manual Lymphatic Drainage (MLD) instructed pt in the following and had pt return demonstrate all steps including proper skin stretch technique: in supine: short neck, 5 diaphramatic breaths, left axillary nodes and establishment of interaxillary pathway, right inguinal nodes and establishment of axillo inguinal pathway, R UE working proximal to distal then retracing all steps while educating pt about anatomy and physiology of the lymphatic system and basic principles of self MLD                          PT Long Term Goals - 10/20/20 1706       PT LONG TERM GOAL #1   Title Patient will demonstrate she has regained near  full shoulder ROM and function post operatively compared to baselines.    Baseline End ROM improved but still limited by cording- 05/13/20; 07/15/20- pt limited at end range by tightness though ROM has improved; 09/01/20- pt is no longer limited at end range    Time 6    Period Weeks    Status Achieved      PT LONG TERM GOAL #2   Title Pt will have decreased pain by atleast 50%    Baseline Pt reports no pain at this time as motion improves-05/13/20    Time 3    Period Weeks    Status Achieved      PT LONG TERM GOAL #3   Title Pt will have quick dash no greater than 20% for return to function prior to next surgery    Baseline 70%; pt did not retake this today but reports no longer feeling limited with reaching and ADLs-05/13/20; 09/01/20- 13.64    Time 3    Period Weeks    Status Achieved      PT LONG TERM GOAL #4   Title pt will be able to dress, bathe and perform home activities with improved ease    Time 3    Period Weeks    Status Achieved      PT LONG TERM GOAL #5   Title Pt will be independent in a home exercise program for continued strengthening and stretching so she can avoid increased tightness with radiation    Time 6    Period Weeks    Status On-going       Additional Long Term Goals   Additional Long Term Goals Yes      PT LONG TERM GOAL #6   Title Pt will be  independent with self MLD for long term management of lymphedema.    Time 8    Period Weeks    Status New    Target Date 12/15/20      PT LONG TERM GOAL #7   Title Pt will obtain appropriate compression garments for management of lymphedema.    Time 8    Period Weeks    Status New    Target Date 12/15/20      PT LONG TERM GOAL #8   Title Pt will report no shoulder pain during the day to allow her to return to PLOF.    Time 8    Period Weeks    Status New    Target Date 12/15/20                   Plan - 11/03/20 1204     Clinical Impression Statement Assessed pt's compression sleeve and glove for fit. Her sleeve fits well but she reports her fingertips become cold when wearing the glove. The fingers in the ready made glove are long for her fingers so educated pt to just wear them to the very tip of her finger instead of pushing them backwards and this should relieve the issue. Educated pt on importance of wearing sleeve and glove 12 hours a day for 4 weeks to decrease risk of subclinical lymphedema. Pt is still demonstrating swelling in 2nd digit of R hand. Instructed pt in self MLD and issued handout. Had pt return demonstrate entire technique for self MLD of R UE.    PT Frequency 1x / week    PT Duration 8 weeks    PT Treatment/Interventions ADLs/Self Care Home Management;Therapeutic exercise;Patient/family education;Manual techniques;Manual lymph drainage;Scar mobilization;Passive range of motion;Orthotic Fit/Training;Compression bandaging    PT Next Visit Plan continue instructing in self MLD to RUE, re do SOZO week of Nov 2, MFR to cording in R axilla and anterior chest, see how R infraspinatus is,and instructing in an HEP for R shoulder ROM and strength, instruct in Strength ABC - pt to begin radiation on oct 10    PT Home Exercise Plan Post op shoulder ROM HEP  (supine for flex and stargazer), supine dowel flex and abd    Consulted and Agree with Plan of Care Patient             Patient will benefit from skilled therapeutic intervention in order to improve the following deficits and impairments:  Postural dysfunction, Decreased range of motion, Impaired UE functional use, Pain, Decreased knowledge of precautions, Decreased activity tolerance, Impaired sensation, Decreased strength, Increased fascial restricitons  Visit Diagnosis: Postmastectomy lymphedema  Stiffness of right shoulder, not elsewhere classified  Acute pain of right shoulder  Aftercare following surgery for neoplasm  Abnormal posture  Malignant neoplasm of upper-outer quadrant of right breast in female, estrogen receptor positive ([place])     Problem List Patient Active Problem List   Diagnosis Date Noted   Port-A-Cath in place 09/24/2020   Menopausal syndrome (hot flushes) 16/10/9602   Monoallelic mutation of [id number] gene 12/19/2019   Genetic testing 11/26/2019   Family history of breast cancer 11/19/2019   Malignant neoplasm of upper-outer quadrant of right breast in female, estrogen receptor positive ([place]) 11/17/2019   S/P breast augmentation 09/18/2019   Hx of migraines 06/20/2011   GERD (gastroesophageal reflux disease) 06/20/2011   Anemia 06/20/2011    Manus Gunning, PT 11/03/2020, 12:08 PM  [facility] @ [place]  [place], [place], [address] Phone: [phone]   Fax:  [phone]  Name: Nikaya Nasby MRN: [id number] Date of Birth: [birth year]   Manus Gunning, PT 11/03/20 12:08 PM

## 2020-11-04 ENCOUNTER — Other Ambulatory Visit: Payer: Self-pay

## 2020-11-04 ENCOUNTER — Ambulatory Visit
Admission: RE | Admit: 2020-11-04 | Discharge: 2020-11-04 | Disposition: A | Discharge status: Home / Self Care | Payer: 59 | Source: Ambulatory Visit | Attending: Radiation Oncology | Admitting: Radiation Oncology

## 2020-11-04 DIAGNOSIS — Z510 Encounter for antineoplastic radiation therapy: Secondary | ICD-10-CM | POA: Diagnosis not present

## 2020-11-05 ENCOUNTER — Ambulatory Visit
Admission: RE | Admit: 2020-11-05 | Discharge: 2020-11-05 | Disposition: A | Discharge status: Home / Self Care | Payer: 59 | Source: Ambulatory Visit | Attending: Radiation Oncology | Admitting: Radiation Oncology

## 2020-11-05 DIAGNOSIS — Z510 Encounter for antineoplastic radiation therapy: Secondary | ICD-10-CM | POA: Diagnosis not present

## 2020-11-08 ENCOUNTER — Ambulatory Visit
Admission: RE | Admit: 2020-11-08 | Discharge: 2020-11-08 | Disposition: A | Discharge status: Home / Self Care | Payer: 59 | Source: Ambulatory Visit | Attending: Radiation Oncology | Admitting: Radiation Oncology

## 2020-11-08 ENCOUNTER — Other Ambulatory Visit: Payer: Self-pay

## 2020-11-08 DIAGNOSIS — Z510 Encounter for antineoplastic radiation therapy: Secondary | ICD-10-CM | POA: Diagnosis not present

## 2020-11-09 ENCOUNTER — Ambulatory Visit
Admission: RE | Admit: 2020-11-09 | Discharge: 2020-11-09 | Disposition: A | Discharge status: Home / Self Care | Payer: 59 | Source: Ambulatory Visit | Attending: Radiation Oncology | Admitting: Radiation Oncology

## 2020-11-09 DIAGNOSIS — Z510 Encounter for antineoplastic radiation therapy: Secondary | ICD-10-CM | POA: Diagnosis not present

## 2020-11-10 ENCOUNTER — Ambulatory Visit
Admission: RE | Admit: 2020-11-10 | Discharge: 2020-11-10 | Disposition: A | Discharge status: Home / Self Care | Payer: 59 | Source: Ambulatory Visit | Attending: Radiation Oncology | Admitting: Radiation Oncology

## 2020-11-10 ENCOUNTER — Ambulatory Visit: Payer: 59 | Admitting: Physical Therapy

## 2020-11-10 ENCOUNTER — Other Ambulatory Visit: Payer: Self-pay

## 2020-11-10 DIAGNOSIS — Z510 Encounter for antineoplastic radiation therapy: Secondary | ICD-10-CM | POA: Diagnosis not present

## 2020-11-11 ENCOUNTER — Ambulatory Visit
Admission: RE | Admit: 2020-11-11 | Discharge: 2020-11-11 | Disposition: A | Discharge status: Home / Self Care | Payer: 59 | Source: Ambulatory Visit | Attending: Radiation Oncology | Admitting: Radiation Oncology

## 2020-11-11 DIAGNOSIS — Z510 Encounter for antineoplastic radiation therapy: Secondary | ICD-10-CM | POA: Diagnosis not present

## 2020-11-12 ENCOUNTER — Ambulatory Visit
Admission: RE | Admit: 2020-11-12 | Discharge: 2020-11-12 | Disposition: A | Discharge status: Home / Self Care | Payer: 59 | Source: Ambulatory Visit | Attending: Radiation Oncology | Admitting: Radiation Oncology

## 2020-11-12 ENCOUNTER — Other Ambulatory Visit: Payer: Self-pay

## 2020-11-12 DIAGNOSIS — Z510 Encounter for antineoplastic radiation therapy: Secondary | ICD-10-CM | POA: Diagnosis not present

## 2020-11-15 ENCOUNTER — Ambulatory Visit
Admission: RE | Admit: 2020-11-15 | Discharge: 2020-11-15 | Disposition: A | Discharge status: Home / Self Care | Payer: 59 | Source: Ambulatory Visit | Attending: Radiation Oncology | Admitting: Radiation Oncology

## 2020-11-15 ENCOUNTER — Other Ambulatory Visit: Payer: Self-pay

## 2020-11-15 DIAGNOSIS — Z510 Encounter for antineoplastic radiation therapy: Secondary | ICD-10-CM | POA: Diagnosis not present

## 2020-11-16 ENCOUNTER — Ambulatory Visit
Admission: RE | Admit: 2020-11-16 | Discharge: 2020-11-16 | Disposition: A | Discharge status: Home / Self Care | Payer: 59 | Source: Ambulatory Visit | Attending: Radiation Oncology | Admitting: Radiation Oncology

## 2020-11-16 DIAGNOSIS — C50411 Malignant neoplasm of upper-outer quadrant of right female breast: Secondary | ICD-10-CM | POA: Insufficient documentation

## 2020-11-16 DIAGNOSIS — Z170 Estrogen receptor positive status [ER+]: Secondary | ICD-10-CM | POA: Diagnosis present

## 2020-11-17 ENCOUNTER — Other Ambulatory Visit: Payer: Self-pay

## 2020-11-17 ENCOUNTER — Ambulatory Visit
Admission: RE | Admit: 2020-11-17 | Discharge: 2020-11-17 | Disposition: A | Discharge status: Home / Self Care | Payer: 59 | Source: Ambulatory Visit | Attending: Radiation Oncology | Admitting: Radiation Oncology

## 2020-11-17 ENCOUNTER — Encounter: Payer: Self-pay | Admitting: Physical Therapy

## 2020-11-17 ENCOUNTER — Encounter: Payer: 59 | Admitting: Physical Therapy

## 2020-11-17 ENCOUNTER — Ambulatory Visit: Payer: 59 | Attending: Surgery | Admitting: Physical Therapy

## 2020-11-17 DIAGNOSIS — M25511 Pain in right shoulder: Secondary | ICD-10-CM

## 2020-11-17 DIAGNOSIS — Z483 Aftercare following surgery for neoplasm: Secondary | ICD-10-CM

## 2020-11-17 DIAGNOSIS — C50411 Malignant neoplasm of upper-outer quadrant of right female breast: Secondary | ICD-10-CM | POA: Diagnosis not present

## 2020-11-17 DIAGNOSIS — M25611 Stiffness of right shoulder, not elsewhere classified: Secondary | ICD-10-CM

## 2020-11-17 DIAGNOSIS — Z170 Estrogen receptor positive status [ER+]: Secondary | ICD-10-CM | POA: Insufficient documentation

## 2020-11-17 DIAGNOSIS — I972 Postmastectomy lymphedema syndrome: Secondary | ICD-10-CM

## 2020-11-17 DIAGNOSIS — R293 Abnormal posture: Secondary | ICD-10-CM

## 2020-11-17 NOTE — Therapy (Signed)
[facility] @ [place], [place], [address] Phone: [phone]   Fax:  [phone]  Physical Therapy Treatment  Patient Details  Name: Caitlyn Miller MRN: [id number] Date of Birth: [birth year] Referring Provider (PT): Dr. Rodman Key Tsuei/Feng   Encounter Date: 11/17/2020   PT End of Session - 11/17/20 1451     Visit Number 12    Number of Visits 18    Date for PT Re-Evaluation 12/15/20    PT Start Time 1406    PT Stop Time 7902    PT Time Calculation (min) 42 min    Activity Tolerance Patient tolerated treatment well    Behavior During Therapy [facility] for tasks assessed/performed             Past Medical History:  Diagnosis Date   Abnormal Pap smear    Acid reflux    Anemia    Anxiety    Breast cancer ([place])    Decreased appetite 10/08   Depression    Endometriosis 10/2004   Epigastric pain 10/2006   Family history of breast cancer 11/19/2019   FH: migraines    GBS carrier    H/O amenorrhea 06/2006   H/O dyspareunia 7/07   H/O fatigue    H/O nausea and vomiting 10/2006   H/O rubella    H/O varicella    Hyperemesis arising during pregnancy    First pregnancy   Irregular periods/menstrual cycles 04/971   Monoallelic mutation of [id number] gene 12/19/2019   Pelvic pain 09/2008    Past Surgical History:  Procedure Laterality Date   AUGMENTATION MAMMAPLASTY Bilateral 4268   silicone    BREAST IMPLANT REMOVAL Right 05/18/2020   Procedure: REMOVAL OF RIGHT BREAST IMPLANT;  Surgeon: Donnie Mesa, MD;  Location: [place];  Service: General;  Laterality: Right;   BREAST LUMPECTOMY WITH RADIOACTIVE SEED LOCALIZATION Right 04/15/2020   Procedure: RIGHT BREAST LUMPECTOMY WITH RADIOACTIVE SEED LOCALIZATION;  Surgeon: Donnie Mesa, MD;  Location: [place];  Service: General;  Laterality: Right;   MODIFIED MASTECTOMY Right 05/18/2020   Procedure: RIGHT MODIFIED RADICAL MASTECTOMY;  Surgeon: Donnie Mesa,  MD;  Location: [place];  Service: General;  Laterality: Right;   PORTACATH PLACEMENT Left 06/10/2020   Procedure: INSERTION PORT-A-CATH;  Surgeon: Donnie Mesa, MD;  Location: [place];  Service: General;  Laterality: Left;  45 MINUTES ROOM 2   RADIOACTIVE SEED GUIDED AXILLARY SENTINEL LYMPH NODE Right 04/15/2020   Procedure: RADIOACTIVE SEED GUIDED RIGHT AXILLARY SENTINEL LYMPH NODE DISSECTION;  Surgeon: Donnie Mesa, MD;  Location: [place];  Service: General;  Laterality: Right;   SENTINEL NODE BIOPSY N/A 04/15/2020   Procedure: SENTINEL NODE BIOPSY;  Surgeon: Donnie Mesa, MD;  Location: [place];  Service: General;  Laterality: N/A;   WISDOM TOOTH EXTRACTION      There were no vitals filed for this visit.   Subjective Assessment - 11/17/20 1407     Subjective I am halfway through radiation. I have been exhausted. I think the swelling is getting better.    Pertinent History Patient was diagnosed on 11/05/2019 with right grade II invasive ductal carcinoma breast cancer. It measures 4.7 cm and is located in the upper outer quadrant. It is ER/PR positive and HER2 negative with a Ki67 of 5%. She has a biopsied positive axillary lymph node. She had a Right lumpectomy with SLNB (2/3+) performed on 04/15/2020,  right modified mastectomy ALND (2/12), currently doing  chemo and will need radiation    Patient Stated Goals Get back to normal use of right UE    Currently in Pain? No/denies    Pain Score 0-No pain                   LYMPHEDEMA/ONCOLOGY QUESTIONNAIRE - 11/17/20 0001       Right Upper Extremity Lymphedema   10 cm Proximal to Olecranon Process 26 cm    Olecranon Process 25.3 cm    10 cm Proximal to Ulnar Styloid Process 20.7 cm    Just Proximal to Ulnar Styloid Process 15.5 cm    Across Hand at [employer] 19.2 cm    At [place] [place] of 2nd Digit 6.5 cm             L-DEX FLOWSHEETS - 11/17/20 1400       L-DEX  LYMPHEDEMA SCREENING   Measurement Type Unilateral    L-DEX MEASUREMENT EXTREMITY Upper Extremity    POSITION  Standing    DOMINANT SIDE Right    At Risk Side Right    BASELINE SCORE (UNILATERAL) 4.2    L-DEX SCORE (UNILATERAL) 15.1    VALUE CHANGE (UNILAT) 10.9                       [facility] Adult PT Treatment/Exercise - 11/17/20 0001       Manual Therapy   Myofascial Release to cording in R axilla with 1 cord palpable today    Manual Lymphatic Drainage (MLD) in supine: short neck, 5 diaphramatic breaths, left axillary nodes and establishment of interaxillary pathway, right inguinal nodes and establishment of axillo inguinal pathway, R UE working proximal to distal then retracing all steps                          PT Long Term Goals - 10/20/20 1706       PT LONG TERM GOAL #1   Title Patient will demonstrate she has regained near  full shoulder ROM and function post operatively compared to baselines.    Baseline End ROM improved but still limited by cording- 05/13/20; 07/15/20- pt limited at end range by tightness though ROM has improved; 09/01/20- pt is no longer limited at end range    Time 6    Period Weeks    Status Achieved      PT LONG TERM GOAL #2   Title Pt will have decreased pain by atleast 50%    Baseline Pt reports no pain at this time as motion improves-05/13/20    Time 3    Period Weeks    Status Achieved      PT LONG TERM GOAL #3   Title Pt will have quick dash no greater than 20% for return to function prior to next surgery    Baseline 70%; pt did not retake this today but reports no longer feeling limited with reaching and ADLs-05/13/20; 09/01/20- 13.64    Time 3    Period Weeks    Status Achieved      PT LONG TERM GOAL #4   Title pt will be able to dress, bathe and perform home activities with improved ease    Time 3    Period Weeks    Status Achieved      PT LONG TERM GOAL #5   Title Pt will be independent in a home exercise  program for continued strengthening and stretching  so she can avoid increased tightness with radiation    Time 6    Period Weeks    Status On-going      Additional Long Term Goals   Additional Long Term Goals Yes      PT LONG TERM GOAL #6   Title Pt will be independent with self MLD for long term management of lymphedema.    Time 8    Period Weeks    Status New    Target Date 12/15/20      PT LONG TERM GOAL #7   Title Pt will obtain appropriate compression garments for management of lymphedema.    Time 8    Period Weeks    Status New    Target Date 12/15/20      PT LONG TERM GOAL #8   Title Pt will report no shoulder pain during the day to allow her to return to PLOF.    Time 8    Period Weeks    Status New    Target Date 12/15/20                   Plan - 11/17/20 1455     Clinical Impression Statement Reassessed pt's SOZO score today and it has increased further from baseline despite wearing a sleeve 12 hrs a day for the last 4 weeks. Her number now is 15.1 making it 10.9 greater than baseline. Pt is currently in the midst of radiation and reports she feels swollen all over. Her index finger is still markedly swollen. Reassessed circumference measurements and they have also increased. Continued with MLD to RUE today. Encouarged pt to continue wearing her sleeve and glove and we reassess again in a few weeks. Pt also has cording in her R axilla that causes discomfort so spent time on myofascial release to this area.    PT Frequency 1x / week    PT Duration 8 weeks    PT Treatment/Interventions ADLs/Self Care Home Management;Therapeutic exercise;Patient/family education;Manual techniques;Manual lymph drainage;Scar mobilization;Passive range of motion;Orthotic Fit/Training;Compression bandaging    PT Next Visit Plan continue instructing in self MLD to RUE, re do SOZO several wks after completing radiation, MFR to cording in R axilla and anterior chest, see how R  infraspinatus is,and instructing in an HEP for R shoulder ROM and strength, instruct in Strength ABC - pt to begin radiation on oct 10    PT Home Exercise Plan Post op shoulder ROM HEP (supine for flex and stargazer), supine dowel flex and abd    Consulted and Agree with Plan of Care Patient             Patient will benefit from skilled therapeutic intervention in order to improve the following deficits and impairments:  Postural dysfunction, Decreased range of motion, Impaired UE functional use, Pain, Decreased knowledge of precautions, Decreased activity tolerance, Impaired sensation, Decreased strength, Increased fascial restricitons  Visit Diagnosis: Postmastectomy lymphedema  Stiffness of right shoulder, not elsewhere classified  Acute pain of right shoulder  Aftercare following surgery for neoplasm  Abnormal posture  Malignant neoplasm of upper-outer quadrant of right breast in female, estrogen receptor positive ([place])     Problem List Patient Active Problem List   Diagnosis Date Noted   Port-A-Cath in place 09/24/2020   Menopausal syndrome (hot flushes) 65/03/5463   Monoallelic mutation of KCL27N gene 12/19/2019   Genetic testing 11/26/2019   Family history of breast cancer 11/19/2019   Malignant neoplasm of upper-outer quadrant of right  breast in female, estrogen receptor positive ([place]) 11/17/2019   S/P breast augmentation 09/18/2019   Hx of migraines 06/20/2011   GERD (gastroesophageal reflux disease) 06/20/2011   Anemia 06/20/2011    Allyson Sabal [place], PT 11/17/2020, 2:58 PM  [facility] @ [place], [place], [address] Phone: [phone]   Fax:  [phone]  Name: Caitlyn Miller MRN: [id number] Date of Birth: [birth year]   Manus Gunning, PT 11/17/20 2:59 PM

## 2020-11-18 ENCOUNTER — Ambulatory Visit
Admission: RE | Admit: 2020-11-18 | Discharge: 2020-11-18 | Disposition: A | Discharge status: Home / Self Care | Payer: 59 | Source: Ambulatory Visit | Attending: Radiation Oncology | Admitting: Radiation Oncology

## 2020-11-18 DIAGNOSIS — C50411 Malignant neoplasm of upper-outer quadrant of right female breast: Secondary | ICD-10-CM | POA: Diagnosis not present

## 2020-11-19 ENCOUNTER — Other Ambulatory Visit: Payer: Self-pay

## 2020-11-19 ENCOUNTER — Ambulatory Visit: Payer: 59 | Admitting: Radiation Oncology

## 2020-11-19 ENCOUNTER — Ambulatory Visit
Admission: RE | Admit: 2020-11-19 | Discharge: 2020-11-19 | Disposition: A | Discharge status: Home / Self Care | Payer: 59 | Source: Ambulatory Visit | Attending: Radiation Oncology | Admitting: Radiation Oncology

## 2020-11-19 DIAGNOSIS — Z170 Estrogen receptor positive status [ER+]: Secondary | ICD-10-CM

## 2020-11-19 DIAGNOSIS — C50411 Malignant neoplasm of upper-outer quadrant of right female breast: Secondary | ICD-10-CM

## 2020-11-19 MED ORDER — RADIAPLEXRX EX GEL: Freq: Once | CUTANEOUS | Status: AC

## 2020-11-22 ENCOUNTER — Other Ambulatory Visit: Payer: Self-pay

## 2020-11-22 ENCOUNTER — Ambulatory Visit
Admission: RE | Admit: 2020-11-22 | Discharge: 2020-11-22 | Disposition: A | Discharge status: Home / Self Care | Payer: 59 | Source: Ambulatory Visit | Attending: Radiation Oncology | Admitting: Radiation Oncology

## 2020-11-22 DIAGNOSIS — C50411 Malignant neoplasm of upper-outer quadrant of right female breast: Secondary | ICD-10-CM | POA: Diagnosis not present

## 2020-11-23 ENCOUNTER — Ambulatory Visit
Admission: RE | Admit: 2020-11-23 | Discharge: 2020-11-23 | Disposition: A | Discharge status: Home / Self Care | Payer: 59 | Source: Ambulatory Visit | Attending: Radiation Oncology | Admitting: Radiation Oncology

## 2020-11-23 DIAGNOSIS — C50411 Malignant neoplasm of upper-outer quadrant of right female breast: Secondary | ICD-10-CM | POA: Diagnosis not present

## 2020-11-24 ENCOUNTER — Other Ambulatory Visit: Payer: Self-pay

## 2020-11-24 ENCOUNTER — Ambulatory Visit
Admission: RE | Admit: 2020-11-24 | Discharge: 2020-11-24 | Disposition: A | Discharge status: Home / Self Care | Payer: 59 | Source: Ambulatory Visit | Attending: Radiation Oncology | Admitting: Radiation Oncology

## 2020-11-24 DIAGNOSIS — C50411 Malignant neoplasm of upper-outer quadrant of right female breast: Secondary | ICD-10-CM | POA: Diagnosis not present

## 2020-11-25 ENCOUNTER — Ambulatory Visit: Payer: 59 | Admitting: Radiation Oncology

## 2020-11-25 ENCOUNTER — Ambulatory Visit
Admission: RE | Admit: 2020-11-25 | Discharge: 2020-11-25 | Disposition: A | Discharge status: Home / Self Care | Payer: 59 | Source: Ambulatory Visit | Attending: Radiation Oncology | Admitting: Radiation Oncology

## 2020-11-25 DIAGNOSIS — C50411 Malignant neoplasm of upper-outer quadrant of right female breast: Secondary | ICD-10-CM | POA: Diagnosis not present

## 2020-11-26 ENCOUNTER — Other Ambulatory Visit: Payer: Self-pay

## 2020-11-26 ENCOUNTER — Ambulatory Visit: Payer: 59 | Admitting: Radiation Oncology

## 2020-11-26 ENCOUNTER — Ambulatory Visit
Admission: RE | Admit: 2020-11-26 | Discharge: 2020-11-26 | Disposition: A | Discharge status: Home / Self Care | Payer: 59 | Source: Ambulatory Visit | Attending: Radiation Oncology | Admitting: Radiation Oncology

## 2020-11-26 DIAGNOSIS — C50411 Malignant neoplasm of upper-outer quadrant of right female breast: Secondary | ICD-10-CM | POA: Diagnosis not present

## 2020-11-28 ENCOUNTER — Other Ambulatory Visit: Payer: Self-pay | Admitting: Hematology

## 2020-11-29 ENCOUNTER — Ambulatory Visit: Payer: 59

## 2020-11-29 DIAGNOSIS — C50411 Malignant neoplasm of upper-outer quadrant of right female breast: Secondary | ICD-10-CM | POA: Diagnosis not present

## 2020-11-30 ENCOUNTER — Ambulatory Visit
Admission: RE | Admit: 2020-11-30 | Discharge: 2020-11-30 | Disposition: A | Discharge status: Home / Self Care | Payer: 59 | Source: Ambulatory Visit | Attending: Radiation Oncology | Admitting: Radiation Oncology

## 2020-11-30 ENCOUNTER — Other Ambulatory Visit: Payer: Self-pay

## 2020-11-30 DIAGNOSIS — C50411 Malignant neoplasm of upper-outer quadrant of right female breast: Secondary | ICD-10-CM | POA: Diagnosis not present

## 2020-12-01 ENCOUNTER — Ambulatory Visit
Admission: RE | Admit: 2020-12-01 | Discharge: 2020-12-01 | Disposition: A | Discharge status: Home / Self Care | Payer: 59 | Source: Ambulatory Visit | Attending: Radiation Oncology | Admitting: Radiation Oncology

## 2020-12-01 DIAGNOSIS — C50411 Malignant neoplasm of upper-outer quadrant of right female breast: Secondary | ICD-10-CM | POA: Diagnosis not present

## 2020-12-02 ENCOUNTER — Encounter: Payer: Self-pay | Admitting: Hematology

## 2020-12-02 ENCOUNTER — Ambulatory Visit
Admission: RE | Admit: 2020-12-02 | Discharge: 2020-12-02 | Disposition: A | Discharge status: Home / Self Care | Payer: 59 | Source: Ambulatory Visit | Attending: Radiation Oncology | Admitting: Radiation Oncology

## 2020-12-02 ENCOUNTER — Ambulatory Visit: Payer: 59 | Admitting: Radiation Oncology

## 2020-12-02 DIAGNOSIS — C50411 Malignant neoplasm of upper-outer quadrant of right female breast: Secondary | ICD-10-CM | POA: Diagnosis not present

## 2020-12-03 ENCOUNTER — Other Ambulatory Visit: Payer: Self-pay

## 2020-12-03 ENCOUNTER — Ambulatory Visit: Payer: 59

## 2020-12-03 ENCOUNTER — Ambulatory Visit
Admission: RE | Admit: 2020-12-03 | Discharge: 2020-12-03 | Disposition: A | Discharge status: Home / Self Care | Payer: 59 | Source: Ambulatory Visit | Attending: Radiation Oncology | Admitting: Radiation Oncology

## 2020-12-03 DIAGNOSIS — C50411 Malignant neoplasm of upper-outer quadrant of right female breast: Secondary | ICD-10-CM | POA: Diagnosis not present

## 2020-12-05 ENCOUNTER — Ambulatory Visit
Admission: RE | Admit: 2020-12-05 | Discharge: 2020-12-05 | Disposition: A | Discharge status: Home / Self Care | Payer: 59 | Source: Ambulatory Visit | Attending: Radiation Oncology | Admitting: Radiation Oncology

## 2020-12-05 ENCOUNTER — Encounter: Payer: Self-pay | Admitting: Hematology

## 2020-12-05 DIAGNOSIS — C50411 Malignant neoplasm of upper-outer quadrant of right female breast: Secondary | ICD-10-CM | POA: Diagnosis not present

## 2020-12-06 ENCOUNTER — Ambulatory Visit
Admission: RE | Admit: 2020-12-06 | Discharge: 2020-12-06 | Disposition: A | Discharge status: Home / Self Care | Payer: 59 | Source: Ambulatory Visit | Attending: Radiation Oncology | Admitting: Radiation Oncology

## 2020-12-06 ENCOUNTER — Ambulatory Visit: Payer: 59

## 2020-12-06 ENCOUNTER — Other Ambulatory Visit: Payer: Self-pay

## 2020-12-06 DIAGNOSIS — C50411 Malignant neoplasm of upper-outer quadrant of right female breast: Secondary | ICD-10-CM | POA: Diagnosis not present

## 2020-12-06 NOTE — Progress Notes (Signed)
She developed fatigue and anticipated skin changes in the treatment field.  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

## 2020-12-07 ENCOUNTER — Ambulatory Visit: Payer: 59

## 2020-12-07 ENCOUNTER — Encounter: Payer: Self-pay | Admitting: *Deleted

## 2020-12-07 DIAGNOSIS — C50411 Malignant neoplasm of upper-outer quadrant of right female breast: Secondary | ICD-10-CM

## 2020-12-07 DIAGNOSIS — Z170 Estrogen receptor positive status [ER+]: Secondary | ICD-10-CM

## 2020-12-08 ENCOUNTER — Ambulatory Visit: Payer: 59

## 2020-12-08 ENCOUNTER — Encounter: Payer: Self-pay | Admitting: Radiation Oncology

## 2020-12-13 ENCOUNTER — Other Ambulatory Visit: Payer: Self-pay

## 2020-12-13 ENCOUNTER — Ambulatory Visit
Admission: RE | Admit: 2020-12-13 | Discharge: 2020-12-13 | Disposition: A | Discharge status: Home / Self Care | Payer: 59 | Source: Ambulatory Visit | Attending: Radiation Oncology | Admitting: Radiation Oncology

## 2020-12-13 DIAGNOSIS — C50411 Malignant neoplasm of upper-outer quadrant of right female breast: Secondary | ICD-10-CM | POA: Diagnosis not present

## 2020-12-14 ENCOUNTER — Encounter: Payer: Self-pay | Admitting: Radiation Oncology

## 2020-12-14 ENCOUNTER — Ambulatory Visit
Admission: RE | Admit: 2020-12-14 | Discharge: 2020-12-14 | Disposition: A | Discharge status: Home / Self Care | Payer: 59 | Source: Ambulatory Visit | Attending: Radiation Oncology | Admitting: Radiation Oncology

## 2020-12-14 DIAGNOSIS — C50411 Malignant neoplasm of upper-outer quadrant of right female breast: Secondary | ICD-10-CM | POA: Diagnosis not present

## 2020-12-16 ENCOUNTER — Inpatient Hospital Stay: Payer: 59 | Attending: Hematology | Admitting: Hematology

## 2020-12-16 ENCOUNTER — Inpatient Hospital Stay: Payer: 59

## 2020-12-16 ENCOUNTER — Other Ambulatory Visit: Payer: Self-pay

## 2020-12-16 ENCOUNTER — Encounter: Payer: Self-pay | Admitting: Hematology

## 2020-12-16 VITALS — BP 110/70 | HR 71 | Temp 98.2°F | Resp 16 | Wt 140.0 lb

## 2020-12-16 DIAGNOSIS — F32A Depression, unspecified: Secondary | ICD-10-CM | POA: Diagnosis not present

## 2020-12-16 DIAGNOSIS — C50411 Malignant neoplasm of upper-outer quadrant of right female breast: Secondary | ICD-10-CM | POA: Insufficient documentation

## 2020-12-16 DIAGNOSIS — F419 Anxiety disorder, unspecified: Secondary | ICD-10-CM | POA: Diagnosis not present

## 2020-12-16 DIAGNOSIS — G620 Drug-induced polyneuropathy: Secondary | ICD-10-CM | POA: Diagnosis not present

## 2020-12-16 DIAGNOSIS — Z1231 Encounter for screening mammogram for malignant neoplasm of breast: Secondary | ICD-10-CM

## 2020-12-16 DIAGNOSIS — Z170 Estrogen receptor positive status [ER+]: Secondary | ICD-10-CM | POA: Insufficient documentation

## 2020-12-16 DIAGNOSIS — Z79811 Long term (current) use of aromatase inhibitors: Secondary | ICD-10-CM | POA: Diagnosis not present

## 2020-12-16 DIAGNOSIS — C773 Secondary and unspecified malignant neoplasm of axilla and upper limb lymph nodes: Secondary | ICD-10-CM | POA: Insufficient documentation

## 2020-12-16 DIAGNOSIS — Z452 Encounter for adjustment and management of vascular access device: Secondary | ICD-10-CM | POA: Diagnosis not present

## 2020-12-16 DIAGNOSIS — Z95828 Presence of other vascular implants and grafts: Secondary | ICD-10-CM

## 2020-12-16 LAB — CBC WITH DIFFERENTIAL (CANCER CENTER ONLY)
Abs Immature Granulocytes: 0.01 10*3/uL (ref 0.00–0.07)
Basophils Absolute: 0 10*3/uL (ref 0.0–0.1)
Basophils Relative: 1 %
Eosinophils Absolute: 0.1 10*3/uL (ref 0.0–0.5)
Eosinophils Relative: 2 %
HCT: 34.1 % — ABNORMAL LOW (ref 36.0–46.0)
Hemoglobin: 11.4 g/dL — ABNORMAL LOW (ref 12.0–15.0)
Immature Granulocytes: 0 %
Lymphocytes Relative: 32 %
Lymphs Abs: 1.2 10*3/uL (ref 0.7–4.0)
MCH: 31.1 pg (ref 26.0–34.0)
MCHC: 33.4 g/dL (ref 30.0–36.0)
MCV: 92.9 fL (ref 80.0–100.0)
Monocytes Absolute: 0.5 10*3/uL (ref 0.1–1.0)
Monocytes Relative: 15 %
Neutro Abs: 1.9 10*3/uL (ref 1.7–7.7)
Neutrophils Relative %: 50 %
Platelet Count: 205 10*3/uL (ref 150–400)
RBC: 3.67 MIL/uL — ABNORMAL LOW (ref 3.87–5.11)
RDW: 12.8 % (ref 11.5–15.5)
WBC Count: 3.7 10*3/uL — ABNORMAL LOW (ref 4.0–10.5)
nRBC: 0 % (ref 0.0–0.2)

## 2020-12-16 LAB — CMP (CANCER CENTER ONLY)
ALT: 44 U/L (ref 0–44)
AST: 34 U/L (ref 15–41)
Albumin: 4 g/dL (ref 3.5–5.0)
Alkaline Phosphatase: 68 U/L (ref 38–126)
Anion gap: 8 (ref 5–15)
BUN: 9 mg/dL (ref 6–20)
CO2: 25 mmol/L (ref 22–32)
Calcium: 9.2 mg/dL (ref 8.9–10.3)
Chloride: 107 mmol/L (ref 98–111)
Creatinine: 0.7 mg/dL (ref 0.44–1.00)
GFR, Estimated: >60 mL/min (ref >60)
Glucose, Bld: 81 mg/dL (ref 70–99)
Potassium: 4.2 mmol/L (ref 3.5–5.1)
Sodium: 140 mmol/L (ref 135–145)
Total Bilirubin: 0.4 mg/dL (ref 0.3–1.2)
Total Protein: 7 g/dL (ref 6.5–8.1)

## 2020-12-16 MED ORDER — TAMOXIFEN CITRATE 20 MG PO TABS: 20.0000 mg | ORAL_TABLET | Freq: Every day | ORAL | 1 refills | Status: DC

## 2020-12-16 MED ORDER — HEPARIN SOD (PORK) LOCK FLUSH 100 UNIT/ML IV SOLN
500.0000 [IU] | Freq: Once | INTRAVENOUS | Status: AC
  Administered 2020-12-16: 500 [IU]

## 2020-12-16 MED ORDER — PROCHLORPERAZINE MALEATE 10 MG PO TABS: 10.0000 mg | ORAL_TABLET | Freq: Three times a day (TID) | ORAL | 1 refills | Status: DC | PRN

## 2020-12-16 MED ORDER — SODIUM CHLORIDE 0.9% FLUSH
10.0000 mL | Freq: Once | INTRAVENOUS | Status: AC
  Administered 2020-12-16: 10 mL

## 2020-12-16 MED ORDER — GABAPENTIN 100 MG PO CAPS: 100.0000 mg | ORAL_CAPSULE | Freq: Every day | ORAL | 0 refills | Status: DC

## 2020-12-16 NOTE — Progress Notes (Signed)
Caitlyn Miller   [phone] [phone]   Clinic Follow up Note   Patient Care Team: Pcp, No as PCP - General Mansouraty, Telford Nab., MD as Consulting Physician (Gastroenterology) Mauro Kaufmann, RN as Oncology Nurse Navigator Rockwell Germany, RN as Oncology Nurse Navigator Donnie Mesa, MD as Consulting Physician (General Surgery) Truitt Merle, MD as Consulting Physician (Hematology) Kyung Rudd, MD as Consulting Physician (Radiation Oncology) Contogiannis, Audrea Muscat, MD as Consulting Physician (Plastic Surgery)  Date of Service:  12/16/2020  CHIEF COMPLAINT: f/u of right breast cancer  CURRENT THERAPY:  To restart tamoxifen 12/2020, start at 10 mg for 2-3 weeks  ASSESSMENT & PLAN:  Caitlyn Miller is a 35 y.o. female with   1. Malignant neoplasm of upper-outer quadrant of right breast, Stage IIA, p(T2N1aM0), ER+/PR+/HER2-, Grade II, Mammaprint luminal type A, low risk -She has a 4.7cm right breast mass at 11:00 position and two abnormal right axillary LN. 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 -Her 11/2019 breast MRI and PET scan did not indicate left breast or distant malignancy or metastasis.  -Mammaprint showed low risk disease and neoadjuvant chemotherapy is not recommended.  -She started neoadjuvant tamoxifen on 12/01/19. She tolerated poorly with nausea, and diffuse body aches. Dose reduced to 3m from 12/09/19 then switched to anastrozole on 01/06/20. I started her on monthly Zoladex injection on 12/09/19. -I started her on Verzenio on 01/12/2020, dose reduced to 566mam and 10079m/11/2020 due to poor tolerance. Held Verzenio on 03/25/20 to proceed with breast surgery. -Given her 03/01/20 [place] mammogram indicated good response to treatment, she proceeded with right lumpectomy and sentinel LN dissection on 04/15/20 with Dr TsuGeorgette Doverurgical pathology  showed larger than expected tumor at 3.1cm of invasive carcinoma with mixed ductal and lobular features and DCIS components. She also had positive margins and 2/3 positive LNs. -She underwent mastectomy and axillary node dissection on 05/18/20 showing: residual invasive carcinoma and DCIS. She again had a positive margin and another 2/12 positive nodes. She had stage III disease.  -chest CT 06/17/20 and CT AP 07/02/20 were negative. -She had first cycle AC on 06/24/20 and tolerated very poorly with a variety of symptoms causing 2 ED visits. -We moved to weekly Taxol on 07/08/20 and completed 10/29/20. -she received adjuvant RT 10/10-11/29/22, developed severe chest pain which she had similar episodes before with negative work up. She has recovered well -She is now ready to restart tamoxifen; she did not tolerate AI and verzenio before and declined ovarian suppression, AI and verzenio -due to her high risk of recurrence, I recommend a surveillance PET scan this month.  She would like to have her port removed after that scan.   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  -her neuropathy is improving off treatment, now only needs one gabapentin at night   3. Generalized Weakness, body pain, Fatigue, hot flushes  -Pt notes for the past 3+ years she has had mid chest and mid back pain. Previously intermittent, now constant. She also notes b/l arm and leg weakness and pain -This has lead to fatigue, low appetite, decreased daily function/activity. -[place]rologist and cardiologist. They said this is related to stress, anxiety or depression. Pt feels she has an illness causing this instead. -Her 11/25/19 PET was negative  for any distant malignancy or metastasis.  -will increase her Effexor for her worsening hot flashes -her chest pain has worsened since starting radiation. I think her pain  is neuropathic,  will review her case with Dr. Mickeal Skinner and see what he recommends.   4. Anxiety, depression -She was previously on Lexapro.  I switched her to Effexor, she is tolerating better    5. 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   -She has discussed with our genetic consoler    6. Social Support -She is from [place] (her first language is [place]) and lives both there and in [place] with her husband. She has 2 teenage children. The rest of her family is in [place].  -She notes her mother is living with her now. Her mother is able to help her, especially with her kids.     PLAN:  -I refilled her gabapentin and compazine -start tamoxifen in a few weeks  -start at half tablet (10 mg) for 2-3 weeks, then try increasing to full dose -PET scan to be done this month -I will reach out to Dr. Georgette Dover for port removal -survivorship visit in 2 months -lab and f/u in 5 months   No problem-specific Assessment & Plan notes found for this encounter.   SUMMARY OF ONCOLOGIC HISTORY: Oncology History Overview Note  Cancer Staging Malignant neoplasm of upper-outer quadrant of right breast in female, estrogen receptor positive ([place]) Staging form: Breast, AJCC 8th Edition - Clinical stage from 11/12/2019: Stage IIA (cT2, cN1, cM0, G2, ER+, PR+, HER2-) - Signed by Truitt Merle, MD on 11/19/2019 Stage prefix: Initial diagnosis Histologic grading system: 3 grade system - Pathologic stage from 04/15/2020: No Stage Recommended (ypT2, pN2a, cM0, G2, ER+, PR+, HER2-) - Signed by Truitt Merle, MD on 05/27/2020 Stage prefix: Post-therapy Histologic grading system: 3 grade system Residual tumor (R): R1 - Microscopic    Malignant neoplasm of upper-outer quadrant of right breast in female, estrogen receptor positive ([place])  11/05/2019 Mammogram   IMPRESSION: Large irregular palpable mass 4.7 x  1.4 x 1.4 cm within the upper-outer right breast 11 o'clock position, 4 cm from nipple.  There is a large area of associated coarse heterogeneous calcifications within the mass. Overall findings are concerning for breast carcinoma.   Two cortically thickened right axillary lymph nodes which are indeterminate in etiology.   11/12/2019 Cancer Staging   Staging form: Breast, AJCC 8th Edition - Clinical stage from 11/12/2019: Stage IIA (cT2, cN1, cM0, G2, ER+, PR+, HER2-) - Signed by Truitt Merle, MD on 11/19/2019    11/12/2019 Initial Biopsy   Diagnosis 1. Breast, right, needle core biopsy, right - INVASIVE MAMMARY CARCINOMA, SEE COMMENT. - MAMMARY CARCINOMA IN SITU. 2. Lymph node, needle/core biopsy, right - METASTATIC MAMMARY CARCINOMA. Microscopic Comment 1. The carcinoma appears grade 2 and measures 16 mm in greatest linear extent. E-cadherin will be ordered. Prognostic makers will be ordered. Dr. Saralyn Pilar has reviewed the case. The case was called to [facility] [place] on 01/13/2020.    1. E-cadherin is strongly positive consistent with a ductal phenotype.   11/12/2019 Receptors her2   1. PROGNOSTIC INDICATORS Results: IMMUNOHISTOCHEMICAL AND MORPHOMETRIC ANALYSIS PERFORMED MANUALLY The tumor cells are NEGATIVE for Her2 (0). Estrogen Receptor: 100%, POSITIVE, STRONG STAINING INTENSITY Progesterone Receptor: 100%, POSITIVE, STRONG STAINING INTENSITY Proliferation Marker Ki67: 5%   11/17/2019 Initial Diagnosis   Malignant neoplasm of upper-outer quadrant of right breast  in female, estrogen receptor positive ([place])   11/25/2019 Breast MRI   IMPRESSION: 1. Large area of non-mass enhancement involving the UPPER OUTER QUADRANT of the RIGHT breast measuring approximately 4.2 x 4.1 x 2.4 cm. The biopsy-proven IDC and DCIS is present at the superomedial aspect of this NME. 2. No MRI evidence of malignancy involving the LEFT breast. 3. Intact BILATERAL retropectoral  implants. 4. 2 pathologically enlarged RIGHT axillary lymph nodes. One of these nodes is biopsy-proven metastatic disease. No pathologic lymphadenopathy elsewhere   11/25/2019 PET scan   IMPRESSION: 1. Two mildly enlarged hypermetabolic right axillary lymph nodes compatible with right axillary nodal metastases. 2. No additional sites of hypermetabolic metastatic disease. 3. Asymmetric indistinct upper right breast hypermetabolism, without discrete mass correlate on the CT images, compatible with known primary right breast malignancy.     11/26/2019 Genetic Testing   Negative genetic testing: no pathogenic variants detected in [facility] STAT Breast Cancer Panel.  Variant of uncertain significance (VUS) detected in CHEK2 at c.1556G>T (p.Arg519Leu).  The report date is November 26, 2019.   The STAT Breast cancer panel offered by [facility] includes sequencing and rearrangement analysis for the following 9 genes:  ATM, BRCA1, BRCA2, CDH1, CHEK2, PALB2, PTEN, STK11 and TP53.    Results of [facility] Multi-Cancer Panel are pending.    12/01/2019 -  Anti-estrogen oral therapy   Neoadjuvant Tamoxifen 7m on 12/01/19. Reduced to 173mon 12/09/19.       ---Zoladex injection monthly starting 12/09/19.       ---switched Tamoxifen to anastrozole on 01/06/20   12/08/2019 Genetic Testing   Positive genetic testing: pathogenic variant detected in RAD51D c.694C>T (p.Arg232*) [facility].  Variants of uncertain significance detected RAD51D at c.715C>T (p.Arg239Trp) and CHEK2 at c.1556G>T (p.Arg519Leu).  The report date is December 08, 2019.   The Multi-Cancer Panel offered by [facility] includes sequencing and/or deletion duplication testing of the following 85 genes: AIP, ALK, APC, ATM, AXIN2,BAP1,  BARD1, BLM, BMPR1A, BRCA1, BRCA2, BRIP1, CASR, CDC73, CDH1, CDK4, CDKN1B, CDKN1C, CDKN2A (p14ARF), CDKN2A (p16INK4a), CEBPA, CHEK2, CTNNA1, DICER1, DIS3L2, EGFR (c.2369C>T, p.Thr790Met variant  only), EPCAM (Deletion/duplication testing only), FH, FLCN, GATA2, GPC3, GREM1 (Promoter region deletion/duplication testing only), HOXB13 (c.251G>A, p.Gly84Glu), HRAS, KIT, MAX, MEN1, MET, MITF (c.952G>A, p.Glu318Lys variant only), MLH1, MSH2, MSH3, MSH6, MUTYH, NBN, NF1, NF2, NTHL1, PALB2, PDGFRA, PHOX2B, PMS2, POLD1, POLE, POT1, PRKAR1A, PTCH1, PTEN, RAD50, RAD51C, RAD51D, RB1, RECQL4, RET, RNF43, RUNX1, SDHAF2, SDHA (sequence changes only), SDHB, SDHC, SDHD, SMAD4, SMARCA4, SMARCB1, SMARCE1, STK11, SUFU, TERC, TERT, TMEM127, TP53, TSC1, TSC2, VHL, WRN and WT1.    12/09/2019 Miscellaneous   Mammaprint luminal type A, low risk  10% risk of recurrence in 10 years with 97.8% benefor of hormaonal therapy alone.    01/12/2020 - 03/25/2020 Chemotherapy   Verzenio started on 01/12/2020, dose reduced to 5053mm and 100m94m11/2022 due to poor tolerance. Held Verzenio on 03/25/20 to proceed with breast surgery.    03/01/2020 Mammogram   Targeted ultrasound is performed, showing interval decreasing conspicuity of the patient's known right breast cancer. A post biopsy clip with an associated irregular area shadowing is demonstrated at the 11 o'clock position 4 cm from the nipple. Exact measurements are difficult due to the vague appearance of the findings. It measures approximately 1.2 x 0.9 x 0.6 cm (previously 4.7 x 2.2 x 1.5 cm).   IMPRESSION: Imaging findings consistent with good response to chemotherapy.   03/16/2020 Imaging   MRI Breast  IMPRESSION: 1. Interval resolution of  RIGHT axillary adenopathy. 2. Slightly smaller area of non mass enhancement in the UPPER-OUTER QUADRANT of the RIGHT breast.   04/15/2020 Surgery   RIGHT BREAST LUMPECTOMY WITH RADIOACTIVE SEED LOCALIZATION and RADIOACTIVE SEED GUIDED RIGHT AXILLARY SENTINEL LYMPH NODE DISSECTION and SENTINEL NODE BIOPSY by Dr Georgette Dover    04/15/2020 Pathology Results   FINAL MICROSCOPIC DIAGNOSIS:   A. BREAST, RIGHT, LUMPECTOMY:  -  Invasive carcinoma with mixed ductal and lobular features, 3.1 cm,  [place] grade 2 of 3.  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  - Invasive carcinoma broadly involves each the anterior, posterior,  superior and inferior margins.  - DCIS involves the inferior margin and is < 1 mm from each the anterior  and superior margins.  - Atypical lobular hyperplasia.  - Biopsy sites.  - See oncology table.   B. TARGETED LYMPH NODE, RIGHT AXILLA, BIOPSY:  - Metastatic carcinoma in (1) of (1) lymph node.  - Biopsy site.   C. SENTINEL LYMPH NODE, RIGHT AXILLA #1, BIOPSY:  - Metastatic carcinoma in (1) of (1) lymph node.   D. SENTINEL LYMPH NODE, RIGHT AXILLA #2, BIOPSY:  - One lymph node, negative for carcinoma (0/1).    04/15/2020 Cancer Staging   Staging form: Breast, AJCC 8th Edition - Pathologic stage from 04/15/2020: No Stage Recommended (ypT2, pN2a, cM0, G2, ER+, PR+, HER2-) - Signed by Truitt Merle, MD on 05/27/2020 Stage prefix: Post-therapy Histologic grading system: 3 grade system Residual tumor (R): R1 - Microscopic    05/18/2020 Surgery   Right Mastectomy  A. BREAST, RIGHT, MASTECTOMY:  - Residual invasive carcinoma with mixed ductal and lobular features.       Residual invasive carcinoma broadly involves the anterior inferior  soft tissue margin.       Residual invasive carcinoma is focally < 1 mm from the deep margin.  - Residual ductal carcinoma in situ.       Residual DCIS is 3 mm from the closest margin, deep.  - Residual atypical lobular hyperplasia.  - Prior procedure site changes.  - Biopsy clip (X2).   B. BREAST IMPLANT, RIGHT, EXPLANTATION:  - Implant (gross only).   C. LYMPH NODES, RIGHT AXILLA, DISSECTION:  - Metastatic carcinoma in (2) of (12) lymph nodes.  - Prior procedure site changes in surrounding soft tissue.    06/24/2020 -  Chemotherapy    Patient is on Treatment Plan: BREAST ADJUVANT DOSE DENSE  AC Q14D / PACLITAXEL Q7D          INTERVAL HISTORY:  Caitlyn Miller is here for a follow up of breast cancer. She was last seen by me on 10/29/20. She presents to the clinic alone. She reports continued fatigue from radiation and notes she had skin changes. She reports continued neuropathy in her hands. She notes her hair is growing back, on her head more slowly than her eye brows and lashes.   All other systems were reviewed with the patient and are negative.  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   Pelvic pain 09/2008    SURGICAL HISTORY: Past Surgical History:  Procedure Laterality Date   AUGMENTATION MAMMAPLASTY Bilateral 5366   silicone    BREAST IMPLANT REMOVAL Right 05/18/2020   Procedure: REMOVAL OF RIGHT BREAST IMPLANT;  Surgeon: Donnie Mesa, MD;  Location: [place];  Service: General;  Laterality: Right;   BREAST LUMPECTOMY WITH RADIOACTIVE SEED LOCALIZATION Right 04/15/2020   Procedure: RIGHT BREAST LUMPECTOMY WITH RADIOACTIVE SEED LOCALIZATION;  Surgeon: Donnie Mesa, MD;  Location: [place];  Service: General;  Laterality: Right;   MODIFIED MASTECTOMY Right 05/18/2020   Procedure: RIGHT MODIFIED RADICAL MASTECTOMY;  Surgeon: Donnie Mesa, MD;  Location: [place];  Service: General;  Laterality: Right;   PORTACATH PLACEMENT Left 06/10/2020   Procedure: INSERTION PORT-A-CATH;  Surgeon: Donnie Mesa, MD;   Location: [place];  Service: General;  Laterality: Left;  45 MINUTES ROOM 2   RADIOACTIVE SEED [place] Right 04/15/2020   Procedure: RADIOACTIVE SEED GUIDED RIGHT AXILLARY SENTINEL LYMPH NODE DISSECTION;  Surgeon: Donnie Mesa, MD;  Location: [facility];  Service: General;  Laterality: Right;   SENTINEL NODE BIOPSY N/A 04/15/2020   Procedure: SENTINEL NODE BIOPSY;  Surgeon: Donnie Mesa, MD;  Location: [facility];  Service: General;  Laterality: N/A;   WISDOM TOOTH EXTRACTION      I have reviewed the social history and family history with the patient and they are unchanged from previous note.  ALLERGIES:  is allergic to ibuprofen.  MEDICATIONS:  Current Outpatient Medications  Medication Sig Dispense Refill   tamoxifen (NOLVADEX) 20 MG tablet Take 1 tablet (20 mg total) by mouth daily. 90 tablet 1   acetaminophen (TYLENOL) 500 MG tablet Take 500 mg by mouth every 6 (six) hours as needed for moderate pain.     clindamycin (CLINDAGEL) 1 % gel Apply topically 2 (two) times daily. 30 g 0   gabapentin (NEURONTIN) 100 MG capsule Take 1 capsule (100 mg total) by mouth at bedtime. 90 capsule 0   hydrocortisone 1 % lotion Apply 1 application topically daily as needed (rash). 118 mL 0   lidocaine (LIDODERM) 5 % Place 1 patch onto the skin daily. Remove & Discard patch within 12 hours or as directed by MD (Patient not taking: Reported on 10/06/2020) 30 patch 0   lidocaine-prilocaine (EMLA) cream Apply to affected area once (Patient not taking: Reported on 10/06/2020) 30 g 3   magnesium oxide (MAG-OX) 400 (240 Mg) MG tablet Take 1 tablet (400 mg total) by mouth daily. (Patient not taking: No sig reported) 20 tablet 0   ondansetron (ZOFRAN ODT) 8 MG disintegrating tablet Take 1 tablet (8 mg total) by mouth every 8 (eight) hours as needed for nausea or vomiting. 30 tablet 0   ondansetron (ZOFRAN) 8 MG tablet Take 1 tablet (8 mg  total) by mouth every 8 (eight) hours as needed. Start on the third day after chemotherapy. 30 tablet 2   pantoprazole (PROTONIX) 40 MG tablet TAKE 1 TABLET(40 MG) BY MOUTH DAILY 30 tablet 3   prochlorperazine (COMPAZINE) 10 MG tablet Take 1 tablet (10 mg total) by mouth every 8 (eight) hours as needed for nausea or vomiting. 30 tablet 1  traMADol (ULTRAM) 50 MG tablet Take 1 tablet (50 mg total) by mouth every 6 (six) hours as needed (mild pain). (Patient not taking: No sig reported) 30 tablet 0   venlafaxine (EFFEXOR) 37.5 MG tablet Take 1 tablet (37.5 mg total) by mouth daily. 30 tablet 30   No current facility-administered medications for this visit.    PHYSICAL EXAMINATION: ECOG PERFORMANCE STATUS: 1 - Symptomatic but completely ambulatory  Vitals:   12/16/20 1511  BP: 110/70  Pulse: 71  Resp: 16  Temp: 98.2 F (36.8 C)  SpO2: 98%   Wt Readings from Last 3 Encounters:  12/16/20 140 lb (63.5 kg)  10/29/20 138 lb 3.2 oz (62.7 kg)  10/06/20 136 lb 3.2 oz (61.8 kg)     GENERAL:alert, no distress and comfortable SKIN: skin color, texture, turgor are normal, no rashes or significant lesions EYES: normal, Conjunctiva are pink and non-injected, sclera clear Musculoskeletal:no cyanosis of digits and no clubbing  NEURO: alert & oriented x 3 with fluent speech, no focal motor/sensory deficits  LABORATORY DATA:  I have reviewed the data as listed CBC Latest Ref Rng & Units 12/16/2020 10/29/2020 09/30/2020  WBC 4.0 - 10.5 K/uL 3.7(L) 5.9 5.9  Hemoglobin 12.0 - 15.0 g/dL 11.4(L) 10.5(L) 10.0(L)  Hematocrit 36.0 - 46.0 % 34.1(L) 31.4(L) 30.0(L)  Platelets 150 - 400 K/uL 205 273 335     CMP Latest Ref Rng & Units 12/16/2020 10/29/2020 09/30/2020  Glucose 70 - 99 mg/dL 81 107(H) 79  BUN 6 - 20 mg/dL 9 11 11   Creatinine 0.44 - 1.00 mg/dL 0.70 0.62 0.64  Sodium 135 - 145 mmol/L 140 139 139  Potassium 3.5 - 5.1 mmol/L 4.2 3.9 4.1  Chloride 98 - 111 mmol/L 107 104 106  CO2 22 - 32  mmol/L 25 27 25   Calcium 8.9 - 10.3 mg/dL 9.2 9.1 9.4  Total Protein 6.5 - 8.1 g/dL 7.0 7.2 7.0  Total Bilirubin 0.3 - 1.2 mg/dL 0.4 0.4 0.4  Alkaline Phos 38 - 126 U/L 68 55 67  AST 15 - 41 U/L 34 30 60(H)  ALT 0 - 44 U/L 44 27 130(H)      RADIOGRAPHIC STUDIES: I have personally reviewed the radiological images as listed and agreed with the findings in the report. No results found.    Orders Placed This Encounter  Procedures   NM PET Image Restag (PS) Skull Base To Thigh    Standing Status:   Future    Standing Expiration Date:   12/16/2021    Order Specific Question:   If indicated for the ordered procedure, I authorize the administration of a radiopharmaceutical per Radiology protocol    Answer:   Yes    Order Specific Question:   Is the patient pregnant?    Answer:   No    Order Specific Question:   Preferred imaging location?    Answer:   [facility]   MM Digital Screening Unilat L    Standing Status:   Future    Standing Expiration Date:   12/16/2021    Order Specific Question:   Reason for Exam (SYMPTOM  OR DIAGNOSIS REQUIRED)    Answer:   screening    Order Specific Question:   Is the patient pregnant?    Answer:   No    Order Specific Question:   Preferred imaging location?    Answer:   [facility]   Cancer antigen 27.29    Standing Status:   Standing  Number of Occurrences:   20    Standing Expiration Date:   12/16/2021   All questions were answered. The patient knows to call the clinic with any problems, questions or concerns. No barriers to learning was detected. The total time spent in the appointment was 30 minutes.     Truitt Merle, MD 12/16/2020   I, Wilburn Mylar, am acting as scribe for Truitt Merle, MD.   I have reviewed the above documentation for accuracy and completeness, and I agree with the above.

## 2020-12-16 NOTE — Progress Notes (Signed)
                                                                                                                                                             Patient Name: Caitlyn Miller MRN: [id number] DOB: January 02, 1986 Referring Physician: Truitt Merle (Profile Not Attached) Date of Service: 12/14/2020 [facility], [place]                                                        End Of Treatment Note  Diagnoses: C50.411-Malignant neoplasm of upper-outer quadrant of right female breast  Cancer Staging: Stage IIA, pT2N1aM0, grade 2, ER/PR positive invasive mixed ductal and lobular carcinoma of the right breast  Intent: Curative  Radiation Treatment Dates: 10/25/2020 through 12/14/2020 Site Technique Total Dose (Gy) Dose per Fx (Gy) Completed Fx Beam Energies  Chest Wall, Right: CW_Rt 3D 50.4/50.4 1.8 28/28 6X, 10X  Chest Wall, Right: CW_Rt_SCLV 3D 50.4/50.4 1.8 28/28 6X, 10X  Chest Wall, Right: CW_Rt_Bst Electron 10/10 2 5/5 6E   Narrative: The patient tolerated radiation therapy relatively well. She developed fatigue and anticipated skin changes in the treatment field.     Plan: The patient will receive a call in about one month from the radiation oncology department. She will continue follow up with Dr. Burr Medico as well.   ________________________________________________    Carola Rhine, [facility]

## 2020-12-17 ENCOUNTER — Ambulatory Visit: Payer: Self-pay | Admitting: Surgery

## 2020-12-17 NOTE — H&P (Signed)
Caitlyn Miller is an 35 y.o. female.   Chief Complaint: Breast Cancer HPI: Malignant neoplasm of upper-outer quadrant of right breast, Stage IIA, p(T2N1aM0), ER+/PR+/HER2-, Grade II, Mammaprint luminal type A, low risk -She has a 4.7cm right breast mass at 11:00 position and two abnormal right axillary LN. 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 -Her 11/2019 breast MRI and PET scan did not indicate left breast or distant malignancy or metastasis.  -Mammaprint showed low risk disease and neoadjuvant chemotherapy is not recommended.  -She started neoadjuvant tamoxifen on 12/01/19. She tolerated poorly with nausea, and diffuse body aches. Dose reduced to $RemoveBe'10mg'fInltpVSW$  from 12/09/19 then switched to anastrozole on 01/06/20. I started her on monthly Zoladex injection on 12/09/19. -I started her on Verzenio on 01/12/2020, dose reduced to $RemoveBe'50mg'RzbqsPZfY$  am and $Remo'100mg'uZvSh$  01/27/2020 due to poor tolerance. Held Verzenio on 03/25/20 to proceed with breast surgery. -Given her 03/01/20 [place] and mammogram indicated good response to treatment, she proceeded with right lumpectomy and sentinel LN dissection on 04/15/20 with Dr Georgette Dover. Surgical pathology showed larger than expected tumor at 3.1cm of invasive carcinoma with mixed ductal and lobular features and DCIS components. She also had positive margins and 2/3 positive LNs. -She underwent mastectomy and axillary node dissection on 05/18/20 showing: residual invasive carcinoma and DCIS. She again had a positive margin and another 2/12 positive nodes. She had stage III disease.  -chest CT 06/17/20 and CT AP 07/02/20 were negative. -She had first cycle AC on 06/24/20 and tolerated very poorly with a variety of symptoms causing 2 ED visits. -We moved to weekly Taxol on 07/08/20 and completed 10/29/20. -she received adjuvant RT 10/10-11/29/22, developed severe chest pain which she had similar episodes before with  negative work up. She has recovered well -She is now ready to restart tamoxifen; she did not tolerate AI and verzenio before and declined ovarian suppression, AI and verzenio -due to her high risk of recurrence, I recommend a surveillance PET scan this month.  She would like to have her port removed after that scan.  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   Pelvic pain 09/2008    Past Surgical History:  Procedure Laterality Date   AUGMENTATION MAMMAPLASTY Bilateral 6381   silicone    BREAST IMPLANT REMOVAL Right 05/18/2020   Procedure: REMOVAL OF RIGHT BREAST IMPLANT;  Surgeon: Donnie Mesa, MD;  Location: [facility];  Service: General;  Laterality: Right;   BREAST LUMPECTOMY WITH RADIOACTIVE SEED LOCALIZATION Right 04/15/2020   Procedure: RIGHT BREAST LUMPECTOMY WITH RADIOACTIVE SEED LOCALIZATION;  Surgeon: Donnie Mesa, MD;  Location: [facility];  Service: General;  Laterality: Right;   MODIFIED MASTECTOMY Right 05/18/2020   Procedure: RIGHT MODIFIED RADICAL MASTECTOMY;  Surgeon: Donnie Mesa, MD;  Location: [facility];  Service: General;  Laterality: Right;   PORTACATH PLACEMENT Left 06/10/2020   Procedure: INSERTION PORT-A-CATH;  Surgeon: Donnie Mesa, MD;  Location: [facility];  Service: General;  Laterality: Left;  45 MINUTES ROOM 2   RADIOACTIVE SEED GUIDED AXILLARY SENTINEL LYMPH NODE Right 04/15/2020   Procedure: RADIOACTIVE SEED  GUIDED RIGHT AXILLARY SENTINEL LYMPH NODE DISSECTION;  Surgeon: Donnie Mesa, MD;  Location: [facility];  Service: General;  Laterality: Right;   SENTINEL NODE BIOPSY N/A 04/15/2020   Procedure: SENTINEL NODE BIOPSY;  Surgeon: Donnie Mesa, MD;  Location: [facility];  Service: General;  Laterality: N/A;   WISDOM TOOTH EXTRACTION      Family History  Problem Relation Age of Onset   Migraines Mother    Hypertension Father    Breast cancer Paternal Aunt 69   Colon cancer Neg Hx    Stomach cancer Neg Hx    Social History:  reports that she has never smoked. She has never used smokeless tobacco. She reports that she does not drink alcohol and does not use drugs.  Allergies:  Allergies  Allergen Reactions   Ibuprofen Nausea And Vomiting   Prior to Admission medications   Medication Sig Start Date End Date Taking? Authorizing Provider  acetaminophen (TYLENOL) 500 MG tablet Take 500 mg by mouth every 6 (six) hours as needed for moderate pain.    [provider]  clindamycin (CLINDAGEL) 1 % gel Apply topically 2 (two) times daily. 12/22/19   Truitt Merle, MD  gabapentin (NEURONTIN) 100 MG capsule Take 1 capsule (100 mg total) by mouth at bedtime. 12/16/20   Truitt Merle, MD  hydrocortisone 1 % lotion Apply 1 application topically daily as needed (rash). 09/24/20   Truitt Merle, MD  lidocaine (LIDODERM) 5 % Place 1 patch onto the skin daily. Remove & Discard patch within 12 hours or as directed by MD Patient not taking: Reported on 10/06/2020 07/02/20   Palumbo, April, MD  lidocaine-prilocaine (EMLA) cream Apply to affected area once Patient not taking: Reported on 10/06/2020 05/27/20   Truitt Merle, MD  magnesium oxide (MAG-OX) 400 (240 Mg) MG tablet Take 1 tablet (400 mg total) by mouth daily. Patient not taking: No sig reported 06/29/20   Truitt Merle, MD  ondansetron (ZOFRAN ODT) 8 MG disintegrating tablet Take 1 tablet (8 mg total) by mouth every 8 (eight) hours as  needed for nausea or vomiting. 09/30/20   Truitt Merle, MD  ondansetron (ZOFRAN) 8 MG tablet Take 1 tablet (8 mg total) by mouth every 8 (eight) hours as needed. Start on the third day after chemotherapy. 06/28/20   Truitt Merle, MD  pantoprazole (PROTONIX) 40 MG tablet TAKE 1 TABLET(40 MG) BY MOUTH DAILY 11/28/20   Truitt Merle, MD  prochlorperazine (COMPAZINE) 10 MG tablet Take 1 tablet (10 mg total) by mouth every 8 (eight) hours as needed for nausea or vomiting. 12/16/20   Truitt Merle, MD  tamoxifen (NOLVADEX) 20 MG tablet Take 1 tablet (20 mg total) by mouth daily. 12/16/20   Truitt Merle, MD  traMADol (ULTRAM) 50 MG tablet Take 1 tablet (50 mg total) by mouth every 6 (six) hours as needed (mild pain). Patient not taking: No sig reported 05/20/20   Donnie Mesa, MD  venlafaxine (EFFEXOR) 37.5 MG tablet Take 1 tablet (37.5 mg total) by mouth daily. 07/12/20   Truitt Merle, MD      Results for orders placed or performed in visit on 12/16/20 (  from the past 48 hour(s))  CMP ([facility] only)     Status: None   Collection Time: 12/16/20  2:00 PM  Result Value Ref Range   Sodium 140 135 - 145 mmol/L   Potassium 4.2 3.5 - 5.1 mmol/L   Chloride 107 98 - 111 mmol/L   CO2 25 22 - 32 mmol/L   Glucose, Bld 81 70 - 99 mg/dL    Comment: Glucose reference range applies only to samples taken after fasting for at least 8 hours.   BUN 9 6 - 20 mg/dL   Creatinine 0.70 0.44 - 1.00 mg/dL   Calcium 9.2 8.9 - 10.3 mg/dL   Total Protein 7.0 6.5 - 8.1 g/dL   Albumin 4.0 3.5 - 5.0 g/dL   AST 34 15 - 41 U/L   ALT 44 0 - 44 U/L   Alkaline Phosphatase 68 38 - 126 U/L   Total Bilirubin 0.4 0.3 - 1.2 mg/dL   GFR, Estimated >60 >60 mL/min    Comment: (NOTE) Calculated using the CKD-EPI Creatinine Equation (2021)    Anion gap 8 5 - 15    Comment: Performed at [facility], [place] [address] [place], [place] [address]  CBC with Differential ([facility] Only)     Status: Abnormal   Collection Time:  12/16/20  2:00 PM  Result Value Ref Range   WBC Count 3.7 (L) 4.0 - 10.5 K/uL   RBC 3.67 (L) 3.87 - 5.11 MIL/uL   Hemoglobin 11.4 (L) 12.0 - 15.0 g/dL   HCT 34.1 (L) 36.0 - 46.0 %   MCV 92.9 80.0 - 100.0 fL   MCH 31.1 26.0 - 34.0 pg   MCHC 33.4 30.0 - 36.0 g/dL   RDW 12.8 11.5 - 15.5 %   Platelet Count 205 150 - 400 K/uL   nRBC 0.0 0.0 - 0.2 %   Neutrophils Relative % 50 %   Neutro Abs 1.9 1.7 - 7.7 K/uL   Lymphocytes Relative 32 %   Lymphs Abs 1.2 0.7 - 4.0 K/uL   Monocytes Relative 15 %   Monocytes Absolute 0.5 0.1 - 1.0 K/uL   Eosinophils Relative 2 %   Eosinophils Absolute 0.1 0.0 - 0.5 K/uL   Basophils Relative 1 %   Basophils Absolute 0.0 0.0 - 0.1 K/uL   Immature Granulocytes 0 %   Abs Immature Granulocytes 0.01 0.00 - 0.07 K/uL    Comment: Performed at [facility], [address] [place], [place] [address]   No results found.  Review of Systems  Constitutional:  Positive for fatigue.  HENT:  Negative for ear discharge, ear pain, hearing loss and tinnitus.   Eyes:  Negative for photophobia and pain.  Respiratory:  Negative for cough and shortness of breath.   Cardiovascular:  Negative for chest pain.  Gastrointestinal:  Negative for abdominal pain, nausea and vomiting.  Genitourinary:  Negative for dysuria, flank pain, frequency and urgency.  Musculoskeletal:  Positive for myalgias. Negative for back pain and neck pain.  Neurological:  Positive for numbness. Negative for dizziness and headaches.  Hematological:  Does not bruise/bleed easily.  Psychiatric/Behavioral:  The patient is not nervous/anxious.    There were no vitals taken for this visit. Physical Exam  GENERAL:alert, no distress and comfortable SKIN: skin color, texture, turgor are normal, no rashes or significant lesions EYES: normal, Conjunctiva are pink and non-injected, sclera clear Musculoskeletal:no cyanosis of digits and no clubbing  NEURO: alert & oriented  x 3 with  fluent speech, no focal motor/sensory deficits Assessment/Plan Port removal.  The surgical procedure has been discussed with the patient.  Potential risks, benefits, alternative treatments, and expected outcomes have been explained.  All of the patient's questions at this time have been answered.  The likelihood of reaching the patient's treatment goal is good.  The patient understand the proposed surgical procedure and wishes to proceed.   Maia Petties, MD 12/17/2020, 1:27 PM

## 2020-12-20 ENCOUNTER — Telehealth: Payer: Self-pay | Admitting: Hematology

## 2020-12-20 NOTE — Telephone Encounter (Signed)
Scheduled follow-up appointments per 12/1 los. Patient's husband is aware.

## 2020-12-23 ENCOUNTER — Other Ambulatory Visit: Payer: Self-pay

## 2020-12-27 ENCOUNTER — Ambulatory Visit
Admission: RE | Admit: 2020-12-27 | Discharge: 2020-12-27 | Disposition: A | Discharge status: Home / Self Care | Payer: 59 | Source: Ambulatory Visit | Attending: Radiation Oncology | Admitting: Radiation Oncology

## 2020-12-27 DIAGNOSIS — C50411 Malignant neoplasm of upper-outer quadrant of right female breast: Secondary | ICD-10-CM | POA: Insufficient documentation

## 2020-12-27 DIAGNOSIS — Z170 Estrogen receptor positive status [ER+]: Secondary | ICD-10-CM | POA: Insufficient documentation

## 2020-12-27 NOTE — Progress Notes (Signed)
  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 During treatment she did very well with radiotherapy and did not have significant desquamation.   Impression/Plan: 1. 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 I was unable to reach the patient but left a voicemail and on the message, I  discussed that we would be happy to continue to follow her as needed, but she will also continue to follow up with Dr. Burr Medico in medical oncology. She was counseled on skin care as well as measures to avoid sun exposure to this area.        Carola Rhine, PAC

## 2021-01-02 ENCOUNTER — Other Ambulatory Visit: Payer: Self-pay | Admitting: Hematology

## 2021-01-03 ENCOUNTER — Ambulatory Visit: Payer: 59

## 2021-01-03 ENCOUNTER — Encounter: Payer: Self-pay | Admitting: Hematology

## 2021-01-06 ENCOUNTER — Encounter (HOSPITAL_COMMUNITY)
Admission: RE | Admit: 2021-01-06 | Discharge: 2021-01-06 | Disposition: A | Discharge status: Home / Self Care | Payer: 59 | Source: Ambulatory Visit | Attending: Hematology | Admitting: Hematology

## 2021-01-06 ENCOUNTER — Other Ambulatory Visit: Payer: Self-pay

## 2021-01-06 DIAGNOSIS — Z170 Estrogen receptor positive status [ER+]: Secondary | ICD-10-CM | POA: Insufficient documentation

## 2021-01-06 DIAGNOSIS — C50411 Malignant neoplasm of upper-outer quadrant of right female breast: Secondary | ICD-10-CM | POA: Insufficient documentation

## 2021-01-06 LAB — GLUCOSE, CAPILLARY: Glucose-Capillary: 88 mg/dL (ref 70–99)

## 2021-01-06 IMAGING — PT NM PET TUM IMG RESTAG (PS) SKULL BASE T - THIGH
1 of 7 series · 1 of 25 positions shown · non-contrast
Comparison: Multiple exams, including PET-CT of [DATE] along
with diagnostic CT examinations from [DATE] and [DATE]

CLINICAL DATA: Subsequent treatment strategy for right breast
cancer. Prior right mastectomy, chemotherapy completed 3 months ago,
radiation therapy completed 4 weeks ago.

EXAM:
NUCLEAR MEDICINE PET SKULL BASE TO THIGH
TECHNIQUE: 6.9 mCi F-18 FDG was injected intravenously. Full-ring PET imaging
was performed from the skull base to thigh after the radiotracer. CT
data was obtained and used for attenuation correction and anatomic
localization.
Fasting blood glucose: 88 mg/dl

[Series 4: ct sk_thigh 5.0 bf37 · axial · 5.0mm · 0.98mm/px · 1 of 209 slices shown]
[im 209/209  brain]
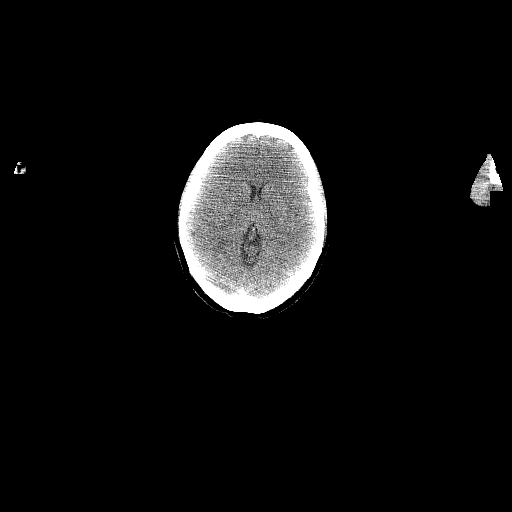

[1 of 25 positions shown; findings below may reference images not displayed]

FINDINGS: Mediastinal blood pool activity: SUV max

Liver activity: SUV max N/A

NECK: Symmetric glottic activity is thought to be physiologic. There
is new substantial multinodular activity in the fatty tissues along
the lower neck extending into the upper chest compatible with benign
hypermetabolic brown fat. No observed adenopathy in the neck.

Incidental CT findings: none

CHEST: Substantial accentuated activity tracking along adipose
tissues in the upper mediastinum, bilateral axilla, paraspinal
region and deep adipose tissues at the thoracic inlet without
observed soft tissue attenuation or adenopathy, compatible with
hypermetabolic brown fat. Prior right axillary lymph node dissection
with no significant residual adenopathy. Low-grade activity in the
subcutaneous tissues along the mastectomy site is likely
postoperative/inflammatory but merit surveillance. There is only
low-grade activity along the right anterior pectoralis margin at
this time. The previously hypermetabolic right axillary lymph nodes
are now absent.

Left breast implant noted.

Incidental CT findings: Left Port-A-Cath tip: Lower SVC.

ABDOMEN/PELVIS: Unremarkable

Incidental CT findings: none

SKELETON: No significant abnormal hypermetabolic activity in this
region.

Incidental CT findings: none
IMPRESSION: 1. No findings of active malignancy or hypermetabolic adenopathy.
Low-grade activity along the anterior right pectoralis margin in the
vicinity of the mastectomy, likely postoperative, may merit
surveillance. No overt nodularity or specific worrisome finding.
2. Substantial hypermetabolic brown fat in the neck and chest
(benign).

## 2021-01-06 MED ORDER — FLUDEOXYGLUCOSE F - 18 (FDG) INJECTION
6.9200 | Freq: Once | INTRAVENOUS | Status: AC | PRN
  Administered 2021-01-06: 08:00:00 6.92 via INTRAVENOUS

## 2021-01-11 ENCOUNTER — Telehealth: Payer: Self-pay

## 2021-01-11 ENCOUNTER — Other Ambulatory Visit: Payer: Self-pay

## 2021-01-11 NOTE — Telephone Encounter (Signed)
-----   Message from Truitt Merle, MD sent at 01/09/2021 10:41 PM EST ----- Please let pt know her PET scan was negative, no concerns. Thanks  Truitt Merle  01/09/2021

## 2021-01-11 NOTE — Progress Notes (Signed)
LVM regarding Caitlyn Miller's voicemail on 01/10/2021 about PET Scan results.  Caitlyn Miller stated she could see the scan results but does not understand the results and the Caitlyn Miller has questions regarding the results.  Informed Caitlyn Miller that Dr. Burr Medico and Cira Rue, NP are both out of the office at this time but Dr. Burr Medico will return on 01/12/2021 at which time I will ask Dr. Burr Medico to contact the Caitlyn Miller regarding her PET Scan Results.

## 2021-01-11 NOTE — Telephone Encounter (Signed)
Called patient with negative CT scan results and left message to call with any concerns. Gardiner Rhyme, RN

## 2021-01-12 ENCOUNTER — Encounter: Payer: Self-pay | Admitting: Hematology

## 2021-01-17 ENCOUNTER — Other Ambulatory Visit: Payer: Self-pay | Admitting: Hematology

## 2021-01-21 ENCOUNTER — Other Ambulatory Visit: Payer: Self-pay

## 2021-01-21 NOTE — Progress Notes (Signed)
Pt's spouse called asking for the results of his wife's PET scan.  Informed pt's husband that Dr. Burr Medico sent the pt a MyChart message on CT Scan results on 01/12/2021. Pt's husband stated he and his wife will check the pt's MyChart account.  Instructed pt and pt's spouse to please feel free to contact Dr Ernestina Penna office should they have further questions or concerns.

## 2021-01-28 ENCOUNTER — Encounter: Payer: Self-pay | Admitting: Hematology

## 2021-02-02 ENCOUNTER — Other Ambulatory Visit: Payer: Self-pay

## 2021-02-02 DIAGNOSIS — C50411 Malignant neoplasm of upper-outer quadrant of right female breast: Secondary | ICD-10-CM

## 2021-02-02 NOTE — Progress Notes (Signed)
Sent referral order and demographics to Dr. Vella Redhead w/[facility].  Telephone# [phone]  and fax# [phone].  Faxed paperwork to Dr. Hardin Negus office on 02/02/2021 per pt's and Dr. Ernestina Penna request.

## 2021-02-04 ENCOUNTER — Other Ambulatory Visit: Payer: Self-pay

## 2021-02-04 DIAGNOSIS — Z170 Estrogen receptor positive status [ER+]: Secondary | ICD-10-CM

## 2021-02-04 NOTE — Progress Notes (Signed)
New referral to Dr. Lauretta Chester sent for breast reconstruction.  Faxed to [phone] today.

## 2021-02-10 ENCOUNTER — Encounter: Payer: Self-pay | Admitting: Physical Therapy

## 2021-02-10 ENCOUNTER — Other Ambulatory Visit: Payer: Self-pay

## 2021-02-10 ENCOUNTER — Ambulatory Visit: Payer: 59 | Attending: Surgery | Admitting: Physical Therapy

## 2021-02-10 DIAGNOSIS — C50411 Malignant neoplasm of upper-outer quadrant of right female breast: Secondary | ICD-10-CM | POA: Diagnosis present

## 2021-02-10 DIAGNOSIS — I972 Postmastectomy lymphedema syndrome: Secondary | ICD-10-CM | POA: Diagnosis present

## 2021-02-10 DIAGNOSIS — Z483 Aftercare following surgery for neoplasm: Secondary | ICD-10-CM | POA: Diagnosis present

## 2021-02-10 DIAGNOSIS — Z170 Estrogen receptor positive status [ER+]: Secondary | ICD-10-CM | POA: Diagnosis present

## 2021-02-10 DIAGNOSIS — R293 Abnormal posture: Secondary | ICD-10-CM | POA: Insufficient documentation

## 2021-02-10 NOTE — Therapy (Signed)
[facility] @ [facility] [place], [place], [address] Phone: [phone]   Fax:  [phone]  Physical Therapy Treatment  Patient Details  Name: Caitlyn Miller MRN: [id number] Date of Birth: 1985/05/19 Referring Provider (PT): Dr. Rodman Key Tsuei/Feng   Encounter Date: 02/10/2021   PT End of Session - 02/10/21 0942     Visit Number 13    Number of Visits 19    Date for PT Re-Evaluation 03/24/21    PT Start Time 0805    PT Stop Time 6967    PT Time Calculation (min) 33 min    Activity Tolerance Patient tolerated treatment well    Behavior During Therapy [facility] for tasks assessed/performed             Past Medical History:  Diagnosis Date   Abnormal Pap smear    Acid reflux    Anemia    Anxiety    Breast cancer ([place])    Decreased appetite 10/08   Depression    Endometriosis 10/2004   Epigastric pain 10/2006   Family history of breast cancer 11/19/2019   FH: migraines    GBS carrier    H/O amenorrhea 06/2006   H/O dyspareunia 7/07   H/O fatigue    H/O nausea and vomiting 10/2006   H/O rubella    H/O varicella    Hyperemesis arising during pregnancy    First pregnancy   Irregular periods/menstrual cycles 08/9379   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   Pelvic pain 09/2008    Past Surgical History:  Procedure Laterality Date   AUGMENTATION MAMMAPLASTY Bilateral 2585   silicone    BREAST IMPLANT REMOVAL Right 05/18/2020   Procedure: REMOVAL OF RIGHT BREAST IMPLANT;  Surgeon: Donnie Mesa, MD;  Location: [place];  Service: General;  Laterality: Right;   BREAST LUMPECTOMY WITH RADIOACTIVE SEED LOCALIZATION Right 04/15/2020   Procedure: RIGHT BREAST LUMPECTOMY WITH RADIOACTIVE SEED LOCALIZATION;  Surgeon: Donnie Mesa, MD;  Location: [place];  Service: General;  Laterality: Right;   MODIFIED MASTECTOMY Right 05/18/2020   Procedure: RIGHT MODIFIED RADICAL MASTECTOMY;  Surgeon: Donnie Mesa,  MD;  Location: [place];  Service: General;  Laterality: Right;   PORTACATH PLACEMENT Left 06/10/2020   Procedure: INSERTION PORT-A-CATH;  Surgeon: Donnie Mesa, MD;  Location: [place];  Service: General;  Laterality: Left;  45 MINUTES ROOM 2   RADIOACTIVE SEED GUIDED AXILLARY SENTINEL LYMPH NODE Right 04/15/2020   Procedure: RADIOACTIVE SEED GUIDED RIGHT AXILLARY SENTINEL LYMPH NODE DISSECTION;  Surgeon: Donnie Mesa, MD;  Location: [place];  Service: General;  Laterality: Right;   SENTINEL NODE BIOPSY N/A 04/15/2020   Procedure: SENTINEL NODE BIOPSY;  Surgeon: Donnie Mesa, MD;  Location: [place];  Service: General;  Laterality: N/A;   WISDOM TOOTH EXTRACTION      There were no vitals filed for this visit.   Subjective Assessment - 02/10/21 0904     Subjective My arm swelled especially after the radiation. I do the massage and I wear the sleeve and glove but I am still swelling. I do not have a night time garment.    Pertinent History Patient was diagnosed on 11/05/2019 with right grade II invasive ductal carcinoma breast cancer. It measures 4.7 cm and is located in the upper outer quadrant. It is ER/PR positive and HER2 negative with a Ki67 of 5%. She has a biopsied positive axillary lymph node. She had a Right  lumpectomy with SLNB (2/3+) performed on 04/15/2020,  right modified mastectomy ALND (2/12), currently doing chemo and will need radiation    Patient Stated Goals Get back to normal use of right UE    Currently in Pain? No/denies    Pain Score 0-No pain                [facility] PT Assessment - 02/10/21 0001       AROM   Right Shoulder Flexion 169 Degrees    Right Shoulder ABduction 173 Degrees               LYMPHEDEMA/ONCOLOGY QUESTIONNAIRE - 02/10/21 0001       Right Upper Extremity Lymphedema   10 cm Proximal to Olecranon Process 26.7 cm    Olecranon Process 25.7 cm    10 cm Proximal to Ulnar Styloid Process  19.7 cm    Just Proximal to Ulnar Styloid Process 15 cm    Across Hand at [employer] 18.8 cm    At [place] of 2nd Digit 6.2 cm                                 PT Education - 02/10/21 0949     Education Details educated pt at this point she no longer has subclinical lymphedema but rather lymphedema and how to manage, discussed different types of compression sleeves (circular vs flat knit), need for night time garment, issued script and educated pt how to obtain garment, educated pt about compression pump    Person(s) Educated Patient    Methods Explanation    Comprehension Verbalized understanding                 PT Long Term Goals - 02/10/21 0906       PT LONG TERM GOAL #1   Title Patient will demonstrate she has regained near  full shoulder ROM and function post operatively compared to baselines.    Baseline End ROM improved but still limited by cording- 05/13/20; 07/15/20- pt limited at end range by tightness though ROM has improved; 09/01/20- pt is no longer limited at end range    Time 6    Period Weeks    Status Achieved      PT LONG TERM GOAL #2   Title Pt will have decreased pain by atleast 50%    Baseline Pt reports no pain at this time as motion improves-05/13/20    Time 3    Period Weeks    Status Achieved    Target Date 05/20/20      PT LONG TERM GOAL #3   Title Pt will have quick dash no greater than 20% for return to function prior to next surgery    Baseline 70%; pt did not retake this today but reports no longer feeling limited with reaching and ADLs-05/13/20; 09/01/20- 13.64    Time 3    Period Weeks    Status Achieved      PT LONG TERM GOAL #4   Title pt will be able to dress, bathe and perform home activities with improved ease    Time 3    Period Weeks    Status Achieved      PT LONG TERM GOAL #5   Title Pt will be independent in a home exercise program for continued strengthening and stretching so she can avoid increased  tightness with radiation    Time 6  Period Weeks    Status On-going      PT LONG TERM GOAL #6   Title Pt will be independent with self MLD for long term management of lymphedema.    Time 8    Period Weeks    Status On-going      PT LONG TERM GOAL #7   Title Pt will obtain appropriate compression garments for management of lymphedema.    Time 8    Period Weeks    Status On-going      PT LONG TERM GOAL #8   Title Pt will report no shoulder pain during the day to allow her to return to PLOF.    Time 8    Period Weeks    Status Achieved                   Plan - 02/10/21 0942     Clinical Impression Statement Pt retuns to PT after completing radiation. She has been wearing her compression sleeve daily and has been doing the self MLD. She feels her arm is still swollen. Remeasured her R UE circumferences today and they are similar to Nov 2022. Most have decreased slightly but her upper arm has increased slightly. Her RUE is visibly larger than the L side. Her current compression sleeve is a 20-30 circular soft knit. Educated pt that she needs a 30-40 circular knit or a 20-30 flat knit. Showed pt different samples today and she decided a flat knit would be best. Also educated pt about importance of a night time garment and sent pt with a script to get measured for a flat knit sleeve and glove as well as a night time garment (CircAid Profile). Educated pt that at this point she no longer has subclinical lymphedema but has developed lymphedema and will require treatment. Educated pt about availability of a compression pump which she was interested in and issued info on this. Will send demongraphic info to [place] per pt request to see if her insurance will cover it. Pt would benefit from skilled PT services to ensure that pt is independent in self MLD and obtains appropriate compression garments for long term management of her lymphedema prior to her breast reconstruction sugery.     PT Frequency 1x / week    PT Duration 6 weeks    PT Treatment/Interventions ADLs/Self Care Home Management;Therapeutic exercise;Patient/family education;Manual techniques;Manual lymph drainage;Scar mobilization;Passive range of motion;Orthotic Fit/Training;Compression bandaging    PT Next Visit Plan see if pt made appt at [place], see if pt is indep in self MLD, instruct in strength ABC    PT Home Exercise Plan Post op shoulder ROM HEP (supine for flex and stargazer), supine dowel flex and abd, supine over foam roll    Recommended Other Services issued rx to pt to go to a special place and be measured for flat knit medi espirit sleeve and glove and circaid profile night time garment    Consulted and Agree with Plan of Care Patient             Patient will benefit from skilled therapeutic intervention in order to improve the following deficits and impairments:  Postural dysfunction, Decreased knowledge of precautions, Increased fascial restricitons, Increased edema  Visit Diagnosis: Postmastectomy lymphedema  Aftercare following surgery for neoplasm  Abnormal posture  Malignant neoplasm of upper-outer quadrant of right breast in female, estrogen receptor positive [facility])     Problem List Patient Active Problem List   Diagnosis Date  Noted   Port-A-Cath in place 09/24/2020   Menopausal syndrome (hot flushes) 14/10/3011   Monoallelic mutation of HYH88I gene 12/19/2019   Genetic testing 11/26/2019   Family history of breast cancer 11/19/2019   Malignant neoplasm of upper-outer quadrant of right breast in female, estrogen receptor positive ([place]) 11/17/2019   S/P breast augmentation 09/18/2019   Hx of migraines 06/20/2011   GERD (gastroesophageal reflux disease) 06/20/2011   Anemia 06/20/2011    Manus Gunning, PT 02/10/2021, 9:50 AM  [place] @ [place] [place], [place], [address] Phone: [phone]    Fax:  [phone]  Name: Caitlyn Miller MRN: [id number] Date of Birth: [birth year]

## 2021-02-18 ENCOUNTER — Telehealth: Payer: Self-pay | Admitting: *Deleted

## 2021-02-22 ENCOUNTER — Inpatient Hospital Stay: Payer: 59

## 2021-02-22 ENCOUNTER — Inpatient Hospital Stay: Payer: 59 | Attending: Hematology | Admitting: Nurse Practitioner

## 2021-02-22 ENCOUNTER — Encounter: Payer: Self-pay | Admitting: Nurse Practitioner

## 2021-02-22 ENCOUNTER — Other Ambulatory Visit: Payer: Self-pay

## 2021-02-22 VITALS — BP 108/74 | HR 86 | Temp 98.4°F | Resp 18 | Ht 64.0 in | Wt 135.6 lb

## 2021-02-22 DIAGNOSIS — Z170 Estrogen receptor positive status [ER+]: Secondary | ICD-10-CM | POA: Insufficient documentation

## 2021-02-22 DIAGNOSIS — C50411 Malignant neoplasm of upper-outer quadrant of right female breast: Secondary | ICD-10-CM | POA: Diagnosis not present

## 2021-02-22 DIAGNOSIS — Z79810 Long term (current) use of selective estrogen receptor modulators (SERMs): Secondary | ICD-10-CM | POA: Insufficient documentation

## 2021-02-22 DIAGNOSIS — Z1231 Encounter for screening mammogram for malignant neoplasm of breast: Secondary | ICD-10-CM | POA: Diagnosis not present

## 2021-02-22 DIAGNOSIS — G8929 Other chronic pain: Secondary | ICD-10-CM

## 2021-02-22 DIAGNOSIS — M549 Dorsalgia, unspecified: Secondary | ICD-10-CM | POA: Diagnosis not present

## 2021-02-22 DIAGNOSIS — C773 Secondary and unspecified malignant neoplasm of axilla and upper limb lymph nodes: Secondary | ICD-10-CM | POA: Diagnosis not present

## 2021-02-22 NOTE — Progress Notes (Signed)
CLINIC:  Survivorship   Patient Care Team: Pcp, No as PCP - General Mansouraty, Telford Nab., MD as Consulting Physician (Gastroenterology) Mauro Kaufmann, RN as Oncology Nurse Navigator Rockwell Germany, RN as Oncology Nurse Navigator Donnie Mesa, MD as Consulting Physician (General Surgery) Truitt Merle, MD as Consulting Physician (Hematology) Kyung Rudd, MD as Consulting Physician (Radiation Oncology) Contogiannis, Audrea Muscat, MD as Consulting Physician (Plastic Surgery) Alla Feeling, NP as Nurse Practitioner (Nurse Practitioner)   REASON FOR VISIT:  Routine follow-up post-treatment for a recent history of breast cancer.  BRIEF ONCOLOGIC HISTORY:  Oncology History Overview Note  Cancer Staging Malignant neoplasm of upper-outer quadrant of right breast in female, estrogen receptor positive ([place]) Staging form: Breast, AJCC 8th Edition - Clinical stage from 11/12/2019: Stage IIA (cT2, cN1, cM0, G2, ER+, PR+, HER2-) - Signed by Truitt Merle, MD on 11/19/2019 Stage prefix: Initial diagnosis Histologic grading system: 3 grade system - Pathologic stage from 04/15/2020: No Stage Recommended (ypT2, pN2a, cM0, G2, ER+, PR+, HER2-) - Signed by Truitt Merle, MD on 05/27/2020 Stage prefix: Post-therapy Histologic grading system: 3 grade system Residual tumor (R): R1 - Microscopic    Malignant neoplasm of upper-outer quadrant of right breast in female, estrogen receptor positive ([place])  11/05/2019 Mammogram   IMPRESSION: Large irregular palpable mass 4.7 x 1.4 x 1.4 cm within the upper-outer right breast 11 o'clock position, 4 cm from nipple.  There is a large area of associated coarse heterogeneous calcifications within the mass. Overall findings are concerning for breast carcinoma.   Two cortically thickened right axillary lymph nodes which are indeterminate in etiology.   11/12/2019 Cancer Staging   Staging form: Breast, AJCC 8th Edition - Clinical stage from 11/12/2019: Stage IIA  (cT2, cN1, cM0, G2, ER+, PR+, HER2-) - Signed by Truitt Merle, MD on 11/19/2019    11/12/2019 Initial Biopsy   Diagnosis 1. Breast, right, needle core biopsy, right - INVASIVE MAMMARY CARCINOMA, SEE COMMENT. - MAMMARY CARCINOMA IN SITU. 2. Lymph node, needle/core biopsy, right - METASTATIC MAMMARY CARCINOMA. Microscopic Comment 1. The carcinoma appears grade 2 and measures 16 mm in greatest linear extent. E-cadherin will be ordered. Prognostic makers will be ordered. Dr. Saralyn Pilar has reviewed the case. The case was called to [place] on 01/13/2020.    1. E-cadherin is strongly positive consistent with a ductal phenotype.   11/12/2019 Receptors her2   1. PROGNOSTIC INDICATORS Results: IMMUNOHISTOCHEMICAL AND MORPHOMETRIC ANALYSIS PERFORMED MANUALLY The tumor cells are NEGATIVE for Her2 (0). Estrogen Receptor: 100%, POSITIVE, STRONG STAINING INTENSITY Progesterone Receptor: 100%, POSITIVE, STRONG STAINING INTENSITY Proliferation Marker Ki67: 5%   11/17/2019 Initial Diagnosis   Malignant neoplasm of upper-outer quadrant of right breast in female, estrogen receptor positive ([place])   11/25/2019 Breast MRI   IMPRESSION: 1. Large area of non-mass enhancement involving the UPPER OUTER QUADRANT of the RIGHT breast measuring approximately 4.2 x 4.1 x 2.4 cm. The biopsy-proven IDC and DCIS is present at the superomedial aspect of this NME. 2. No MRI evidence of malignancy involving the LEFT breast. 3. Intact BILATERAL retropectoral implants. 4. 2 pathologically enlarged RIGHT axillary lymph nodes. One of these nodes is biopsy-proven metastatic disease. No pathologic lymphadenopathy elsewhere   11/25/2019 PET scan   IMPRESSION: 1. Two mildly enlarged hypermetabolic right axillary lymph nodes compatible with right axillary nodal metastases. 2. No additional sites of hypermetabolic metastatic disease. 3. Asymmetric indistinct upper right breast hypermetabolism,  without discrete mass correlate on the CT images, compatible with  known primary right breast malignancy.     11/26/2019 Genetic Testing   Negative genetic testing: no pathogenic variants detected in [facility] STAT Breast Cancer Panel.  Variant of uncertain significance (VUS) detected in CHEK2 at c.1556G>T (p.Arg519Leu).  The report date is November 26, 2019.   The STAT Breast cancer panel offered by [facility] includes sequencing and rearrangement analysis for the following 9 genes:  ATM, BRCA1, BRCA2, CDH1, CHEK2, PALB2, PTEN, STK11 and TP53.    Results of [facility] Multi-Cancer Panel are pending.    12/01/2019 -  Anti-estrogen oral therapy   Neoadjuvant Tamoxifen 42m on 12/01/19. Reduced to 161mon 12/09/19.       ---Zoladex injection monthly starting 12/09/19.       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  Variants of uncertain significance detected RAD51D at c.715C>T (p.Arg239Trp) and CHEK2 at c.1556G>T (p.Arg519Leu).  The report date is December 08, 2019.   The Multi-Cancer Panel offered by [facility] includes sequencing and/or deletion duplication testing of the following 85 genes: AIP, ALK, APC, ATM, AXIN2,BAP1,  BARD1, BLM, BMPR1A, BRCA1, BRCA2, BRIP1, CASR, CDC73, CDH1, CDK4, CDKN1B, CDKN1C, CDKN2A (p14ARF), CDKN2A (p16INK4a), CEBPA, CHEK2, CTNNA1, DICER1, DIS3L2, EGFR (c.2369C>T, p.Thr790Met variant only), EPCAM (Deletion/duplication testing only), FH, FLCN, GATA2, GPC3, GREM1 (Promoter region deletion/duplication testing only), HOXB13 (c.251G>A, p.Gly84Glu), HRAS, KIT, MAX, MEN1, MET, MITF (c.952G>A, p.Glu318Lys variant only), MLH1, MSH2, MSH3, MSH6, MUTYH, NBN, NF1, NF2, NTHL1, PALB2, PDGFRA, PHOX2B, PMS2, POLD1, POLE, POT1, PRKAR1A, PTCH1, PTEN, RAD50, RAD51C, RAD51D, RB1, RECQL4, RET, RNF43, RUNX1, SDHAF2, SDHA (sequence changes  only), SDHB, SDHC, SDHD, SMAD4, SMARCA4, SMARCB1, SMARCE1, STK11, SUFU, TERC, TERT, TMEM127, TP53, TSC1, TSC2, VHL, WRN and WT1.    12/09/2019 Miscellaneous   Mammaprint luminal type A, low risk  10% risk of recurrence in 10 years with 97.8% benefor of hormaonal therapy alone.    01/12/2020 - 03/25/2020 Chemotherapy   Verzenio started on 01/12/2020, dose reduced to 5075mm and 100m66m11/2022 due to poor tolerance. Held Verzenio on 03/25/20 to proceed with breast surgery.    03/01/2020 Mammogram   Targeted ultrasound is performed, showing interval decreasing conspicuity of the patient's known right breast cancer. A post biopsy clip with an associated irregular area shadowing is demonstrated at the 11 o'clock position 4 cm from the nipple. Exact measurements are difficult due to the vague appearance of the findings. It measures approximately 1.2 x 0.9 x 0.6 cm (previously 4.7 x 2.2 x 1.5 cm).   IMPRESSION: Imaging findings consistent with good response to chemotherapy.   03/16/2020 Imaging   MRI Breast  IMPRESSION: 1. Interval resolution of RIGHT axillary adenopathy. 2. Slightly smaller area of non mass enhancement in the UPPER-OUTER QUADRANT of the RIGHT breast.   04/15/2020 Surgery   RIGHT BREAST LUMPECTOMY WITH RADIOACTIVE SEED LOCALIZATION and RADIOACTIVE SEED GUIDED RIGHT AXILLARY SENTINEL LYMPH NODE DISSECTION and SENTINEL NODE BIOPSY by Dr TsueGeorgette Dover3/31/2022 Pathology Results   FINAL MICROSCOPIC DIAGNOSIS:   A. 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  - Invasive carcinoma broadly involves each the anterior, posterior,  superior and inferior margins.  - DCIS involves the inferior margin and is < 1 mm from each the anterior  and superior margins.  -  Atypical lobular hyperplasia.  - Biopsy sites.   - See oncology table.   B. TARGETED LYMPH NODE, RIGHT AXILLA, BIOPSY:  - Metastatic carcinoma in (1) of (1) lymph node.  - Biopsy site.   C. SENTINEL LYMPH NODE, RIGHT AXILLA #1, BIOPSY:  - Metastatic carcinoma in (1) of (1) lymph node.   D. SENTINEL LYMPH NODE, RIGHT AXILLA #2, BIOPSY:  - One lymph node, negative for carcinoma (0/1).    04/15/2020 Cancer Staging   Staging form: Breast, AJCC 8th Edition - Pathologic stage from 04/15/2020: No Stage Recommended (ypT2, pN2a, cM0, G2, ER+, PR+, HER2-) - Signed by Truitt Merle, MD on 05/27/2020 Stage prefix: Post-therapy Histologic grading system: 3 grade system Residual tumor (R): R1 - Microscopic    05/18/2020 Surgery   Right Mastectomy  A. BREAST, RIGHT, MASTECTOMY:  - Residual invasive carcinoma with mixed ductal and lobular features.       Residual invasive carcinoma broadly involves the anterior inferior  soft tissue margin.       Residual invasive carcinoma is focally < 1 mm from the deep margin.  - Residual ductal carcinoma in situ.       Residual DCIS is 3 mm from the closest margin, deep.  - Residual atypical lobular hyperplasia.  - Prior procedure site changes.  - Biopsy clip (X2).   B. BREAST IMPLANT, RIGHT, EXPLANTATION:  - Implant (gross only).   C. LYMPH NODES, RIGHT AXILLA, DISSECTION:  - Metastatic carcinoma in (2) of (12) lymph nodes.  - Prior procedure site changes in surrounding soft tissue.    06/24/2020 -  Chemotherapy    Patient is on Treatment Plan: BREAST ADJUVANT DOSE DENSE AC Q14D / PACLITAXEL Q7D         INTERVAL HISTORY:  Caitlyn Miller presents to the [facility] today for our initial meeting to review her survivorship care plan detailing her treatment course for breast cancer, as well as monitoring long-term side effects of that treatment, education regarding health maintenance, screening, and overall wellness and health promotion.     Overall, Caitlyn Miller is doing well. She has residual  chemo side effects including neuropathy and fatigue which are improving. Her menses have not returned. She continues tamoxifen. Hot flashes and mood are "better" on gabapentin and effexor. She has intermittent back pain that she had prior to breast cancer. Limited right arm ROM is improving with PT. Denies other pain. She is having right breast plastic surgery on 4/18. She needs left mammo prior to that. Denies breast concerns. Just had breast exam at plastic surgeon's office.    ONCOLOGY TREATMENT TEAM:  1. Surgeon:  Dr. Georgette Dover at [facility] 2. Medical Oncologist: Dr. Burr Medico  3. Radiation Oncologist: Dr. 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  Pelvic pain 09/2008   Past Surgical History:  Procedure Laterality Date   AUGMENTATION MAMMAPLASTY Bilateral 7371   silicone    BREAST IMPLANT REMOVAL Right 05/18/2020   Procedure: REMOVAL OF RIGHT BREAST IMPLANT;  Surgeon: Donnie Mesa, MD;  Location: [place];  Service: General;  Laterality: Right;   BREAST LUMPECTOMY WITH RADIOACTIVE SEED LOCALIZATION Right 04/15/2020   Procedure: RIGHT BREAST LUMPECTOMY WITH RADIOACTIVE SEED LOCALIZATION;  Surgeon: Donnie Mesa, MD;  Location: [place];  Service: General;  Laterality: Right;   MODIFIED MASTECTOMY Right 05/18/2020   Procedure: RIGHT MODIFIED RADICAL MASTECTOMY;  Surgeon: Donnie Mesa, MD;   Location: [place];  Service: General;  Laterality: Right;   PORTACATH PLACEMENT Left 06/10/2020   Procedure: INSERTION PORT-A-CATH;  Surgeon: Donnie Mesa, MD;  Location: [place];  Service: General;  Laterality: Left;  45 MINUTES ROOM 2   RADIOACTIVE SEED [place] Right 04/15/2020   Procedure: RADIOACTIVE SEED GUIDED RIGHT AXILLARY SENTINEL LYMPH NODE DISSECTION;  Surgeon: Donnie Mesa, MD;  Location: [place];  Service: General;  Laterality: Right;   SENTINEL NODE BIOPSY N/A 04/15/2020   Procedure: SENTINEL NODE BIOPSY;  Surgeon: Donnie Mesa, MD;  Location: [place];  Service: General;  Laterality: N/A;   WISDOM TOOTH EXTRACTION       ALLERGIES:  Allergies  Allergen Reactions   Ibuprofen Nausea And Vomiting     CURRENT MEDICATIONS:  Outpatient Encounter Medications as of 02/22/2021  Medication Sig Note   acetaminophen (TYLENOL) 500 MG tablet Take 500 mg by mouth every 6 (six) hours as needed for moderate pain.    clindamycin (CLINDAGEL) 1 % gel Apply topically 2 (two) times daily.    gabapentin (NEURONTIN) 100 MG capsule TAKE 2 CAPSULES(200 MG) BY MOUTH AT BEDTIME    hydrocortisone 1 % lotion Apply 1 application topically daily as needed (rash).    lidocaine (LIDODERM) 5 % Place 1 patch onto the skin daily. Remove & Discard patch within 12 hours or as directed by MD (Patient not taking: Reported on 10/06/2020)    lidocaine-prilocaine (EMLA) cream Apply to affected area once (Patient not taking: Reported on 10/06/2020) 06/17/2020: Not started   magnesium oxide (MAG-OX) 400 (240 Mg) MG tablet Take 1 tablet (400 mg total) by mouth daily. (Patient not taking: No sig reported)    ondansetron (ZOFRAN ODT) 8 MG disintegrating tablet Take 1 tablet (8 mg total) by mouth every 8 (eight) hours as needed for nausea or vomiting.    ondansetron (ZOFRAN) 8 MG tablet Take 1 tablet (8 mg total) by mouth every 8 (eight) hours as  needed. Start on the third day after chemotherapy.    pantoprazole (PROTONIX) 40 MG tablet TAKE 1 TABLET(40 MG) BY MOUTH DAILY    prochlorperazine (COMPAZINE) 10 MG tablet TAKE 1 TABLET(10 MG) BY MOUTH EVERY 8 HOURS AS NEEDED FOR NAUSEA OR VOMITING    tamoxifen (NOLVADEX) 20 MG tablet Take 1 tablet (20 mg total) by mouth daily.    traMADol (ULTRAM) 50 MG tablet Take 1 tablet (50 mg total) by mouth every 6 (six) hours as needed (mild pain). (Patient not taking: No sig reported) 07/02/2020: 1/2 tab as of 06/17   venlafaxine (EFFEXOR) 37.5 MG tablet Take 1 tablet (37.5 mg total) by mouth daily.    No facility-administered encounter medications on file as of 02/22/2021.     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  She denies any current or history of tobacco, alcohol, or illicit drug use.     PHYSICAL EXAMINATION:  Vital Signs:   Vitals:   02/22/21 1050  BP: 108/74  Pulse: 86  Resp: 18  Temp: 98.4 F (36.9 C)  SpO2: 99%   Filed Weights   02/22/21 1050  Weight: 135 lb 9.6 oz (61.5 kg)   General: Well-nourished, well-appearing female in no acute distress.   HEENT: Sclerae anicteric.   Respiratory: breathing non-labored.  Neuro: No focal deficits. Steady gait.  Psych: Mood and affect normal and appropriate for situation.  Extremities: No edema. Breast exam: pt declined   LABORATORY DATA:  CA 27.29 pending   DIAGNOSTIC IMAGING:  None for this visit.      ASSESSMENT AND PLAN:  Ms.. Miller is a pleasant 36 y.o. 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  She presents to the Survivorship Clinic for our initial meeting and routine follow-up post-completion of treatment for breast cancer.    1. Stage III right breast cancer:  Caitlyn Miller is continuing to recover from definitive treatment for breast cancer. She has R breast reconstruction surgery 4/18, we will proceed with left mammogram prior to that. She understands to hold tamoxifen 2 weeks prior surgery and resume 3-4 weeks post op if she has recovered well. She will follow-up with her medical oncologist, Dr. Burr Medico in 05/2021 with history and physical exam per surveillance protocol.  She will continue her anti-estrogen therapy with tamoxifen. Thus far, she is tolerating moderately well, with minimal side effects including hot flash, mood fluctuation, and fatigue. She was instructed to make Dr. Burr Medico or myself aware if she begins to experience any worsening side effects of the medication and I could see her back in clinic to help manage those side effects, as needed. Though the incidence is low, there is an associated risk of endometrial cancer with anti-estrogen therapies like Tamoxifen.  Caitlyn Miller was encouraged to contact Dr. Burr Medico or myself with any vaginal bleeding while taking Tamoxifen.  She plans to schedule follow-up with her OB/GYN. Other side effects of Tamoxifen were again reviewed with her as well. Today, a comprehensive survivorship care plan and treatment summary was reviewed with the patient today detailing her breast cancer diagnosis, treatment course, potential late/long-term effects of treatment, appropriate follow-up care with recommendations for the future, and patient education resources.  A copy of this summary, along with a letter will be sent to the patients primary care provider via mail/fax/In Basket message after  todays visit.    2.  Residual chemo and tamoxifen side effects: Including fatigue, neuropathy, hot flash, mood all improving and well managed on supportive regimen at home.  3.  Back pain, present prior to breast cancer diagnosis and treatment: No abnormal PET finding in the area. We discussed tamoxifen can contribute, but pain was present prior to any cancer treatment.  I will refer her to Ortho.   4. Bone health:  Given  Caitlyn Miller's premenopausal status and current therapy with tamoxifen, she does not need to begin DEXA screening yet.  In the meantime, she was encouraged to increase her consumption of foods rich in calcium, as well as increase her weight-bearing activities.  She was given education on specific activities to promote bone health.  5. Cancer screening:  Due to Caitlyn Miller's history and her age, she should receive screening for skin cancers and gynecologic cancers. She will call her ob/gyn to schedule f/up. We discussed ovarian cancer risk in patient's with pathogenic RAD51D mutation. She is not interested in BSO at this time. She should begin colon cancer screening at age 45, or sooner if she develops red flags.  The information and recommendations are listed on the patient's comprehensive care plan/treatment summary and were reviewed in detail with the patient.    6. Health maintenance and wellness promotion: Caitlyn Miller was encouraged to consume 5-7 servings of fruits and vegetables per day. We reviewed the "Nutrition Rainbow" handout. She was also encouraged to engage in moderate to vigorous exercise for 30 minutes per day most days of the week. We discussed the [employer] fitness program, which is designed for cancer survivors to help them become more physically fit after cancer treatments.  She was instructed to limit her alcohol consumption and continue to abstain from tobacco use. She is a Brewing technologist, knows the guidelines well.      7. Support services/counseling: It is  not uncommon for this period of the patient's cancer care trajectory to be one of many emotions and stressors.  We discussed an opportunity for her to participate in the next session of [facility] ("Finding Your New Normal") support group series designed for patients after they have completed treatment.   Caitlyn Miller was encouraged to take advantage of our many other support services programs, support groups, and/or counseling in coping with her new life as a cancer survivor after completing anti-cancer treatment.  She was offered support today through active listening and expressive supportive counseling.  She was given information regarding our available services and encouraged to contact me with any questions or for help enrolling in any of our support group/programs.    Dispo:   -reconstruction surgery 4/18, hold tamoxifen 2 weeks prior and resume 3-4 weeks post op if recovering well -proceed with L screening mammo -Return to cancer center 05/16/21  -Follow up with surgery as scheduled  -Refer to ortho for back pain -pt to call ob/gyn to schedule f/up -She is welcome to return back to the Survivorship Clinic at any time; no additional follow-up needed at this time.  -Consider referral back to survivorship as a long-term survivor for continued surveillance  A total of (40) minutes of face-to-face time was spent with this patient with greater than 50% of that time in counseling and care-coordination.   Cira Rue, NP Survivorship Program [facility] [phone]   Note: PRIMARY CARE PROVIDER Pcp, No None None

## 2021-02-23 ENCOUNTER — Encounter: Payer: Self-pay | Admitting: Physical Therapy

## 2021-02-23 ENCOUNTER — Ambulatory Visit: Payer: 59 | Attending: Surgery | Admitting: Physical Therapy

## 2021-02-23 DIAGNOSIS — I972 Postmastectomy lymphedema syndrome: Secondary | ICD-10-CM | POA: Insufficient documentation

## 2021-02-23 DIAGNOSIS — Z170 Estrogen receptor positive status [ER+]: Secondary | ICD-10-CM | POA: Insufficient documentation

## 2021-02-23 DIAGNOSIS — R293 Abnormal posture: Secondary | ICD-10-CM | POA: Insufficient documentation

## 2021-02-23 DIAGNOSIS — C50411 Malignant neoplasm of upper-outer quadrant of right female breast: Secondary | ICD-10-CM | POA: Insufficient documentation

## 2021-02-23 DIAGNOSIS — Z483 Aftercare following surgery for neoplasm: Secondary | ICD-10-CM | POA: Insufficient documentation

## 2021-02-23 LAB — CANCER ANTIGEN 27.29: CA 27.29: 5 U/mL (ref 0.0–38.6)

## 2021-02-23 NOTE — Therapy (Signed)
[facility] @ [place], [place], [address] Phone: [phone]   Fax:  [phone]  Physical Therapy Treatment  Patient Details  Name: Caitlyn Miller MRN: [id number] Date of Birth: [birth year] Referring Provider (PT): Dr. Rodman Key Tsuei/Feng   Encounter Date: 02/23/2021   PT End of Session - 02/23/21 1043     Visit Number 14    Number of Visits 19    Date for PT Re-Evaluation 03/24/21    PT Start Time 1009    PT Stop Time 1041   pt needed to leave early due to having another appt at 30   PT Time Calculation (min) 32 min    Activity Tolerance Patient tolerated treatment well    Behavior During Therapy [facility] for tasks assessed/performed             Past Medical History:  Diagnosis Date   Abnormal Pap smear    Acid reflux    Anemia    Anxiety    Breast cancer ([place])    Decreased appetite 10/08   Depression    Endometriosis 10/2004   Epigastric pain 10/2006   Family history of breast cancer 11/19/2019   FH: migraines    GBS carrier    H/O amenorrhea 06/2006   H/O dyspareunia 7/07   H/O fatigue    H/O nausea and vomiting 10/2006   H/O rubella    H/O varicella    Hyperemesis arising during pregnancy    First pregnancy   Irregular periods/menstrual cycles 04/9700   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   Pelvic pain 09/2008    Past Surgical History:  Procedure Laterality Date   AUGMENTATION MAMMAPLASTY Bilateral 8502   silicone    BREAST IMPLANT REMOVAL Right 05/18/2020   Procedure: REMOVAL OF RIGHT BREAST IMPLANT;  Surgeon: Donnie Mesa, MD;  Location: [place];  Service: General;  Laterality: Right;   BREAST LUMPECTOMY WITH RADIOACTIVE SEED LOCALIZATION Right 04/15/2020   Procedure: RIGHT BREAST LUMPECTOMY WITH RADIOACTIVE SEED LOCALIZATION;  Surgeon: Donnie Mesa, MD;  Location: [place];  Service: General;  Laterality: Right;   MODIFIED MASTECTOMY Right 05/18/2020   Procedure:  RIGHT MODIFIED RADICAL MASTECTOMY;  Surgeon: Donnie Mesa, MD;  Location: [place];  Service: General;  Laterality: Right;   PORTACATH PLACEMENT Left 06/10/2020   Procedure: INSERTION PORT-A-CATH;  Surgeon: Donnie Mesa, MD;  Location: [place];  Service: General;  Laterality: Left;  45 MINUTES ROOM 2   RADIOACTIVE SEED GUIDED AXILLARY SENTINEL LYMPH NODE Right 04/15/2020   Procedure: RADIOACTIVE SEED GUIDED RIGHT AXILLARY SENTINEL LYMPH NODE DISSECTION;  Surgeon: Donnie Mesa, MD;  Location: [place];  Service: General;  Laterality: Right;   SENTINEL NODE BIOPSY N/A 04/15/2020   Procedure: SENTINEL NODE BIOPSY;  Surgeon: Donnie Mesa, MD;  Location: [place];  Service: General;  Laterality: N/A;   WISDOM TOOTH EXTRACTION      There were no vitals filed for this visit.   Subjective Assessment - 02/23/21 1010     Subjective My arm is doing better. I got the Flexi Touch and they are going to train me on it today. I am going to pick up the sleeve and glove today.    Pertinent History Patient was diagnosed on 11/05/2019 with right grade II invasive ductal carcinoma breast cancer. It measures 4.7 cm and is located in the upper outer quadrant. It is ER/PR positive and HER2 negative with a Ki67 of  5%. She has a biopsied positive axillary lymph node. She had a Right lumpectomy with SLNB (2/3+) performed on 04/15/2020,  right modified mastectomy ALND (2/12), currently doing chemo and will need radiation    Patient Stated Goals Get back to normal use of right UE    Currently in Pain? No/denies    Pain Score 0-No pain                               [facility] Adult PT Treatment/Exercise - 02/23/21 0001       Manual Therapy   Manual Lymphatic Drainage (MLD) in supine: short neck, 5 diaphramatic breaths, left axillary nodes and establishment of interaxillary pathway, right inguinal nodes and establishment of axillo inguinal pathway, R UE  working proximal to distal then retracing all steps                          PT Long Term Goals - 02/10/21 0906       PT LONG TERM GOAL #1   Title Patient will demonstrate she has regained near  full shoulder ROM and function post operatively compared to baselines.    Baseline End ROM improved but still limited by cording- 05/13/20; 07/15/20- pt limited at end range by tightness though ROM has improved; 09/01/20- pt is no longer limited at end range    Time 6    Period Weeks    Status Achieved      PT LONG TERM GOAL #2   Title Pt will have decreased pain by atleast 50%    Baseline Pt reports no pain at this time as motion improves-05/13/20    Time 3    Period Weeks    Status Achieved    Target Date 05/20/20      PT LONG TERM GOAL #3   Title Pt will have quick dash no greater than 20% for return to function prior to next surgery    Baseline 70%; pt did not retake this today but reports no longer feeling limited with reaching and ADLs-05/13/20; 09/01/20- 13.64    Time 3    Period Weeks    Status Achieved      PT LONG TERM GOAL #4   Title pt will be able to dress, bathe and perform home activities with improved ease    Time 3    Period Weeks    Status Achieved      PT LONG TERM GOAL #5   Title Pt will be independent in a home exercise program for continued strengthening and stretching so she can avoid increased tightness with radiation    Time 6    Period Weeks    Status On-going      PT LONG TERM GOAL #6   Title Pt will be independent with self MLD for long term management of lymphedema.    Time 8    Period Weeks    Status On-going      PT LONG TERM GOAL #7   Title Pt will obtain appropriate compression garments for management of lymphedema.    Time 8    Period Weeks    Status On-going      PT LONG TERM GOAL #8   Title Pt will report no shoulder pain during the day to allow her to return to PLOF.    Time 8    Period Weeks    Status Achieved  Plan - 02/23/21 1046     Clinical Impression Statement Pt has received her FlexiTouch pump and they will be educating her on how to use it today. Pt is also planning on picking up her new flat knit sleeve and glove and night time garment today. Pt has been wearing her sleeve consistently and her arm is visibly reduced today. Continued with MLD to RUE today. Pt will be ready for discharge once she is able to independently manage her lymphedema with her new compression garments and FlexiTouch.    PT Frequency 1x / week    PT Duration 6 weeks    PT Treatment/Interventions ADLs/Self Care Home Management;Therapeutic exercise;Patient/family education;Manual techniques;Manual lymph drainage;Scar mobilization;Passive range of motion;Orthotic Fit/Training;Compression bandaging    PT Next Visit Plan see if pt made brought garment, see if pt is indep in self MLD, instruct in strength ABC    PT Home Exercise Plan Post op shoulder ROM HEP (supine for flex and stargazer), supine dowel flex and abd, supine over foam roll    Consulted and Agree with Plan of Care Patient             Patient will benefit from skilled therapeutic intervention in order to improve the following deficits and impairments:  Postural dysfunction, Decreased knowledge of precautions, Increased fascial restricitons, Increased edema  Visit Diagnosis: Postmastectomy lymphedema  Aftercare following surgery for neoplasm  Abnormal posture  Malignant neoplasm of upper-outer quadrant of right breast in female, estrogen receptor positive ([place])     Problem List Patient Active Problem List   Diagnosis Date Noted   Port-A-Cath in place 09/24/2020   Menopausal syndrome (hot flushes) 42/87/6811   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

## 2021-02-26 ENCOUNTER — Other Ambulatory Visit: Payer: Self-pay | Admitting: Hematology

## 2021-03-02 ENCOUNTER — Encounter: Payer: 59 | Admitting: Physical Therapy

## 2021-03-08 ENCOUNTER — Encounter: Payer: Self-pay | Admitting: Physical Therapy

## 2021-03-08 ENCOUNTER — Ambulatory Visit: Payer: 59 | Admitting: Physical Therapy

## 2021-03-08 ENCOUNTER — Other Ambulatory Visit: Payer: Self-pay

## 2021-03-08 DIAGNOSIS — I972 Postmastectomy lymphedema syndrome: Secondary | ICD-10-CM

## 2021-03-08 DIAGNOSIS — R293 Abnormal posture: Secondary | ICD-10-CM

## 2021-03-08 DIAGNOSIS — C50411 Malignant neoplasm of upper-outer quadrant of right female breast: Secondary | ICD-10-CM

## 2021-03-08 DIAGNOSIS — Z483 Aftercare following surgery for neoplasm: Secondary | ICD-10-CM

## 2021-03-08 NOTE — Therapy (Addendum)
[facility] @ [place], [place], [address] Phone: [phone]   Fax:  [phone]  Physical Therapy Treatment  Patient Details  Name: Caitlyn Miller MRN: [id number] Date of Birth: 11/04/1985 Referring Provider (PT): Dr. Rodman Key Tsuei/Feng   Encounter Date: 03/08/2021   PT End of Session - 03/08/21 1304     Visit Number 15    Number of Visits 19    Date for PT Re-Evaluation 03/24/21    PT Start Time 5465    PT Stop Time 6812    PT Time Calculation (min) 52 min    Activity Tolerance Patient tolerated treatment well    Behavior During Therapy [facility] for tasks assessed/performed             Past Medical History:  Diagnosis Date   Abnormal Pap smear    Acid reflux    Anemia    Anxiety    Breast cancer ([place])    Decreased appetite 10/08   Depression    Endometriosis 10/2004   Epigastric pain 10/2006   Family history of breast cancer 11/19/2019   FH: migraines    GBS carrier    H/O amenorrhea 06/2006   H/O dyspareunia 7/07   H/O fatigue    H/O nausea and vomiting 10/2006   H/O rubella    H/O varicella    Hyperemesis arising during pregnancy    First pregnancy   Irregular periods/menstrual cycles 07/5168   Monoallelic mutation of YFV49S gene 12/19/2019   Pelvic pain 09/2008    Past Surgical History:  Procedure Laterality Date   AUGMENTATION MAMMAPLASTY Bilateral 4967   silicone    BREAST IMPLANT REMOVAL Right 05/18/2020   Procedure: REMOVAL OF RIGHT BREAST IMPLANT;  Surgeon: Donnie Mesa, MD;  Location: [place];  Service: General;  Laterality: Right;   BREAST LUMPECTOMY WITH RADIOACTIVE SEED LOCALIZATION Right 04/15/2020   Procedure: RIGHT BREAST LUMPECTOMY WITH RADIOACTIVE SEED LOCALIZATION;  Surgeon: Donnie Mesa, MD;  Location: [place];  Service: General;  Laterality: Right;   MODIFIED MASTECTOMY Right 05/18/2020   Procedure: RIGHT MODIFIED RADICAL MASTECTOMY;  Surgeon: Donnie Mesa,  MD;  Location: [place];  Service: General;  Laterality: Right;   PORTACATH PLACEMENT Left 06/10/2020   Procedure: INSERTION PORT-A-CATH;  Surgeon: Donnie Mesa, MD;  Location: [place];  Service: General;  Laterality: Left;  45 MINUTES ROOM 2   RADIOACTIVE SEED GUIDED AXILLARY SENTINEL LYMPH NODE Right 04/15/2020   Procedure: RADIOACTIVE SEED GUIDED RIGHT AXILLARY SENTINEL LYMPH NODE DISSECTION;  Surgeon: Donnie Mesa, MD;  Location: [place];  Service: General;  Laterality: Right;   SENTINEL NODE BIOPSY N/A 04/15/2020   Procedure: SENTINEL NODE BIOPSY;  Surgeon: Donnie Mesa, MD;  Location: [facility];  Service: General;  Laterality: N/A;   WISDOM TOOTH EXTRACTION      There were no vitals filed for this visit.   Subjective Assessment - 03/08/21 1304     Subjective I got my night time garment but I am still waiting on my day time garment. It has been a month.    Pertinent History Patient was diagnosed on 11/05/2019 with right grade II invasive ductal carcinoma breast cancer. It measures 4.7 cm and is located in the upper outer quadrant. It is ER/PR positive and HER2 negative with a Ki67 of 5%. She has a biopsied positive axillary lymph node. She had a Right lumpectomy with SLNB (2/3+) performed on 04/15/2020,  right modified  mastectomy ALND (2/12), currently doing chemo and will need radiation    Patient Stated Goals Get back to normal use of right UE    Currently in Pain? No/denies    Pain Score 0-No pain                               [facility] Adult PT Treatment/Exercise - 03/08/21 0001       Manual Therapy   Manual Lymphatic Drainage (MLD) in supine: short neck, 5 diaphramatic breaths, left axillary nodes and establishment of interaxillary pathway, right inguinal nodes and establishment of axillo inguinal pathway, R UE working proximal to distal then retracing all steps                          PT Long  Term Goals - 02/10/21 0906       PT LONG TERM GOAL #1   Title Patient will demonstrate she has regained near  full shoulder ROM and function post operatively compared to baselines.    Baseline End ROM improved but still limited by cording- 05/13/20; 07/15/20- pt limited at end range by tightness though ROM has improved; 09/01/20- pt is no longer limited at end range    Time 6    Period Weeks    Status Achieved      PT LONG TERM GOAL #2   Title Pt will have decreased pain by atleast 50%    Baseline Pt reports no pain at this time as motion improves-05/13/20    Time 3    Period Weeks    Status Achieved    Target Date 05/20/20      PT LONG TERM GOAL #3   Title Pt will have quick dash no greater than 20% for return to function prior to next surgery    Baseline 70%; pt did not retake this today but reports no longer feeling limited with reaching and ADLs-05/13/20; 09/01/20- 13.64    Time 3    Period Weeks    Status Achieved      PT LONG TERM GOAL #4   Title pt will be able to dress, bathe and perform home activities with improved ease    Time 3    Period Weeks    Status Achieved      PT LONG TERM GOAL #5   Title Pt will be independent in a home exercise program for continued strengthening and stretching so she can avoid increased tightness with radiation    Time 6    Period Weeks    Status On-going      PT LONG TERM GOAL #6   Title Pt will be independent with self MLD for long term management of lymphedema.    Time 8    Period Weeks    Status On-going      PT LONG TERM GOAL #7   Title Pt will obtain appropriate compression garments for management of lymphedema.    Time 8    Period Weeks    Status On-going      PT LONG TERM GOAL #8   Title Pt will report no shoulder pain during the day to allow her to return to PLOF.    Time 8    Period Weeks    Status Achieved                   Plan - 03/08/21 1413  Clinical Impression Statement Pt brought her night time  garment in and therapist assessed fit. She still has not received her day time compression sleeve and glove and it has been over a month. Therapist attempted to contact DME company regarding this but they said they would call back with details. Continued with MLD to RUE today. Pt also has swelling in right 2nd digit so educated pt to wear her glove and issued elastomull for pt to bandage her finger as needed.    PT Frequency 1x / week    PT Duration 6 weeks    PT Treatment/Interventions ADLs/Self Care Home Management;Therapeutic exercise;Patient/family education;Manual techniques;Manual lymph drainage;Scar mobilization;Passive range of motion;Orthotic Fit/Training;Compression bandaging    PT Next Visit Plan see if pt got flat knit sleeve adn glove, see if pt is indep in self MLD, instruct in strength ABC    PT Home Exercise Plan Post op shoulder ROM HEP (supine for flex and stargazer), supine dowel flex and abd, supine over foam roll    Consulted and Agree with Plan of Care Patient             Patient will benefit from skilled therapeutic intervention in order to improve the following deficits and impairments:  Postural dysfunction, Decreased knowledge of precautions, Increased fascial restricitons, Increased edema  Visit Diagnosis: Postmastectomy lymphedema  Aftercare following surgery for neoplasm  Abnormal posture  Malignant neoplasm of upper-outer quadrant of right breast in female, estrogen receptor positive ([place])     Problem List Patient Active Problem List   Diagnosis Date Noted   Port-A-Cath in place 09/24/2020   Menopausal syndrome (hot flushes) 42/35/3614   Monoallelic mutation of ERX54M gene 12/19/2019   Genetic testing 11/26/2019   Family history of breast cancer 11/19/2019   Malignant neoplasm of upper-outer quadrant of right breast in female, estrogen receptor positive ([place]) 11/17/2019   S/P breast augmentation 09/18/2019   Hx of migraines 06/20/2011   GERD  (gastroesophageal reflux disease) 06/20/2011   Anemia 06/20/2011    Manus Gunning, PT 03/08/2021, 2:26 PM  [facility] @ [place], [place], [address] Phone: [phone]   Fax:  [phone]  Name: Caitlyn Miller MRN: [id number] Date of Birth: 1985/01/31   PHYSICAL THERAPY DISCHARGE SUMMARY  Visits from Start of Care: 15  Current functional level related to goals / functional outcomes: See above   Remaining deficits: See above   Education / Equipment: Lymphedema education, self MLD, compression garments   Patient agrees to discharge. Patient goals were not met. Patient is being discharged due to not returning since the last visit.  Allyson Sabal [place], [place] 06/20/21 2:29 PM

## 2021-03-16 ENCOUNTER — Encounter: Payer: 59 | Admitting: Physical Therapy

## 2021-03-23 ENCOUNTER — Encounter: Payer: 59 | Admitting: Physical Therapy

## 2021-03-24 ENCOUNTER — Other Ambulatory Visit: Payer: Self-pay

## 2021-03-24 ENCOUNTER — Telehealth: Payer: Self-pay

## 2021-03-24 NOTE — Telephone Encounter (Signed)
Pt's spouse called this morning requesting his wife be seen regarding 2 bruises that are on her bilateral legs.  Pt stated the bruises have been there for 2 wks.  Pt described the bruises as blue in color and think they are related to her Tamoxifen medication.  Pt denied bumping into anything and is insisting on being seen by a provider.  This pt is a breast cancer pt that has completed chemotherapy and radiation.  Sent staff message to APP staff to see if they could see this pt in clinic. ?

## 2021-03-30 ENCOUNTER — Ambulatory Visit: Payer: 59 | Attending: Surgery | Admitting: Physical Therapy

## 2021-03-30 DIAGNOSIS — C50411 Malignant neoplasm of upper-outer quadrant of right female breast: Secondary | ICD-10-CM | POA: Insufficient documentation

## 2021-03-30 DIAGNOSIS — R293 Abnormal posture: Secondary | ICD-10-CM | POA: Insufficient documentation

## 2021-03-30 DIAGNOSIS — I972 Postmastectomy lymphedema syndrome: Secondary | ICD-10-CM | POA: Insufficient documentation

## 2021-03-30 DIAGNOSIS — Z483 Aftercare following surgery for neoplasm: Secondary | ICD-10-CM | POA: Insufficient documentation

## 2021-03-30 DIAGNOSIS — Z170 Estrogen receptor positive status [ER+]: Secondary | ICD-10-CM | POA: Insufficient documentation

## 2021-04-05 ENCOUNTER — Other Ambulatory Visit: Payer: Self-pay | Admitting: *Deleted

## 2021-04-05 DIAGNOSIS — Z1231 Encounter for screening mammogram for malignant neoplasm of breast: Secondary | ICD-10-CM

## 2021-04-05 DIAGNOSIS — C50411 Malignant neoplasm of upper-outer quadrant of right female breast: Secondary | ICD-10-CM

## 2021-04-07 ENCOUNTER — Other Ambulatory Visit: Payer: Self-pay

## 2021-04-07 ENCOUNTER — Inpatient Hospital Stay: Payer: 59 | Attending: Hematology

## 2021-04-07 ENCOUNTER — Inpatient Hospital Stay: Payer: 59

## 2021-04-07 ENCOUNTER — Encounter: Payer: Self-pay | Admitting: Adult Health

## 2021-04-07 ENCOUNTER — Inpatient Hospital Stay (HOSPITAL_BASED_OUTPATIENT_CLINIC_OR_DEPARTMENT_OTHER): Payer: 59 | Admitting: Adult Health

## 2021-04-07 VITALS — BP 115/69 | HR 93 | Temp 97.7°F | Resp 16 | Ht 64.0 in | Wt 136.1 lb

## 2021-04-07 DIAGNOSIS — D649 Anemia, unspecified: Secondary | ICD-10-CM | POA: Diagnosis not present

## 2021-04-07 DIAGNOSIS — C50411 Malignant neoplasm of upper-outer quadrant of right female breast: Secondary | ICD-10-CM | POA: Diagnosis present

## 2021-04-07 DIAGNOSIS — Z170 Estrogen receptor positive status [ER+]: Secondary | ICD-10-CM | POA: Insufficient documentation

## 2021-04-07 DIAGNOSIS — R233 Spontaneous ecchymoses: Secondary | ICD-10-CM

## 2021-04-07 DIAGNOSIS — R5383 Other fatigue: Secondary | ICD-10-CM | POA: Insufficient documentation

## 2021-04-07 DIAGNOSIS — Z79810 Long term (current) use of selective estrogen receptor modulators (SERMs): Secondary | ICD-10-CM | POA: Diagnosis not present

## 2021-04-07 LAB — CMP (CANCER CENTER ONLY)
ALT: 38 U/L (ref 0–44)
AST: 23 U/L (ref 15–41)
Albumin: 4.2 g/dL (ref 3.5–5.0)
Alkaline Phosphatase: 60 U/L (ref 38–126)
Anion gap: 8 (ref 5–15)
BUN: 13 mg/dL (ref 6–20)
CO2: 26 mmol/L (ref 22–32)
Calcium: 9.5 mg/dL (ref 8.9–10.3)
Chloride: 105 mmol/L (ref 98–111)
Creatinine: 0.63 mg/dL (ref 0.44–1.00)
GFR, Estimated: >60 mL/min (ref >60)
Glucose, Bld: 93 mg/dL (ref 70–99)
Potassium: 4 mmol/L (ref 3.5–5.1)
Sodium: 139 mmol/L (ref 135–145)
Total Bilirubin: 0.4 mg/dL (ref 0.3–1.2)
Total Protein: 6.9 g/dL (ref 6.5–8.1)

## 2021-04-07 LAB — CBC WITH DIFFERENTIAL (CANCER CENTER ONLY)
Abs Immature Granulocytes: 0.01 10*3/uL (ref 0.00–0.07)
Basophils Absolute: 0 10*3/uL (ref 0.0–0.1)
Basophils Relative: 0 %
Eosinophils Absolute: 0.1 10*3/uL (ref 0.0–0.5)
Eosinophils Relative: 1 %
HCT: 34.7 % — ABNORMAL LOW (ref 36.0–46.0)
Hemoglobin: 11.6 g/dL — ABNORMAL LOW (ref 12.0–15.0)
Immature Granulocytes: 0 %
Lymphocytes Relative: 46 %
Lymphs Abs: 2.1 10*3/uL (ref 0.7–4.0)
MCH: 30.4 pg (ref 26.0–34.0)
MCHC: 33.4 g/dL (ref 30.0–36.0)
MCV: 90.8 fL (ref 80.0–100.0)
Monocytes Absolute: 0.5 10*3/uL (ref 0.1–1.0)
Monocytes Relative: 10 %
Neutro Abs: 2 10*3/uL (ref 1.7–7.7)
Neutrophils Relative %: 43 %
Platelet Count: 228 10*3/uL (ref 150–400)
RBC: 3.82 MIL/uL — ABNORMAL LOW (ref 3.87–5.11)
RDW: 13 % (ref 11.5–15.5)
WBC Count: 4.6 10*3/uL (ref 4.0–10.5)
nRBC: 0 % (ref 0.0–0.2)

## 2021-04-07 LAB — VITAMIN D 25 HYDROXY (VIT D DEFICIENCY, FRACTURES): Vit D, 25-Hydroxy: 23.81 ng/mL — ABNORMAL LOW (ref 30–100)

## 2021-04-07 LAB — TSH: TSH: 2.205 u[IU]/mL (ref 0.308–3.960)

## 2021-04-07 LAB — PROTIME-INR
INR: 1.1 (ref 0.8–1.2)
Prothrombin Time: 13.8 seconds (ref 11.4–15.2)

## 2021-04-07 LAB — VITAMIN B12: Vitamin B-12: 217 pg/mL (ref 180–914)

## 2021-04-07 LAB — APTT: aPTT: 26 seconds (ref 24–36)

## 2021-04-07 MED ORDER — CLINDAMYCIN PHOSPHATE 1 % EX GEL: Freq: Two times a day (BID) | CUTANEOUS | 0 refills | Status: DC

## 2021-04-07 NOTE — Progress Notes (Signed)
[facility] Follow up: ?  ? ?Pcp, No ?No address on file ? ? ?DIAGNOSIS:  Cancer Staging  ?Malignant neoplasm of upper-outer quadrant of right breast in female, estrogen receptor positive ([place]) ?Staging form: Breast, AJCC 8th Edition ?- Clinical stage from 11/12/2019: Stage IIA (cT2, cN1, cM0, G2, ER+, PR+, HER2-) - Signed by Truitt Merle, MD on 11/19/2019 ?Stage prefix: Initial diagnosis ?Histologic grading system: 3 grade system ?- Pathologic stage from 04/15/2020: No Stage Recommended (ypT2, pN2a, cM0, G2, ER+, PR+, HER2-) - Signed by Truitt Merle, MD on 05/27/2020 ?Stage prefix: Post-therapy ?Histologic grading system: 3 grade system ?Residual tumor (R): R1 - Microscopic ? ? ?SUMMARY OF ONCOLOGIC HISTORY: ?Oncology History Overview Note  ?Cancer Staging ?Malignant neoplasm of upper-outer quadrant of right breast in female, estrogen receptor positive ([place]) ?Staging form: Breast, AJCC 8th Edition ?- Clinical stage from 11/12/2019: Stage IIA (cT2, cN1, cM0, G2, ER+, PR+, HER2-) - Signed by Truitt Merle, MD on 11/19/2019 ?Stage prefix: Initial diagnosis ?Histologic grading system: 3 grade system ?- Pathologic stage from 04/15/2020: No Stage Recommended (ypT2, pN2a, cM0, G2, ER+, PR+, HER2-) - Signed by Truitt Merle, MD on 05/27/2020 ?Stage prefix: Post-therapy ?Histologic grading system: 3 grade system ?Residual tumor (R): R1 - Microscopic ? ?  ?Malignant neoplasm of upper-outer quadrant of right breast in female, estrogen receptor positive ([place])  ?11/05/2019 Mammogram  ? IMPRESSION: ?Large irregular palpable mass 4.7 x 1.4 x 1.4 cm within the upper-outer right breast 11 ?o'clock position, 4 cm from nipple.  ?There is a large area of associated coarse ?heterogeneous calcifications within the mass. Overall findings are ?concerning for breast carcinoma. ?  ?Two cortically thickened right axillary lymph nodes which are ?indeterminate in etiology. ?  ?11/12/2019 Cancer Staging  ? Staging form: Breast, AJCC 8th  Edition ?- Clinical stage from 11/12/2019: Stage IIA (cT2, cN1, cM0, G2, ER+, PR+, HER2-) - Signed by Truitt Merle, MD on 11/19/2019 ? ?  ?11/12/2019 Initial Biopsy  ? Diagnosis ?1. Breast, right, needle core biopsy, right ?- INVASIVE MAMMARY CARCINOMA, SEE COMMENT. ?- MAMMARY CARCINOMA IN SITU. ?2. Lymph node, needle/core biopsy, right ?- METASTATIC MAMMARY CARCINOMA. ?Microscopic Comment ?1. The carcinoma appears grade 2 and measures 16 mm in greatest linear extent. E-cadherin will be ordered. Prognostic ?makers will be ordered. Dr. Saralyn Pilar has reviewed the case. The case was called to [facility] ?on 01/13/2020. ? ? ? ?1. E-cadherin is strongly positive consistent with a ductal phenotype. ?  ?11/12/2019 Receptors her2  ? 1. PROGNOSTIC INDICATORS ?Results: ?IMMUNOHISTOCHEMICAL AND MORPHOMETRIC ANALYSIS PERFORMED MANUALLY ?The tumor cells are NEGATIVE for Her2 (0). ?Estrogen Receptor: 100%, POSITIVE, STRONG STAINING INTENSITY ?Progesterone Receptor: 100%, POSITIVE, STRONG STAINING INTENSITY ?Proliferation Marker Ki67: 5% ?  ?11/17/2019 Initial Diagnosis  ? Malignant neoplasm of upper-outer quadrant of right breast in female, estrogen receptor positive ([place]) ?  ?11/25/2019 Breast MRI  ? IMPRESSION: ?1. Large area of non-mass enhancement involving the UPPER OUTER ?QUADRANT of the RIGHT breast measuring approximately 4.2 x 4.1 x 2.4 ?cm. The biopsy-proven IDC and DCIS is present at the superomedial ?aspect of this NME. ?2. No MRI evidence of malignancy involving the LEFT breast. ?3. Intact BILATERAL retropectoral implants. ?4. 2 pathologically enlarged RIGHT axillary lymph nodes. One of ?these nodes is biopsy-proven metastatic disease. No pathologic ?lymphadenopathy elsewhere ?  ?11/25/2019 PET scan  ? IMPRESSION: ?1. Two mildly enlarged hypermetabolic right axillary lymph nodes ?compatible with right axillary nodal metastases. ?2. No additional sites of hypermetabolic metastatic disease. ?  3. Asymmetric  indistinct upper right breast hypermetabolism, without ?discrete mass correlate on the CT images, compatible with known ?primary right breast malignancy. ?  ?  ?11/26/2019 Genetic Testing  ? Negative genetic testing: no pathogenic variants detected in [facility] STAT Breast Cancer Panel.  Variant of uncertain significance (VUS) detected in CHEK2 at c.1556G>T (p.Arg519Leu).  The report date is November 26, 2019.  ? ?The STAT Breast cancer panel offered by [facility] includes sequencing and rearrangement analysis for the following 9 genes:  ATM, BRCA1, BRCA2, CDH1, CHEK2, PALB2, PTEN, STK11 and TP53.   ? ?Results of [facility] Multi-Cancer Panel are pending.  ?  ?12/01/2019 -  Anti-estrogen oral therapy  ? Neoadjuvant Tamoxifen 88m on 12/01/19. Reduced to 182mon 12/09/19.  ?     ---Zoladex injection monthly starting 12/09/19.  ?     ---switched Tamoxifen to anastrozole on 01/06/20 ?  ?12/08/2019 Genetic Testing  ? 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.  Variants of uncertain significance detected RAD51D at c.715C>T (p.Arg239Trp) and CHEK2 at c.1556G>T (p.Arg519Leu).  The report date is December 08, 2019.  ? ?The Multi-Cancer Panel offered by [facility] includes sequencing and/or deletion duplication testing of the following 85 genes: AIP, ALK, APC, ATM, AXIN2,BAP1,  BARD1, BLM, BMPR1A, BRCA1, BRCA2, BRIP1, CASR, CDC73, CDH1, CDK4, CDKN1B, CDKN1C, CDKN2A (p14ARF), CDKN2A (p16INK4a), CEBPA, CHEK2, CTNNA1, DICER1, DIS3L2, EGFR (c.2369C>T, p.Thr790Met variant only), EPCAM (Deletion/duplication testing only), FH, FLCN, GATA2, GPC3, GREM1 (Promoter region deletion/duplication testing only), HOXB13 (c.251G>A, p.Gly84Glu), HRAS, KIT, MAX, MEN1, MET, MITF (c.952G>A, p.Glu318Lys variant only), MLH1, MSH2, MSH3, MSH6, MUTYH, NBN, NF1, NF2, NTHL1, PALB2, PDGFRA, PHOX2B, PMS2, POLD1, POLE, POT1, PRKAR1A, PTCH1, PTEN, RAD50, RAD51C, RAD51D, RB1, RECQL4, RET,  RNF43, RUNX1, SDHAF2, SDHA (sequence changes only), SDHB, SDHC, SDHD, SMAD4, SMARCA4, SMARCB1, SMARCE1, STK11, SUFU, TERC, TERT, TMEM127, TP53, TSC1, TSC2, VHL, WRN and WT1.  ?  ?12/09/2019 Miscellaneous  ? Mammaprint luminal type A, low risk ? ?10% risk of recurrence in 10 years with 97.8% benefor of hormaonal therapy alone.  ?  ?01/12/2020 - 03/25/2020 Chemotherapy  ? Verzenio started on 01/12/2020, dose reduced to 5081mm and 100m13m11/2022 due to poor tolerance. Held Verzenio on 03/25/20 to proceed with breast surgery.  ?  ?03/01/2020 Mammogram  ? Targeted ultrasound is performed, showing interval decreasing ?conspicuity of the patient's known right breast cancer. A post ?biopsy clip with an associated irregular area shadowing is ?demonstrated at the 11 o'clock position 4 cm from the nipple. Exact ?measurements are difficult due to the vague appearance of the ?findings. It measures approximately 1.2 x 0.9 x 0.6 cm (previously ?4.7 x 2.2 x 1.5 cm). ?  ?IMPRESSION: ?Imaging findings consistent with good response to chemotherapy. ?  ?03/16/2020 Imaging  ? MRI Breast  ?IMPRESSION: ?1. Interval resolution of RIGHT axillary adenopathy. ?2. Slightly smaller area of non mass enhancement in the UPPER-OUTER ?QUADRANT of the RIGHT breast. ?  ?04/15/2020 Surgery  ? RIGHT BREAST LUMPECTOMY WITH RADIOACTIVE SEED LOCALIZATION and RADIOACTIVE SEED GUIDED RIGHT AXILLARY SENTINEL LYMPH NODE DISSECTION and SENTINEL NODE BIOPSY by Dr TsueGeorgette Dover ?04/15/2020 Pathology Results  ? FINAL MICROSCOPIC DIAGNOSIS:  ? ?A. 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  ?- Invasive carcinoma broadly involves each the anterior, posterior,  ?superior and inferior margins.  ?- DCIS  involves the inferior margin and is < 1 mm from each the anterior  ?and superior margins.  ?-  Atypical lobular hyperplasia.  ?- Biopsy sites.  ?- See oncology table.  ? ?B. TARGETED LYMPH NODE, RIGHT AXILLA, BIOPSY:  ?- Metastatic carcinoma in (1) of (1) lymph node.  ?- Biopsy site.  ? ?C. SENTINEL LYMPH N

## 2021-04-07 NOTE — Assessment & Plan Note (Signed)
Continues on tamoxifen with no problems.  Will continue this.  No sign of recurrence, follow-up with reconstruction next month. ?

## 2021-04-07 NOTE — Assessment & Plan Note (Signed)
Patient CBC is relatively normal.  She is slightly anemic with a hemoglobin of 11.6, however this has improved steadily since finishing her chemotherapy.  This is the best its ever been.  I let MA know that she would probably feel more fatigued if her hemoglobin was less than 9 but typically when it is above 9 or 10 patient still generally well if that is the only thing that is going on. ? ?Her kidney function liver function electrolytes are normal on her c-Met and I reviewed that with her in detail.  I would like to get a couple of additional tests to evaluate with certainty how she is doing since she did have considerable fatigue and bruising.  We will get PTT and PT/INR, along with a TSH, vitamin D level, and vitamin B12 level.  I let her know that I will result these to her in my chart as that is her preferred method of communication. ? ?We will follow-up with her about the lab results.  I wished her the best with her upcoming surgery. ?

## 2021-04-11 ENCOUNTER — Telehealth: Payer: Self-pay

## 2021-04-11 NOTE — Telephone Encounter (Signed)
-----   Message from Gardenia Phlegm, NP sent at 04/08/2021 12:46 PM EDT ----- ?Please let patient know that her vitamin d level is low.  If she is not taking any supplementation she should get otc vitamin d3, 2000 IU daily.  ?----- Message ----- ?From: Interface, [facility] [place] ?Sent: 04/07/2021   1:37 PM EDT ?To: Gardenia Phlegm, NP ? ? ?

## 2021-04-11 NOTE — Telephone Encounter (Signed)
Results were given to pt per NP request. Pt asks if she can have VitD that you take once monthly as she had before. Advised I woulf speak with NP and we would contact her regarding this request.  ?

## 2021-04-28 ENCOUNTER — Other Ambulatory Visit: Payer: Self-pay | Admitting: Hematology

## 2021-05-15 ENCOUNTER — Other Ambulatory Visit: Payer: Self-pay | Admitting: Hematology

## 2021-05-15 DIAGNOSIS — R5383 Other fatigue: Secondary | ICD-10-CM

## 2021-05-15 DIAGNOSIS — Z170 Estrogen receptor positive status [ER+]: Secondary | ICD-10-CM

## 2021-05-16 ENCOUNTER — Inpatient Hospital Stay (HOSPITAL_BASED_OUTPATIENT_CLINIC_OR_DEPARTMENT_OTHER): Payer: 59 | Admitting: Hematology

## 2021-05-16 ENCOUNTER — Encounter: Payer: Self-pay | Admitting: Hematology

## 2021-05-16 ENCOUNTER — Other Ambulatory Visit: Payer: Self-pay

## 2021-05-16 ENCOUNTER — Inpatient Hospital Stay: Payer: 59 | Attending: Hematology

## 2021-05-16 VITALS — BP 123/70 | HR 86 | Temp 98.3°F | Resp 19 | Ht 64.0 in | Wt 138.7 lb

## 2021-05-16 DIAGNOSIS — Z170 Estrogen receptor positive status [ER+]: Secondary | ICD-10-CM | POA: Diagnosis not present

## 2021-05-16 DIAGNOSIS — F419 Anxiety disorder, unspecified: Secondary | ICD-10-CM | POA: Insufficient documentation

## 2021-05-16 DIAGNOSIS — C50411 Malignant neoplasm of upper-outer quadrant of right female breast: Secondary | ICD-10-CM | POA: Insufficient documentation

## 2021-05-16 DIAGNOSIS — F32A Depression, unspecified: Secondary | ICD-10-CM | POA: Diagnosis not present

## 2021-05-16 DIAGNOSIS — G620 Drug-induced polyneuropathy: Secondary | ICD-10-CM | POA: Diagnosis not present

## 2021-05-16 DIAGNOSIS — R5383 Other fatigue: Secondary | ICD-10-CM

## 2021-05-16 LAB — COMPREHENSIVE METABOLIC PANEL
ALT: 54 U/L — ABNORMAL HIGH (ref 0–44)
AST: 35 U/L (ref 15–41)
Albumin: 4 g/dL (ref 3.5–5.0)
Alkaline Phosphatase: 58 U/L (ref 38–126)
Anion gap: 7 (ref 5–15)
BUN: 11 mg/dL (ref 6–20)
CO2: 27 mmol/L (ref 22–32)
Calcium: 9.1 mg/dL (ref 8.9–10.3)
Chloride: 107 mmol/L (ref 98–111)
Creatinine, Ser: 0.57 mg/dL (ref 0.44–1.00)
GFR, Estimated: >60 mL/min (ref >60)
Glucose, Bld: 111 mg/dL — ABNORMAL HIGH (ref 70–99)
Potassium: 3.6 mmol/L (ref 3.5–5.1)
Sodium: 141 mmol/L (ref 135–145)
Total Bilirubin: 0.3 mg/dL (ref 0.3–1.2)
Total Protein: 6.7 g/dL (ref 6.5–8.1)

## 2021-05-16 LAB — CBC WITH DIFFERENTIAL/PLATELET
Abs Immature Granulocytes: 0.03 10*3/uL (ref 0.00–0.07)
Basophils Absolute: 0 10*3/uL (ref 0.0–0.1)
Basophils Relative: 1 %
Eosinophils Absolute: 0.1 10*3/uL (ref 0.0–0.5)
Eosinophils Relative: 2 %
HCT: 33.2 % — ABNORMAL LOW (ref 36.0–46.0)
Hemoglobin: 11.3 g/dL — ABNORMAL LOW (ref 12.0–15.0)
Immature Granulocytes: 0 %
Lymphocytes Relative: 25 %
Lymphs Abs: 1.8 10*3/uL (ref 0.7–4.0)
MCH: 31.8 pg (ref 26.0–34.0)
MCHC: 34 g/dL (ref 30.0–36.0)
MCV: 93.5 fL (ref 80.0–100.0)
Monocytes Absolute: 0.5 10*3/uL (ref 0.1–1.0)
Monocytes Relative: 6 %
Neutro Abs: 5 10*3/uL (ref 1.7–7.7)
Neutrophils Relative %: 66 %
Platelets: 353 10*3/uL (ref 150–400)
RBC: 3.55 MIL/uL — ABNORMAL LOW (ref 3.87–5.11)
RDW: 12.6 % (ref 11.5–15.5)
WBC: 7.5 10*3/uL (ref 4.0–10.5)
nRBC: 0 % (ref 0.0–0.2)

## 2021-05-16 LAB — FERRITIN: Ferritin: 52 ng/mL (ref 11–307)

## 2021-05-16 MED ORDER — TAMOXIFEN CITRATE 20 MG PO TABS: 20.0000 mg | ORAL_TABLET | Freq: Every day | ORAL | 1 refills | Status: DC

## 2021-05-16 MED ORDER — VENLAFAXINE HCL 37.5 MG PO TABS: 37.5000 mg | ORAL_TABLET | Freq: Every day | ORAL | 1 refills | Status: DC

## 2021-05-16 NOTE — Progress Notes (Signed)
?Caitlyn Miller   ?[phone] [phone]   ?Clinic Follow up Note  ? ?Patient Care Team: ?Pcp, No as PCP - General ?Mansouraty, Telford Nab., MD as Consulting Physician (Gastroenterology) ?Donnie Mesa, MD as Consulting Physician (General Surgery) ?Truitt Merle, MD as Consulting Physician (Hematology) ?Kyung Rudd, MD as Consulting Physician (Radiation Oncology) ?Contogiannis, Audrea Muscat, MD as Consulting Physician (Plastic Surgery) ?Alla Feeling, NP as Nurse Practitioner (Nurse Practitioner) ?Tressa Busman, MD (Plastic Surgery) ? ?Date of Service:  05/16/2021 ? ?CHIEF COMPLAINT: f/u of right breast cancer ? ?CURRENT THERAPY:  ?Antiestrogen therapy started 11/2019, currently tamoxifen ? -tamoxifen 12/01/19-01/06/20 ? -anastrozole w/ Zoladex 01/06/20-06/2020 ? -restarted tamoxifen after chemo and radiation in 12/2020 ? ?ASSESSMENT & PLAN:  ?Caitlyn Miller is a 36 y.o. pre-menopausal female with  ? ?1. Malignant neoplasm of upper-outer quadrant of right breast, Stage IIA, p(T2N1aM0), ER+/PR+/HER2-, Grade II, Mammaprint luminal type A, low risk ?-presented with large palpable mass. Breast MRI and PET scan on 11/25/19 were otherwise negative.  ?-Mammaprint showed low risk disease. ?-She started neoadjuvant tamoxifen on 12/01/19. She tolerated poorly with nausea, and diffuse body aches. Dose reduced to 67m from 12/09/19 then switched to anastrozole on 01/06/20. She received monthly Zoladex injection 12/09/19 - 09/16/20. Anastrozole was stopped in 06/2020 to start chemo. ?-she started Verzenio on 01/12/20, dose reduced due to poor tolerance. Ultimately discontinued on 03/25/20 to proceed with breast surgery. ?-she was initially thought to have a good response, so she proceeded to lumpectomy on 04/15/21 with Dr. TGeorgette Doverbut was found to have a 3.1 cm tumor with positive margins and 2/3 positive lymph nodes. ?-She underwent mastectomy and ALND on 05/18/20 showing: residual invasive carcinoma and DCIS, a  positive margin, and another 2/12 positive nodes, indicating stage III disease.  ?-chest CT 06/17/20 and CT AP 07/02/20 were negative. ?-She had first cycle AC on 06/24/20 and tolerated very poorly with a variety of symptoms causing 2 ED visits. ?-she completed 12 weeks Taxol 6/23-10/14/22. ?-she received adjuvant RT 10/10-11/29/22 but developed severe chest pain with negative work up. She recovered well ?-post-treatment PET on 01/06/21 was NED. ?-she restarted tamoxifen in 12/2020 due to poor tolerance of anastrozole/Verzenio, held for month of 04/2021 for reconstruction. ?-aside from a fluid collection from reconstruction, she is clinically improving off chemo. Labs reviewed, overall stable since stopping chemo. ?-we discussed she is overdue for left mammogram. Orders are already in place, I advised her to call and schedule. She is agreeable. ?-f/u in 4 months ?  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  ?-her neuropathy is improving off treatment, now only needs one gabapentin at night ?  ?3. Generalized Weakness, body pain, Fatigue, hot flashes  ?-Pt notes for the past 4+ years she has had mid chest and mid back pain, and b/l arm and leg weakness and pain. This has lead to fatigue, low appetite, decreased daily function/activity. ?-[place]. They said this is related to stress, anxiety or depression. Pt feels she has an illness causing this instead. ?-most recent PET on 01/06/21 was negative ?-she is on Effexor for hot flashes ?  ?4. Anxiety, depression ?-currently on Effexor, tolerating well ?  ?5. Genetic Testing negative for pathogenetic mutations in RAD51D, and VUS in CHEK2 at c.1556G>T (p.Arg519Leu).   ?-She has discussed with our genetic consoler  ?  ?6. Social Support ?-She is from  [place] (her first language is [place]) and lives both there and in [place]  with her husband. She has 2 teenage children. The rest of her family is in [place].  ?-She notes her mother is living with her now. Her mother is able to help her, especially with her kids. ?  ?  ?PLAN:  ?-restart tamoxifen (she held for surgery) ?-I refilled effexor and tamoxifen today ?-left mammogram due now, she will call breast center to schedule  ?-lab and f/u in 4 months ? ? ?No problem-specific Assessment & Plan notes found for this encounter. ? ? ?SUMMARY OF ONCOLOGIC HISTORY: ?Oncology History Overview Note  ?Cancer Staging ?Malignant neoplasm of upper-outer quadrant of right breast in female, estrogen receptor positive ([place]) ?Staging form: Breast, AJCC 8th Edition ?- Clinical stage from 11/12/2019: Stage IIA (cT2, cN1, cM0, G2, ER+, PR+, HER2-) - Signed by Truitt Merle, MD on 11/19/2019 ?Stage prefix: Initial diagnosis ?Histologic grading system: 3 grade system ?- Pathologic stage from 04/15/2020: No Stage Recommended (ypT2, pN2a, cM0, G2, ER+, PR+, HER2-) - Signed by Truitt Merle, MD on 05/27/2020 ?Stage prefix: Post-therapy ?Histologic grading system: 3 grade system ?Residual tumor (R): R1 - Microscopic ? ?  ?Malignant neoplasm of upper-outer quadrant of right breast in female, estrogen receptor positive ([place])  ?11/05/2019 Mammogram  ? IMPRESSION: ?Large irregular palpable mass 4.7 x 1.4 x 1.4 cm within the upper-outer right breast 11 ?o'clock position, 4 cm from nipple.  ?There is a large area of associated coarse ?heterogeneous calcifications within the mass. Overall findings are ?concerning for breast carcinoma. ?  ?Two cortically thickened right axillary lymph nodes which are ?indeterminate in etiology. ?  ?11/12/2019 Cancer Staging  ? Staging form: Breast, AJCC 8th Edition ?- Clinical stage from 11/12/2019: Stage IIA (cT2, cN1, cM0, G2, ER+, PR+, HER2-) - Signed by Truitt Merle, MD on 11/19/2019 ? ?  ?11/12/2019 Initial Biopsy  ? Diagnosis ?1. Breast, right, needle core biopsy, right ?- INVASIVE MAMMARY  CARCINOMA, SEE COMMENT. ?- MAMMARY CARCINOMA IN SITU. ?2. Lymph node, needle/core biopsy, right ?- METASTATIC MAMMARY CARCINOMA. ?Microscopic Comment ?1. The carcinoma appears grade 2 and measures 16 mm in greatest linear extent. E-cadherin will be ordered. Prognostic ?makers will be ordered. Dr. Saralyn Pilar has reviewed the case. The case was called to [facility] ?on 01/13/2020. ? ? ? ?1. E-cadherin is strongly positive consistent with a ductal phenotype. ?  ?11/12/2019 Receptors her2  ? 1. PROGNOSTIC INDICATORS ?Results: ?IMMUNOHISTOCHEMICAL AND MORPHOMETRIC ANALYSIS PERFORMED MANUALLY ?The tumor cells are NEGATIVE for Her2 (0). ?Estrogen Receptor: 100%, POSITIVE, STRONG STAINING INTENSITY ?Progesterone Receptor: 100%, POSITIVE, STRONG STAINING INTENSITY ?Proliferation Marker Ki67: 5% ?  ?11/17/2019 Initial Diagnosis  ? Malignant neoplasm of upper-outer quadrant of right breast in female, estrogen receptor positive ([place]) ? ?  ?11/25/2019 Breast MRI  ? IMPRESSION: ?1. Large area of non-mass enhancement involving the UPPER OUTER ?QUADRANT of the RIGHT breast measuring approximately 4.2 x 4.1 x 2.4 ?cm. The biopsy-proven IDC and DCIS is present at the superomedial ?aspect of this NME. ?2. No MRI evidence of malignancy involving the LEFT breast. ?3. Intact BILATERAL retropectoral implants. ?4. 2 pathologically enlarged RIGHT axillary lymph nodes. One of ?these nodes is biopsy-proven metastatic disease. No pathologic ?lymphadenopathy elsewhere ?  ?11/25/2019 PET scan  ? IMPRESSION: ?1. Two mildly enlarged hypermetabolic right axillary lymph nodes ?compatible with right axillary nodal metastases. ?2. No additional sites of hypermetabolic metastatic disease. ?3. Asymmetric indistinct upper right breast hypermetabolism, without ?discrete mass correlate on the  CT images, compatible with known ?primary right breast malignancy. ?  ?  ?11/26/2019 Genetic Testing  ? Negative genetic testing: no pathogenic  variants detected in [facility] STAT Breast Cancer Panel.  Variant of uncertain significance (VUS) detected in CHEK2 at c.1556G>T (p.Arg519Leu).  The report date is November 26, 2019.  ? ?The STAT Breast cancer panel of[facility]

## 2021-05-17 LAB — CANCER ANTIGEN 27.29: CA 27.29: <9 U/mL (ref 0.0–38.6)

## 2021-05-18 LAB — METHYLMALONIC ACID, SERUM: Methylmalonic Acid, Quantitative: 98 nmol/L (ref 0–378)

## 2021-06-01 ENCOUNTER — Telehealth: Payer: Self-pay

## 2021-06-01 NOTE — Progress Notes (Signed)
Nurse received a call from [facility] requesting last office visit notes be faxed to [phone].  Completed.  No further concerns at  this time.  ?

## 2021-06-07 NOTE — Telephone Encounter (Signed)
Chart Review only no changes made at this time.

## 2021-06-09 ENCOUNTER — Ambulatory Visit
Admission: RE | Admit: 2021-06-09 | Discharge: 2021-06-09 | Disposition: A | Discharge status: Home / Self Care | Payer: 59 | Source: Ambulatory Visit | Attending: Nurse Practitioner | Admitting: Nurse Practitioner

## 2021-06-09 ENCOUNTER — Other Ambulatory Visit: Payer: Self-pay | Admitting: Nurse Practitioner

## 2021-06-09 DIAGNOSIS — Z1231 Encounter for screening mammogram for malignant neoplasm of breast: Secondary | ICD-10-CM

## 2021-06-09 HISTORY — DX: Personal history of antineoplastic chemotherapy: Z92.21

## 2021-06-09 HISTORY — DX: Personal history of irradiation: Z92.3

## 2021-06-09 IMAGING — MG DIGITAL SCREENING UNILAT LEFT IMPLANT  W/ TOMO W/ CAD
6 series · 6 of 14 positions shown · non-contrast
Comparison: Previous exam(s).

CLINICAL DATA: Screening. History of RIGHT mastectomy.

EXAM:
DIGITAL SCREENING UNILATERAL LEFT MAMMOGRAM WITH IMPLANTS, CAD AND
TOMOSYNTHESIS
TECHNIQUE: Left screening digital craniocaudal and mediolateral oblique
mammograms were obtained. Left screening digital breast
tomosynthesis was performed. The images were evaluated with
computer-aided detection. Standard and/or implant displaced views
were performed.

[L CC]
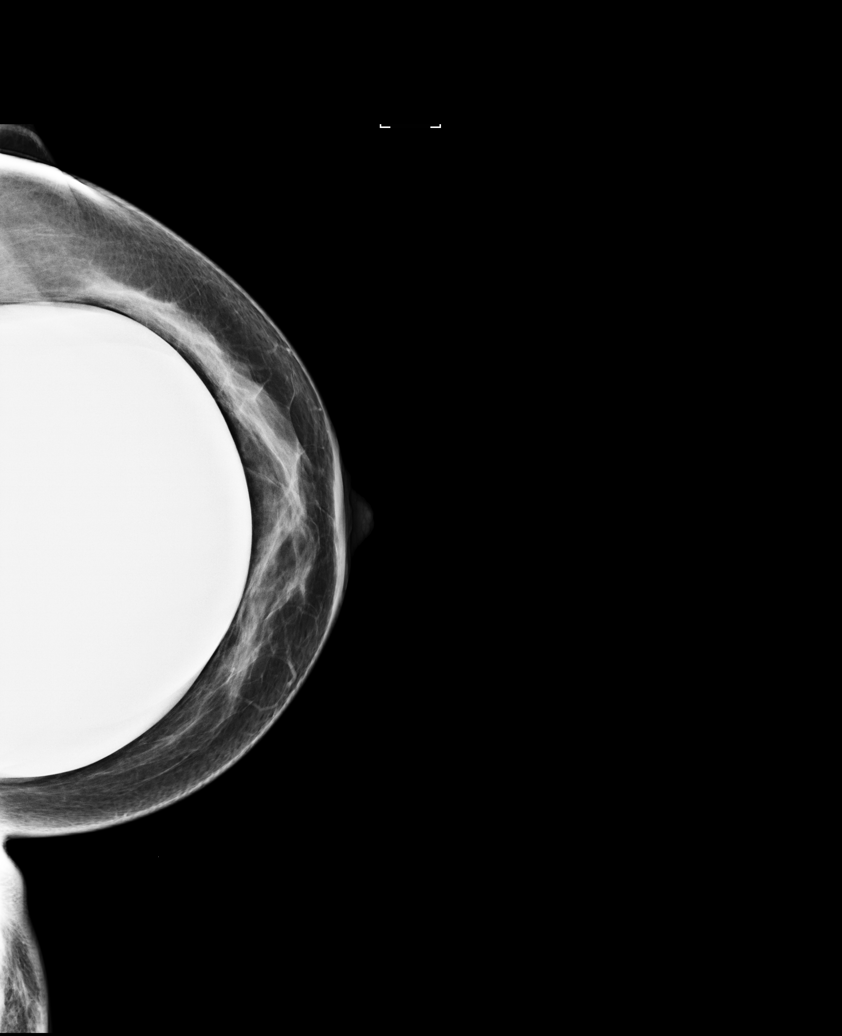

[L MLO]
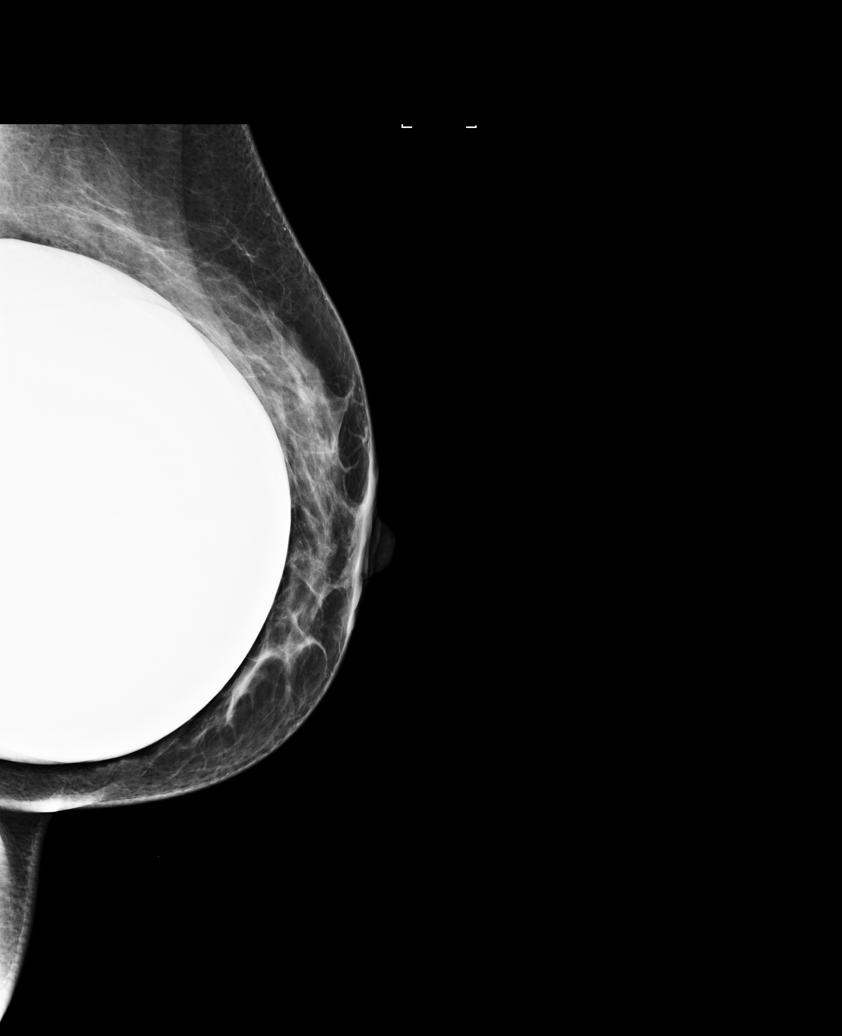

[L MLO synth-2D]
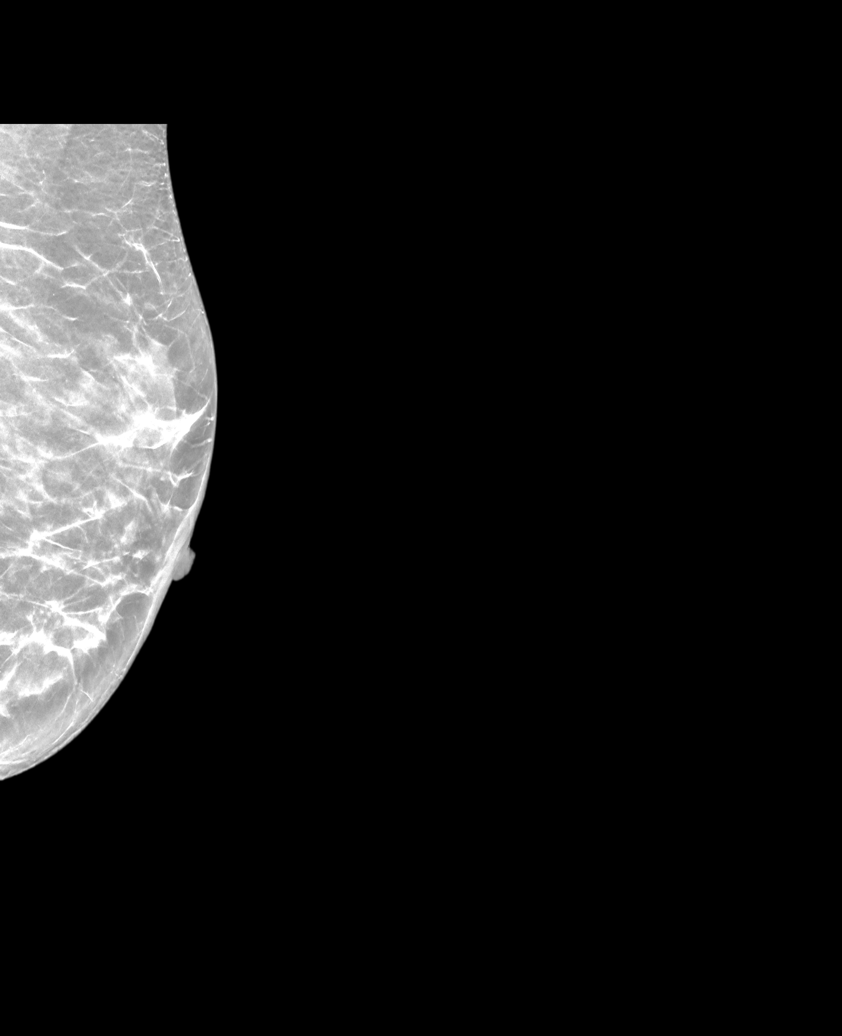

[L CC synth-2D]
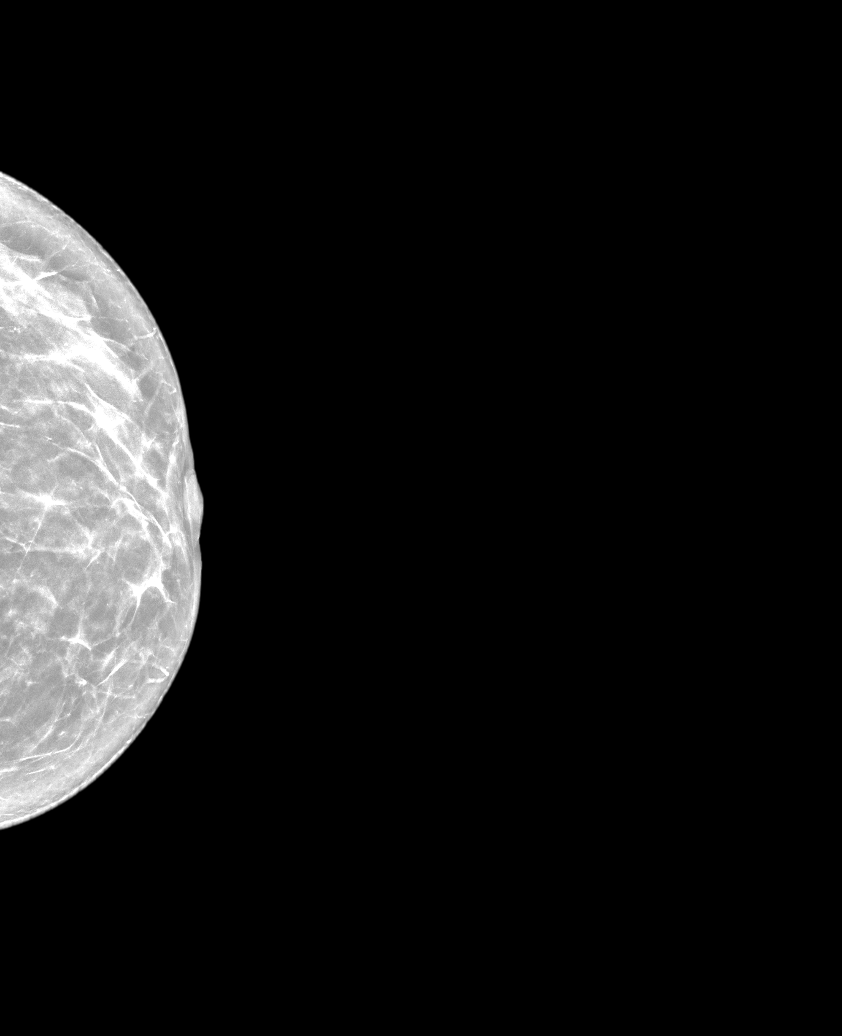

[L CCID BREAST TOMOSYNTHESIS IMAGE tomo · tomo slice 25/49.0]
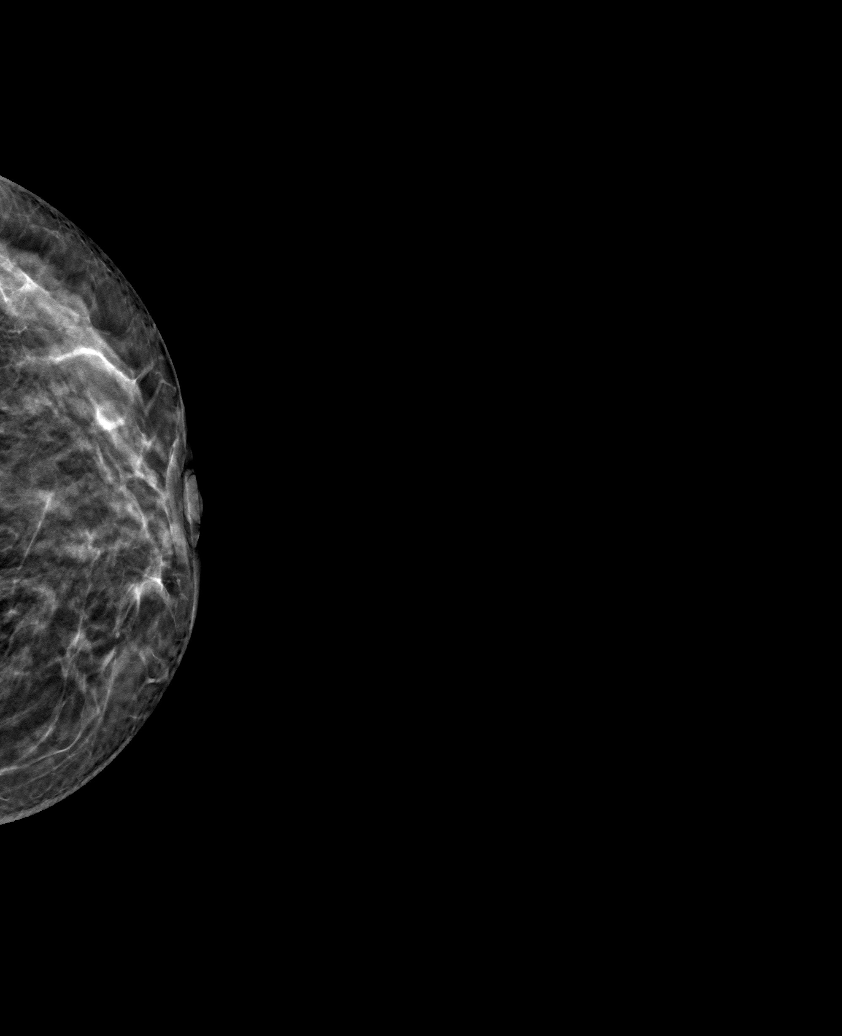

[L MLOID BREAST TOMOSYNTHESIS IMAGE tomo · tomo slice 30/59.0]
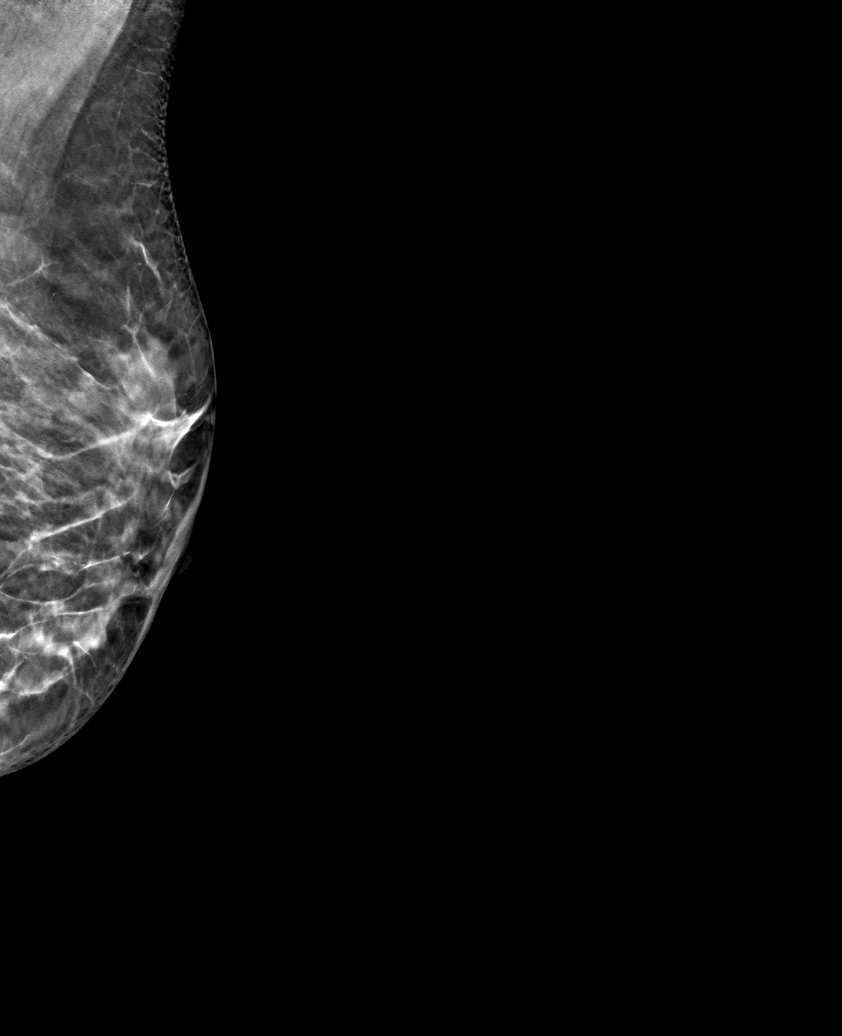

[6 of 14 positions shown; findings below may reference images not displayed]

ACR Breast Density Category c: The breast tissue is heterogeneously
dense, which may obscure small masses.
FINDINGS: The patient has retropectoral implant. There are no findings
suspicious for malignancy.
IMPRESSION: No mammographic evidence of malignancy. A result letter of this
screening mammogram will be mailed directly to the patient.

RECOMMENDATION:
Screening mammogram in one year.(Code:[LH])

BI-RADS CATEGORY  1:  Negative.

## 2021-07-21 ENCOUNTER — Encounter (HOSPITAL_COMMUNITY): Payer: Self-pay

## 2021-07-26 ENCOUNTER — Encounter (HOSPITAL_COMMUNITY): Payer: Self-pay

## 2021-09-16 ENCOUNTER — Inpatient Hospital Stay: Payer: 59

## 2021-09-16 ENCOUNTER — Inpatient Hospital Stay: Payer: 59 | Admitting: Hematology

## 2021-10-10 ENCOUNTER — Inpatient Hospital Stay (HOSPITAL_BASED_OUTPATIENT_CLINIC_OR_DEPARTMENT_OTHER): Payer: 59 | Admitting: Hematology

## 2021-10-10 ENCOUNTER — Inpatient Hospital Stay: Payer: 59 | Attending: Hematology

## 2021-10-10 ENCOUNTER — Encounter: Payer: Self-pay | Admitting: Hematology

## 2021-10-10 ENCOUNTER — Other Ambulatory Visit: Payer: Self-pay

## 2021-10-10 VITALS — BP 119/55 | HR 85 | Temp 98.7°F | Resp 18 | Ht 64.0 in | Wt 144.0 lb

## 2021-10-10 DIAGNOSIS — F32A Depression, unspecified: Secondary | ICD-10-CM | POA: Insufficient documentation

## 2021-10-10 DIAGNOSIS — Z170 Estrogen receptor positive status [ER+]: Secondary | ICD-10-CM | POA: Insufficient documentation

## 2021-10-10 DIAGNOSIS — C50411 Malignant neoplasm of upper-outer quadrant of right female breast: Secondary | ICD-10-CM | POA: Diagnosis present

## 2021-10-10 DIAGNOSIS — N951 Menopausal and female climacteric states: Secondary | ICD-10-CM | POA: Insufficient documentation

## 2021-10-10 DIAGNOSIS — Z79810 Long term (current) use of selective estrogen receptor modulators (SERMs): Secondary | ICD-10-CM | POA: Diagnosis not present

## 2021-10-10 LAB — COMPREHENSIVE METABOLIC PANEL
ALT: 37 U/L (ref 0–44)
AST: 26 U/L (ref 15–41)
Albumin: 4 g/dL (ref 3.5–5.0)
Alkaline Phosphatase: 67 U/L (ref 38–126)
Anion gap: 4 — ABNORMAL LOW (ref 5–15)
BUN: 11 mg/dL (ref 6–20)
CO2: 28 mmol/L (ref 22–32)
Calcium: 9.2 mg/dL (ref 8.9–10.3)
Chloride: 106 mmol/L (ref 98–111)
Creatinine, Ser: 0.66 mg/dL (ref 0.44–1.00)
GFR, Estimated: >60 mL/min (ref >60)
Glucose, Bld: 121 mg/dL — ABNORMAL HIGH (ref 70–99)
Potassium: 4 mmol/L (ref 3.5–5.1)
Sodium: 138 mmol/L (ref 135–145)
Total Bilirubin: 0.4 mg/dL (ref 0.3–1.2)
Total Protein: 7.4 g/dL (ref 6.5–8.1)

## 2021-10-10 LAB — CBC WITH DIFFERENTIAL/PLATELET
Abs Immature Granulocytes: 0.02 10*3/uL (ref 0.00–0.07)
Basophils Absolute: 0.1 10*3/uL (ref 0.0–0.1)
Basophils Relative: 1 %
Eosinophils Absolute: 0.2 10*3/uL (ref 0.0–0.5)
Eosinophils Relative: 2 %
HCT: 33.5 % — ABNORMAL LOW (ref 36.0–46.0)
Hemoglobin: 11.2 g/dL — ABNORMAL LOW (ref 12.0–15.0)
Immature Granulocytes: 0 %
Lymphocytes Relative: 30 %
Lymphs Abs: 2.6 10*3/uL (ref 0.7–4.0)
MCH: 29.9 pg (ref 26.0–34.0)
MCHC: 33.4 g/dL (ref 30.0–36.0)
MCV: 89.6 fL (ref 80.0–100.0)
Monocytes Absolute: 0.6 10*3/uL (ref 0.1–1.0)
Monocytes Relative: 7 %
Neutro Abs: 5.3 10*3/uL (ref 1.7–7.7)
Neutrophils Relative %: 60 %
Platelets: 328 10*3/uL (ref 150–400)
RBC: 3.74 MIL/uL — ABNORMAL LOW (ref 3.87–5.11)
RDW: 13.7 % (ref 11.5–15.5)
WBC: 8.7 10*3/uL (ref 4.0–10.5)
nRBC: 0 % (ref 0.0–0.2)

## 2021-10-10 LAB — VITAMIN B12: Vitamin B-12: 295 pg/mL (ref 180–914)

## 2021-10-10 MED ORDER — VENLAFAXINE HCL 37.5 MG PO TABS: 37.5000 mg | ORAL_TABLET | Freq: Every day | ORAL | 1 refills | Status: DC

## 2021-10-10 MED ORDER — TAMOXIFEN CITRATE 20 MG PO TABS: 20.0000 mg | ORAL_TABLET | Freq: Every day | ORAL | 1 refills | Status: DC

## 2021-10-10 NOTE — Progress Notes (Signed)
[place]   Telephone:([phone]) [phone] Fax:(336) [phone]   Clinic Follow up Note   Patient Care Team: Pcp, No as PCP - General Mansouraty, Telford Nab., MD as Consulting Physician (Gastroenterology) Donnie Mesa, MD as Consulting Physician (General Surgery) Truitt Merle, MD as Consulting Physician (Hematology) Kyung Rudd, MD as Consulting Physician (Radiation Oncology) Contogiannis, Audrea Muscat, MD as Consulting Physician (Plastic Surgery) Alla Feeling, NP as Nurse Practitioner (Nurse Practitioner) Tressa Busman, MD (Plastic Surgery)  Date of Service:  10/10/2021  CHIEF COMPLAINT: f/u of right breast cancer  CURRENT THERAPY:  Antiestrogen therapy started 11/2019, currently tamoxifen  ASSESSMENT & PLAN:  Caitlyn Miller is a 36 y.o. pre-menopausal female with   1. Malignant neoplasm of upper-outer quadrant of right breast, Stage IIA, p(T2N1aM0), ER+/PR+/HER2-, Grade II, Mammaprint luminal type A, low risk -presented with large palpable mass. Breast MRI and PET scan on 11/25/19 were otherwise negative.  -Mammaprint showed low risk disease. -She started neoadjuvant tamoxifen on 12/01/19, tolerated poorly. Dose was reduced to 35m and she started Zoladex from 12/09/19, and switched to anastrozole 01/06/20 - 06/2020 (held for chemo). Zoladex discontinued 09/16/20.  -she took Verzenio on 01/12/20 - 03/25/20 but tolerated poorly. -initially underwent lumpectomy on 04/15/21 with Dr. TGeorgette Doverbut was found to have a 3.1 cm tumor with positive margins and 2/3 positive lymph nodes. -s/p mastectomy on 05/18/20 showing: residual invasive carcinoma and DCIS, a positive margin, and another 2/12 positive nodes, indicating stage III disease.  -chest CT 06/17/20 and CT AP 07/02/20 were negative. -received one cycle AC on 06/24/20, discontinued due to very poor tolerance. -she completed 12 weeks Taxol 07/08/20 - 10/29/20. -s/p adjuvant RT 10/25/20 - 12/14/20 but developed severe chest  pain. -post-treatment PET on 01/06/21 was NED. -she restarted tamoxifen 282min 12/2020 due to poor tolerance of anastrozole/Verzenio, held for month of 04/2021 for reconstruction. She is tolerating well with controlled hot flashes on effexor. -most recent left mammogram 06/09/21 was negative. -she is clinically doing well aside from some continued struggles with fatigue. She still has her expander in place and is anticipating the final surgery; she knows to hold tamoxifen around the procedure. Lab reviewed, hgb remains low but is stable at 11.2. -f/u in 4 months   2. Anxiety, depression, hot flashes -currently on Effexor, tolerating well -mood stable and hot flashes well controlled on Effexor     PLAN:  -continue tamoxifen -I refilled effexor and tamoxifen today -lab and f/u in 4 months. May order PET or CT on her next visit -She plans to have implant placement in about 2 months, she will hold tamoxifen temporarily around her surgery.   No problem-specific Assessment & Plan notes found for this encounter.   SUMMARY OF ONCOLOGIC HISTORY: Oncology History Overview Note  Cancer Staging Malignant neoplasm of upper-outer quadrant of right breast in female, estrogen receptor positive ([place]aging form: Breast, AJCC 8th Edition - Clinical stage from 11/12/2019: Stage IIA (cT2, cN1, cM0, G2, ER+, PR+, HER2-) - Signed by FeTruitt MerleMD on 11/19/2019 Stage prefix: Initial diagnosis Histologic grading system: 3 grade system - Pathologic stage from 04/15/2020: No Stage Recommended (ypT2, pN2a, cM0, G2, ER+, PR+, HER2-) - Signed by FeTruitt MerleMD on 05/27/2020 Stage prefix: Post-therapy Histologic grading system: 3 grade system Residual tumor (R): R1 - Microscopic    Malignant neoplasm of upper-outer quadrant of right breast in female, estrogen receptor positive ([place] 11/05/2019 Mammogram   IMPRESSION: Large irregular palpable mass 4.7 x 1.4 x 1.4 cm  within the upper-outer right breast  11 o'clock position, 4 cm from nipple.  There is a large area of associated coarse heterogeneous calcifications within the mass. Overall findings are concerning for breast carcinoma.   Two cortically thickened right axillary lymph nodes which are indeterminate in etiology.   11/12/2019 Cancer Staging   Staging form: Breast, AJCC 8th Edition - Clinical stage from 11/12/2019: Stage IIA (cT2, cN1, cM0, G2, ER+, PR+, HER2-) - Signed by Truitt Merle, MD on 11/19/2019   11/12/2019 Initial Biopsy   Diagnosis 1. Breast, right, needle core biopsy, right - INVASIVE MAMMARY CARCINOMA, SEE COMMENT. - MAMMARY CARCINOMA IN SITU. 2. Lymph node, needle/core biopsy, right - METASTATIC MAMMARY CARCINOMA. Microscopic Comment 1. The carcinoma appears grade 2 and measures 16 mm in greatest linear extent. E-cadherin will be ordered. Prognostic makers will be ordered. Dr. Saralyn Pilar has reviewed the case. The case was called to [facility] on 01/13/2020.    1. E-cadherin is strongly positive consistent with a ductal phenotype.   11/12/2019 Receptors her2   1. PROGNOSTIC INDICATORS Results: IMMUNOHISTOCHEMICAL AND MORPHOMETRIC ANALYSIS PERFORMED MANUALLY The tumor cells are NEGATIVE for Her2 (0). Estrogen Receptor: 100%, POSITIVE, STRONG STAINING INTENSITY Progesterone Receptor: 100%, POSITIVE, STRONG STAINING INTENSITY Proliferation Marker Ki67: 5%   11/17/2019 Initial Diagnosis   Malignant neoplasm of upper-outer quadrant of right breast in female, estrogen receptor positive ([place])   11/25/2019 Breast MRI   IMPRESSION: 1. Large area of non-mass enhancement involving the UPPER OUTER QUADRANT of the RIGHT breast measuring approximately 4.2 x 4.1 x 2.4 cm. The biopsy-proven IDC and DCIS is present at the superomedial aspect of this NME. 2. No MRI evidence of malignancy involving the LEFT breast. 3. Intact BILATERAL retropectoral implants. 4. 2 pathologically enlarged RIGHT axillary  lymph nodes. One of these nodes is biopsy-proven metastatic disease. No pathologic lymphadenopathy elsewhere   11/25/2019 PET scan   IMPRESSION: 1. Two mildly enlarged hypermetabolic right axillary lymph nodes compatible with right axillary nodal metastases. 2. No additional sites of hypermetabolic metastatic disease. 3. Asymmetric indistinct upper right breast hypermetabolism, without discrete mass correlate on the CT images, compatible with known primary right breast malignancy.     11/26/2019 Genetic Testing   Negative genetic testing: no pathogenic variants detected in [facility] STAT Breast Cancer Panel.  Variant of uncertain significance (VUS) detected in CHEK2 at c.1556G>T (p.Arg519Leu).  The report date is November 26, 2019.   The STAT Breast cancer panel offered by [facility] includes sequencing and rearrangement analysis for the following 9 genes:  ATM, BRCA1, BRCA2, CDH1, CHEK2, PALB2, PTEN, STK11 and TP53.    Results of [facility] Multi-Cancer Panel are pending.    12/01/2019 -  Anti-estrogen oral therapy   Neoadjuvant Tamoxifen 20mg  on 12/01/19. Reduced to 10mg  on 12/09/19.       ---Zoladex injection monthly starting 12/09/19.       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  Variants of uncertain significance detected RAD51D at c.715C>T (p.Arg239Trp) and CHEK2 at c.1556G>T (p.Arg519Leu).  The report date is December 08, 2019.   The Multi-Cancer Panel offered by [facility] includes sequencing and/or deletion duplication testing of the following 85 genes: AIP, ALK, APC, ATM, AXIN2,BAP1,  BARD1, BLM, BMPR1A, BRCA1, BRCA2, BRIP1, CASR, CDC73, CDH1, CDK4, CDKN1B, CDKN1C, CDKN2A (p14ARF), CDKN2A (p16INK4a), CEBPA, CHEK2, CTNNA1, DICER1, DIS3L2, EGFR (c.2369C>T, p.Thr790Met variant only), EPCAM (Deletion/duplication testing only), FH,  FLCN, GATA2, GPC3, GREM1 (Promoter region deletion/duplication testing only), HOXB13 (c.251G>A, p.Gly84Glu), HRAS, KIT, MAX, MEN1, MET, MITF (c.952G>A, p.Glu318Lys variant only), MLH1, MSH2, MSH3, MSH6, MUTYH, NBN, NF1, NF2, NTHL1, PALB2, PDGFRA, PHOX2B, PMS2, POLD1, POLE, POT1, PRKAR1A, PTCH1, PTEN, RAD50, RAD51C, RAD51D, RB1, RECQL4, RET, RNF43, RUNX1, SDHAF2, SDHA (sequence changes only), SDHB, SDHC, SDHD, SMAD4, SMARCA4, SMARCB1, SMARCE1, STK11, SUFU, TERC, TERT, TMEM127, TP53, TSC1, TSC2, VHL, WRN and WT1.    12/09/2019 Miscellaneous   Mammaprint luminal type A, low risk  10% risk of recurrence in 10 years with 97.8% benefor of hormaonal therapy alone.    01/12/2020 - 03/25/2020 Chemotherapy   Verzenio started on 01/12/2020, dose reduced to $RemoveBe'50mg'McMZZAhfE$  am and $Remo'100mg'Shviq$  01/27/2020 due to poor tolerance. Held Verzenio on 03/25/20 to proceed with breast surgery.    03/01/2020 Mammogram   Targeted ultrasound is performed, showing interval decreasing conspicuity of the patient's known right breast cancer. A post biopsy clip with an associated irregular area shadowing is demonstrated at the 11 o'clock position 4 cm from the nipple. Exact measurements are difficult due to the vague appearance of the findings. It measures approximately 1.2 x 0.9 x 0.6 cm (previously 4.7 x 2.2 x 1.5 cm).   IMPRESSION: Imaging findings consistent with good response to chemotherapy.   03/16/2020 Imaging   MRI Breast  IMPRESSION: 1. Interval resolution of RIGHT axillary adenopathy. 2. Slightly smaller area of non mass enhancement in the UPPER-OUTER QUADRANT of the RIGHT breast.   04/15/2020 Surgery   RIGHT BREAST LUMPECTOMY WITH RADIOACTIVE SEED LOCALIZATION and RADIOACTIVE SEED GUIDED RIGHT AXILLARY SENTINEL LYMPH NODE DISSECTION and SENTINEL NODE BIOPSY by Dr Georgette Dover    04/15/2020 Pathology Results   FINAL MICROSCOPIC DIAGNOSIS:   A. 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  - Invasive carcinoma broadly involves each the anterior, posterior,  superior and inferior margins.  - DCIS involves the inferior margin and is < 1 mm from each the anterior  and superior margins.  - Atypical lobular hyperplasia.  - Biopsy sites.  - See oncology table.   B. TARGETED LYMPH NODE, RIGHT AXILLA, BIOPSY:  - Metastatic carcinoma in (1) of (1) lymph node.  - Biopsy site.   C. SENTINEL LYMPH NODE, RIGHT AXILLA #1, BIOPSY:  - Metastatic carcinoma in (1) of (1) lymph node.   D. SENTINEL LYMPH NODE, RIGHT AXILLA #2, BIOPSY:  - One lymph node, negative for carcinoma (0/1).    04/15/2020 Cancer Staging   Staging form: Breast, AJCC 8th Edition - Pathologic stage from 04/15/2020: No Stage Recommended (ypT2, pN2a, cM0, G2, ER+, PR+, HER2-) - Signed by Truitt Merle, MD on 05/27/2020 Stage prefix: Post-therapy Histologic grading system: 3 grade system Residual tumor (R): R1 - Microscopic   05/18/2020 Surgery   Right Mastectomy  A. BREAST, RIGHT, MASTECTOMY:  - Residual invasive carcinoma with mixed ductal and lobular features.       Residual invasive carcinoma broadly involves the anterior inferior  soft tissue margin.       Residual invasive carcinoma is focally < 1 mm from the deep margin.  - Residual ductal carcinoma in situ.       Residual DCIS is 3 mm from the closest margin, deep.  - Residual atypical lobular hyperplasia.  - Prior procedure site changes.  - Biopsy clip (X2).   B. BREAST IMPLANT,  RIGHT, EXPLANTATION:  - Implant (gross only).   C. LYMPH NODES, RIGHT AXILLA, DISSECTION:  - Metastatic carcinoma in (2) of (12) lymph nodes.  - Prior procedure site changes in surrounding soft tissue.    06/24/2020 - 09/30/2020 Chemotherapy   Patient is on Treatment Plan : BREAST ADJUVANT DOSE DENSE AC q14d / PACLitaxel q7d        INTERVAL  HISTORY:  Caitlyn Miller is here for a follow up of breast cancer. She was last seen by me on 05/16/21. She presents to the clinic alone. She reports she is recovering well. She notes her period returned last month. She reports she is still dealing with fatigue.  She tells me she recently noticed her breathing at night will stop. She explains she feels this started after her last breast surgery.   All other systems were reviewed with the patient and are negative.  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   Pelvic pain 09/2008   Personal history of chemotherapy    Personal history of radiation therapy     SURGICAL HISTORY: Past Surgical History:  Procedure Laterality Date   AUGMENTATION MAMMAPLASTY Bilateral 8119   silicone    BREAST IMPLANT REMOVAL Right 05/18/2020   Procedure: REMOVAL OF RIGHT BREAST IMPLANT;  Surgeon: Donnie Mesa, MD;  Location: [place];  Service: General;  Laterality: Right;   BREAST LUMPECTOMY WITH RADIOACTIVE SEED LOCALIZATION Right 04/15/2020   Procedure: RIGHT BREAST LUMPECTOMY WITH RADIOACTIVE SEED LOCALIZATION;  Surgeon: Donnie Mesa, MD;  Location: [place];  Service: General;  Laterality: Right;   MASTECTOMY     MODIFIED MASTECTOMY Right 05/18/2020   Procedure: RIGHT MODIFIED RADICAL MASTECTOMY;  Surgeon: Donnie Mesa, MD;  Location: [place];  Service: General;  Laterality:  Right;   PORTACATH PLACEMENT Left 06/10/2020   Procedure: INSERTION PORT-A-CATH;  Surgeon: Donnie Mesa, MD;  Location: [place];  Service: General;  Laterality: Left;  45 MINUTES ROOM 2   RADIOACTIVE SEED GUIDED AXILLARY SENTINEL LYMPH NODE Right 04/15/2020   Procedure: RADIOACTIVE SEED GUIDED RIGHT AXILLARY SENTINEL LYMPH NODE DISSECTION;  Surgeon: Donnie Mesa, MD;  Location: [place];  Service: General;  Laterality: Right;   SENTINEL NODE BIOPSY N/A 04/15/2020   Procedure: SENTINEL NODE BIOPSY;  Surgeon: Donnie Mesa, MD;  Location: [place];  Service: General;  Laterality: N/A;   WISDOM TOOTH EXTRACTION      I have reviewed the social history and family history with the patient and they are unchanged from previous note.  ALLERGIES:  is allergic to ibuprofen.  MEDICATIONS:  Current Outpatient Medications  Medication Sig Dispense Refill   acetaminophen (TYLENOL) 500 MG tablet Take 500 mg by mouth every 6 (six) hours as needed for moderate pain.  clindamycin (CLINDAGEL) 1 % gel Apply topically 2 (two) times daily. 30 g 0   gabapentin (NEURONTIN) 100 MG capsule TAKE 2 CAPSULES(200 MG) BY MOUTH AT BEDTIME 60 capsule 1   hydrocortisone 1 % lotion Apply 1 application topically daily as needed (rash). 118 mL 0   ondansetron (ZOFRAN ODT) 8 MG disintegrating tablet Take 1 tablet (8 mg total) by mouth every 8 (eight) hours as needed for nausea or vomiting. 30 tablet 0   pantoprazole (PROTONIX) 40 MG tablet TAKE 1 TABLET(40 MG) BY MOUTH DAILY 30 tablet 3   prochlorperazine (COMPAZINE) 10 MG tablet TAKE 1 TABLET(10 MG) BY MOUTH EVERY 8 HOURS AS NEEDED FOR NAUSEA OR VOMITING 30 tablet 1   tamoxifen (NOLVADEX) 20 MG tablet Take 1 tablet (20 mg total) by mouth daily. 90 tablet 1   venlafaxine (EFFEXOR) 37.5 MG tablet Take 1 tablet (37.5 mg total) by mouth daily. 90 tablet 1   No current facility-administered medications for this visit.     PHYSICAL EXAMINATION: ECOG PERFORMANCE STATUS: 0 - Asymptomatic  Vitals:   10/10/21 1305  BP: (!) 119/55  Pulse: 85  Resp: 18  Temp: 98.7 F (37.1 C)  SpO2: 100%   Wt Readings from Last 3 Encounters:  10/10/21 144 lb (65.3 kg)  05/16/21 138 lb 11.2 oz (62.9 kg)  04/07/21 136 lb 1.6 oz (61.7 kg)     GENERAL:alert, no distress and comfortable SKIN: skin color, texture, turgor are normal, no rashes or significant lesions EYES: normal, Conjunctiva are pink and non-injected, sclera clear  NECK: supple, thyroid normal size, non-tender, without nodularity LYMPH:  no palpable lymphadenopathy in the cervical, axillary LUNGS: clear to auscultation and percussion with normal breathing effort HEART: regular rate & rhythm and no murmurs and no lower extremity edema ABDOMEN:abdomen soft, non-tender and normal bowel sounds Musculoskeletal:no cyanosis of digits and no clubbing  NEURO: alert & oriented x 3 with fluent speech, no focal motor/sensory deficits BREAST: No palpable mass, nodules or adenopathy bilaterally. Breast exam benign.   LABORATORY DATA:  I have reviewed the data as listed    Latest Ref Rng & Units 10/10/2021   12:39 PM 05/16/2021    8:41 AM 04/07/2021   11:58 AM  CBC  WBC 4.0 - 10.5 K/uL 8.7  7.5  4.6   Hemoglobin 12.0 - 15.0 g/dL 11.2  11.3  11.6   Hematocrit 36.0 - 46.0 % 33.5  33.2  34.7   Platelets 150 - 400 K/uL 328  353  228         Latest Ref Rng & Units 10/10/2021   12:39 PM 05/16/2021    8:41 AM 04/07/2021   11:58 AM  CMP  Glucose 70 - 99 mg/dL 121  111  93   BUN 6 - 20 mg/dL _0 Creatinine 0.44 - 1.00 mg/dL 0.66  0.57  0.63   Sodium 135 - 145 mmol/L 138  141  139   Potassium 3.5 - 5.1 mmol/L 4.0  3.6  4.0   Chloride 98 - 111 mmol/L 106  107  105   CO2 22 - 32 mmol/L _1 Calcium 8.9 - 10.3 mg/dL 9.2  9.1  9.5   Total Protein 6.5 - 8.1 g/dL 7.4  6.7  6.9   Total Bilirubin 0.3 - 1.2 mg/dL 0.4  0.3  0.4   Alkaline Phos 38 - 126 U/L  67  58  60   AST 15 -  41 U/L 26  35  23   ALT 0 - 44 U/L 37  54  38       RADIOGRAPHIC STUDIES: I have personally reviewed the radiological images as listed and agreed with the findings in the report. No results found.    No orders of the defined types were placed in this encounter.  All questions were answered. The patient knows to call the clinic with any problems, questions or concerns. No barriers to learning was detected. The total time spent in the appointment was 25 minutes.     Truitt Merle, MD 10/10/2021   I, Wilburn Mylar, am acting as scribe for Truitt Merle, MD.   I have reviewed the above documentation for accuracy and completeness, and I agree with the above.

## 2021-10-11 LAB — CANCER ANTIGEN 27.29: CA 27.29: 10.7 U/mL (ref 0.0–38.6)

## 2021-10-28 ENCOUNTER — Telehealth: Payer: Self-pay | Admitting: Hematology

## 2021-10-28 NOTE — Telephone Encounter (Signed)
Scheduled follow-up appointment per 9/25 los. Patient is aware. 

## 2022-02-08 NOTE — Progress Notes (Deleted)
[place]   Telephone:([phone]) [phone] Fax:(336) [phone]   Clinic Follow up Note   Patient Care Team: Pcp, No as PCP - General Mansouraty, Telford Nab., MD as Consulting Physician (Gastroenterology) Donnie Mesa, MD as Consulting Physician (General Surgery) Truitt Merle, MD as Consulting Physician (Hematology) Kyung Rudd, MD as Consulting Physician (Radiation Oncology) Contogiannis, Audrea Muscat, MD as Consulting Physician (Plastic Surgery) Alla Feeling, NP as Nurse Practitioner (Nurse Practitioner) Tressa Busman, MD (Plastic Surgery)  Date of Service:  02/08/2022  CHIEF COMPLAINT: f/u of  right breast cancer    CURRENT THERAPY:  Antiestrogen therapy started 11/2019, currently tamoxifen     ASSESSMENT: *** Caitlyn Miller is a 37 y.o. female with   No problem-specific Assessment & Plan notes found for this encounter.  ***   PLAN: {Everything Dr. Burr Medico talks to pt about, including reviewing scans and labs. } -{proceed with ***} -{lab with/without flush and f/u when?}   SUMMARY OF ONCOLOGIC HISTORY: Oncology History Overview Note  Cancer Staging Malignant neoplasm of upper-outer quadrant of right breast in female, estrogen receptor positive ([place]) Staging form: Breast, AJCC 8th Edition - Clinical stage from 11/12/2019: Stage IIA (cT2, cN1, cM0, G2, ER+, PR+, HER2-) - Signed by Truitt Merle, MD on 11/19/2019 Stage prefix: Initial diagnosis Histologic grading system: 3 grade system - Pathologic stage from 04/15/2020: No Stage Recommended (ypT2, pN2a, cM0, G2, ER+, PR+, HER2-) - Signed by Truitt Merle, MD on 05/27/2020 Stage prefix: Post-therapy Histologic grading system: 3 grade system Residual tumor (R): R1 - Microscopic    Malignant neoplasm of upper-outer quadrant of right breast in female, estrogen receptor positive ([place])  11/05/2019 Mammogram   IMPRESSION: Large irregular palpable mass 4.7 x 1.4 x 1.4 cm within the upper-outer right breast 11 o'clock position,  4 cm from nipple.  There is a large area of associated coarse heterogeneous calcifications within the mass. Overall findings are concerning for breast carcinoma.   Two cortically thickened right axillary lymph nodes which are indeterminate in etiology.   11/12/2019 Cancer Staging   Staging form: Breast, AJCC 8th Edition - Clinical stage from 11/12/2019: Stage IIA (cT2, cN1, cM0, G2, ER+, PR+, HER2-) - Signed by Truitt Merle, MD on 11/19/2019   11/12/2019 Initial Biopsy   Diagnosis 1. Breast, right, needle core biopsy, right - INVASIVE MAMMARY CARCINOMA, SEE COMMENT. - MAMMARY CARCINOMA IN SITU. 2. Lymph node, needle/core biopsy, right - METASTATIC MAMMARY CARCINOMA. Microscopic Comment 1. The carcinoma appears grade 2 and measures 16 mm in greatest linear extent. E-cadherin will be ordered. Prognostic makers will be ordered. Dr. Saralyn Pilar has reviewed the case. The case was called to [place] on 01/13/2020.    1. E-cadherin is strongly positive consistent with a ductal phenotype.   11/12/2019 Receptors her2   1. PROGNOSTIC INDICATORS Results: IMMUNOHISTOCHEMICAL AND MORPHOMETRIC ANALYSIS PERFORMED MANUALLY The tumor cells are NEGATIVE for Her2 (0). Estrogen Receptor: 100%, POSITIVE, STRONG STAINING INTENSITY Progesterone Receptor: 100%, POSITIVE, STRONG STAINING INTENSITY Proliferation Marker Ki67: 5%   11/17/2019 Initial Diagnosis   Malignant neoplasm of upper-outer quadrant of right breast in female, estrogen receptor positive ([place])   11/25/2019 Breast MRI   IMPRESSION: 1. Large area of non-mass enhancement involving the UPPER OUTER QUADRANT of the RIGHT breast measuring approximately 4.2 x 4.1 x 2.4 cm. The biopsy-proven IDC and DCIS is present at the superomedial aspect of this NME. 2. No MRI evidence of malignancy involving the LEFT breast. 3. Intact BILATERAL retropectoral implants. 4. 2 pathologically enlarged  RIGHT axillary lymph nodes. One  of these nodes is biopsy-proven metastatic disease. No pathologic lymphadenopathy elsewhere   11/25/2019 PET scan   IMPRESSION: 1. Two mildly enlarged hypermetabolic right axillary lymph nodes compatible with right axillary nodal metastases. 2. No additional sites of hypermetabolic metastatic disease. 3. Asymmetric indistinct upper right breast hypermetabolism, without discrete mass correlate on the CT images, compatible with known primary right breast malignancy.     11/26/2019 Genetic Testing   Negative genetic testing: no pathogenic variants detected in [facility] STAT Breast Cancer Panel.  Variant of uncertain significance (VUS) detected in CHEK2 at c.1556G>T (p.Arg519Leu).  The report date is November 26, 2019.   The STAT Breast cancer panel offered by [facility] includes sequencing and rearrangement analysis for the following 9 genes:  ATM, BRCA1, BRCA2, CDH1, CHEK2, PALB2, PTEN, STK11 and TP53.    Results of [facility] Multi-Cancer Panel are pending.    12/01/2019 -  Anti-estrogen oral therapy   Neoadjuvant Tamoxifen '20mg'$  on 12/01/19. Reduced to '10mg'$  on 12/09/19.       ---Zoladex injection monthly starting 12/09/19.       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  Variants of uncertain significance detected RAD51D at c.715C>T (p.Arg239Trp) and CHEK2 at c.1556G>T (p.Arg519Leu).  The report date is December 08, 2019.   The Multi-Cancer Panel offered by [facility] includes sequencing and/or deletion duplication testing of the following 85 genes: AIP, ALK, APC, ATM, AXIN2,BAP1,  BARD1, BLM, BMPR1A, BRCA1, BRCA2, BRIP1, CASR, CDC73, CDH1, CDK4, CDKN1B, CDKN1C, CDKN2A (p14ARF), CDKN2A (p16INK4a), CEBPA, CHEK2, CTNNA1, DICER1, DIS3L2, EGFR (c.2369C>T, p.Thr790Met variant only), EPCAM (Deletion/duplication testing only), FH, FLCN, GATA2, GPC3,  GREM1 (Promoter region deletion/duplication testing only), HOXB13 (c.251G>A, p.Gly84Glu), HRAS, KIT, MAX, MEN1, MET, MITF (c.952G>A, p.Glu318Lys variant only), MLH1, MSH2, MSH3, MSH6, MUTYH, NBN, NF1, NF2, NTHL1, PALB2, PDGFRA, PHOX2B, PMS2, POLD1, POLE, POT1, PRKAR1A, PTCH1, PTEN, RAD50, RAD51C, RAD51D, RB1, RECQL4, RET, RNF43, RUNX1, SDHAF2, SDHA (sequence changes only), SDHB, SDHC, SDHD, SMAD4, SMARCA4, SMARCB1, SMARCE1, STK11, SUFU, TERC, TERT, TMEM127, TP53, TSC1, TSC2, VHL, WRN and WT1.    12/09/2019 Miscellaneous   Mammaprint luminal type A, low risk  10% risk of recurrence in 10 years with 97.8% benefor of hormaonal therapy alone.    01/12/2020 - 03/25/2020 Chemotherapy   Verzenio started on 01/12/2020, dose reduced to '50mg'$  am and '100mg'$  01/27/2020 due to poor tolerance. Held Verzenio on 03/25/20 to proceed with breast surgery.    03/01/2020 Mammogram   Targeted ultrasound is performed, showing interval decreasing conspicuity of the patient's known right breast cancer. A post biopsy clip with an associated irregular area shadowing is demonstrated at the 11 o'clock position 4 cm from the nipple. Exact measurements are difficult due to the vague appearance of the findings. It measures approximately 1.2 x 0.9 x 0.6 cm (previously 4.7 x 2.2 x 1.5 cm).   IMPRESSION: Imaging findings consistent with good response to chemotherapy.   03/16/2020 Imaging   MRI Breast  IMPRESSION: 1. Interval resolution of RIGHT axillary adenopathy. 2. Slightly smaller area of non mass enhancement in the UPPER-OUTER QUADRANT of the RIGHT breast.   04/15/2020 Surgery   RIGHT BREAST LUMPECTOMY WITH RADIOACTIVE SEED LOCALIZATION and RADIOACTIVE SEED GUIDED RIGHT AXILLARY SENTINEL LYMPH NODE DISSECTION and SENTINEL NODE BIOPSY by Dr Georgette Dover    04/15/2020 Pathology Results   FINAL MICROSCOPIC DIAGNOSIS:   A. 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  - Invasive carcinoma broadly involves each the anterior, posterior,  superior and inferior margins.  - DCIS involves the inferior margin and is < 1 mm from each the anterior  and superior margins.  - Atypical lobular hyperplasia.  - Biopsy sites.  - See oncology table.   B. TARGETED LYMPH NODE, RIGHT AXILLA, BIOPSY:  - Metastatic carcinoma in (1) of (1) lymph node.  - Biopsy site.   C. SENTINEL LYMPH NODE, RIGHT AXILLA #1, BIOPSY:  - Metastatic carcinoma in (1) of (1) lymph node.   D. SENTINEL LYMPH NODE, RIGHT AXILLA #2, BIOPSY:  - One lymph node, negative for carcinoma (0/1).    04/15/2020 Cancer Staging   Staging form: Breast, AJCC 8th Edition - Pathologic stage from 04/15/2020: No Stage Recommended (ypT2, pN2a, cM0, G2, ER+, PR+, HER2-) - Signed by Truitt Merle, MD on 05/27/2020 Stage prefix: Post-therapy Histologic grading system: 3 grade system Residual tumor (R): R1 - Microscopic   05/18/2020 Surgery   Right Mastectomy  A. BREAST, RIGHT, MASTECTOMY:  - Residual invasive carcinoma with mixed ductal and lobular features.       Residual invasive carcinoma broadly involves the anterior inferior  soft tissue margin.       Residual invasive carcinoma is focally < 1 mm from the deep margin.  - Residual ductal carcinoma in situ.       Residual DCIS is 3 mm from the closest margin, deep.  - Residual atypical lobular hyperplasia.  - Prior procedure site changes.  - Biopsy clip (X2).   B. BREAST IMPLANT, RIGHT, EXPLANTATION:  - Implant (gross only).   C. LYMPH NODES, RIGHT AXILLA, DISSECTION:  - Metastatic carcinoma in (2) of (12) lymph nodes.  - Prior procedure site changes in surrounding soft tissue.    06/24/2020 - 09/30/2020 Chemotherapy   Patient is on Treatment Plan : BREAST ADJUVANT DOSE DENSE AC q14d / PACLitaxel q7d        INTERVAL HISTORY: *** Caitlyn Miller is here for a follow up of  right breast cancer   She was last seen by me on 10/10/2021 She presents to the clinic   All other systems were reviewed with the patient and are negative.  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   Pelvic pain 09/2008   Personal history of chemotherapy    Personal history of radiation therapy     SURGICAL HISTORY: Past Surgical History:  Procedure Laterality Date   AUGMENTATION MAMMAPLASTY Bilateral 1884   silicone    BREAST IMPLANT REMOVAL Right 05/18/2020   Procedure: REMOVAL OF RIGHT BREAST IMPLANT;  Surgeon: Georgette Dover,  Rodman Key, MD;  Location: [facility];  Service: General;  Laterality: Right;   BREAST LUMPECTOMY WITH RADIOACTIVE SEED LOCALIZATION Right 04/15/2020   Procedure: RIGHT BREAST LUMPECTOMY WITH RADIOACTIVE SEED LOCALIZATION;  Surgeon: Donnie Mesa, MD;  Location: [place];  Service: General;  Laterality: Right;   MASTECTOMY     MODIFIED MASTECTOMY Right 05/18/2020   Procedure: RIGHT MODIFIED RADICAL MASTECTOMY;  Surgeon: Donnie Mesa, MD;  Location: [facility];  Service: General;  Laterality: Right;   PORTACATH PLACEMENT Left 06/10/2020   Procedure: INSERTION PORT-A-CATH;  Surgeon: Donnie Mesa, MD;  Location: [facility];  Service: General;  Laterality: Left;  45 MINUTES ROOM 2   RADIOACTIVE SEED [place] Right  04/15/2020   Procedure: RADIOACTIVE SEED GUIDED RIGHT AXILLARY SENTINEL LYMPH NODE DISSECTION;  Surgeon: Donnie Mesa, MD;  Location: [facility];  Service: General;  Laterality: Right;   SENTINEL NODE BIOPSY N/A 04/15/2020   Procedure: SENTINEL NODE BIOPSY;  Surgeon: Donnie Mesa, MD;  Location: [facility];  Service: General;  Laterality: N/A;   WISDOM TOOTH EXTRACTION      I have reviewed the social history and family history with the patient and they are unchanged from previous note.  ALLERGIES:  is allergic to ibuprofen.  MEDICATIONS:  Current Outpatient Medications  Medication Sig Dispense Refill   acetaminophen (TYLENOL) 500 MG tablet Take 500 mg by mouth every 6 (six) hours as needed for moderate pain.     clindamycin (CLINDAGEL) 1 % gel Apply topically 2 (two) times daily. 30 g 0   gabapentin (NEURONTIN) 100 MG capsule TAKE 2 CAPSULES(200 MG) BY MOUTH AT BEDTIME 60 capsule 1   hydrocortisone 1 % lotion Apply 1 application topically daily as needed (rash). 118 mL 0   ondansetron (ZOFRAN ODT) 8 MG disintegrating tablet Take 1 tablet (8 mg total) by mouth every 8 (eight) hours as needed for nausea or vomiting. 30 tablet 0   pantoprazole (PROTONIX) 40 MG tablet TAKE 1 TABLET(40 MG) BY MOUTH DAILY 30 tablet 3   prochlorperazine (COMPAZINE) 10 MG tablet TAKE 1 TABLET(10 MG) BY MOUTH EVERY 8 HOURS AS NEEDED FOR NAUSEA OR VOMITING 30 tablet 1   tamoxifen (NOLVADEX) 20 MG tablet Take 1 tablet (20 mg total) by mouth daily. 90 tablet 1   venlafaxine (EFFEXOR) 37.5 MG tablet Take 1 tablet (37.5 mg total) by mouth daily. 90 tablet 1   No current facility-administered medications for this visit.    PHYSICAL EXAMINATION: ECOG PERFORMANCE STATUS: {CHL ONC ECOG PS:[phone]}  There were no vitals filed for this visit. Wt Readings from Last 3 Encounters:  10/10/21 144 lb (65.3 kg)  05/16/21 138 lb 11.2 oz (62.9 kg)  04/07/21 136 lb 1.6 oz (61.7 kg)    {Only  keep what was examined. If exam not performed, can use .CEXAM } GENERAL:alert, no distress and comfortable SKIN: skin color, texture, turgor are normal, no rashes or significant lesions EYES: normal, Conjunctiva are pink and non-injected, sclera clear {OROPHARYNX:no exudate, no erythema and lips, buccal mucosa, and tongue normal}  NECK: supple, thyroid normal size, non-tender, without nodularity LYMPH:  no palpable lymphadenopathy in the cervical, axillary {or inguinal} LUNGS: clear to auscultation and percussion with normal breathing effort HEART: regular rate & rhythm and no murmurs and no lower extremity edema ABDOMEN:abdomen soft, non-tender and normal bowel sounds Musculoskeletal:no cyanosis of digits and no clubbing  NEURO: alert & oriented x 3 with fluent speech, no focal motor/sensory deficits  LABORATORY DATA:  I have reviewed the data as listed    Latest Ref Rng & Units 10/10/2021   12:39 PM 05/16/2021    8:41 AM 04/07/2021   11:58 AM  CBC  WBC 4.0 - 10.5 K/uL 8.7  7.5  4.6   Hemoglobin 12.0 - 15.0 g/dL 11.2  11.3  11.6   Hematocrit 36.0 - 46.0 % 33.5  33.2  34.7   Platelets 150 - 400 K/uL 328  353  228         Latest Ref Rng & Units 10/10/2021   12:39 PM 05/16/2021    8:41 AM 04/07/2021   11:58 AM  CMP  Glucose 70 - 99 mg/dL 121  111  93   BUN 6 - 20 mg/dL '11  11  13   '$ Creatinine 0.44 - 1.00 mg/dL 0.66  0.57  0.63   Sodium 135 - 145 mmol/L 138  141  139   Potassium 3.5 - 5.1 mmol/L 4.0  3.6  4.0   Chloride 98 - 111 mmol/L 106  107  105   CO2 22 - 32 mmol/L '28  27  26   '$ Calcium 8.9 - 10.3 mg/dL 9.2  9.1  9.5   Total Protein 6.5 - 8.1 g/dL 7.4  6.7  6.9   Total Bilirubin 0.3 - 1.2 mg/dL 0.4  0.3  0.4   Alkaline Phos 38 - 126 U/L 67  58  60   AST 15 - 41 U/L 26  35  23   ALT 0 - 44 U/L 37  54  38       RADIOGRAPHIC STUDIES: I have personally reviewed the radiological images as listed and agreed with the findings in the report. No results found.    No orders of  the defined types were placed in this encounter.  All questions were answered. The patient knows to call the clinic with any problems, questions or concerns. No barriers to learning was detected. The total time spent in the appointment was {CHL ONC TIME VISIT - [id number]}.     Baldemar Friday, CMA 02/08/2022   I, Audry Riles, CMA, am acting as scribe for Truitt Merle, MD.   {Add scribe attestation statement}

## 2022-02-08 NOTE — Assessment & Plan Note (Deleted)
Stage IIA, p(T2N1aM0), ER+/PR+/HER2-, Grade II, Mammaprint luminal type A, low risk -presented with large palpable mass. Breast MRI and PET scan on 11/25/19 were otherwise negative.  -Mammaprint showed low risk disease. -She started neoadjuvant tamoxifen on 12/01/19, tolerated poorly. Dose was reduced to '10mg'$  and she started Zoladex from 12/09/19, and switched to anastrozole 01/06/20 - 06/2020 (held for chemo). Zoladex discontinued 09/16/20.  -she took Verzenio on 01/12/20 - 03/25/20 but tolerated poorly. -initially underwent lumpectomy on 04/15/21 with Dr. Georgette Dover but was found to have a 3.1 cm tumor with positive margins and 2/3 positive lymph nodes. -s/p mastectomy on 05/18/20 showing: residual invasive carcinoma and DCIS, a positive margin, and another 2/12 positive nodes, indicating stage III disease.  -chest CT 06/17/20 and CT AP 07/02/20 were negative. -received one cycle AC on 06/24/20, discontinued due to very poor tolerance. -she completed 12 weeks Taxol 07/08/20 - 10/29/20. -s/p adjuvant RT 10/25/20 - 12/14/20 but developed severe chest pain. -post-treatment PET on 01/06/21 was NED. -she restarted tamoxifen '20mg'$  in 12/2020 due to poor tolerance of anastrozole/Verzenio, held for month of 04/2021 for reconstruction. She is tolerating well with controlled hot flashes on effexor.

## 2022-02-09 ENCOUNTER — Telehealth: Payer: Self-pay | Admitting: Hematology

## 2022-02-09 ENCOUNTER — Inpatient Hospital Stay: Payer: 59

## 2022-02-09 ENCOUNTER — Inpatient Hospital Stay: Payer: 59 | Admitting: Hematology

## 2022-02-09 DIAGNOSIS — Z170 Estrogen receptor positive status [ER+]: Secondary | ICD-10-CM

## 2022-02-09 NOTE — Telephone Encounter (Signed)
Per 1/23 IB, called pt multiple times with no answer  Per 1/25 IB, r/s pt to later date, left message with pt

## 2022-02-22 NOTE — Progress Notes (Unsigned)
Caitlyn Miller   [phone] [phone]   Clinic Follow up Note   Patient Care Team: Pcp, No as PCP - General Mansouraty, Telford Nab., MD as Consulting Physician (Gastroenterology) Donnie Mesa, MD as Consulting Physician (General Surgery) Truitt Merle, MD as Consulting Physician (Hematology) Kyung Rudd, MD as Consulting Physician (Radiation Oncology) Contogiannis, Audrea Muscat, MD as Consulting Physician (Plastic Surgery) Alla Feeling, NP as Nurse Practitioner (Nurse Practitioner) Tressa Busman, MD (Plastic Surgery)  Date of Service:  02/23/2022  CHIEF COMPLAINT: f/u of  right breast cancer   CURRENT THERAPY:   Antiestrogen therapy started 11/2019, currently on tamoxifen   ASSESSMENT:  Caitlyn Miller is a 37 y.o. female with   Malignant neoplasm of upper-outer quadrant of right breast in female, estrogen receptor positive ([place]) Stage IIA, p(T2N1aM0), ER+/PR+/HER2-, Grade II, Mammaprint luminal type A, low risk -presented with large palpable mass. Breast MRI and PET scan on 11/25/19 were otherwise negative.  -Mammaprint showed low risk disease. -She started neoadjuvant tamoxifen on 12/01/19, tolerated poorly. Dose was reduced to '10mg'$  and she started Zoladex from 12/09/19, and switched to anastrozole 01/06/20 - 06/2020 (held for chemo). Zoladex discontinued 09/16/20.  -she took Verzenio on 01/12/20 - 03/25/20 but tolerated poorly. -initially underwent lumpectomy on 04/15/21 with Dr. Georgette Dover but was found to have a 3.1 cm tumor with positive margins and 2/3 positive lymph nodes. -s/p mastectomy on 05/18/20 showing: residual invasive carcinoma and DCIS, a positive margin, and another 2/12 positive nodes, indicating stage III disease.  -chest CT 06/17/20 and CT AP 07/02/20 were negative. -received one cycle AC on 06/24/20, discontinued due to very poor tolerance. -she completed 12 weeks Taxol 07/08/20 - 10/29/20. -s/p adjuvant RT 10/25/20 - 12/14/20 but developed severe  chest pain. -post-treatment PET on 01/06/21 was NED. -she restarted tamoxifen '20mg'$  in 12/2020 due to poor tolerance of anastrozole/Verzenio, held for month of 04/2021 for reconstruction. She is tolerating well with controlled hot flashes on effexor. --most recent left mammogram 06/09/21 was negative. -she had breast implant placement on right and exchange on left side on 11/18/2021 -she is clinically doing well, no complains except mild pain from previous right side flap surgery.  She is very concerned about high risk of recurrence, would like to have a surveillance PET scan.  -She tolerating tamoxifen well, will continue.  PLAN: - lab reviewed. - Continue Tamoxifen -Order PET Scan to be done in 2-3 weeks  -I refill Zofran, she uses occasionally. -order Mammogram 05/2022 -lab,f/u in 4 months  SUMMARY OF ONCOLOGIC HISTORY: Oncology History Overview Note  Cancer Staging Malignant neoplasm of upper-outer quadrant of right breast in female, estrogen receptor positive ([place]) Staging form: Breast, AJCC 8th Edition - Clinical stage from 11/12/2019: Stage IIA (cT2, cN1, cM0, G2, ER+, PR+, HER2-) - Signed by Truitt Merle, MD on 11/19/2019 Stage prefix: Initial diagnosis Histologic grading system: 3 grade system - Pathologic stage from 04/15/2020: No Stage Recommended (ypT2, pN2a, cM0, G2, ER+, PR+, HER2-) - Signed by Truitt Merle, MD on 05/27/2020 Stage prefix: Post-therapy Histologic grading system: 3 grade system Residual tumor (R): R1 - Microscopic    Malignant neoplasm of upper-outer quadrant of right breast in female, estrogen receptor positive ([place])  11/05/2019 Mammogram   IMPRESSION: Large irregular palpable mass 4.7 x 1.4 x 1.4 cm within the upper-outer right breast 11 o'clock position, 4 cm from nipple.  There is a large area of associated coarse heterogeneous calcifications within the mass. Overall findings are concerning for breast carcinoma.  Two cortically thickened right axillary lymph  nodes which are indeterminate in etiology.   11/12/2019 Cancer Staging   Staging form: Breast, AJCC 8th Edition - Clinical stage from 11/12/2019: Stage IIA (cT2, cN1, cM0, G2, ER+, PR+, HER2-) - Signed by Truitt Merle, MD on 11/19/2019   11/12/2019 Initial Biopsy   Diagnosis 1. Breast, right, needle core biopsy, right - INVASIVE MAMMARY CARCINOMA, SEE COMMENT. - MAMMARY CARCINOMA IN SITU. 2. Lymph node, needle/core biopsy, right - METASTATIC MAMMARY CARCINOMA. Microscopic Comment 1. The carcinoma appears grade 2 and measures 16 mm in greatest linear extent. E-cadherin will be ordered. Prognostic makers will be ordered. Dr. Saralyn Pilar has reviewed the case. The case was called to [facility] on 01/13/2020.    1. E-cadherin is strongly positive consistent with a ductal phenotype.   11/12/2019 Receptors her2   1. PROGNOSTIC INDICATORS Results: IMMUNOHISTOCHEMICAL AND MORPHOMETRIC ANALYSIS PERFORMED MANUALLY The tumor cells are NEGATIVE for Her2 (0). Estrogen Receptor: 100%, POSITIVE, STRONG STAINING INTENSITY Progesterone Receptor: 100%, POSITIVE, STRONG STAINING INTENSITY Proliferation Marker Ki67: 5%   11/17/2019 Initial Diagnosis   Malignant neoplasm of upper-outer quadrant of right breast in female, estrogen receptor positive ([place])   11/25/2019 Breast MRI   IMPRESSION: 1. Large area of non-mass enhancement involving the UPPER OUTER QUADRANT of the RIGHT breast measuring approximately 4.2 x 4.1 x 2.4 cm. The biopsy-proven IDC and DCIS is present at the superomedial aspect of this NME. 2. No MRI evidence of malignancy involving the LEFT breast. 3. Intact BILATERAL retropectoral implants. 4. 2 pathologically enlarged RIGHT axillary lymph nodes. One of these nodes is biopsy-proven metastatic disease. No pathologic lymphadenopathy elsewhere   11/25/2019 PET scan   IMPRESSION: 1. Two mildly enlarged hypermetabolic right axillary lymph nodes compatible with right  axillary nodal metastases. 2. No additional sites of hypermetabolic metastatic disease. 3. Asymmetric indistinct upper right breast hypermetabolism, without discrete mass correlate on the CT images, compatible with known primary right breast malignancy.     11/26/2019 Genetic Testing   Negative genetic testing: no pathogenic variants detected in [facility] STAT Breast Cancer Panel.  Variant of uncertain significance (VUS) detected in CHEK2 at c.1556G>T (p.Arg519Leu).  The report date is November 26, 2019.   The STAT Breast cancer panel offered by [facility] includes sequencing and rearrangement analysis for the following 9 genes:  ATM, BRCA1, BRCA2, CDH1, CHEK2, PALB2, PTEN, STK11 and TP53.    Results of [facility] Multi-Cancer Panel are pending.    12/01/2019 -  Anti-estrogen oral therapy   Neoadjuvant Tamoxifen '20mg'$  on 12/01/19. Reduced to '10mg'$  on 12/09/19.       ---Zoladex injection monthly starting 12/09/19.       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  Variants of uncertain significance detected RAD51D at c.715C>T (p.Arg239Trp) and CHEK2 at c.1556G>T (p.Arg519Leu).  The report date is December 08, 2019.   The Multi-Cancer Panel offered by [facility] includes sequencing and/or deletion duplication testing of the following 85 genes: AIP, ALK, APC, ATM, AXIN2,BAP1,  BARD1, BLM, BMPR1A, BRCA1, BRCA2, BRIP1, CASR, CDC73, CDH1, CDK4, CDKN1B, CDKN1C, CDKN2A (p14ARF), CDKN2A (p16INK4a), CEBPA, CHEK2, CTNNA1, DICER1, DIS3L2, EGFR (c.2369C>T, p.Thr790Met variant only), EPCAM (Deletion/duplication testing only), FH, FLCN, GATA2, GPC3, GREM1 (Promoter region deletion/duplication testing only), HOXB13 (c.251G>A, p.Gly84Glu), HRAS, KIT, MAX, MEN1, MET, MITF (c.952G>A, p.Glu318Lys variant only), MLH1, MSH2, MSH3, MSH6, MUTYH, NBN, NF1, NF2, NTHL1, PALB2,  PDGFRA, PHOX2B,  PMS2, POLD1, POLE, POT1, PRKAR1A, PTCH1, PTEN, RAD50, RAD51C, RAD51D, RB1, RECQL4, RET, RNF43, RUNX1, SDHAF2, SDHA (sequence changes only), SDHB, SDHC, SDHD, SMAD4, SMARCA4, SMARCB1, SMARCE1, STK11, SUFU, TERC, TERT, TMEM127, TP53, TSC1, TSC2, VHL, WRN and WT1.    12/09/2019 Miscellaneous   Mammaprint luminal type A, low risk  10% risk of recurrence in 10 years with 97.8% benefor of hormaonal therapy alone.    01/12/2020 - 03/25/2020 Chemotherapy   Verzenio started on 01/12/2020, dose reduced to '50mg'$  am and '100mg'$  01/27/2020 due to poor tolerance. Held Verzenio on 03/25/20 to proceed with breast surgery.    03/01/2020 Mammogram   Targeted ultrasound is performed, showing interval decreasing conspicuity of the patient's known right breast cancer. A post biopsy clip with an associated irregular area shadowing is demonstrated at the 11 o'clock position 4 cm from the nipple. Exact measurements are difficult due to the vague appearance of the findings. It measures approximately 1.2 x 0.9 x 0.6 cm (previously 4.7 x 2.2 x 1.5 cm).   IMPRESSION: Imaging findings consistent with good response to chemotherapy.   03/16/2020 Imaging   MRI Breast  IMPRESSION: 1. Interval resolution of RIGHT axillary adenopathy. 2. Slightly smaller area of non mass enhancement in the UPPER-OUTER QUADRANT of the RIGHT breast.   04/15/2020 Surgery   RIGHT BREAST LUMPECTOMY WITH RADIOACTIVE SEED LOCALIZATION and RADIOACTIVE SEED GUIDED RIGHT AXILLARY SENTINEL LYMPH NODE DISSECTION and SENTINEL NODE BIOPSY by Dr Georgette Dover    04/15/2020 Pathology Results   FINAL MICROSCOPIC DIAGNOSIS:   A. 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  - Invasive carcinoma broadly involves each the anterior, posterior,  superior and inferior margins.  -  DCIS involves the inferior margin and is < 1 mm from each the anterior  and superior margins.  - Atypical lobular hyperplasia.  - Biopsy sites.  - See oncology table.   B. TARGETED LYMPH NODE, RIGHT AXILLA, BIOPSY:  - Metastatic carcinoma in (1) of (1) lymph node.  - Biopsy site.   C. SENTINEL LYMPH NODE, RIGHT AXILLA #1, BIOPSY:  - Metastatic carcinoma in (1) of (1) lymph node.   D. SENTINEL LYMPH NODE, RIGHT AXILLA #2, BIOPSY:  - One lymph node, negative for carcinoma (0/1).    04/15/2020 Cancer Staging   Staging form: Breast, AJCC 8th Edition - Pathologic stage from 04/15/2020: No Stage Recommended (ypT2, pN2a, cM0, G2, ER+, PR+, HER2-) - Signed by Truitt Merle, MD on 05/27/2020 Stage prefix: Post-therapy Histologic grading system: 3 grade system Residual tumor (R): R1 - Microscopic   05/18/2020 Surgery   Right Mastectomy  A. BREAST, RIGHT, MASTECTOMY:  - Residual invasive carcinoma with mixed ductal and lobular features.       Residual invasive carcinoma broadly involves the anterior inferior  soft tissue margin.       Residual invasive carcinoma is focally < 1 mm from the deep margin.  - Residual ductal carcinoma in situ.       Residual DCIS is 3 mm from the closest margin, deep.  - Residual atypical lobular hyperplasia.  - Prior procedure site changes.  - Biopsy clip (X2).   B. BREAST IMPLANT, RIGHT, EXPLANTATION:  - Implant (gross only).   C. LYMPH NODES, RIGHT AXILLA, DISSECTION:  - Metastatic carcinoma in (2) of (12) lymph nodes.  - Prior procedure site changes in surrounding soft  tissue.    06/24/2020 - 09/30/2020 Chemotherapy   Patient is on Treatment Plan : BREAST ADJUVANT DOSE DENSE AC q14d / PACLitaxel q7d        INTERVAL HISTORY:  Caitlyn Miller is here for a follow up of  right breast cancer  She was last seen by me on  10/10/2021 She presents to the clinic alone. Pt reports she had the surgery in November. Pt states she is still have some discomfort. She reports  of shooting pain and back discomfort.Pt states she had a draining tube put in ,but had since been removed. Pt denies having stomach and Bm issues. Pt denies having a B12 Injection.     All other systems were reviewed with the patient and are negative.  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   Pelvic pain 09/2008   Personal history of chemotherapy    Personal history of radiation therapy     SURGICAL HISTORY: Past Surgical History:  Procedure Laterality Date   AUGMENTATION MAMMAPLASTY Bilateral 1027   silicone    BREAST IMPLANT REMOVAL Right 05/18/2020   Procedure: REMOVAL OF RIGHT BREAST IMPLANT;  Surgeon: Donnie Mesa, MD;  Location: [facility];  Service: General;  Laterality: Right;   BREAST LUMPECTOMY WITH RADIOACTIVE SEED LOCALIZATION Right 04/15/2020   Procedure: RIGHT BREAST LUMPECTOMY WITH RADIOACTIVE SEED LOCALIZATION;  Surgeon: Donnie Mesa, MD;  Location: [place];  Service: General;  Laterality: Right;   MASTECTOMY     MODIFIED MASTECTOMY Right 05/18/2020   Procedure: RIGHT MODIFIED RADICAL MASTECTOMY;  Surgeon: Donnie Mesa, MD;  Location: [facility];  Service: General;  Laterality: Right;   PORTACATH PLACEMENT Left 06/10/2020   Procedure: INSERTION PORT-A-CATH;  Surgeon: Donnie Mesa, MD;  Location: [facility];  Service: General;  Laterality: Left;  45 MINUTES ROOM 2    RADIOACTIVE SEED GUIDED AXILLARY SENTINEL LYMPH NODE Right 04/15/2020   Procedure: RADIOACTIVE SEED GUIDED RIGHT AXILLARY SENTINEL LYMPH NODE DISSECTION;  Surgeon: Donnie Mesa, MD;  Location: [facility];  Service: General;  Laterality: Right;   SENTINEL NODE BIOPSY N/A 04/15/2020   Procedure: SENTINEL NODE BIOPSY;  Surgeon: Donnie Mesa, MD;  Location: [facility];  Service: General;  Laterality: N/A;   WISDOM TOOTH EXTRACTION      I have reviewed the social history and family history with the patient and they are unchanged from previous note.  ALLERGIES:  is allergic to ibuprofen.  MEDICATIONS:  Current Outpatient Medications  Medication Sig Dispense Refill   acetaminophen (TYLENOL) 500 MG tablet Take 500 mg by mouth every 6 (six) hours as needed for moderate pain.     clindamycin (CLINDAGEL) 1 % gel Apply topically 2 (two) times daily. 30 g 0   gabapentin (NEURONTIN) 100 MG capsule  TAKE 2 CAPSULES(200 MG) BY MOUTH AT BEDTIME 60 capsule 1   hydrocortisone 1 % lotion Apply 1 application topically daily as needed (rash). 118 mL 0   ondansetron (ZOFRAN ODT) 8 MG disintegrating tablet Take 1 tablet (8 mg total) by mouth every 8 (eight) hours as needed for nausea or vomiting. 30 tablet 0   pantoprazole (PROTONIX) 40 MG tablet TAKE 1 TABLET(40 MG) BY MOUTH DAILY 30 tablet 3   prochlorperazine (COMPAZINE) 10 MG tablet TAKE 1 TABLET(10 MG) BY MOUTH EVERY 8 HOURS AS NEEDED FOR NAUSEA OR VOMITING 30 tablet 1   tamoxifen (NOLVADEX) 20 MG tablet Take 1 tablet (20 mg total) by mouth daily. 90 tablet 1   venlafaxine (EFFEXOR) 37.5 MG tablet Take 1 tablet (37.5 mg total) by mouth daily. 90 tablet 1   No current facility-administered medications for this visit.    PHYSICAL EXAMINATION: ECOG PERFORMANCE STATUS: 0 - Asymptomatic  Vitals:   02/23/22 1103  BP: 116/83  Pulse: 77  Resp: 18  Temp: 98.4 F (36.9 C)  SpO2: 100%   Wt Readings from Last 3 Encounters:   02/23/22 144 lb 9.6 oz (65.6 kg)  10/10/21 144 lb (65.3 kg)  05/16/21 138 lb 11.2 oz (62.9 kg)     NECK: (-)supple, thyroid normal size, non-tender, without nodularity LYMPH:(-)   no palpable lymphadenopathy in the cervical, axillary  Breast: The RT Breast Implant  Placement and LT Breast exchange. Healing very well. No swelling , no bruising. No palpable mass. Breast exam benign. LABORATORY DATA:  I have reviewed the data as listed    Latest Ref Rng & Units 02/23/2022   10:42 AM 10/10/2021   12:39 PM 05/16/2021    8:41 AM  CBC  WBC 4.0 - 10.5 K/uL 6.7  8.7  7.5   Hemoglobin 12.0 - 15.0 g/dL 11.2  11.2  11.3   Hematocrit 36.0 - 46.0 % 32.9  33.5  33.2   Platelets 150 - 400 K/uL 299  328  353         Latest Ref Rng & Units 02/23/2022   10:42 AM 10/10/2021   12:39 PM 05/16/2021    8:41 AM  CMP  Glucose 70 - 99 mg/dL 77  121  111   BUN 6 - 20 mg/dL '10  11  11   '$ Creatinine 0.44 - 1.00 mg/dL 0.58  0.66  0.57   Sodium 135 - 145 mmol/L 140  138  141   Potassium 3.5 - 5.1 mmol/L 3.8  4.0  3.6   Chloride 98 - 111 mmol/L 106  106  107   CO2 22 - 32 mmol/L '25  28  27   '$ Calcium 8.9 - 10.3 mg/dL 9.2  9.2  9.1   Total Protein 6.5 - 8.1 g/dL 6.6  7.4  6.7   Total Bilirubin 0.3 - 1.2 mg/dL 0.4  0.4  0.3   Alkaline Phos 38 - 126 U/L 65  67  58   AST 15 - 41 U/L 27  26  35   ALT 0 - 44 U/L 42  37  54       RADIOGRAPHIC STUDIES: I have personally reviewed the radiological images as listed and agreed with the findings in the report. No results found.    Orders Placed This Encounter  Procedures   MM Digital Screening Unilat L    Standing Status:   Future    Standing Expiration Date:   02/23/2023    Order Specific Question:  Reason for Exam (SYMPTOM  OR DIAGNOSIS REQUIRED)    Answer:   screening    Order Specific Question:   Is the patient pregnant?    Answer:   No    Order Specific Question:   Preferred imaging location?    Answer:   [facility]   Ferritin    Add to lab draw  this morning    Standing Status:   Future    Number of Occurrences:   1    Standing Expiration Date:   02/24/2023   Iron and Iron Binding Capacity ([facility] only)    Add to lab draw this morning    Standing Status:   Future    Number of Occurrences:   1    Standing Expiration Date:   02/24/2023   All questions were answered. The patient knows to call the clinic with any problems, questions or concerns. No barriers to learning was detected. The total time spent in the appointment was 30 minutes.     Truitt Merle, MD 02/23/2022   Felicity Coyer, CMA, am acting as scribe for Truitt Merle, MD.   I have reviewed the above documentation for accuracy and completeness, and I agree with the above.

## 2022-02-23 ENCOUNTER — Inpatient Hospital Stay: Payer: 59 | Attending: Nurse Practitioner

## 2022-02-23 ENCOUNTER — Other Ambulatory Visit: Payer: Self-pay

## 2022-02-23 ENCOUNTER — Inpatient Hospital Stay (HOSPITAL_BASED_OUTPATIENT_CLINIC_OR_DEPARTMENT_OTHER): Payer: 59 | Admitting: Hematology

## 2022-02-23 ENCOUNTER — Encounter: Payer: Self-pay | Admitting: Hematology

## 2022-02-23 VITALS — BP 116/83 | HR 77 | Temp 98.4°F | Resp 18 | Ht 64.0 in | Wt 144.6 lb

## 2022-02-23 DIAGNOSIS — Z170 Estrogen receptor positive status [ER+]: Secondary | ICD-10-CM

## 2022-02-23 DIAGNOSIS — C50411 Malignant neoplasm of upper-outer quadrant of right female breast: Secondary | ICD-10-CM | POA: Diagnosis present

## 2022-02-23 DIAGNOSIS — Z1231 Encounter for screening mammogram for malignant neoplasm of breast: Secondary | ICD-10-CM | POA: Diagnosis not present

## 2022-02-23 DIAGNOSIS — Z79810 Long term (current) use of selective estrogen receptor modulators (SERMs): Secondary | ICD-10-CM | POA: Diagnosis not present

## 2022-02-23 LAB — IRON AND IRON BINDING CAPACITY (CC-WL,HP ONLY)
Iron: 96 ug/dL (ref 28–170)
Saturation Ratios: 25 % (ref 10.4–31.8)
TIBC: 385 ug/dL (ref 250–450)
UIBC: 289 ug/dL (ref 148–442)

## 2022-02-23 LAB — COMPREHENSIVE METABOLIC PANEL
ALT: 42 U/L (ref 0–44)
AST: 27 U/L (ref 15–41)
Albumin: 3.9 g/dL (ref 3.5–5.0)
Alkaline Phosphatase: 65 U/L (ref 38–126)
Anion gap: 9 (ref 5–15)
BUN: 10 mg/dL (ref 6–20)
CO2: 25 mmol/L (ref 22–32)
Calcium: 9.2 mg/dL (ref 8.9–10.3)
Chloride: 106 mmol/L (ref 98–111)
Creatinine, Ser: 0.58 mg/dL (ref 0.44–1.00)
GFR, Estimated: >60 mL/min (ref >60)
Glucose, Bld: 77 mg/dL (ref 70–99)
Potassium: 3.8 mmol/L (ref 3.5–5.1)
Sodium: 140 mmol/L (ref 135–145)
Total Bilirubin: 0.4 mg/dL (ref 0.3–1.2)
Total Protein: 6.6 g/dL (ref 6.5–8.1)

## 2022-02-23 LAB — CBC WITH DIFFERENTIAL/PLATELET
Abs Immature Granulocytes: 0.01 10*3/uL (ref 0.00–0.07)
Basophils Absolute: 0 10*3/uL (ref 0.0–0.1)
Basophils Relative: 1 %
Eosinophils Absolute: 0.1 10*3/uL (ref 0.0–0.5)
Eosinophils Relative: 2 %
HCT: 32.9 % — ABNORMAL LOW (ref 36.0–46.0)
Hemoglobin: 11.2 g/dL — ABNORMAL LOW (ref 12.0–15.0)
Immature Granulocytes: 0 %
Lymphocytes Relative: 38 %
Lymphs Abs: 2.5 10*3/uL (ref 0.7–4.0)
MCH: 30.6 pg (ref 26.0–34.0)
MCHC: 34 g/dL (ref 30.0–36.0)
MCV: 89.9 fL (ref 80.0–100.0)
Monocytes Absolute: 0.5 10*3/uL (ref 0.1–1.0)
Monocytes Relative: 8 %
Neutro Abs: 3.5 10*3/uL (ref 1.7–7.7)
Neutrophils Relative %: 51 %
Platelets: 299 10*3/uL (ref 150–400)
RBC: 3.66 MIL/uL — ABNORMAL LOW (ref 3.87–5.11)
RDW: 13.1 % (ref 11.5–15.5)
WBC: 6.7 10*3/uL (ref 4.0–10.5)
nRBC: 0 % (ref 0.0–0.2)

## 2022-02-23 LAB — FERRITIN: Ferritin: 14 ng/mL (ref 11–307)

## 2022-02-23 LAB — VITAMIN B12: Vitamin B-12: 351 pg/mL (ref 180–914)

## 2022-02-23 MED ORDER — TAMOXIFEN CITRATE 20 MG PO TABS: 20.0000 mg | ORAL_TABLET | Freq: Every day | ORAL | 1 refills | Status: DC

## 2022-02-23 MED ORDER — ONDANSETRON 8 MG PO TBDP: 8.0000 mg | ORAL_TABLET | Freq: Three times a day (TID) | ORAL | 0 refills | Status: DC | PRN

## 2022-02-23 NOTE — Assessment & Plan Note (Addendum)
Stage IIA, p(T2N1aM0), ER+/PR+/HER2-, Grade II, Mammaprint luminal type A, low risk -presented with large palpable mass. Breast MRI and PET scan on 11/25/19 were otherwise negative.  -Mammaprint showed low risk disease. -She started neoadjuvant tamoxifen on 12/01/19, tolerated poorly. Dose was reduced to '10mg'$  and she started Zoladex from 12/09/19, and switched to anastrozole 01/06/20 - 06/2020 (held for chemo). Zoladex discontinued 09/16/20.  -she took Verzenio on 01/12/20 - 03/25/20 but tolerated poorly. -initially underwent lumpectomy on 04/15/21 with Dr. Georgette Dover but was found to have a 3.1 cm tumor with positive margins and 2/3 positive lymph nodes. -s/p mastectomy on 05/18/20 showing: residual invasive carcinoma and DCIS, a positive margin, and another 2/12 positive nodes, indicating stage III disease.  -chest CT 06/17/20 and CT AP 07/02/20 were negative. -received one cycle AC on 06/24/20, discontinued due to very poor tolerance. -she completed 12 weeks Taxol 07/08/20 - 10/29/20. -s/p adjuvant RT 10/25/20 - 12/14/20 but developed severe chest pain. -post-treatment PET on 01/06/21 was NED. -she restarted tamoxifen '20mg'$  in 12/2020 due to poor tolerance of anastrozole/Verzenio, held for month of 04/2021 for reconstruction. She is tolerating well with controlled hot flashes on effexor. --most recent left mammogram 06/09/21 was negative. -she had breast implant placement on right and exchange on left side on 11/18/2021

## 2022-02-27 ENCOUNTER — Other Ambulatory Visit: Payer: Self-pay

## 2022-02-27 ENCOUNTER — Telehealth: Payer: Self-pay

## 2022-02-27 NOTE — Telephone Encounter (Signed)
Pt's husband LVM stating that his wife is supposed to be getting IV iron infusions and is requesting a call back to get the infusion scheduled.  The RN reviewed the pt's most recent labs on 02/23/2022.  Notified Dr. Burr Medico of the pt's husband's call and Dr. Burr Medico stated she will contact them to further discuss the pt's labs and symptoms.

## 2022-03-07 ENCOUNTER — Other Ambulatory Visit: Payer: Self-pay

## 2022-03-07 DIAGNOSIS — Z170 Estrogen receptor positive status [ER+]: Secondary | ICD-10-CM

## 2022-03-20 ENCOUNTER — Telehealth: Payer: Self-pay

## 2022-03-20 ENCOUNTER — Other Ambulatory Visit: Payer: Self-pay | Admitting: Hematology

## 2022-03-20 DIAGNOSIS — Z170 Estrogen receptor positive status [ER+]: Secondary | ICD-10-CM

## 2022-03-20 NOTE — Telephone Encounter (Signed)
This RN was notified by Otilio Carpen in [employer] via [place] chat that pt's PET Scan was denied by pt's insurance.  Notified Dr. Burr Medico.

## 2022-03-21 ENCOUNTER — Other Ambulatory Visit: Payer: Self-pay

## 2022-03-24 ENCOUNTER — Other Ambulatory Visit (HOSPITAL_COMMUNITY): Payer: 59

## 2022-04-03 ENCOUNTER — Ambulatory Visit (HOSPITAL_COMMUNITY)
Admission: RE | Admit: 2022-04-03 | Discharge: 2022-04-03 | Disposition: A | Discharge status: Home / Self Care | Payer: 59 | Source: Ambulatory Visit | Attending: Hematology | Admitting: Hematology

## 2022-04-03 ENCOUNTER — Encounter (HOSPITAL_COMMUNITY): Payer: Self-pay

## 2022-04-03 DIAGNOSIS — Z170 Estrogen receptor positive status [ER+]: Secondary | ICD-10-CM | POA: Insufficient documentation

## 2022-04-03 DIAGNOSIS — C50411 Malignant neoplasm of upper-outer quadrant of right female breast: Secondary | ICD-10-CM | POA: Diagnosis present

## 2022-04-03 MED ORDER — SODIUM CHLORIDE (PF) 0.9 % IJ SOLN
INTRAMUSCULAR | Status: AC
  Filled 2022-04-03: qty 50

## 2022-04-03 MED ORDER — IOHEXOL 300 MG/ML  SOLN
100.0000 mL | Freq: Once | INTRAMUSCULAR | Status: AC | PRN
  Administered 2022-04-03: 100 mL via INTRAVENOUS

## 2022-04-13 ENCOUNTER — Telehealth: Payer: Self-pay | Admitting: Hematology

## 2022-04-13 NOTE — Telephone Encounter (Signed)
Contacted patient to scheduled appointments. Left message with appointment details and a call back number if patient had any questions or could not accommodate the time we provided.   

## 2022-04-23 NOTE — Progress Notes (Unsigned)
Patient Care Team: Pcp, No as PCP - General Mansouraty, Netty StarringGabriel Jr., MD as Consulting Physician (Gastroenterology) Manus Ruddsuei, Matthew, MD as Consulting Physician (General Surgery) Malachy MoodFeng, Yan, MD as Consulting Physician (Hematology) Dorothy PufferMoody, John, MD as Consulting Physician (Radiation Oncology) Contogiannis, Chales AbrahamsMary Ann, MD as Consulting Physician (Plastic Surgery) Pollyann SamplesBurton, Cesar Rogerson K, NP as Nurse Practitioner (Nurse Practitioner) Tamela OddiPestana, Ivo, MD (Plastic Surgery)   CHIEF COMPLAINT: Follow up right breast cancer   Oncology History Overview Note  Cancer Staging Malignant neoplasm of upper-outer quadrant of right breast in female, estrogen receptor positive [facility](HCC) Staging form: Breast, AJCC 8th Edition - Clinical stage from 11/12/2019: Stage IIA (cT2, cN1, cM0, G2, ER+, PR+, HER2-) - Signed by Malachy MoodFeng, Yan, MD on 11/19/2019 Stage prefix: Initial diagnosis Histologic grading system: 3 grade system - Pathologic stage from 04/15/2020: No Stage Recommended (ypT2, pN2a, cM0, G2, ER+, PR+, HER2-) - Signed by Malachy MoodFeng, Yan, MD on 05/27/2020 Stage prefix: Post-therapy Histologic grading system: 3 grade system Residual tumor (R): R1 - Microscopic    Malignant neoplasm of upper-outer quadrant of right breast in female, estrogen receptor positive  11/05/2019 Mammogram   IMPRESSION: Large irregular palpable mass 4.7 x 1.4 x 1.4 cm within the upper-outer right breast 11 o'clock position, 4 cm from nipple.  There is a large area of associated coarse heterogeneous calcifications within the mass. Overall findings are concerning for breast carcinoma.   Two cortically thickened right axillary lymph nodes which are indeterminate in etiology.   11/12/2019 Cancer Staging   Staging form: Breast, AJCC 8th Edition - Clinical stage from 11/12/2019: Stage IIA (cT2, cN1, cM0, G2, ER+, PR+, HER2-) - Signed by Malachy MoodFeng, Yan, MD on 11/19/2019   11/12/2019 Initial Biopsy   Diagnosis 1. Breast, right, needle core biopsy, right -  INVASIVE MAMMARY CARCINOMA, SEE COMMENT. - MAMMARY CARCINOMA IN SITU. 2. Lymph node, needle/core biopsy, right - METASTATIC MAMMARY CARCINOMA. Microscopic Comment 1. The carcinoma appears grade 2 and measures 16 mm in greatest linear extent. E-cadherin will be ordered. Prognostic makers will be ordered. Dr. Luisa HartPatrick has reviewed the case. [facility]    1. E-cadherin is strongly positive consistent with a ductal phenotype.   11/12/2019 Receptors her2   1. PROGNOSTIC INDICATORS Results: IMMUNOHISTOCHEMICAL AND MORPHOMETRIC ANALYSIS PERFORMED MANUALLY The tumor cells are NEGATIVE for Her2 (0). Estrogen Receptor: 100%, POSITIVE, STRONG STAINING INTENSITY Progesterone Receptor: 100%, POSITIVE, STRONG STAINING INTENSITY Proliferation Marker Ki67: 5%   11/17/2019 Initial Diagnosis   Malignant neoplasm of upper-outer quadrant of right breast in female, estrogen receptor positive (HCC)   11/25/2019 Breast MRI   IMPRESSION: 1. Large area of non-mass enhancement involving the UPPER OUTER QUADRANT of the RIGHT breast measuring approximately 4.2 x 4.1 x 2.4 cm. The biopsy-proven IDC and DCIS is present at the superomedial aspect of this NME. 2. No MRI evidence of malignancy involving the LEFT breast. 3. Intact BILATERAL retropectoral implants. 4. 2 pathologically enlarged RIGHT axillary lymph nodes. One of these nodes is biopsy-proven metastatic disease. No pathologic lymphadenopathy elsewhere   11/25/2019 PET scan   IMPRESSION: 1. Two mildly enlarged hypermetabolic right axillary lymph nodes compatible with right axillary nodal metastases. 2. No additional sites of hypermetabolic metastatic disease. 3. Asymmetric indistinct upper right breast hypermetabolism, without discrete mass correlate on the CT images, compatible with known primary right breast malignancy.     11/26/2019 Genetic Testing   Negative genetic testing: no  pathogenic variants detected in [facility] STAT Breast Cancer Panel.  Variant of uncertain significance (VUS) detected in CHEK2 at c.1556G>T (p.Arg519Leu).  The report date is November 26, 2019.   The STAT Breast cancer panel offered by [facility] includes sequencing and rearrangement analysis for the following 9 genes:  ATM, BRCA1, BRCA2, CDH1, CHEK2, PALB2, PTEN, STK11 and TP53.    Results of [facility] Multi-Cancer Panel are pending.    12/01/2019 -  Anti-estrogen oral therapy   Neoadjuvant Tamoxifen 20mg  on 12/01/19. Reduced to 10mg  on 12/09/19.       ---Zoladex injection monthly starting 12/09/19.       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  Variants of uncertain significance detected RAD51D at c.715C>T (p.Arg239Trp) and CHEK2 at c.1556G>T (p.Arg519Leu).  The report date is December 08, 2019.   The Multi-Cancer Panel offered by [facility] includes sequencing and/or deletion duplication testing of the following 85 genes: AIP, ALK, APC, ATM, AXIN2,BAP1,  BARD1, BLM, BMPR1A, BRCA1, BRCA2, BRIP1, CASR, CDC73, CDH1, CDK4, CDKN1B, CDKN1C, CDKN2A (p14ARF), CDKN2A (p16INK4a), CEBPA, CHEK2, CTNNA1, DICER1, DIS3L2, EGFR (c.2369C>T, p.Thr790Met variant only), EPCAM (Deletion/duplication testing only), FH, FLCN, GATA2, GPC3, GREM1 (Promoter region deletion/duplication testing only), HOXB13 (c.251G>A, p.Gly84Glu), HRAS, KIT, MAX, MEN1, MET, MITF (c.952G>A, p.Glu318Lys variant only), MLH1, MSH2, MSH3, MSH6, MUTYH, NBN, NF1, NF2, NTHL1, PALB2, PDGFRA, PHOX2B, PMS2, POLD1, POLE, POT1, PRKAR1A, PTCH1, PTEN, RAD50, RAD51C, RAD51D, RB1, RECQL4, RET, RNF43, RUNX1, SDHAF2, SDHA (sequence changes only), SDHB, SDHC, SDHD, SMAD4, SMARCA4, SMARCB1, SMARCE1, STK11, SUFU, TERC, TERT, TMEM127, TP53, TSC1, TSC2, VHL, WRN and WT1.    12/09/2019 Miscellaneous   Mammaprint  luminal type A, low risk  10% risk of recurrence in 10 years with 97.8% benefor of hormaonal therapy alone.    01/12/2020 - 03/25/2020 Chemotherapy   Verzenio started on 01/12/2020, dose reduced to 50mg  am and 100mg  01/27/2020 due to poor tolerance. Held Verzenio on 03/25/20 to proceed with breast surgery.    03/01/2020 Mammogram   Targeted ultrasound is performed, showing interval decreasing conspicuity of the patient's known right breast cancer. A post biopsy clip with an associated irregular area shadowing is demonstrated at the 11 o'clock position 4 cm from the nipple. Exact measurements are difficult due to the vague appearance of the findings. It measures approximately 1.2 x 0.9 x 0.6 cm (previously 4.7 x 2.2 x 1.5 cm).   IMPRESSION: Imaging findings consistent with good response to chemotherapy.   03/16/2020 Imaging   MRI Breast  IMPRESSION: 1. Interval resolution of RIGHT axillary adenopathy. 2. Slightly smaller area of non mass enhancement in the UPPER-OUTER QUADRANT of the RIGHT breast.   04/15/2020 Surgery   RIGHT BREAST LUMPECTOMY WITH RADIOACTIVE SEED LOCALIZATION and RADIOACTIVE SEED GUIDED RIGHT AXILLARY SENTINEL LYMPH NODE DISSECTION and SENTINEL NODE BIOPSY by Dr Corliss Skains    04/15/2020 Pathology Results   FINAL MICROSCOPIC DIAGNOSIS:   A. 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  - Invasive carcinoma broadly involves each the anterior, posterior,  superior and inferior margins.  - DCIS involves the inferior margin and is < 1 mm from each the anterior  and superior margins.  - Atypical lobular hyperplasia.  - Biopsy sites.  - See oncology table.   B. TARGETED LYMPH NODE, RIGHT AXILLA, BIOPSY:  - Metastatic carcinoma in (1)  of (1) lymph node.  - Biopsy site.   C. SENTINEL LYMPH NODE,  RIGHT AXILLA #1, BIOPSY:  - Metastatic carcinoma in (1) of (1) lymph node.   D. SENTINEL LYMPH NODE, RIGHT AXILLA #2, BIOPSY:  - One lymph node, negative for carcinoma (0/1).    04/15/2020 Cancer Staging   Staging form: Breast, AJCC 8th Edition - Pathologic stage from 04/15/2020: No Stage Recommended (ypT2, pN2a, cM0, G2, ER+, PR+, HER2-) - Signed by Malachy Mood, MD on 05/27/2020 Stage prefix: Post-therapy Histologic grading system: 3 grade system Residual tumor (R): R1 - Microscopic   05/18/2020 Surgery   Right Mastectomy  A. BREAST, RIGHT, MASTECTOMY:  - Residual invasive carcinoma with mixed ductal and lobular features.       Residual invasive carcinoma broadly involves the anterior inferior  soft tissue margin.       Residual invasive carcinoma is focally < 1 mm from the deep margin.  - Residual ductal carcinoma in situ.       Residual DCIS is 3 mm from the closest margin, deep.  - Residual atypical lobular hyperplasia.  - Prior procedure site changes.  - Biopsy clip (X2).   B. BREAST IMPLANT, RIGHT, EXPLANTATION:  - Implant (gross only).   C. LYMPH NODES, RIGHT AXILLA, DISSECTION:  - Metastatic carcinoma in (2) of (12) lymph nodes.  - Prior procedure site changes in surrounding soft tissue.    06/24/2020 - 09/30/2020 Chemotherapy   Patient is on Treatment Plan : BREAST ADJUVANT DOSE DENSE AC q14d / PACLitaxel q7d        CURRENT THERAPY: Antiestrogen therapy, started 11/2019, currently on Tamoxifen  INTERVAL HISTORY Ms. Stankus returns for follow up as scheduled. Last seen by Dr. Mosetta Putt 02/23/22. Seen by plastic surgery 04/18/22. She underwent surveillance CT recently, here for results. Continues tamoxifen.   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   Pelvic pain 09/2008   Personal history of chemotherapy    Personal history of radiation therapy      Past Surgical History:  Procedure Laterality Date   AUGMENTATION MAMMAPLASTY Bilateral 2020   silicone    BREAST IMPLANT REMOVAL Right 05/18/2020   Procedure: REMOVAL OF RIGHT BREAST IMPLANT;  Surgeon: Manus Rudd, MD;  Location: [facility];  Service: General;  Laterality: Right;   BREAST LUMPECTOMY WITH RADIOACTIVE SEED LOCALIZATION Right 04/15/2020   Procedure: RIGHT BREAST LUMPECTOMY WITH RADIOACTIVE SEED LOCALIZATION;  Surgeon: Manus Rudd, MD;  Location: [facility];  Service: General;  Laterality: Right;   MASTECTOMY     MODIFIED MASTECTOMY Right 05/18/2020   Procedure: RIGHT MODIFIED RADICAL MASTECTOMY;  Surgeon: Manus Rudd, MD;  Location: [facility];  Service: General;  Laterality: Right;   PORTACATH PLACEMENT Left 06/10/2020   Procedure: INSERTION PORT-A-CATH;  Surgeon: Corliss Skains,  Molli Hazard, MD;  Location: [facility];  Service: General;  Laterality: Left;  45 MINUTES ROOM 2   RADIOACTIVE SEED GUIDED AXILLARY SENTINEL LYMPH NODE Right 04/15/2020   Procedure: RADIOACTIVE SEED GUIDED RIGHT AXILLARY SENTINEL LYMPH NODE DISSECTION;  Surgeon: Manus Rudd, MD;  Location: [facility];  Service: General;  Laterality: Right;   SENTINEL NODE BIOPSY N/A 04/15/2020   Procedure: SENTINEL NODE BIOPSY;  Surgeon: Manus Rudd, MD;  Location: [facility];  Service: General;  Laterality: N/A;   WISDOM TOOTH EXTRACTION       Outpatient Encounter Medications as of 04/25/2022  Medication Sig   hydrocortisone 1 %  lotion Apply 1 application topically daily as needed (rash).   ondansetron (ZOFRAN ODT) 8 MG disintegrating tablet Take 1 tablet (8 mg total) by mouth every 8 (eight) hours as needed for nausea or vomiting.   pantoprazole (PROTONIX) 40 MG tablet TAKE 1 TABLET(40 MG) BY MOUTH DAILY   prochlorperazine (COMPAZINE) 10 MG tablet TAKE 1 TABLET(10 MG) BY MOUTH EVERY 8 HOURS AS NEEDED FOR NAUSEA OR VOMITING   tamoxifen (NOLVADEX) 20 MG tablet Take 1 tablet (20 mg total) by mouth daily.   venlafaxine (EFFEXOR) 37.5 MG tablet Take 1 tablet (37.5 mg total) by mouth daily.   No facility-administered encounter medications on file as of 04/25/2022.     There were no vitals filed for this visit. There is no height or weight on file to calculate BMI.   PHYSICAL EXAM GENERAL:alert, no distress and comfortable SKIN: no rash  EYES: sclera clear NECK: without mass LYMPH:  no palpable cervical or supraclavicular lymphadenopathy  LUNGS: clear with normal breathing effort HEART: regular rate & rhythm, no lower extremity edema ABDOMEN: abdomen soft, non-tender and normal bowel sounds NEURO: alert & oriented x 3 with fluent speech, no focal motor/sensory deficits Breast exam:  PAC without erythema    CBC    Component Value Date/Time   WBC 6.7 02/23/2022 1042   RBC 3.66 (L) 02/23/2022 1042   HGB 11.2 (L) 02/23/2022 1042   HGB 11.6 (L) 04/07/2021 1158   HGB 12.8 05/01/2017 1348   HCT 32.9 (L) 02/23/2022 1042   HCT 39.5 05/01/2017 1348   PLT 299 02/23/2022 1042   PLT 228 04/07/2021 1158   PLT 273 05/01/2017 1348   MCV 89.9 02/23/2022 1042   MCV 95 05/01/2017 1348   MCH 30.6 02/23/2022 1042   MCHC 34.0 02/23/2022 1042   RDW 13.1 02/23/2022 1042   RDW 12.9 05/01/2017 1348   LYMPHSABS 2.5 02/23/2022 1042   MONOABS 0.5 02/23/2022 1042   EOSABS 0.1 02/23/2022 1042   BASOSABS 0.0 02/23/2022 1042     CMP     Component Value Date/Time   NA 140 02/23/2022 1042   NA 142 05/01/2017 1348   K 3.8  02/23/2022 1042   CL 106 02/23/2022 1042   CO2 25 02/23/2022 1042   GLUCOSE 77 02/23/2022 1042   BUN 10 02/23/2022 1042   BUN 7 05/01/2017 1348   CREATININE 0.58 02/23/2022 1042   CREATININE 0.63 04/07/2021 1158   CALCIUM 9.2 02/23/2022 1042   PROT 6.6 02/23/2022 1042   PROT 7.4 05/01/2017 1348   ALBUMIN 3.9 02/23/2022 1042   ALBUMIN 4.7 05/01/2017 1348   AST 27 02/23/2022 1042   AST 23 04/07/2021 1158   ALT 42 02/23/2022 1042   ALT 38 04/07/2021 1158   ALKPHOS 65 02/23/2022 1042   BILITOT 0.4 02/23/2022 1042   BILITOT 0.4  04/07/2021 1158   GFRNONAA >60 02/23/2022 1042   GFRNONAA >60 04/07/2021 1158   GFRAA 137 05/01/2017 1348     ASSESSMENT & PLAN: Caitlyn Miller is a 37 y.o. female with   1. Malignant neoplasm of upper-outer quadrant of right breast, Stage IIA, c(T2N1M0), ER+/PR+/HER2-, Grade II, Mammaprint luminal type A, low risk -Presented with large palpable mass, breast MRI and PET 11/25/19 otherwise negative  -Mammaprint showed low risk disease  -She started neoadjuvant Tamoxifen 12/01/19, tolerated reduced and full dose poorly. Tried zoladex and anastrozole 12/09/19 - 06/2020, discontinued due to poor tolerance -Took Verzenio 01/12/20 - 03/25/20, tolerated poorly -Initially underwent lumpectomy 04/15/20 with Dr. Corliss Skains, found to have 3.1 cm tumor with + margins and 2/3 positive LNs -S/p Mastectomy 05/18/20  showing residual IDC and DCIS, a positive margin, and 2/12 positive nodes; overall stage III -CT 06/2020 were negative  -Received 1 cycle Adjuvant AC, tolerated very poorly and moved to taxol s/p 12 weeks 07/08/20 - 10/29/20 -S/p adjuvant radiation 10/25/09 - 12/14/20  -Post treatment PET 12/22 was NED -Restarted Tamoxifen 20 mg 12/2020, tolerating much better -Reconstruction surgery on 11/17/21 and recent fat graft ***   She has a 4.7cm right breast mass at 11:00 position and two abnormal right axillary LN. Her 11/12/19 biopsy shows grade II invasive ductal carcinoma with  metastasis to her LN, ER/PR strongly positive, HER2 negative, low Ki67. -Her 11/2019 breast MRI and PET scan negative for contralateral malignancy or distant metastasis  -due to her large tumor, she needs mastectomy if she proceeded with surgery first.  She has been seen by Dr. Corliss Skains. Her mastectomy will be complicated by history of implant placement. -MammaPrint showed luminal type A, low risk disease, consistent with strongly ER and PR positivity, negative HER-2 and low Ki-67.  She is unlikely to benefit from chemotherapy, and neoadjuvant chemotherapy is not recommended.  -She started neoadjuvant tamoxifen on 12/01/19, tolerated poorly with nausea, and diffuse body aches. Dose reduced to 10mg  from 12/09/19.  -Started monthly Zoladex injection on 12/09/19, moderate hot flashes stable on gabapentin and effexor.  She switched to AI in 12/2019 but on hold with IV chemo -03/01/2020 ultrasound/mammogram showed good response to treatment, she underwent right lumpectomy and sentinel LN dissection on 04/15/2020, path showed larger than expected tumor 3.1 cm with mixed ductal and lobular features and DCIS, she had positive margin and 2/3 positive lymph nodes -She subsequently underwent mastectomy and axillary node dissection on 05/18/2020 by Dr. Corliss Skains, path showed residual invasive carcinoma and DCIS with positive margin and another 2/12 positive nodes.  Overall stage III disease -Her treatment plan includes adjuvant chemo, postmastectomy radiation and anti-estrogen -Tolerated cycle 1 AC poorly, she declined more chemo with AC and moved to weekly Taxol starting 07/08/2020   2. Generalized Weakness, body pain, Fatigue -Pt notes for the past 3 years she has had mid chest and mid back pain. Previously intermittent, now constant. She also notes b/l arm and leg weakness and pain along with tingling of her toes and fingers. -This has lead to fatigue, low appetite, decreased daily function/activity. -Pt notes prior workup  for this in [place] was negative with Neurologist and cardiologist. They said this is related to stress, anxiety or depression. Pt feels she has an illness causing this instead. -Her 11/25/19 PET was negative for any distant malignancy or metastasis.  -Worse with AC, improved on Taxol    3. Anxiety, depression -More anxious than depressed -Weaned off Lexapro and has started Effexor which is helpful.  Can increase if needed for mood and worsening hot flashes   4. 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   -She has discussed with our genetic counselor   5. Social Support -She is from [place] and lives both there and in [place] with her husband. She has 2 teenage children. The rest of her family is in [place].  -Her mother is here now, but will go home and then return later    PLAN:  No orders of the defined types were placed in this encounter.     All questions were answered. The patient knows to call the clinic with any problems, questions or concerns. No barriers to learning were detected. I spent *** counseling the patient face to face. The total time spent in the appointment was *** and more than 50% was on counseling, review of test results, and coordination of care.   Santiago Glad, NP-C @DATE @

## 2022-04-25 ENCOUNTER — Other Ambulatory Visit: Payer: Self-pay

## 2022-04-25 ENCOUNTER — Inpatient Hospital Stay (HOSPITAL_BASED_OUTPATIENT_CLINIC_OR_DEPARTMENT_OTHER): Payer: 59 | Admitting: Nurse Practitioner

## 2022-04-25 ENCOUNTER — Encounter: Payer: Self-pay | Admitting: Nurse Practitioner

## 2022-04-25 ENCOUNTER — Inpatient Hospital Stay: Payer: 59 | Attending: Nurse Practitioner

## 2022-04-25 VITALS — BP 115/83 | HR 89 | Temp 98.3°F | Resp 16 | Ht 64.0 in | Wt 139.6 lb

## 2022-04-25 DIAGNOSIS — R351 Nocturia: Secondary | ICD-10-CM | POA: Insufficient documentation

## 2022-04-25 DIAGNOSIS — F419 Anxiety disorder, unspecified: Secondary | ICD-10-CM | POA: Diagnosis not present

## 2022-04-25 DIAGNOSIS — F32A Depression, unspecified: Secondary | ICD-10-CM | POA: Insufficient documentation

## 2022-04-25 DIAGNOSIS — C50411 Malignant neoplasm of upper-outer quadrant of right female breast: Secondary | ICD-10-CM | POA: Diagnosis not present

## 2022-04-25 DIAGNOSIS — Z79810 Long term (current) use of selective estrogen receptor modulators (SERMs): Secondary | ICD-10-CM | POA: Insufficient documentation

## 2022-04-25 DIAGNOSIS — R350 Frequency of micturition: Secondary | ICD-10-CM | POA: Insufficient documentation

## 2022-04-25 DIAGNOSIS — Z170 Estrogen receptor positive status [ER+]: Secondary | ICD-10-CM | POA: Diagnosis not present

## 2022-04-25 LAB — COMPREHENSIVE METABOLIC PANEL
ALT: 30 U/L (ref 0–44)
AST: 25 U/L (ref 15–41)
Albumin: 4.1 g/dL (ref 3.5–5.0)
Alkaline Phosphatase: 53 U/L (ref 38–126)
Anion gap: 8 (ref 5–15)
BUN: 13 mg/dL (ref 6–20)
CO2: 22 mmol/L (ref 22–32)
Calcium: 9.3 mg/dL (ref 8.9–10.3)
Chloride: 109 mmol/L (ref 98–111)
Creatinine, Ser: 0.67 mg/dL (ref 0.44–1.00)
GFR, Estimated: >60 mL/min (ref >60)
Glucose, Bld: 103 mg/dL — ABNORMAL HIGH (ref 70–99)
Potassium: 4 mmol/L (ref 3.5–5.1)
Sodium: 139 mmol/L (ref 135–145)
Total Bilirubin: 0.3 mg/dL (ref 0.3–1.2)
Total Protein: 6.8 g/dL (ref 6.5–8.1)

## 2022-04-25 LAB — CBC WITH DIFFERENTIAL/PLATELET
Abs Immature Granulocytes: 0.02 10*3/uL (ref 0.00–0.07)
Basophils Absolute: 0 10*3/uL (ref 0.0–0.1)
Basophils Relative: 1 %
Eosinophils Absolute: 0.1 10*3/uL (ref 0.0–0.5)
Eosinophils Relative: 2 %
HCT: 34.6 % — ABNORMAL LOW (ref 36.0–46.0)
Hemoglobin: 11.5 g/dL — ABNORMAL LOW (ref 12.0–15.0)
Immature Granulocytes: 0 %
Lymphocytes Relative: 40 %
Lymphs Abs: 2.4 10*3/uL (ref 0.7–4.0)
MCH: 30.4 pg (ref 26.0–34.0)
MCHC: 33.2 g/dL (ref 30.0–36.0)
MCV: 91.5 fL (ref 80.0–100.0)
Monocytes Absolute: 0.7 10*3/uL (ref 0.1–1.0)
Monocytes Relative: 11 %
Neutro Abs: 2.8 10*3/uL (ref 1.7–7.7)
Neutrophils Relative %: 46 %
Platelets: 236 10*3/uL (ref 150–400)
RBC: 3.78 MIL/uL — ABNORMAL LOW (ref 3.87–5.11)
RDW: 13.4 % (ref 11.5–15.5)
WBC: 6.1 10*3/uL (ref 4.0–10.5)
nRBC: 0 % (ref 0.0–0.2)

## 2022-04-25 MED ORDER — TAMOXIFEN CITRATE 20 MG PO TABS: 20.0000 mg | ORAL_TABLET | Freq: Every day | ORAL | 6 refills | Status: DC

## 2022-06-22 ENCOUNTER — Ambulatory Visit: Payer: 59 | Admitting: Hematology

## 2022-06-22 ENCOUNTER — Other Ambulatory Visit: Payer: 59

## 2022-06-24 ENCOUNTER — Other Ambulatory Visit: Payer: Self-pay | Admitting: Hematology

## 2022-09-13 ENCOUNTER — Encounter: Payer: Self-pay | Admitting: Hematology

## 2022-09-13 ENCOUNTER — Inpatient Hospital Stay: Payer: 59 | Admitting: Hematology

## 2022-09-13 ENCOUNTER — Inpatient Hospital Stay: Payer: 59 | Attending: Hematology

## 2022-09-13 ENCOUNTER — Other Ambulatory Visit: Payer: Self-pay

## 2022-09-13 VITALS — BP 100/68 | HR 71 | Temp 98.3°F | Resp 16 | Ht 64.0 in | Wt 144.2 lb

## 2022-09-13 DIAGNOSIS — Z79810 Long term (current) use of selective estrogen receptor modulators (SERMs): Secondary | ICD-10-CM | POA: Diagnosis not present

## 2022-09-13 DIAGNOSIS — C50411 Malignant neoplasm of upper-outer quadrant of right female breast: Secondary | ICD-10-CM

## 2022-09-13 DIAGNOSIS — Z170 Estrogen receptor positive status [ER+]: Secondary | ICD-10-CM

## 2022-09-13 LAB — CBC WITH DIFFERENTIAL (CANCER CENTER ONLY)
Abs Immature Granulocytes: 0.03 10*3/uL (ref 0.00–0.07)
Basophils Absolute: 0 10*3/uL (ref 0.0–0.1)
Basophils Relative: 0 %
Eosinophils Absolute: 0.1 10*3/uL (ref 0.0–0.5)
Eosinophils Relative: 1 %
HCT: 33.4 % — ABNORMAL LOW (ref 36.0–46.0)
Hemoglobin: 11.2 g/dL — ABNORMAL LOW (ref 12.0–15.0)
Immature Granulocytes: 0 %
Lymphocytes Relative: 33 %
Lymphs Abs: 2.3 10*3/uL (ref 0.7–4.0)
MCH: 31.5 pg (ref 26.0–34.0)
MCHC: 33.5 g/dL (ref 30.0–36.0)
MCV: 93.8 fL (ref 80.0–100.0)
Monocytes Absolute: 0.7 10*3/uL (ref 0.1–1.0)
Monocytes Relative: 9 %
Neutro Abs: 3.9 10*3/uL (ref 1.7–7.7)
Neutrophils Relative %: 57 %
Platelet Count: 236 10*3/uL (ref 150–400)
RBC: 3.56 MIL/uL — ABNORMAL LOW (ref 3.87–5.11)
RDW: 13.2 % (ref 11.5–15.5)
WBC Count: 7.1 10*3/uL (ref 4.0–10.5)
nRBC: 0 % (ref 0.0–0.2)

## 2022-09-13 LAB — CMP (CANCER CENTER ONLY)
ALT: 64 U/L — ABNORMAL HIGH (ref 0–44)
AST: 40 U/L (ref 15–41)
Albumin: 4.2 g/dL (ref 3.5–5.0)
Alkaline Phosphatase: 58 U/L (ref 38–126)
Anion gap: 7 (ref 5–15)
BUN: 11 mg/dL (ref 6–20)
CO2: 25 mmol/L (ref 22–32)
Calcium: 9.3 mg/dL (ref 8.9–10.3)
Chloride: 109 mmol/L (ref 98–111)
Creatinine: 0.59 mg/dL (ref 0.44–1.00)
GFR, Estimated: >60 mL/min (ref >60)
Glucose, Bld: 87 mg/dL (ref 70–99)
Potassium: 4.2 mmol/L (ref 3.5–5.1)
Sodium: 141 mmol/L (ref 135–145)
Total Bilirubin: 0.4 mg/dL (ref 0.3–1.2)
Total Protein: 7.1 g/dL (ref 6.5–8.1)

## 2022-09-13 MED ORDER — PANTOPRAZOLE SODIUM 40 MG PO TBEC: 40.0000 mg | DELAYED_RELEASE_TABLET | Freq: Every day | ORAL | 3 refills | Status: DC

## 2022-09-13 MED ORDER — VENLAFAXINE HCL ER 37.5 MG PO CP24: 37.5000 mg | ORAL_CAPSULE | Freq: Every day | ORAL | 3 refills | Status: DC

## 2022-09-13 MED ORDER — TAMOXIFEN CITRATE 20 MG PO TABS: 20.0000 mg | ORAL_TABLET | Freq: Every day | ORAL | 6 refills | Status: DC

## 2022-09-13 NOTE — Progress Notes (Signed)
[facility]   [phone] Fax:([phone]) [phone]   Clinic Follow up Note   Patient Care Team: Pcp, No as PCP - General Mansouraty, Netty Starring., MD as Consulting Physician (Gastroenterology) Manus Rudd, MD as Consulting Physician (General Surgery) Malachy Mood, MD as Consulting Physician (Hematology) Dorothy Puffer, MD as Consulting Physician (Radiation Oncology) Contogiannis, Chales Abrahams, MD as Consulting Physician (Plastic Surgery) Pollyann Samples, NP as Nurse Practitioner (Nurse Practitioner) Tamela Oddi, MD (Plastic Surgery)  Date of Service:  09/13/2022  CHIEF COMPLAINT: f/u of  right breast cancer   CURRENT THERAPY:   Antiestrogen therapy started 11/2019, currently on tamoxifen   ASSESSMENT:  Caitlyn Miller is a 37 y.o. female with   Malignant neoplasm of upper-outer quadrant of right breast in female, estrogen receptor positive (HCC) Stage IIA, p(T2N1aM0), ER+/PR+/HER2-, Grade II, Mammaprint luminal type A, low risk -presented with large palpable mass. Breast MRI and PET scan on 11/25/19 were otherwise negative.  -Mammaprint showed low risk disease. -She started neoadjuvant tamoxifen on 12/01/19, tolerated poorly. Dose was reduced to 10mg  and she started Zoladex from 12/09/19, and switched to anastrozole 01/06/20 - 06/2020 (held for chemo). Zoladex discontinued 09/16/20.  -she took Verzenio on 01/12/20 - 03/25/20 but tolerated poorly. -initially underwent lumpectomy on 04/15/21 with Dr. Corliss Skains but was found to have a 3.1 cm tumor with positive margins and 2/3 positive lymph nodes. -s/p mastectomy on 05/18/20 showing: residual invasive carcinoma and DCIS, a positive margin, and another 2/12 positive nodes, indicating stage III disease.  -chest CT 06/17/20 and CT AP 07/02/20 were negative. -received one cycle AC on 06/24/20, discontinued due to very poor tolerance. -she completed 12 weeks Taxol 07/08/20 - 10/29/20. -s/p adjuvant RT 10/25/20 - 12/14/20 but developed severe  chest pain. -post-treatment PET on 01/06/21 was NED. -she restarted tamoxifen 20mg  in 12/2020 due to poor tolerance of anastrozole/Verzenio, held for month of 04/2021 for reconstruction. She is tolerating well with controlled hot flashes on effexor. --most recent left mammogram 06/09/21 was negative. -she had breast implant placement on right and exchange on left side on 11/18/2021 -she is clinically doing well, no concerns for recurrence.   -She tolerating tamoxifen well, will continue. -She is overdue for annual mammogram, she agrees to call breast center to schedule as soon as possible.  PLAN: - lab reviewed. - Continue Tamoxifen -Lab and follow-up in 6 months   SUMMARY OF ONCOLOGIC HISTORY: Oncology History Overview Note  Cancer Staging Malignant neoplasm of upper-outer quadrant of right breast in female, estrogen receptor positive (HCC) Staging form: Breast, AJCC 8th Edition - Clinical stage from 11/12/2019: Stage IIA (cT2, cN1, cM0, G2, ER+, PR+, HER2-) - Signed by Malachy Mood, MD on 11/19/2019 Stage prefix: Initial diagnosis Histologic grading system: 3 grade system - Pathologic stage from 04/15/2020: No Stage Recommended (ypT2, pN2a, cM0, G2, ER+, PR+, HER2-) - Signed by Malachy Mood, MD on 05/27/2020 Stage prefix: Post-therapy Histologic grading system: 3 grade system Residual tumor (R): R1 - Microscopic    Malignant neoplasm of upper-outer quadrant of right breast in female, estrogen receptor positive (HCC)  11/05/2019 Mammogram   IMPRESSION: Large irregular palpable mass 4.7 x 1.4 x 1.4 cm within the upper-outer right breast 11 o'clock position, 4 cm from nipple.  There is a large area of associated coarse heterogeneous calcifications within the mass. Overall findings are concerning for breast carcinoma.   Two cortically thickened right axillary lymph nodes which are indeterminate in etiology.   11/12/2019 Cancer Staging   Staging form: Breast,  AJCC 8th Edition - Clinical  stage from 11/12/2019: Stage IIA (cT2, cN1, cM0, G2, ER+, PR+, HER2-) - Signed by Malachy Mood, MD on 11/19/2019   11/12/2019 Initial Biopsy   Diagnosis 1. Breast, right, needle core biopsy, right - INVASIVE MAMMARY CARCINOMA, SEE COMMENT. - MAMMARY CARCINOMA IN SITU. 2. Lymph node, needle/core biopsy, right - METASTATIC MAMMARY CARCINOMA. Microscopic Comment 1. The carcinoma appears grade 2 and measures 16 mm in greatest linear extent. E-cadherin will be ordered. Prognostic makers will be ordered. Dr. Luisa Hart has reviewed the case. The case was called to [facility] on 01/13/2020.    1. E-cadherin is strongly positive consistent with a ductal phenotype.   11/12/2019 Receptors her2   1. PROGNOSTIC INDICATORS Results: IMMUNOHISTOCHEMICAL AND MORPHOMETRIC ANALYSIS PERFORMED MANUALLY The tumor cells are NEGATIVE for Her2 (0). Estrogen Receptor: 100%, POSITIVE, STRONG STAINING INTENSITY Progesterone Receptor: 100%, POSITIVE, STRONG STAINING INTENSITY Proliferation Marker Ki67: 5%   11/17/2019 Initial Diagnosis   Malignant neoplasm of upper-outer quadrant of right breast in female, estrogen receptor positive (HCC)   11/25/2019 Breast MRI   IMPRESSION: 1. Large area of non-mass enhancement involving the UPPER OUTER QUADRANT of the RIGHT breast measuring approximately 4.2 x 4.1 x 2.4 cm. The biopsy-proven IDC and DCIS is present at the superomedial aspect of this NME. 2. No MRI evidence of malignancy involving the LEFT breast. 3. Intact BILATERAL retropectoral implants. 4. 2 pathologically enlarged RIGHT axillary lymph nodes. One of these nodes is biopsy-proven metastatic disease. No pathologic lymphadenopathy elsewhere   11/25/2019 PET scan   IMPRESSION: 1. Two mildly enlarged hypermetabolic right axillary lymph nodes compatible with right axillary nodal metastases. 2. No additional sites of hypermetabolic metastatic disease. 3. Asymmetric indistinct upper right  breast hypermetabolism, without discrete mass correlate on the CT images, compatible with known primary right breast malignancy.     11/26/2019 Genetic Testing   Negative genetic testing: no pathogenic variants detected in [facility] STAT Breast Cancer Panel.  Variant of uncertain significance (VUS) detected in CHEK2 at c.1556G>T (p.Arg519Leu).  The report date is November 26, 2019.   The STAT Breast cancer panel offered by [facility] includes sequencing and rearrangement analysis for the following 9 genes:  ATM, BRCA1, BRCA2, CDH1, CHEK2, PALB2, PTEN, STK11 and TP53.    Results of [facility] Multi-Cancer Panel are pending.    12/01/2019 -  Anti-estrogen oral therapy   Neoadjuvant Tamoxifen 20mg  on 12/01/19. Reduced to 10mg  on 12/09/19.       ---Zoladex injection monthly starting 12/09/19.       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  Variants of uncertain significance detected RAD51D at c.715C>T (p.Arg239Trp) and CHEK2 at c.1556G>T (p.Arg519Leu).  The report date is December 08, 2019.   The Multi-Cancer Panel offered by [facility] includes sequencing and/or deletion duplication testing of the following 85 genes: AIP, ALK, APC, ATM, AXIN2,BAP1,  BARD1, BLM, BMPR1A, BRCA1, BRCA2, BRIP1, CASR, CDC73, CDH1, CDK4, CDKN1B, CDKN1C, CDKN2A (p14ARF), CDKN2A (p16INK4a), CEBPA, CHEK2, CTNNA1, DICER1, DIS3L2, EGFR (c.2369C>T, p.Thr790Met variant only), EPCAM (Deletion/duplication testing only), FH, FLCN, GATA2, GPC3, GREM1 (Promoter region deletion/duplication testing only), HOXB13 (c.251G>A, p.Gly84Glu), HRAS, KIT, MAX, MEN1, MET, MITF (c.952G>A, p.Glu318Lys variant only), MLH1, MSH2, MSH3, MSH6, MUTYH, NBN, NF1, NF2, NTHL1, PALB2, PDGFRA, PHOX2B, PMS2, POLD1, POLE, POT1, PRKAR1A, PTCH1, PTEN, RAD50, RAD51C, RAD51D, RB1, RECQL4, RET, RNF43, RUNX1, SDHAF2,  SDHA (sequence changes only), SDHB, SDHC,  SDHD, SMAD4, SMARCA4, SMARCB1, SMARCE1, STK11, SUFU, TERC, TERT, TMEM127, TP53, TSC1, TSC2, VHL, WRN and WT1.    12/09/2019 Miscellaneous   Mammaprint luminal type A, low risk  10% risk of recurrence in 10 years with 97.8% benefor of hormaonal therapy alone.    01/12/2020 - 03/25/2020 Chemotherapy   Verzenio started on 01/12/2020, dose reduced to 50mg  am and 100mg  01/27/2020 due to poor tolerance. Held Verzenio on 03/25/20 to proceed with breast surgery.    03/01/2020 Mammogram   Targeted ultrasound is performed, showing interval decreasing conspicuity of the patient's known right breast cancer. A post biopsy clip with an associated irregular area shadowing is demonstrated at the 11 o'clock position 4 cm from the nipple. Exact measurements are difficult due to the vague appearance of the findings. It measures approximately 1.2 x 0.9 x 0.6 cm (previously 4.7 x 2.2 x 1.5 cm).   IMPRESSION: Imaging findings consistent with good response to chemotherapy.   03/16/2020 Imaging   MRI Breast  IMPRESSION: 1. Interval resolution of RIGHT axillary adenopathy. 2. Slightly smaller area of non mass enhancement in the UPPER-OUTER QUADRANT of the RIGHT breast.   04/15/2020 Surgery   RIGHT BREAST LUMPECTOMY WITH RADIOACTIVE SEED LOCALIZATION and RADIOACTIVE SEED GUIDED RIGHT AXILLARY SENTINEL LYMPH NODE DISSECTION and SENTINEL NODE BIOPSY by Dr Corliss Skains    04/15/2020 Pathology Results   FINAL MICROSCOPIC DIAGNOSIS:   A. 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  - Invasive carcinoma broadly involves each the anterior, posterior,  superior and inferior margins.  - DCIS involves the inferior margin and is < 1 mm from each the anterior  and superior margins.  - Atypical lobular  hyperplasia.  - Biopsy sites.  - See oncology table.   B. TARGETED LYMPH NODE, RIGHT AXILLA, BIOPSY:  - Metastatic carcinoma in (1) of (1) lymph node.  - Biopsy site.   C. SENTINEL LYMPH NODE, RIGHT AXILLA #1, BIOPSY:  - Metastatic carcinoma in (1) of (1) lymph node.   D. SENTINEL LYMPH NODE, RIGHT AXILLA #2, BIOPSY:  - One lymph node, negative for carcinoma (0/1).    04/15/2020 Cancer Staging   Staging form: Breast, AJCC 8th Edition - Pathologic stage from 04/15/2020: No Stage Recommended (ypT2, pN2a, cM0, G2, ER+, PR+, HER2-) - Signed by Malachy Mood, MD on 05/27/2020 Stage prefix: Post-therapy Histologic grading system: 3 grade system Residual tumor (R): R1 - Microscopic   05/18/2020 Surgery   Right Mastectomy  A. BREAST, RIGHT, MASTECTOMY:  - Residual invasive carcinoma with mixed ductal and lobular features.       Residual invasive carcinoma broadly involves the anterior inferior  soft tissue margin.       Residual invasive carcinoma is focally < 1 mm from the deep margin.  - Residual ductal carcinoma in situ.       Residual DCIS is 3 mm from the closest margin, deep.  - Residual atypical lobular hyperplasia.  - Prior procedure site changes.  - Biopsy clip (X2).   B. BREAST IMPLANT, RIGHT, EXPLANTATION:  - Implant (gross only).   C. LYMPH NODES, RIGHT AXILLA, DISSECTION:  - Metastatic carcinoma in (2) of (12) lymph nodes.  - Prior procedure site changes in surrounding soft tissue.    06/24/2020 - 09/30/2020 Chemotherapy   Patient is on Treatment Plan : BREAST ADJUVANT DOSE DENSE AC q14d /  PACLitaxel q7d        INTERVAL HISTORY:  Caitlyn Miller is here for a follow up of  right breast cancer  She was last seen by NP Lacie on April 25, 2022.  She is clinically doing well, no new pain or other concerns.  Due to her international travel, she missed 1 months of tamoxifen because her insurance denied 3 months refill.  She is tolerating tamoxifen well, with mild hot flash.    All  other systems were reviewed with the patient and are negative.  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   Pelvic pain 09/2008   Personal history of chemotherapy    Personal history of radiation therapy     SURGICAL HISTORY: Past Surgical History:  Procedure Laterality Date   AUGMENTATION MAMMAPLASTY Bilateral 2020   silicone    BREAST IMPLANT REMOVAL Right 05/18/2020   Procedure: REMOVAL OF RIGHT BREAST IMPLANT;  Surgeon: Manus Rudd, MD;  Location: [facility];  Service: General;  Laterality: Right;   BREAST LUMPECTOMY WITH RADIOACTIVE SEED LOCALIZATION Right 04/15/2020   Procedure: RIGHT BREAST LUMPECTOMY WITH RADIOACTIVE SEED LOCALIZATION;  Surgeon: Manus Rudd, MD;  Location: [facility];  Service: General;  Laterality: Right;   MASTECTOMY     MODIFIED MASTECTOMY Right 05/18/2020   Procedure: RIGHT MODIFIED RADICAL MASTECTOMY;  Surgeon: Manus Rudd, MD;  Location: [facility];  Service: General;  Laterality: Right;   PORTACATH PLACEMENT Left 06/10/2020   Procedure: INSERTION PORT-A-CATH;  Surgeon: Manus Rudd, MD;  Location: [facility];  Service: General;  Laterality: Left;  45 MINUTES ROOM 2   RADIOACTIVE SEED GUIDED AXILLARY SENTINEL LYMPH NODE Right 04/15/2020   Procedure: RADIOACTIVE SEED GUIDED RIGHT AXILLARY SENTINEL LYMPH NODE DISSECTION;  Surgeon: Manus Rudd, MD;  Location:  [facility];  Service: General;  Laterality: Right;   SENTINEL NODE BIOPSY N/A 04/15/2020   Procedure: SENTINEL NODE BIOPSY;  Surgeon: Manus Rudd, MD;  Location: [facility];  Service: General;  Laterality: N/A;   WISDOM TOOTH EXTRACTION      I have reviewed the social history and family history with the patient and they are unchanged from previous note.  ALLERGIES:  is allergic to ibuprofen.  MEDICATIONS:  Current Outpatient Medications  Medication Sig Dispense Refill   venlafaxine XR (EFFEXOR-XR) 37.5 MG 24 hr capsule Take 1 capsule (37.5 mg total) by mouth daily with breakfast. 30 capsule 3   hydrocortisone 1 % lotion Apply 1 application topically daily as needed (rash). 118 mL 0   ondansetron (ZOFRAN ODT) 8 MG disintegrating tablet Take 1 tablet (8 mg total) by mouth every 8 (eight) hours as needed for nausea or vomiting. 30 tablet 0   pantoprazole (PROTONIX) 40 MG tablet Take 1 tablet (40 mg  total) by mouth daily. 30 tablet 3   prochlorperazine (COMPAZINE) 10 MG tablet TAKE 1 TABLET(10 MG) BY MOUTH EVERY 8 HOURS AS NEEDED FOR NAUSEA OR VOMITING 30 tablet 1   tamoxifen (NOLVADEX) 20 MG tablet Take 1 tablet (20 mg total) by mouth daily. 30 tablet 6   venlafaxine (EFFEXOR) 37.5 MG tablet TAKE 1 TABLET(37.5 MG) BY MOUTH DAILY 90 tablet 1   No current facility-administered medications for this visit.    PHYSICAL EXAMINATION: ECOG PERFORMANCE STATUS: 0 - Asymptomatic  Vitals:   09/13/22 1143  BP: 100/68  Pulse: 71  Resp: 16  Temp: 98.3 F (36.8 C)  SpO2: 100%   Wt Readings from Last 3 Encounters:  09/13/22 144 lb 3.2 oz (65.4 kg)  04/25/22 139 lb 9.6 oz (63.3 kg)  02/23/22 144 lb 9.6 oz (65.6 kg)     NECK: (-)supple, thyroid normal size, non-tender, without nodularity LYMPH:(-)   no palpable lymphadenopathy in the cervical, axillary  Breast: Status post right mastectomy with Implant reconstruction. Healed very well. No swelling , no bruising.  No palpable mass. Breast exam benign.  LABORATORY DATA:  I have reviewed the data as listed    Latest Ref Rng & Units 09/13/2022   10:26 AM 04/25/2022    8:24 AM 02/23/2022   10:42 AM  CBC  WBC 4.0 - 10.5 K/uL 7.1  6.1  6.7   Hemoglobin 12.0 - 15.0 g/dL 86.5  78.4  69.6   Hematocrit 36.0 - 46.0 % 33.4  34.6  32.9   Platelets 150 - 400 K/uL 236  236  299         Latest Ref Rng & Units 09/13/2022   10:26 AM 04/25/2022    8:24 AM 02/23/2022   10:42 AM  CMP  Glucose 70 - 99 mg/dL 87  295  77   BUN 6 - 20 mg/dL 11  13  10    Creatinine 0.44 - 1.00 mg/dL 2.84  1.32  4.40   Sodium 135 - 145 mmol/L 141  139  140   Potassium 3.5 - 5.1 mmol/L 4.2  4.0  3.8   Chloride 98 - 111 mmol/L 109  109  106   CO2 22 - 32 mmol/L 25  22  25    Calcium 8.9 - 10.3 mg/dL 9.3  9.3  9.2   Total Protein 6.5 - 8.1 g/dL 7.1  6.8  6.6   Total Bilirubin 0.3 - 1.2 mg/dL 0.4  0.3  0.4   Alkaline Phos 38 - 126 U/L 58  53  65   AST 15 - 41 U/L 40  25  27   ALT 0 - 44 U/L 64  30  42       RADIOGRAPHIC STUDIES: I have personally reviewed the radiological images as listed and agreed with the findings in the report. No results found.    No orders of the defined types were placed in this encounter.  All questions were answered. The patient knows to call the clinic with any problems, questions or concerns. No barriers to learning was detected. The total time spent in the appointment was 25 minutes.     Malachy Mood, MD 09/13/2022

## 2022-09-13 NOTE — Assessment & Plan Note (Signed)
Stage IIA, p(T2N1aM0), ER+/PR+/HER2-, Grade II, Mammaprint luminal type A, low risk -presented with large palpable mass. Breast MRI and PET scan on 11/25/19 were otherwise negative.  -Mammaprint showed low risk disease. -She started neoadjuvant tamoxifen on 12/01/19, tolerated poorly. Dose was reduced to '10mg'$  and she started Zoladex from 12/09/19, and switched to anastrozole 01/06/20 - 06/2020 (held for chemo). Zoladex discontinued 09/16/20.  -she took Verzenio on 01/12/20 - 03/25/20 but tolerated poorly. -initially underwent lumpectomy on 04/15/21 with Dr. Georgette Dover but was found to have a 3.1 cm tumor with positive margins and 2/3 positive lymph nodes. -s/p mastectomy on 05/18/20 showing: residual invasive carcinoma and DCIS, a positive margin, and another 2/12 positive nodes, indicating stage III disease.  -chest CT 06/17/20 and CT AP 07/02/20 were negative. -received one cycle AC on 06/24/20, discontinued due to very poor tolerance. -she completed 12 weeks Taxol 07/08/20 - 10/29/20. -s/p adjuvant RT 10/25/20 - 12/14/20 but developed severe chest pain. -post-treatment PET on 01/06/21 was NED. -she restarted tamoxifen '20mg'$  in 12/2020 due to poor tolerance of anastrozole/Verzenio, held for month of 04/2021 for reconstruction. She is tolerating well with controlled hot flashes on effexor. --most recent left mammogram 06/09/21 was negative. -she had breast implant placement on right and exchange on left side on 11/18/2021

## 2022-09-15 ENCOUNTER — Telehealth: Payer: Self-pay | Admitting: Hematology

## 2022-09-26 ENCOUNTER — Ambulatory Visit
Admission: RE | Admit: 2022-09-26 | Discharge: 2022-09-26 | Disposition: A | Discharge status: Home / Self Care | Payer: 59 | Source: Ambulatory Visit | Attending: Hematology | Admitting: Hematology

## 2022-09-26 DIAGNOSIS — Z1231 Encounter for screening mammogram for malignant neoplasm of breast: Secondary | ICD-10-CM

## 2022-10-05 ENCOUNTER — Other Ambulatory Visit: Payer: Self-pay

## 2022-10-06 ENCOUNTER — Other Ambulatory Visit: Payer: Self-pay

## 2022-10-06 DIAGNOSIS — C50411 Malignant neoplasm of upper-outer quadrant of right female breast: Secondary | ICD-10-CM

## 2022-10-09 ENCOUNTER — Other Ambulatory Visit: Payer: Self-pay | Admitting: Hematology

## 2022-10-09 DIAGNOSIS — N644 Mastodynia: Secondary | ICD-10-CM

## 2022-10-09 DIAGNOSIS — C50411 Malignant neoplasm of upper-outer quadrant of right female breast: Secondary | ICD-10-CM

## 2022-11-03 ENCOUNTER — Ambulatory Visit
Admission: RE | Admit: 2022-11-03 | Discharge: 2022-11-03 | Disposition: A | Discharge status: Home / Self Care | Payer: 59 | Source: Ambulatory Visit | Attending: Hematology | Admitting: Hematology

## 2022-11-03 ENCOUNTER — Ambulatory Visit
Admission: RE | Admit: 2022-11-03 | Discharge: 2022-11-03 | Disposition: A | Discharge status: Home / Self Care | Payer: 59 | Source: Ambulatory Visit | Attending: Hematology

## 2022-11-03 DIAGNOSIS — N644 Mastodynia: Secondary | ICD-10-CM

## 2022-11-03 DIAGNOSIS — C50411 Malignant neoplasm of upper-outer quadrant of right female breast: Secondary | ICD-10-CM

## 2023-03-13 NOTE — Assessment & Plan Note (Signed)
 Stage IIA, p(T2N1aM0), ER+/PR+/HER2-, Grade II, Mammaprint luminal type A, low risk -presented with large palpable mass. Breast MRI and PET scan on 11/25/19 were otherwise negative.  -Mammaprint showed low risk disease. -She started neoadjuvant tamoxifen  on 12/01/19, tolerated poorly. Dose was reduced to 10mg  and she started Zoladex  from 12/09/19, and switched to anastrozole  01/06/20 - 06/2020 (held for chemo). Zoladex  discontinued 09/16/20.  -she took Verzenio  on 01/12/20 - 03/25/20 but tolerated poorly. -initially underwent lumpectomy on 04/15/21 with Dr. Belinda but was found to have a 3.1 cm tumor with positive margins and 2/3 positive lymph nodes. -s/p mastectomy on 05/18/20 showing: residual invasive carcinoma and DCIS, a positive margin, and another 2/12 positive nodes, indicating stage III disease.  -chest CT 06/17/20 and CT AP 07/02/20 were negative. -received one cycle AC on 06/24/20, discontinued due to very poor tolerance. -she completed 12 weeks Taxol  07/08/20 - 10/29/20. -s/p adjuvant RT 10/25/20 - 12/14/20 but developed severe chest pain. -post-treatment PET on 01/06/21 was NED. -she restarted tamoxifen  20mg  in 12/2020 due to poor tolerance of anastrozole /Verzenio , held for month of 04/2021 for reconstruction. She is tolerating well with controlled hot flashes on effexor . --most recent left mammogram 06/09/21 was negative. -she had breast implant placement on right and exchange on left side on 11/18/2021

## 2023-03-14 ENCOUNTER — Inpatient Hospital Stay: Payer: 59 | Attending: Hematology

## 2023-03-14 ENCOUNTER — Inpatient Hospital Stay: Payer: 59 | Admitting: Hematology

## 2023-03-14 ENCOUNTER — Other Ambulatory Visit: Payer: Self-pay

## 2023-03-14 ENCOUNTER — Encounter: Payer: Self-pay | Admitting: Hematology

## 2023-03-14 VITALS — BP 111/82 | HR 78 | Temp 98.0°F | Resp 17 | Wt 146.1 lb

## 2023-03-14 DIAGNOSIS — N644 Mastodynia: Secondary | ICD-10-CM | POA: Diagnosis not present

## 2023-03-14 DIAGNOSIS — C50411 Malignant neoplasm of upper-outer quadrant of right female breast: Secondary | ICD-10-CM | POA: Insufficient documentation

## 2023-03-14 DIAGNOSIS — Z1732 Human epidermal growth factor receptor 2 negative status: Secondary | ICD-10-CM | POA: Diagnosis not present

## 2023-03-14 DIAGNOSIS — Z79810 Long term (current) use of selective estrogen receptor modulators (SERMs): Secondary | ICD-10-CM | POA: Diagnosis not present

## 2023-03-14 DIAGNOSIS — R102 Pelvic and perineal pain: Secondary | ICD-10-CM | POA: Diagnosis not present

## 2023-03-14 DIAGNOSIS — R110 Nausea: Secondary | ICD-10-CM | POA: Insufficient documentation

## 2023-03-14 DIAGNOSIS — Z170 Estrogen receptor positive status [ER+]: Secondary | ICD-10-CM

## 2023-03-14 LAB — CMP (CANCER CENTER ONLY)
ALT: 107 U/L — ABNORMAL HIGH (ref 0–44)
AST: 50 U/L — ABNORMAL HIGH (ref 15–41)
Albumin: 4.3 g/dL (ref 3.5–5.0)
Alkaline Phosphatase: 58 U/L (ref 38–126)
Anion gap: 8 (ref 5–15)
BUN: 10 mg/dL (ref 6–20)
CO2: 25 mmol/L (ref 22–32)
Calcium: 9.4 mg/dL (ref 8.9–10.3)
Chloride: 108 mmol/L (ref 98–111)
Creatinine: 0.59 mg/dL (ref 0.44–1.00)
GFR, Estimated: >60 mL/min (ref >60)
Glucose, Bld: 88 mg/dL (ref 70–99)
Potassium: 4 mmol/L (ref 3.5–5.1)
Sodium: 141 mmol/L (ref 135–145)
Total Bilirubin: 0.5 mg/dL (ref 0.0–1.2)
Total Protein: 7 g/dL (ref 6.5–8.1)

## 2023-03-14 LAB — CBC WITH DIFFERENTIAL (CANCER CENTER ONLY)
Abs Immature Granulocytes: 0.01 10*3/uL (ref 0.00–0.07)
Basophils Absolute: 0 10*3/uL (ref 0.0–0.1)
Basophils Relative: 1 %
Eosinophils Absolute: 0.1 10*3/uL (ref 0.0–0.5)
Eosinophils Relative: 1 %
HCT: 36.2 % (ref 36.0–46.0)
Hemoglobin: 11.8 g/dL — ABNORMAL LOW (ref 12.0–15.0)
Immature Granulocytes: 0 %
Lymphocytes Relative: 40 %
Lymphs Abs: 2.3 10*3/uL (ref 0.7–4.0)
MCH: 30.6 pg (ref 26.0–34.0)
MCHC: 32.6 g/dL (ref 30.0–36.0)
MCV: 93.8 fL (ref 80.0–100.0)
Monocytes Absolute: 0.5 10*3/uL (ref 0.1–1.0)
Monocytes Relative: 8 %
Neutro Abs: 2.8 10*3/uL (ref 1.7–7.7)
Neutrophils Relative %: 50 %
Platelet Count: 209 10*3/uL (ref 150–400)
RBC: 3.86 MIL/uL — ABNORMAL LOW (ref 3.87–5.11)
RDW: 12.6 % (ref 11.5–15.5)
WBC Count: 5.7 10*3/uL (ref 4.0–10.5)
nRBC: 0 % (ref 0.0–0.2)

## 2023-03-14 MED ORDER — TAMOXIFEN CITRATE 20 MG PO TABS: 20.0000 mg | ORAL_TABLET | Freq: Every day | ORAL | 3 refills | Status: AC

## 2023-03-14 MED ORDER — ONDANSETRON 8 MG PO TBDP: 8.0000 mg | ORAL_TABLET | Freq: Three times a day (TID) | ORAL | 0 refills | Status: DC | PRN

## 2023-03-14 MED ORDER — VENLAFAXINE HCL 25 MG PO TABS: 25.0000 mg | ORAL_TABLET | Freq: Two times a day (BID) | ORAL | 0 refills | Status: DC

## 2023-03-14 NOTE — Progress Notes (Signed)
 [facility]   [phone] [phone]   Clinic Follow up Note   Patient Care Team: Pcp, No as PCP - General Mansouraty, Netty Starring., MD as Consulting Physician (Gastroenterology) Manus Rudd, MD as Consulting Physician (General Surgery) Malachy Mood, MD as Consulting Physician (Hematology) Dorothy Puffer, MD as Consulting Physician (Radiation Oncology) Contogiannis, Chales Abrahams, MD as Consulting Physician (Plastic Surgery) Pollyann Samples, NP as Nurse Practitioner (Nurse Practitioner) Tamela Oddi, MD (Plastic Surgery)  Date of Service:  03/14/2023  CHIEF COMPLAINT: f/u of breast cancer  CURRENT THERAPY:  Adjuvant tamoxifen 20 mg daily  Oncology History   Malignant neoplasm of upper-outer quadrant of right breast in female, estrogen receptor positive (HCC) Stage IIA, p(T2N1aM0), ER+/PR+/HER2-, Grade II, Mammaprint luminal type A, low risk -presented with large palpable mass. Breast MRI and PET scan on 11/25/19 were otherwise negative.  -Mammaprint showed low risk disease. -She started neoadjuvant tamoxifen on 12/01/19, tolerated poorly. Dose was reduced to 10mg  and she started Zoladex from 12/09/19, and switched to anastrozole 01/06/20 - 06/2020 (held for chemo). Zoladex discontinued 09/16/20.  -she took Verzenio on 01/12/20 - 03/25/20 but tolerated poorly. -initially underwent lumpectomy on 04/15/21 with Dr. Corliss Skains but was found to have a 3.1 cm tumor with positive margins and 2/3 positive lymph nodes. -s/p mastectomy on 05/18/20 showing: residual invasive carcinoma and DCIS, a positive margin, and another 2/12 positive nodes, indicating stage III disease.  -chest CT 06/17/20 and CT AP 07/02/20 were negative. -received one cycle AC on 06/24/20, discontinued due to very poor tolerance. -she completed 12 weeks Taxol 07/08/20 - 10/29/20. -s/p adjuvant RT 10/25/20 - 12/14/20 but developed severe chest pain. -post-treatment PET on 01/06/21 was NED. -she restarted  tamoxifen 20mg  in 12/2020 due to poor tolerance of anastrozole/Verzenio, held for month of 04/2021 for reconstruction. She is tolerating well with controlled hot flashes on effexor. --most recent left mammogram 06/09/21 was negative. -she had breast implant placement on right and exchange on left side on 11/18/2021   Assessment and Plan    Breast cancer follow-up Breast cancer with previous right mastectomy. Currently on tamoxifen therapy. Last mammogram in October showed negative results with dense breast tissue. Awaiting upcoming surgery on March 6th. Discussed potential for contrast-enhanced mammogram to improve imaging quality. - Order contrast-enhanced mammogram for future follow-up - Refill tamoxifen prescription for three months - Schedule follow-up in six months unless new issues arise  Pelvic pain Intermittent stabbing pain in the ovaries for almost a month, occurring daily, disrupting sleep, and increasing in frequency. No menstrual irregularities. Differential diagnosis includes ovarian pathology or tamoxifen side effects. CT scan preferred over transvaginal ultrasound due to breast cancer history. - Order CT scan of abdomen and pelvis to evaluate ovarian pain - Perform blood tests for early cancer recurrence - Advise to contact gynecologist if CT scan is inconclusive  Nausea  Currently on Zofran for nausea management. Prefers capsule form for once-daily dosing. Discussed tapering plan for temazepam due to mood swings. - Prescribe Zofran capsule for nausea management - Provide tapering instructions for temazepam - Refill Zofran prescription     Plan -Will order CT abdomen and pelvis with contrast to evaluate her pelvic pain, she will also call her GYN for follow-up. -She will proceed scalp surgery next week -Continue tamoxifen -First Signatera in next few weeks when she comes in for CT scan.  Plan to repeat every 6 months. -Lab and follow-up in 6 months.    SUMMARY OF  ONCOLOGIC HISTORY: Oncology History  Overview Note  Cancer Staging Malignant neoplasm of upper-outer quadrant of right breast in female, estrogen receptor positive (HCC) Staging form: Breast, AJCC 8th Edition - Clinical stage from 11/12/2019: Stage IIA (cT2, cN1, cM0, G2, ER+, PR+, HER2-) - Signed by Malachy Mood, MD on 11/19/2019 Stage prefix: Initial diagnosis Histologic grading system: 3 grade system - Pathologic stage from 04/15/2020: No Stage Recommended (ypT2, pN2a, cM0, G2, ER+, PR+, HER2-) - Signed by Malachy Mood, MD on 05/27/2020 Stage prefix: Post-therapy Histologic grading system: 3 grade system Residual tumor (R): R1 - Microscopic    Malignant neoplasm of upper-outer quadrant of right breast in female, estrogen receptor positive (HCC)  11/05/2019 Mammogram   IMPRESSION: Large irregular palpable mass 4.7 x 1.4 x 1.4 cm within the upper-outer right breast 11 o'clock position, 4 cm from nipple.  There is a large area of associated coarse heterogeneous calcifications within the mass. Overall findings are concerning for breast carcinoma.   Two cortically thickened right axillary lymph nodes which are indeterminate in etiology.   11/12/2019 Cancer Staging   Staging form: Breast, AJCC 8th Edition - Clinical stage from 11/12/2019: Stage IIA (cT2, cN1, cM0, G2, ER+, PR+, HER2-) - Signed by Malachy Mood, MD on 11/19/2019   11/12/2019 Initial Biopsy   Diagnosis 1. Breast, right, needle core biopsy, right - INVASIVE MAMMARY CARCINOMA, SEE COMMENT. - MAMMARY CARCINOMA IN SITU. 2. Lymph node, needle/core biopsy, right - METASTATIC MAMMARY CARCINOMA. Microscopic Comment 1. The carcinoma appears grade 2 and measures 16 mm in greatest linear extent. E-cadherin will be ordered. Prognostic makers will be ordered. Dr. Luisa Hart has reviewed the case. The case was called to [facility] on 01/13/2020.    1. E-cadherin is strongly positive consistent with a ductal phenotype.    11/12/2019 Receptors her2   1. PROGNOSTIC INDICATORS Results: IMMUNOHISTOCHEMICAL AND MORPHOMETRIC ANALYSIS PERFORMED MANUALLY The tumor cells are NEGATIVE for Her2 (0). Estrogen Receptor: 100%, POSITIVE, STRONG STAINING INTENSITY Progesterone Receptor: 100%, POSITIVE, STRONG STAINING INTENSITY Proliferation Marker Ki67: 5%   11/17/2019 Initial Diagnosis   Malignant neoplasm of upper-outer quadrant of right breast in female, estrogen receptor positive (HCC)   11/25/2019 Breast MRI   IMPRESSION: 1. Large area of non-mass enhancement involving the UPPER OUTER QUADRANT of the RIGHT breast measuring approximately 4.2 x 4.1 x 2.4 cm. The biopsy-proven IDC and DCIS is present at the superomedial aspect of this NME. 2. No MRI evidence of malignancy involving the LEFT breast. 3. Intact BILATERAL retropectoral implants. 4. 2 pathologically enlarged RIGHT axillary lymph nodes. One of these nodes is biopsy-proven metastatic disease. No pathologic lymphadenopathy elsewhere   11/25/2019 PET scan   IMPRESSION: 1. Two mildly enlarged hypermetabolic right axillary lymph nodes compatible with right axillary nodal metastases. 2. No additional sites of hypermetabolic metastatic disease. 3. Asymmetric indistinct upper right breast hypermetabolism, without discrete mass correlate on the CT images, compatible with known primary right breast malignancy.     11/26/2019 Genetic Testing   Negative genetic testing: no pathogenic variants detected in [facility] STAT Breast Cancer Panel.  Variant of uncertain significance (VUS) detected in CHEK2 at c.1556G>T (p.Arg519Leu).  The report date is November 26, 2019.   The STAT Breast cancer panel offered by [facility] includes sequencing and rearrangement analysis for the following 9 genes:  ATM, BRCA1, BRCA2, CDH1, CHEK2, PALB2, PTEN, STK11 and TP53.    Results of [facility] Multi-Cancer Panel are pending.    12/01/2019 -  Anti-estrogen oral therapy   Neoadjuvant  Tamoxifen 20mg  on 12/01/19.  Reduced to 10mg  on 12/09/19.       ---Zoladex injection monthly starting 12/09/19.       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  Variants of uncertain significance detected RAD51D at c.715C>T (p.Arg239Trp) and CHEK2 at c.1556G>T (p.Arg519Leu).  The report date is December 08, 2019.   The Multi-Cancer Panel offered by [facility] includes sequencing and/or deletion duplication testing of the following 85 genes: AIP, ALK, APC, ATM, AXIN2,BAP1,  BARD1, BLM, BMPR1A, BRCA1, BRCA2, BRIP1, CASR, CDC73, CDH1, CDK4, CDKN1B, CDKN1C, CDKN2A (p14ARF), CDKN2A (p16INK4a), CEBPA, CHEK2, CTNNA1, DICER1, DIS3L2, EGFR (c.2369C>T, p.Thr790Met variant only), EPCAM (Deletion/duplication testing only), FH, FLCN, GATA2, GPC3, GREM1 (Promoter region deletion/duplication testing only), HOXB13 (c.251G>A, p.Gly84Glu), HRAS, KIT, MAX, MEN1, MET, MITF (c.952G>A, p.Glu318Lys variant only), MLH1, MSH2, MSH3, MSH6, MUTYH, NBN, NF1, NF2, NTHL1, PALB2, PDGFRA, PHOX2B, PMS2, POLD1, POLE, POT1, PRKAR1A, PTCH1, PTEN, RAD50, RAD51C, RAD51D, RB1, RECQL4, RET, RNF43, RUNX1, SDHAF2, SDHA (sequence changes only), SDHB, SDHC, SDHD, SMAD4, SMARCA4, SMARCB1, SMARCE1, STK11, SUFU, TERC, TERT, TMEM127, TP53, TSC1, TSC2, VHL, WRN and WT1.    12/09/2019 Miscellaneous   Mammaprint luminal type A, low risk  10% risk of recurrence in 10 years with 97.8% benefor of hormaonal therapy alone.    01/12/2020 - 03/25/2020 Chemotherapy   Verzenio started on 01/12/2020, dose reduced to 50mg  am and 100mg  01/27/2020 due to poor tolerance. Held Verzenio on 03/25/20 to proceed with breast surgery.    03/01/2020 Mammogram   Targeted ultrasound is performed, showing interval decreasing conspicuity of the patient's known right breast cancer. A post biopsy clip with an associated  irregular area shadowing is demonstrated at the 11 o'clock position 4 cm from the nipple. Exact measurements are difficult due to the vague appearance of the findings. It measures approximately 1.2 x 0.9 x 0.6 cm (previously 4.7 x 2.2 x 1.5 cm).   IMPRESSION: Imaging findings consistent with good response to chemotherapy.   03/16/2020 Imaging   MRI Breast  IMPRESSION: 1. Interval resolution of RIGHT axillary adenopathy. 2. Slightly smaller area of non mass enhancement in the UPPER-OUTER QUADRANT of the RIGHT breast.   04/15/2020 Surgery   RIGHT BREAST LUMPECTOMY WITH RADIOACTIVE SEED LOCALIZATION and RADIOACTIVE SEED GUIDED RIGHT AXILLARY SENTINEL LYMPH NODE DISSECTION and SENTINEL NODE BIOPSY by Dr Corliss Skains    04/15/2020 Pathology Results   FINAL MICROSCOPIC DIAGNOSIS:   A. 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  - Invasive carcinoma broadly involves each the anterior, posterior,  superior and inferior margins.  - DCIS involves the inferior margin and is < 1 mm from each the anterior  and superior margins.  - Atypical lobular hyperplasia.  - Biopsy sites.  - See oncology table.   B. TARGETED LYMPH NODE, RIGHT AXILLA, BIOPSY:  - Metastatic carcinoma in (1) of (1) lymph node.  - Biopsy site.   C. SENTINEL LYMPH NODE, RIGHT AXILLA #1, BIOPSY:  - Metastatic carcinoma in (1) of (1) lymph node.   D. SENTINEL LYMPH NODE, RIGHT AXILLA #2, BIOPSY:  - One lymph node, negative for carcinoma (0/1).    04/15/2020 Cancer Staging   Staging form: Breast, AJCC 8th Edition - Pathologic stage from 04/15/2020: No Stage Recommended (ypT2, pN2a, cM0, G2, ER+, PR+, HER2-) -  Signed by Malachy Mood, MD on 05/27/2020 Stage prefix: Post-therapy Histologic grading system: 3 grade system Residual tumor (R): R1 - Microscopic    05/18/2020 Surgery   Right Mastectomy  A. BREAST, RIGHT, MASTECTOMY:  - Residual invasive carcinoma with mixed ductal and lobular features.       Residual invasive carcinoma broadly involves the anterior inferior  soft tissue margin.       Residual invasive carcinoma is focally < 1 mm from the deep margin.  - Residual ductal carcinoma in situ.       Residual DCIS is 3 mm from the closest margin, deep.  - Residual atypical lobular hyperplasia.  - Prior procedure site changes.  - Biopsy clip (X2).   B. BREAST IMPLANT, RIGHT, EXPLANTATION:  - Implant (gross only).   C. LYMPH NODES, RIGHT AXILLA, DISSECTION:  - Metastatic carcinoma in (2) of (12) lymph nodes.  - Prior procedure site changes in surrounding soft tissue.    06/24/2020 - 09/30/2020 Chemotherapy   Patient is on Treatment Plan : BREAST ADJUVANT DOSE DENSE AC q14d / PACLitaxel q7d        Discussed the use of AI scribe software for clinical note transcription with the patient, who gave verbal consent to proceed.  History of Present Illness   A 38 year old female with a history of breast cancer presents with multiple concerns. She reports a possible vitamin D deficiency, which has resulted in hair loss. She is not currently taking any biotin supplements.  The patient also complains of constant pain in her ovaries, which has been occurring for about a month. The pain is described as a stabbing sensation that comes and goes, but has been more frequent recently. The pain is present every day and lasts until the patient falls asleep. The patient also reports frequent urination, up to four or five times a night.  In addition to the ovarian pain, the patient experiences pain in her left breast. This pain has been present for about three months and is described as a deep, internal sensation. The patient had a mastectomy on the right side and a breast reduction on the left side.  The patient is currently taking tamoxifen and reports no  hot flashes. She also mentions that she is constipated. The patient is considering weaning off her tamoxifen medication due to mood swings.         All other systems were reviewed with the patient and are negative.  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   Pelvic pain 09/2008   Personal history of chemotherapy    Personal history of radiation therapy     SURGICAL HISTORY: Past Surgical History:  Procedure Laterality Date   AUGMENTATION MAMMAPLASTY Bilateral 2020   silicone    BREAST IMPLANT REMOVAL Right 05/18/2020  Procedure: REMOVAL OF RIGHT BREAST IMPLANT;  Surgeon: Manus Rudd, MD;  Location: [facility];  Service: General;  Laterality: Right;   BREAST LUMPECTOMY WITH RADIOACTIVE SEED LOCALIZATION Right 04/15/2020   Procedure: RIGHT BREAST LUMPECTOMY WITH RADIOACTIVE SEED LOCALIZATION;  Surgeon: Manus Rudd, MD;  Location: [facility];  Service: General;  Laterality: Right;   MASTECTOMY     MODIFIED MASTECTOMY Right 05/18/2020   Procedure: RIGHT MODIFIED RADICAL MASTECTOMY;  Surgeon: Manus Rudd, MD;  Location: [facility];  Service: General;  Laterality: Right;   PORTACATH PLACEMENT Left 06/10/2020   Procedure: INSERTION PORT-A-CATH;  Surgeon: Manus Rudd, MD;  Location: [facility];  Service: General;  Laterality: Left;  45 MINUTES ROOM 2   RADIOACTIVE SEED GUIDED AXILLARY SENTINEL  LYMPH NODE Right 04/15/2020   Procedure: RADIOACTIVE SEED GUIDED RIGHT AXILLARY SENTINEL LYMPH NODE DISSECTION;  Surgeon: Manus Rudd, MD;  Location: [facility];  Service: General;  Laterality: Right;   SENTINEL NODE BIOPSY N/A 04/15/2020   Procedure: SENTINEL NODE BIOPSY;  Surgeon: Manus Rudd, MD;  Location: [facility];  Service: General;  Laterality: N/A;   WISDOM TOOTH EXTRACTION      I have reviewed the social history and family history with the patient and they are unchanged from previous note.  ALLERGIES:  is allergic to ibuprofen.  MEDICATIONS:  Current Outpatient Medications  Medication Sig Dispense Refill   venlafaxine (EFFEXOR) 25 MG tablet Take 1 tablet (25 mg total) by mouth 2 (two) times daily. Ok to wean off in 4-8 weeks. In 2 weeks, decrease to 1 tab daily for 4 weeks, then 1 tab every other day for 2 weeks then stop 90 tablet 0   hydrocortisone 1 % lotion Apply 1 application topically daily as needed (rash). 118 mL 0   ondansetron (ZOFRAN ODT) 8 MG disintegrating tablet Take 1 tablet (8 mg total) by mouth every 8 (eight) hours as needed for nausea or vomiting. 30 tablet 0   pantoprazole (PROTONIX) 40 MG tablet Take 1 tablet (40 mg total) by mouth daily. 30 tablet 3   tamoxifen (NOLVADEX) 20 MG tablet Take 1 tablet (20 mg total) by mouth daily. 90 tablet 3   No current facility-administered medications for this visit.    PHYSICAL EXAMINATION: ECOG PERFORMANCE STATUS: 1 - Symptomatic but completely ambulatory  Vitals:   03/14/23 1051  BP: 111/82  Pulse: 78  Resp: 17  Temp: 98 F (36.7 C)  SpO2: 99%   Wt Readings from Last 3 Encounters:  03/14/23 146 lb 1.6 oz (66.3 kg)  09/13/22 144 lb 3.2 oz (65.4 kg)  04/25/22 139 lb 9.6 oz (63.3 kg)     GENERAL:alert, no distress and comfortable SKIN: skin color, texture, turgor are normal, no rashes or significant lesions EYES: normal, Conjunctiva are pink and non-injected, sclera  clear NECK: supple, thyroid normal size, non-tender, without nodularity LYMPH:  no palpable lymphadenopathy in the cervical, axillary  LUNGS: clear to auscultation and percussion with normal breathing effort HEART: regular rate & rhythm and no murmurs and no lower extremity edema ABDOMEN:abdomen soft, non-tender and normal bowel sounds, moderate tenderness in pelvic area. Musculoskeletal:no cyanosis of digits and no clubbing  NEURO: alert & oriented x 3 with fluent speech, no focal motor/sensory deficits Breast: Status post right mastectomy with implant, no palpable mass.  Left breast exam was benign.  No axillary adenopathy.  LABORATORY DATA:  I have reviewed the data as listed    Latest Ref Rng &  Units 03/14/2023   10:26 AM 09/13/2022   10:26 AM 04/25/2022    8:24 AM  CBC  WBC 4.0 - 10.5 K/uL 5.7  7.1  6.1   Hemoglobin 12.0 - 15.0 g/dL 57.8  46.9  62.9   Hematocrit 36.0 - 46.0 % 36.2  33.4  34.6   Platelets 150 - 400 K/uL 209  236  236         Latest Ref Rng & Units 03/14/2023   10:26 AM 09/13/2022   10:26 AM 04/25/2022    8:24 AM  CMP  Glucose 70 - 99 mg/dL 88  87  528   BUN 6 - 20 mg/dL 10  11  13    Creatinine 0.44 - 1.00 mg/dL 4.13  2.44  0.10   Sodium 135 - 145 mmol/L 141  141  139   Potassium 3.5 - 5.1 mmol/L 4.0  4.2  4.0   Chloride 98 - 111 mmol/L 108  109  109   CO2 22 - 32 mmol/L 25  25  22    Calcium 8.9 - 10.3 mg/dL 9.4  9.3  9.3   Total Protein 6.5 - 8.1 g/dL 7.0  7.1  6.8   Total Bilirubin 0.0 - 1.2 mg/dL 0.5  0.4  0.3   Alkaline Phos 38 - 126 U/L 58  58  53   AST 15 - 41 U/L 50  40  25   ALT 0 - 44 U/L 107  64  30       RADIOGRAPHIC STUDIES: I have personally reviewed the radiological images as listed and agreed with the findings in the report. No results found.    Orders Placed This Encounter  Procedures   CT ABDOMEN PELVIS W CONTRAST    Standing Status:   Future    Expected Date:   03/28/2023    Expiration Date:   03/13/2024    If indicated for the  ordered procedure, I authorize the administration of contrast media per Radiology protocol:   Yes    Does the patient have a contrast media/X-ray dye allergy?:   No    Is patient pregnant?:   No    Preferred imaging location?:   [facility]    If indicated for the ordered procedure, I authorize the administration of oral contrast media per Radiology protocol:   Yes   All questions were answered. The patient knows to call the clinic with any problems, questions or concerns. No barriers to learning was detected. The total time spent in the appointment was 30 minutes.     Malachy Mood, MD 03/14/2023

## 2023-03-14 NOTE — Progress Notes (Signed)
 Verbal order from Dr. Mosetta Putt w/readback for Signatera to be drawn around pt's CT Scan date.  Since pt's CT Scan is scheduled on 03/27/2023 at 0815 Signatera lab appt scheduled on 03/26/2023.  Signatera ordered on case#[id number] and molecular sample will be drawn on 03/26/2023.  Signatera ordered placed online and receipt of order confirmed.

## 2023-03-15 ENCOUNTER — Telehealth: Payer: Self-pay | Admitting: Nurse Practitioner

## 2023-03-15 NOTE — Telephone Encounter (Signed)
 Left patient a voicemail in regards to scheduled appointment times/dates; left callback number for patient if needing to cancel or reschedule appointment

## 2023-03-22 ENCOUNTER — Other Ambulatory Visit: Payer: Self-pay | Admitting: Plastic Surgery

## 2023-03-26 ENCOUNTER — Inpatient Hospital Stay: Payer: 59 | Attending: Hematology

## 2023-03-26 DIAGNOSIS — C50411 Malignant neoplasm of upper-outer quadrant of right female breast: Secondary | ICD-10-CM

## 2023-03-26 LAB — GENETIC SCREENING ORDER

## 2023-03-26 LAB — DERMATOLOGY PATHOLOGY

## 2023-03-27 ENCOUNTER — Encounter (HOSPITAL_COMMUNITY): Payer: Self-pay

## 2023-03-27 ENCOUNTER — Ambulatory Visit (HOSPITAL_COMMUNITY)
Admission: RE | Admit: 2023-03-27 | Discharge: 2023-03-27 | Disposition: A | Discharge status: Home / Self Care | Payer: 59 | Source: Ambulatory Visit | Attending: Hematology | Admitting: Hematology

## 2023-03-27 ENCOUNTER — Encounter: Payer: Self-pay | Admitting: Hematology

## 2023-03-27 DIAGNOSIS — C50411 Malignant neoplasm of upper-outer quadrant of right female breast: Secondary | ICD-10-CM | POA: Insufficient documentation

## 2023-03-27 DIAGNOSIS — Z170 Estrogen receptor positive status [ER+]: Secondary | ICD-10-CM | POA: Diagnosis present

## 2023-03-27 MED ORDER — IOHEXOL 300 MG/ML  SOLN
100.0000 mL | Freq: Once | INTRAMUSCULAR | Status: AC | PRN
  Administered 2023-03-27: 100 mL via INTRAVENOUS

## 2023-04-06 ENCOUNTER — Telehealth: Payer: Self-pay

## 2023-04-06 NOTE — Telephone Encounter (Signed)
 Pt's spouse advised with VU and will relay results and recommendations to pt.

## 2023-04-20 LAB — SIGNATERA
SIGNATERA MTM READOUT: 0 MTM/ml
SIGNATERA TEST RESULT: NEGATIVE

## 2023-04-24 ENCOUNTER — Telehealth: Payer: Self-pay

## 2023-04-24 ENCOUNTER — Encounter: Payer: Self-pay | Admitting: Hematology

## 2023-04-24 ENCOUNTER — Other Ambulatory Visit: Payer: Self-pay

## 2023-04-24 NOTE — Telephone Encounter (Signed)
 Attempted to contact the patient via telephone call.  Unable to reach the patient or spouse.  Left voice message stating her Signatera result was negative and to call our office if she had any additional questions or concerns.

## 2023-09-11 ENCOUNTER — Other Ambulatory Visit: Payer: Self-pay | Admitting: Nurse Practitioner

## 2023-09-11 DIAGNOSIS — Z170 Estrogen receptor positive status [ER+]: Secondary | ICD-10-CM

## 2023-09-11 NOTE — Progress Notes (Signed)
 Patient Care Team: Pcp, No as PCP - General Mansouraty, Aloha Raddle., MD as Consulting Physician (Gastroenterology) Belinda Cough, MD as Consulting Physician (General Surgery) Lanny Callander, MD as Consulting Physician (Hematology) Dewey Rush, MD as Consulting Physician (Radiation Oncology) Contogiannis, Ronal Caldron, MD as Consulting Physician (Plastic Surgery) Burton, Lacie K, NP as Nurse Practitioner (Nurse Practitioner) Margarito James, MD (Plastic Surgery)  Clinic Day:  09/12/2023  Referring physician: No ref. provider found  ASSESSMENT & PLAN:   Assessment & Plan: Malignant neoplasm of upper-outer quadrant of right breast in female, estrogen receptor positive (HCC) Stage IIA, p(T2N1aM0), ER+/PR+/HER2-, Grade II, Mammaprint luminal type A, low risk -presented with large palpable mass. Breast MRI and PET scan on 11/25/19 were otherwise negative.  -Mammaprint showed low risk disease. -She started neoadjuvant tamoxifen  on 12/01/19, tolerated poorly. Dose was reduced to 10mg  and she started Zoladex  from 12/09/19, and switched to anastrozole  01/06/20 - 06/2020 (held for chemo). Zoladex  discontinued 09/16/20.  -she took Verzenio  on 01/12/20 - 03/25/20 but tolerated poorly. -initially underwent lumpectomy on 04/15/21 with Dr. Belinda but was found to have a 3.1 cm tumor with positive margins and 2/3 positive lymph nodes. -s/p mastectomy on 05/18/20 showing: residual invasive carcinoma and DCIS, a positive margin, and another 2/12 positive nodes, indicating stage III disease.  -chest CT 06/17/20 and CT AP 07/02/20 were negative. -received one cycle AC on 06/24/20, discontinued due to very poor tolerance. -she completed 12 weeks Taxol  07/08/20 - 10/29/20. -s/p adjuvant RT 10/25/20 - 12/14/20 but developed severe chest pain. -post-treatment PET on 01/06/21 was NED. -she restarted tamoxifen  20mg  in 12/2020 due to poor tolerance of anastrozole /Verzenio , held for month of 04/2021 for reconstruction. She is tolerating  well with controlled hot flashes on effexor . --most recent left mammogram 06/09/21 was negative. -she had breast implant placement on right and exchange on left side on 11/18/2021 - CT abdomen pelvis in March 2025 showed no acute abnormality and no evidence of metastatic disease.  There is a 3.3 cm left ovarian cyst consistent with corpus luteum. - Initial Signatera done 04/2023 with negative results.  Repeat Signatera to be done in 10/2023.  New left breast ultrasound ordered to further evaluate palpable nodule.  Continue tamoxifen  daily. Plan for labs and follow up in 6 months, sooner if needed.    Breast tenderness Patient has left breast tenderness along with palpable nodule over surgical incision site.  Will order ultrasound for further evaluation as patient prefers not to have screening or diagnostic mammogram for fear this will lead to additional cancer diagnoses.  Advised her it is likely that radiology department to be changed to diagnostic mammogram prior to today's ultrasound.  She voiced understanding.  Plan: Labs reviewed.  -CBC and CMP unremarkable.  -ultrasound of left breast nodule  ordered for further evaluation.  --patient understands this will likely be changed to diagnostic mammogram prior to breast ultrasound.  Continue to take tamoxifen  daily  Plan for labs and follow up in 6 months, sooner if needed.   The patient understands the plans discussed today and is in agreement with them.  She knows to contact our office if she develops concerns prior to her next appointment.  I provided 30 minutes of face-to-face time during this encounter and > 50% was spent counseling as documented under my assessment and plan.    Powell FORBES Lessen, NP  [facility] - A DEPT OF [facility] [address] [place]  [place] [address] Dept: [phone] Dept Fax: [phone]   Orders Placed This Encounter  Procedures   US   LIMITED ULTRASOUND INCLUDING AXILLA LEFT BREAST     [facility] PF; 11/03/2022@[facility]  No need- yes issues- yes hx of br cancer- no implants- no reduction- no rt br- removed-cancer-    Standing Status:   Future    Expected Date:   10/13/2023    Expiration Date:   09/11/2024    Reason for Exam (SYMPTOM  OR DIAGNOSIS REQUIRED):   palpable nodule adjacent to areola, upper, inner aspect of left breast    Preferred Imaging [facility]?:   [facility]      CHIEF COMPLAINT:  CC: Right breast cancer, ER +  Current Treatment: Adjuvant tamoxifen  20 mg daily  INTERVAL HISTORY:  Caitlyn Miller is here today for repeat clinical assessment.  She last saw Dr. Lanny on 03/14/2023.  She has some breast tenderness along the surgical site. This is similar to previous, without significant change. Area is tender.  She continues to take tamoxifen  with minimal side effects. She denies other changes in  either breast or along the chest wall. She denies chest pain, chest pressure, or shortness of breath. She denies headaches or visual disturbances. She denies abdominal pain, nausea, vomiting, or changes in bowel or bladder habits.   She denies fevers or chills. She denies pain. Her appetite is good. Her weight has been stable.  I have reviewed the past medical history, past surgical history, social history and family history with the patient and they are unchanged from previous note.  ALLERGIES:  is allergic to ibuprofen.  MEDICATIONS:  Current Outpatient Medications  Medication Sig Dispense Refill   hydrocortisone  1 % lotion Apply 1 application topically daily as needed (rash). 118 mL 0   ondansetron  (ZOFRAN  ODT) 8 MG disintegrating tablet Take 1 tablet (8 mg total) by mouth every 8 (eight) hours as needed for nausea or vomiting. 30 tablet 0   pantoprazole  (PROTONIX ) 40 MG tablet Take 1 tablet (40 mg total) by mouth daily. 30 tablet 3   tamoxifen  (NOLVADEX ) 20 MG tablet Take 1 tablet (20 mg total) by mouth daily. 90 tablet 3    venlafaxine  (EFFEXOR ) 25 MG tablet Take 1 tablet (25 mg total) by mouth 2 (two) times daily. Ok to wean off in 4-8 weeks. In 2 weeks, decrease to 1 tab daily for 4 weeks, then 1 tab every other day for 2 weeks then stop 90 tablet 0   No current facility-administered medications for this visit.    HISTORY OF PRESENT ILLNESS:   Oncology History Overview Note  Cancer Staging Malignant neoplasm of upper-outer quadrant of right breast in female, estrogen receptor positive (HCC) Staging form: Breast, AJCC 8th Edition - Clinical stage from 11/12/2019: Stage IIA (cT2, cN1, cM0, G2, ER+, PR+, HER2-) - Signed by Lanny Callander, MD on 11/19/2019 Stage prefix: Initial diagnosis Histologic grading system: 3 grade system - Pathologic stage from 04/15/2020: No Stage Recommended (ypT2, pN2a, cM0, G2, ER+, PR+, HER2-) - Signed by Lanny Callander, MD on 05/27/2020 Stage prefix: Post-therapy Histologic grading system: 3 grade system Residual tumor (R): R1 - Microscopic    Malignant neoplasm of upper-outer quadrant of right breast in female, estrogen receptor positive (HCC)  11/05/2019 Mammogram   IMPRESSION: Large irregular palpable mass 4.7 x 1.4 x 1.4 cm within the upper-outer right breast 11 o'clock position, 4 cm from nipple.  There is a large area of associated coarse heterogeneous calcifications within the mass. Overall findings are concerning  for breast carcinoma.   Two cortically thickened right axillary lymph nodes which are indeterminate in etiology.   11/12/2019 Cancer Staging   Staging form: Breast, AJCC 8th Edition - Clinical stage from 11/12/2019: Stage IIA (cT2, cN1, cM0, G2, ER+, PR+, HER2-) - Signed by Lanny Callander, MD on 11/19/2019   11/12/2019 Initial Biopsy   Diagnosis 1. Breast, right, needle core biopsy, right - INVASIVE MAMMARY CARCINOMA, SEE COMMENT. - MAMMARY CARCINOMA IN SITU. 2. Lymph node, needle/core biopsy, right - METASTATIC MAMMARY CARCINOMA. Microscopic Comment 1. The  carcinoma appears grade 2 and measures 16 mm in greatest linear extent. E-cadherin will be ordered. Prognostic makers will be ordered. Dr. Belvie has reviewed the case. The case was [facility] on 01/13/2020.    1. E-cadherin is strongly positive consistent with a ductal phenotype.   11/12/2019 Receptors her2   1. PROGNOSTIC INDICATORS Results: IMMUNOHISTOCHEMICAL AND MORPHOMETRIC ANALYSIS PERFORMED MANUALLY The tumor cells are NEGATIVE for Her2 (0). Estrogen Receptor: 100%, POSITIVE, STRONG STAINING INTENSITY Progesterone Receptor: 100%, POSITIVE, STRONG STAINING INTENSITY Proliferation Marker Ki67: 5%   11/17/2019 Initial Diagnosis   Malignant neoplasm of upper-outer quadrant of right breast in female, estrogen receptor positive (HCC)   11/25/2019 Breast MRI   IMPRESSION: 1. Large area of non-mass enhancement involving the UPPER OUTER QUADRANT of the RIGHT breast measuring approximately 4.2 x 4.1 x 2.4 cm. The biopsy-proven IDC and DCIS is present at the superomedial aspect of this NME. 2. No MRI evidence of malignancy involving the LEFT breast. 3. Intact BILATERAL retropectoral implants. 4. 2 pathologically enlarged RIGHT axillary lymph nodes. One of these nodes is biopsy-proven metastatic disease. No pathologic lymphadenopathy elsewhere   11/25/2019 PET scan   IMPRESSION: 1. Two mildly enlarged hypermetabolic right axillary lymph nodes compatible with right axillary nodal metastases. 2. No additional sites of hypermetabolic metastatic disease. 3. Asymmetric indistinct upper right breast hypermetabolism, without discrete mass correlate on the CT images, compatible with known primary right breast malignancy.     11/26/2019 Genetic Testing   Negative genetic testing: no pathogenic variants detected in [facility] STAT Breast Cancer Panel.  Variant of uncertain significance (VUS) detected in CHEK2 at c.1556G>T (p.Arg519Leu).  The report date is  November 26, 2019.   The STAT Breast cancer panel offered by [facility] includes sequencing and rearrangement analysis for the following 9 genes:  ATM, BRCA1, BRCA2, CDH1, CHEK2, PALB2, PTEN, STK11 and TP53.    Results of [facility] Multi-Cancer Panel are pending.    12/01/2019 -  Anti-estrogen oral therapy   Neoadjuvant Tamoxifen  20mg  on 12/01/19. Reduced to 10mg  on 12/09/19.       ---Zoladex  injection monthly starting 12/09/19.       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  Variants of uncertain significance detected RAD51D at c.715C>T (p.Arg239Trp) and CHEK2 at c.1556G>T (p.Arg519Leu).  The report date is December 08, 2019.   The Multi-Cancer Panel offered by [facility] includes sequencing and/or deletion duplication testing of the following 85 genes: AIP, ALK, APC, ATM, AXIN2,BAP1,  BARD1, BLM, BMPR1A, BRCA1, BRCA2, BRIP1, CASR, CDC73, CDH1, CDK4, CDKN1B, CDKN1C, CDKN2A (p14ARF), CDKN2A (p16INK4a), CEBPA, CHEK2, CTNNA1, DICER1, DIS3L2, EGFR (c.2369C>T, p.Thr790Met variant only), EPCAM (Deletion/duplication testing only), FH, FLCN, GATA2, GPC3, GREM1 (Promoter region deletion/duplication testing only), HOXB13 (c.251G>A, p.Gly84Glu), HRAS, KIT, MAX, MEN1, MET, MITF (c.952G>A, p.Glu318Lys variant only), MLH1, MSH2, MSH3, MSH6, MUTYH, NBN, NF1, NF2,  NTHL1, PALB2, PDGFRA, PHOX2B, PMS2, POLD1, POLE, POT1, PRKAR1A, PTCH1, PTEN, RAD50, RAD51C, RAD51D, RB1, RECQL4, RET, RNF43, RUNX1, SDHAF2, SDHA (sequence changes only), SDHB, SDHC, SDHD, SMAD4, SMARCA4, SMARCB1, SMARCE1, STK11, SUFU, TERC, TERT, TMEM127, TP53, TSC1, TSC2, VHL, WRN and WT1.    12/09/2019 Miscellaneous   Mammaprint luminal type A, low risk  10% risk of recurrence in 10 years with 97.8% benefor of hormaonal therapy alone.    01/12/2020 - 03/25/2020 Chemotherapy   Verzenio  started on  01/12/2020, dose reduced to 50mg  am and 100mg  01/27/2020 due to poor tolerance. Held Verzenio  on 03/25/20 to proceed with breast surgery.    03/01/2020 Mammogram   Targeted ultrasound is performed, showing interval decreasing conspicuity of the patient's known right breast cancer. A post biopsy clip with an associated irregular area shadowing is demonstrated at the 11 o'clock position 4 cm from the nipple. Exact measurements are difficult due to the vague appearance of the findings. It measures approximately 1.2 x 0.9 x 0.6 cm (previously 4.7 x 2.2 x 1.5 cm).   IMPRESSION: Imaging findings consistent with good response to chemotherapy.   03/16/2020 Imaging   MRI Breast  IMPRESSION: 1. Interval resolution of RIGHT axillary adenopathy. 2. Slightly smaller area of non mass enhancement in the UPPER-OUTER QUADRANT of the RIGHT breast.   04/15/2020 Surgery   RIGHT BREAST LUMPECTOMY WITH RADIOACTIVE SEED LOCALIZATION and RADIOACTIVE SEED GUIDED RIGHT AXILLARY SENTINEL LYMPH NODE DISSECTION and SENTINEL NODE BIOPSY by Dr Belinda    04/15/2020 Pathology Results   FINAL MICROSCOPIC DIAGNOSIS:   A. 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  - Invasive carcinoma broadly involves each the anterior, posterior,  superior and inferior margins.  - DCIS involves the inferior margin and is < 1 mm from each the anterior  and superior margins.  - Atypical lobular hyperplasia.  - Biopsy sites.  - See oncology table.   B. TARGETED LYMPH NODE, RIGHT AXILLA, BIOPSY:  - Metastatic carcinoma in (1) of (1) lymph node.  - Biopsy site.   C. SENTINEL LYMPH NODE, RIGHT AXILLA #1, BIOPSY:  - Metastatic carcinoma in (1) of (1) lymph node.   D. SENTINEL LYMPH NODE, RIGHT AXILLA #2, BIOPSY:  - One lymph node, negative for carcinoma (0/1).     04/15/2020 Cancer Staging   Staging form: Breast, AJCC 8th Edition - Pathologic stage from 04/15/2020: No Stage Recommended (ypT2, pN2a, cM0, G2, ER+, PR+, HER2-) - Signed by Lanny Callander, MD on 05/27/2020 Stage prefix: Post-therapy Histologic grading system: 3 grade system Residual tumor (R): R1 - Microscopic   05/18/2020 Surgery   Right Mastectomy  A. BREAST, RIGHT, MASTECTOMY:  - Residual invasive carcinoma with mixed ductal and lobular features.       Residual invasive carcinoma broadly involves the anterior inferior  soft tissue margin.       Residual invasive carcinoma is focally < 1 mm from the deep margin.  - Residual ductal carcinoma in situ.       Residual DCIS is 3 mm from the closest margin, deep.  - Residual atypical lobular hyperplasia.  - Prior procedure site changes.  - Biopsy clip (X2).   B. BREAST IMPLANT, RIGHT, EXPLANTATION:  - Implant (gross only).   C. LYMPH NODES, RIGHT AXILLA, DISSECTION:  - Metastatic carcinoma in (2) of (12) lymph nodes.  - Prior procedure  site changes in surrounding soft tissue.    06/24/2020 - 09/30/2020 Chemotherapy   Patient is on Treatment Plan : BREAST ADJUVANT DOSE DENSE AC q14d / PACLitaxel  q7d     10/2022 Mammogram   3D unilateral diagnostic mammogram of left breast  IMPRESSION: 1. No mammographic or sonographic abnormalities within the LEFT breast in the area of patient's pain. 2. No mammographic evidence of LEFT breast malignancy.   RECOMMENDATION: Recommend clinical follow-up as indicated. Any further workup should be based on clinical grounds.   Screening mammogram in 1 year. BI-RADS CATEGORY  1: Negative.         REVIEW OF SYSTEMS:   Constitutional: Denies fevers, chills or abnormal weight loss Eyes: Denies blurriness of vision Ears, nose, mouth, throat, and face: Denies mucositis or sore throat Respiratory: Denies cough, dyspnea or wheezes Cardiovascular: Denies palpitation, chest discomfort or lower extremity  swelling Gastrointestinal:  Denies nausea, heartburn or change in bowel habits Skin: Denies abnormal skin rashes Lymphatics: Denies new lymphadenopathy or easy bruising Neurological:Denies numbness, tingling or new weaknesses Behavioral/Psych: Mood is stable, no new changes  All other systems were reviewed with the patient and are negative.   VITALS:   Today's Vitals   09/12/23 1322 09/12/23 1326  BP: 130/80   Pulse: 74   Resp: 17   Temp: 97.6 F (36.4 C)   SpO2: 99%   Weight: 150 lb (68 kg)   PainSc:  0-No pain   Body mass index is 25.75 kg/m.   Wt Readings from Last 3 Encounters:  09/12/23 150 lb (68 kg)  03/14/23 146 lb 1.6 oz (66.3 kg)  09/13/22 144 lb 3.2 oz (65.4 kg)    Body mass index is 25.75 kg/m.  Performance status (ECOG): 1 - Symptomatic but completely ambulatory  PHYSICAL EXAM:   GENERAL:alert, no distress and comfortable SKIN: skin color, texture, turgor are normal, no rashes or significant lesions EYES: normal, Conjunctiva are pink and non-injected, sclera clear OROPHARYNX:no exudate, no erythema and lips, buccal mucosa, and tongue normal  NECK: supple, thyroid  normal size, non-tender, without nodularity LYMPH:  no palpable lymphadenopathy in the cervical, axillary or inguinal LUNGS: clear to auscultation and percussion with normal breathing effort HEART: regular rate & rhythm and no murmurs and no lower extremity edema ABDOMEN:abdomen soft, non-tender and normal bowel sounds Musculoskeletal:no cyanosis of digits and no clubbing  NEURO: alert & oriented x 3 with fluent speech, no focal motor/sensory deficits BREAST: there are well healed surgical scars along the right inferior breast and chest wall. Intact implant noted. No palpable masses or lumps or masses along right chest wall. There is no axillary lymphadenopathy on the right. There are well healed surgical scars along left inferior breast and chest wall. Intact left breast implant. There is  palpable nodule along the anterior surgical scar. There are no other palpable masses or lumps noted. There is no  axillary lymphadenopathy on the left side.   LABORATORY DATA:  I have reviewed the data as listed    Component Value Date/Time   NA 139 09/12/2023 1302   NA 142 05/01/2017 1348   K 4.3 09/12/2023 1302   CL 106 09/12/2023 1302   CO2 27 09/12/2023 1302   GLUCOSE 85 09/12/2023 1302   BUN 10 09/12/2023 1302   BUN 7 05/01/2017 1348   CREATININE 0.60 09/12/2023 1302   CALCIUM 9.3 09/12/2023 1302   PROT 7.0 09/12/2023 1302   PROT 7.4 05/01/2017 1348   ALBUMIN 4.2 09/12/2023 1302   ALBUMIN  4.7 05/01/2017 1348   AST 37 09/12/2023 1302   ALT 52 (H) 09/12/2023 1302   ALKPHOS 47 09/12/2023 1302   BILITOT 0.5 09/12/2023 1302   GFRNONAA >60 09/12/2023 1302   GFRAA 137 05/01/2017 1348    Lab Results  Component Value Date   WBC 6.9 09/12/2023   NEUTROABS 3.8 09/12/2023   HGB 12.1 09/12/2023   HCT 36.6 09/12/2023   MCV 92.9 09/12/2023   PLT 224 09/12/2023

## 2023-09-11 NOTE — Assessment & Plan Note (Addendum)
 Stage IIA, p(T2N1aM0), ER+/PR+/HER2-, Grade II, Mammaprint luminal type A, low risk -presented with large palpable mass. Breast MRI and PET scan on 11/25/19 were otherwise negative.  -Mammaprint showed low risk disease. -She started neoadjuvant tamoxifen  on 12/01/19, tolerated poorly. Dose was reduced to 10mg  and she started Zoladex  from 12/09/19, and switched to anastrozole  01/06/20 - 06/2020 (held for chemo). Zoladex  discontinued 09/16/20.  -she took Verzenio  on 01/12/20 - 03/25/20 but tolerated poorly. -initially underwent lumpectomy on 04/15/21 with Dr. Belinda but was found to have a 3.1 cm tumor with positive margins and 2/3 positive lymph nodes. -s/p mastectomy on 05/18/20 showing: residual invasive carcinoma and DCIS, a positive margin, and another 2/12 positive nodes, indicating stage III disease.  -chest CT 06/17/20 and CT AP 07/02/20 were negative. -received one cycle AC on 06/24/20, discontinued due to very poor tolerance. -she completed 12 weeks Taxol  07/08/20 - 10/29/20. -s/p adjuvant RT 10/25/20 - 12/14/20 but developed severe chest pain. -post-treatment PET on 01/06/21 was NED. -she restarted tamoxifen  20mg  in 12/2020 due to poor tolerance of anastrozole /Verzenio , held for month of 04/2021 for reconstruction. She is tolerating well with controlled hot flashes on effexor . --most recent left mammogram 06/09/21 was negative. -she had breast implant placement on right and exchange on left side on 11/18/2021 - CT abdomen pelvis in March 2025 showed no acute abnormality and no evidence of metastatic disease.  There is a 3.3 cm left ovarian cyst consistent with corpus luteum. - Initial Signatera done 04/2023 with negative results.  Repeat Signatera to be done in 10/2023.  New left breast ultrasound ordered to further evaluate palpable nodule.  Continue tamoxifen  daily. Plan for labs and follow up in 6 months, sooner if needed.

## 2023-09-12 ENCOUNTER — Inpatient Hospital Stay: Payer: 59 | Admitting: Nurse Practitioner

## 2023-09-12 ENCOUNTER — Inpatient Hospital Stay: Payer: 59 | Attending: Nurse Practitioner

## 2023-09-12 ENCOUNTER — Other Ambulatory Visit: Payer: Self-pay | Admitting: Nurse Practitioner

## 2023-09-12 VITALS — BP 130/80 | HR 74 | Temp 97.6°F | Resp 17 | Wt 150.0 lb

## 2023-09-12 DIAGNOSIS — Z17411 Hormone receptor positive with human epidermal growth factor receptor 2 negative status: Secondary | ICD-10-CM | POA: Diagnosis not present

## 2023-09-12 DIAGNOSIS — C50411 Malignant neoplasm of upper-outer quadrant of right female breast: Secondary | ICD-10-CM | POA: Diagnosis present

## 2023-09-12 DIAGNOSIS — N644 Mastodynia: Secondary | ICD-10-CM | POA: Diagnosis not present

## 2023-09-12 DIAGNOSIS — Z9882 Breast implant status: Secondary | ICD-10-CM | POA: Diagnosis not present

## 2023-09-12 DIAGNOSIS — Z9011 Acquired absence of right breast and nipple: Secondary | ICD-10-CM | POA: Diagnosis not present

## 2023-09-12 DIAGNOSIS — Z886 Allergy status to analgesic agent status: Secondary | ICD-10-CM | POA: Insufficient documentation

## 2023-09-12 DIAGNOSIS — Z79899 Other long term (current) drug therapy: Secondary | ICD-10-CM | POA: Diagnosis not present

## 2023-09-12 DIAGNOSIS — Z170 Estrogen receptor positive status [ER+]: Secondary | ICD-10-CM

## 2023-09-12 DIAGNOSIS — N83202 Unspecified ovarian cyst, left side: Secondary | ICD-10-CM | POA: Insufficient documentation

## 2023-09-12 DIAGNOSIS — N6489 Other specified disorders of breast: Secondary | ICD-10-CM | POA: Diagnosis not present

## 2023-09-12 DIAGNOSIS — Z79810 Long term (current) use of selective estrogen receptor modulators (SERMs): Secondary | ICD-10-CM | POA: Insufficient documentation

## 2023-09-12 DIAGNOSIS — Z9221 Personal history of antineoplastic chemotherapy: Secondary | ICD-10-CM | POA: Diagnosis not present

## 2023-09-12 DIAGNOSIS — N6322 Unspecified lump in the left breast, upper inner quadrant: Secondary | ICD-10-CM

## 2023-09-12 LAB — CMP (CANCER CENTER ONLY)
ALT: 52 U/L — ABNORMAL HIGH (ref 0–44)
AST: 37 U/L (ref 15–41)
Albumin: 4.2 g/dL (ref 3.5–5.0)
Alkaline Phosphatase: 47 U/L (ref 38–126)
Anion gap: 6 (ref 5–15)
BUN: 10 mg/dL (ref 6–20)
CO2: 27 mmol/L (ref 22–32)
Calcium: 9.3 mg/dL (ref 8.9–10.3)
Chloride: 106 mmol/L (ref 98–111)
Creatinine: 0.6 mg/dL (ref 0.44–1.00)
GFR, Estimated: >60 mL/min (ref >60)
Glucose, Bld: 85 mg/dL (ref 70–99)
Potassium: 4.3 mmol/L (ref 3.5–5.1)
Sodium: 139 mmol/L (ref 135–145)
Total Bilirubin: 0.5 mg/dL (ref 0.0–1.2)
Total Protein: 7 g/dL (ref 6.5–8.1)

## 2023-09-12 LAB — CBC WITH DIFFERENTIAL (CANCER CENTER ONLY)
Abs Immature Granulocytes: 0.02 K/uL (ref 0.00–0.07)
Basophils Absolute: 0 K/uL (ref 0.0–0.1)
Basophils Relative: 0 %
Eosinophils Absolute: 0.1 K/uL (ref 0.0–0.5)
Eosinophils Relative: 1 %
HCT: 36.6 % (ref 36.0–46.0)
Hemoglobin: 12.1 g/dL (ref 12.0–15.0)
Immature Granulocytes: 0 %
Lymphocytes Relative: 36 %
Lymphs Abs: 2.5 K/uL (ref 0.7–4.0)
MCH: 30.7 pg (ref 26.0–34.0)
MCHC: 33.1 g/dL (ref 30.0–36.0)
MCV: 92.9 fL (ref 80.0–100.0)
Monocytes Absolute: 0.5 K/uL (ref 0.1–1.0)
Monocytes Relative: 8 %
Neutro Abs: 3.8 K/uL (ref 1.7–7.7)
Neutrophils Relative %: 55 %
Platelet Count: 224 K/uL (ref 150–400)
RBC: 3.94 MIL/uL (ref 3.87–5.11)
RDW: 12.5 % (ref 11.5–15.5)
WBC Count: 6.9 K/uL (ref 4.0–10.5)
nRBC: 0 % (ref 0.0–0.2)

## 2023-09-15 ENCOUNTER — Encounter: Payer: Self-pay | Admitting: Nurse Practitioner

## 2023-09-24 ENCOUNTER — Other Ambulatory Visit: Payer: Self-pay | Admitting: Hematology

## 2023-09-27 ENCOUNTER — Other Ambulatory Visit: Payer: Self-pay | Admitting: Nurse Practitioner

## 2023-09-27 ENCOUNTER — Ambulatory Visit
Admission: RE | Admit: 2023-09-27 | Discharge: 2023-09-27 | Disposition: A | Discharge status: Home / Self Care | Source: Ambulatory Visit | Attending: Nurse Practitioner | Admitting: Nurse Practitioner

## 2023-09-27 DIAGNOSIS — N6322 Unspecified lump in the left breast, upper inner quadrant: Secondary | ICD-10-CM

## 2023-09-27 DIAGNOSIS — Z170 Estrogen receptor positive status [ER+]: Secondary | ICD-10-CM

## 2023-09-28 ENCOUNTER — Other Ambulatory Visit: Payer: Self-pay | Admitting: Nurse Practitioner

## 2023-09-28 DIAGNOSIS — N6320 Unspecified lump in the left breast, unspecified quadrant: Secondary | ICD-10-CM

## 2023-09-30 ENCOUNTER — Ambulatory Visit: Payer: Self-pay | Admitting: Nurse Practitioner

## 2023-10-02 ENCOUNTER — Other Ambulatory Visit: Payer: Self-pay

## 2023-10-02 MED ORDER — ONDANSETRON 8 MG PO TBDP: 8.0000 mg | ORAL_TABLET | Freq: Three times a day (TID) | ORAL | 0 refills | Status: AC | PRN

## 2024-01-19 ENCOUNTER — Other Ambulatory Visit: Payer: Self-pay | Admitting: Hematology

## 2024-01-27 ENCOUNTER — Emergency Department (HOSPITAL_COMMUNITY)

## 2024-01-27 ENCOUNTER — Emergency Department (HOSPITAL_COMMUNITY)
Admission: EM | Admit: 2024-01-27 | Discharge: 2024-01-28 | Disposition: A | Discharge status: Home / Self Care | Attending: Emergency Medicine | Admitting: Emergency Medicine

## 2024-01-27 ENCOUNTER — Other Ambulatory Visit: Payer: Self-pay

## 2024-01-27 DIAGNOSIS — R350 Frequency of micturition: Secondary | ICD-10-CM | POA: Insufficient documentation

## 2024-01-27 DIAGNOSIS — R1031 Right lower quadrant pain: Secondary | ICD-10-CM | POA: Diagnosis not present

## 2024-01-27 DIAGNOSIS — R112 Nausea with vomiting, unspecified: Secondary | ICD-10-CM | POA: Diagnosis not present

## 2024-01-27 DIAGNOSIS — M545 Low back pain, unspecified: Secondary | ICD-10-CM | POA: Diagnosis not present

## 2024-01-27 DIAGNOSIS — Z853 Personal history of malignant neoplasm of breast: Secondary | ICD-10-CM | POA: Diagnosis not present

## 2024-01-27 DIAGNOSIS — R1032 Left lower quadrant pain: Secondary | ICD-10-CM | POA: Diagnosis not present

## 2024-01-27 LAB — URINALYSIS, ROUTINE W REFLEX MICROSCOPIC
Bilirubin Urine: NEGATIVE
Glucose, UA: 50 mg/dL — AB
Hgb urine dipstick: NEGATIVE
Ketones, ur: NEGATIVE mg/dL
Leukocytes,Ua: NEGATIVE
Nitrite: NEGATIVE
Protein, ur: NEGATIVE mg/dL
Specific Gravity, Urine: 1.019 (ref 1.005–1.030)
pH: 5 (ref 5.0–8.0)

## 2024-01-27 LAB — CBC WITH DIFFERENTIAL/PLATELET
Abs Immature Granulocytes: 0.02 K/uL (ref 0.00–0.07)
Basophils Absolute: 0 K/uL (ref 0.0–0.1)
Basophils Relative: 1 %
Eosinophils Absolute: 0.1 K/uL (ref 0.0–0.5)
Eosinophils Relative: 1 %
HCT: 38.5 % (ref 36.0–46.0)
Hemoglobin: 12.8 g/dL (ref 12.0–15.0)
Immature Granulocytes: 0 %
Lymphocytes Relative: 44 %
Lymphs Abs: 3.5 K/uL (ref 0.7–4.0)
MCH: 31.5 pg (ref 26.0–34.0)
MCHC: 33.2 g/dL (ref 30.0–36.0)
MCV: 94.8 fL (ref 80.0–100.0)
Monocytes Absolute: 0.8 K/uL (ref 0.1–1.0)
Monocytes Relative: 10 %
Neutro Abs: 3.7 K/uL (ref 1.7–7.7)
Neutrophils Relative %: 44 %
Platelets: 254 K/uL (ref 150–400)
RBC: 4.06 MIL/uL (ref 3.87–5.11)
RDW: 12.3 % (ref 11.5–15.5)
WBC: 8 K/uL (ref 4.0–10.5)
nRBC: 0 % (ref 0.0–0.2)

## 2024-01-27 LAB — I-STAT CHEM 8, ED
BUN: 10 mg/dL (ref 6–20)
Calcium, Ion: 1.14 mmol/L — ABNORMAL LOW (ref 1.15–1.40)
Chloride: 106 mmol/L (ref 98–111)
Creatinine, Ser: 0.5 mg/dL (ref 0.44–1.00)
Glucose, Bld: 86 mg/dL (ref 70–99)
HCT: 34 % — ABNORMAL LOW (ref 36.0–46.0)
Hemoglobin: 11.6 g/dL — ABNORMAL LOW (ref 12.0–15.0)
Potassium: 3.8 mmol/L (ref 3.5–5.1)
Sodium: 139 mmol/L (ref 135–145)
TCO2: 20 mmol/L — ABNORMAL LOW (ref 22–32)

## 2024-01-27 LAB — RESP PANEL BY RT-PCR (RSV, FLU A&B, COVID)  RVPGX2
Influenza A by PCR: NEGATIVE
Influenza B by PCR: NEGATIVE
Resp Syncytial Virus by PCR: NEGATIVE
SARS Coronavirus 2 by RT PCR: NEGATIVE

## 2024-01-27 LAB — HCG, SERUM, QUALITATIVE: Preg, Serum: NEGATIVE

## 2024-01-27 MED ORDER — ACETAMINOPHEN ER 650 MG PO TBCR: 650.0000 mg | EXTENDED_RELEASE_TABLET | Freq: Three times a day (TID) | ORAL | 0 refills | Status: AC | PRN

## 2024-01-27 MED ORDER — LIDOCAINE 5 % EX PTCH: 1.0000 | MEDICATED_PATCH | CUTANEOUS | 0 refills | Status: AC

## 2024-01-27 MED ORDER — LIDOCAINE 5 % EX PTCH
2.0000 | MEDICATED_PATCH | CUTANEOUS | Status: DC
  Administered 2024-01-27: 2 via TRANSDERMAL
  Filled 2024-01-27: qty 2

## 2024-01-27 MED ORDER — OXYCODONE-ACETAMINOPHEN 5-325 MG PO TABS
1.0000 | ORAL_TABLET | Freq: Once | ORAL | Status: DC
  Filled 2024-01-27: qty 1

## 2024-01-27 MED ORDER — ONDANSETRON 4 MG PO TBDP: 4.0000 mg | ORAL_TABLET | Freq: Three times a day (TID) | ORAL | 0 refills | Status: AC | PRN

## 2024-01-27 MED ORDER — KETOROLAC TROMETHAMINE 15 MG/ML IJ SOLN
15.0000 mg | Freq: Once | INTRAMUSCULAR | Status: AC
  Administered 2024-01-27: 15 mg via INTRAVENOUS
  Filled 2024-01-27: qty 1

## 2024-01-27 MED ORDER — IOHEXOL 300 MG/ML  SOLN
100.0000 mL | Freq: Once | INTRAMUSCULAR | Status: AC | PRN
  Administered 2024-01-27: 100 mL via INTRAVENOUS

## 2024-01-27 MED ORDER — ONDANSETRON 4 MG PO TBDP
4.0000 mg | ORAL_TABLET | Freq: Once | ORAL | Status: AC
  Administered 2024-01-27: 4 mg via ORAL
  Filled 2024-01-27: qty 1

## 2024-01-27 NOTE — ED Triage Notes (Signed)
 Patient reports low back pain for 6 months, urinary frequency, has been seen by PCP and has lab work with no DX. Patient is alert and oriented x 4. Airway patent, respirations even and unlabored. Skin normal, warm and dry.

## 2024-01-27 NOTE — ED Provider Notes (Incomplete)
 " [place] [facility] AT [facility] Provider Note   CSN: [id number] Arrival date & time: 01/27/24  1545     Patient presents with: Back Pain and Nausea   Caitlyn Miller is a 39 y.o. female with PMHx of breast cancer (remission for past 4 yrs and currently on Tamoxifen ), migraines, GERD, urinary frequency presents to ED for evaluation of urinary frequency, bilateral back pain. Back pain radiates into buttocks bilaterally. Has been occurring intermittently over past 6 mo. No paresthesia, numbness in BLE. Denies urinary incontinence, saddle paresthesia, fevers, history of IVDU, known traumatic injury.  Also complains of urinary frequency for past year. Has trailed medications for OAB in past wo improvement. Recently saw urogynecology for urinary frequency. Has appointment with neurourology next month for urinary frequency.  Denies dysuria, burning with urination, hx of stones  Also complains of NV over past five days. Vomiting 2-3x a day. Has zofran  has home which helps with NV. Does not use ETOH nor marijuana.  No known sick contacts.  No recent travel or suspicious foods.  No recent medication changes.   {Add pertinent medical, surgical, social history, OB history to HPI:32947}  Back Pain      Prior to Admission medications  Medication Sig Start Date End Date Taking? Authorizing Provider  hydrocortisone  1 % lotion Apply 1 application topically daily as needed (rash). 09/24/20   Lanny Callander, MD  ondansetron  (ZOFRAN  ODT) 8 MG disintegrating tablet Take 1 tablet (8 mg total) by mouth every 8 (eight) hours as needed for nausea or vomiting. 10/02/23   Lanny Callander, MD  pantoprazole  (PROTONIX ) 40 MG tablet TAKE 1 TABLET(40 MG) BY MOUTH DAILY 09/25/23   Lanny Callander, MD  tamoxifen  (NOLVADEX ) 20 MG tablet Take 1 tablet (20 mg total) by mouth daily. 03/14/23   Lanny Callander, MD  venlafaxine  (EFFEXOR ) 25 MG tablet Take 1 tablet (25 mg total) by mouth 2 (two) times daily. Ok to wean off in 4-8  weeks. In 2 weeks, decrease to 1 tab daily for 4 weeks, then 1 tab every other day for 2 weeks then stop 03/14/23   Lanny Callander, MD  venlafaxine  XR (EFFEXOR -XR) 37.5 MG 24 hr capsule TAKE 1 CAPSULE(37.5 MG) BY MOUTH DAILY WITH BREAKFAST 09/25/23   Lanny Callander, MD    Allergies: Ibuprofen    Review of Systems  Musculoskeletal:  Positive for back pain.    Updated Vital Signs BP 114/78 (BP Location: Left Arm)   Pulse 70   Temp 98.2 F (36.8 C) (Oral)   Resp 16   LMP 01/11/2024   SpO2 100%   Physical Exam Vitals and nursing note reviewed.  Constitutional:      General: She is not in acute distress.    Appearance: Normal appearance.  HENT:     Head: Normocephalic and atraumatic.  Eyes:     Conjunctiva/sclera: Conjunctivae normal.  Cardiovascular:     Rate and Rhythm: Normal rate.  Pulmonary:     Effort: Pulmonary effort is normal. No respiratory distress.  Abdominal:     General: Bowel sounds are normal. There is no distension.     Palpations: Abdomen is soft.     Tenderness: There is abdominal tenderness in the right lower quadrant and left lower quadrant. There is no guarding or rebound.     Comments: Nonsurgical abdomen with no rebound tenderness no peritoneal signs  Musculoskeletal:     Cervical back: No bony tenderness.     Thoracic back: No bony tenderness.  Lumbar back: Tenderness present. No bony tenderness.       Back:  Skin:    Coloration: Skin is not jaundiced or pale.  Neurological:     Mental Status: She is alert and oriented to person, place, and time. Mental status is at baseline.     (all labs ordered are listed, but only abnormal results are displayed) Labs Reviewed  URINALYSIS, ROUTINE W REFLEX MICROSCOPIC - Abnormal; Notable for the following components:      Result Value   APPearance HAZY (*)    Glucose, UA 50 (*)    All other components within normal limits  I-STAT CHEM 8, ED - Abnormal; Notable for the following components:   Calcium, Ion 1.14 (*)     TCO2 20 (*)    Hemoglobin 11.6 (*)    HCT 34.0 (*)    All other components within normal limits  RESP PANEL BY RT-PCR (RSV, FLU A&B, COVID)  RVPGX2  CBC WITH DIFFERENTIAL/PLATELET  HCG, SERUM, QUALITATIVE  COMPREHENSIVE METABOLIC PANEL WITH GFR  LIPASE, BLOOD    EKG: None  Radiology: CT ABDOMEN PELVIS W CONTRAST Result Date: 01/27/2024 EXAM: CT ABDOMEN AND PELVIS WITH CONTRAST 01/27/2024 09:41:35 PM TECHNIQUE: CT of the abdomen and pelvis was performed with the administration of 100 mL of iohexol  (OMNIPAQUE ) 300 MG/ML solution. Multiplanar reformatted images are provided for review. Automated exposure control, iterative reconstruction, and/or weight-based adjustment of the mA/kV was utilized to reduce the radiation dose to as low as reasonably achievable. COMPARISON: ct abd/pelvis 03/27/23 CLINICAL HISTORY: LLQ abdominal pain; RLQ abdominal pain. FINDINGS: LOWER CHEST: No acute abnormality. LIVER: Diffusely hypodense hepatic parenchyma. GALLBLADDER AND BILE DUCTS: Gallbladder is unremarkable. No biliary ductal dilatation. SPLEEN: No acute abnormality. PANCREAS: No acute abnormality. ADRENAL GLANDS: No acute abnormality. KIDNEYS, URETERS AND BLADDER: No stones in the kidneys or ureters. No hydronephrosis. No perinephric or periureteral stranding. Urinary bladder is unremarkable. GI AND BOWEL: Stomach demonstrates no acute abnormality. No small or large bowel thickening or dilatation. The appendix is unremarkable. There is no bowel obstruction. PERITONEUM AND RETROPERITONEUM: No ascites. No free air. VASCULATURE: Aorta is normal in caliber. LYMPH NODES: No lymphadenopathy. REPRODUCTIVE ORGANS: Grossly unremarkable uterus. Nabothian cysts. No adnexal mass. Right ovarian corpus luteum cyst. Slightly irregular endometrium measuring up to 11 mm. BONES AND SOFT TISSUES: Intact partially visualized bilateral breast implants. No acute osseous abnormality. No focal soft tissue abnormality. IMPRESSION: 1.  Slightly irregular endometrium measuring up to 11 mm; consider pelvic ultrasound for further assessment. 2. Hepatic steatosis. Electronically signed by: Morgane Naveau MD MD 01/27/2024 09:51 PM EST RP Workstation: [id number]     Medications Ordered in the ED  oxyCODONE -acetaminophen  (PERCOCET/ROXICET) 5-325 MG per tablet 1 tablet (0 tablets Oral Hold 01/27/24 1719)  ondansetron  (ZOFRAN -ODT) disintegrating tablet 4 mg (4 mg Oral Given 01/27/24 1718)  iohexol  (OMNIPAQUE ) 300 MG/ML solution 100 mL (100 mLs Intravenous Contrast Given 01/27/24 2134)      {Click here for ABCD2, HEART and other calculators REFRESH Note before signing:1}                              Medical Decision Making Amount and/or Complexity of Data Reviewed Labs: ordered. Radiology: ordered.  Risk OTC drugs. Prescription drug management.   Patient presents to the ED for concern of lower abd pain, urinary frequency, back pain, this involves an extensive number of treatment options, and is a complaint that carries with it a high risk  of complications and morbidity.  The differential diagnosis includes UTI, stone, pyelonephritis, diverticulitis, appendicitis, gastroenteritis, food poisoning, muscle strain, radiculopathy, sciatica, cauda equina, spinal impingement    Co morbidities that complicate the patient evaluation  See hpi   Additional history obtained:  Additional history obtained from Nursing   External records from outside source obtained and reviewed including triage RN note, gyn urology note from 10/12/23   Lab Tests:  I Ordered, and personally interpreted labs.  The pertinent results include:   Creatinine WNL Resp panel neg   Imaging Studies ordered:  I ordered imaging studies including CT abd pelvis  I independently visualized and interpreted imaging which showed  Slightly irregular endometrium measuring up to 11 mm  Hepatic steatosis.  I agree with the radiologist  interpretation    Medicines ordered and prescription drug management:  I ordered medication including ***  for ***  Reevaluation of the patient after these medicines showed that the patient {resolved/improved/worsened:23923::improved} I have reviewed the patients home medicines and have made adjustments as needed   Test Considered:  ***   Critical Interventions:  ***   Consultations Obtained:  I requested consultation with the ***,  and discussed lab and imaging findings as well as pertinent plan - they recommend: ***   Problem List / ED Course:  ***   Reevaluation:  After the interventions noted above, I reevaluated the patient and found that they have :{resolved/improved/worsened:23923::improved}   Social Determinants of Health:  ***   Dispostion:  After consideration of the diagnostic results and the patients response to treatment, I feel that the patent would benefit from ***.   [facility]  {Document critical care time when appropriate  Document review of labs and clinical decision tools ie CHADS2VASC2, etc  Document your independent review of radiology images and any outside records  Document your discussion with family members, caretakers and with consultants  Document social determinants of health affecting pt's care  Document your decision making why or why not admission, treatments were needed:32947:::1}   Final diagnoses:  None    ED Discharge Orders     None        "

## 2024-01-27 NOTE — ED Provider Notes (Signed)
 " [place] [facility]ENCY DEPARTMENT AT [facility] Provider Note   CSN: [id number] Arrival date & time: 01/27/24  1545     Patient presents with: Back Pain and Nausea   Caitlyn Miller is a 39 y.o. female with PMHx of breast cancer (remission for past 4 yrs and currently on Tamoxifen ), migraines, GERD, urinary frequency presents to ED for evaluation of urinary frequency, bilateral back pain. Back pain radiates into buttocks bilaterally. Has been occurring intermittently over past 6 mo. No paresthesia, numbness in BLE. Denies urinary incontinence, saddle paresthesia, fevers, history of IVDU, known traumatic injury.  Also complains of urinary frequency for past year. Has trailed medications for OAB in past wo improvement. Recently saw urogynecology for urinary frequency. Has appointment with neurourology next month for urinary frequency.  Denies dysuria, burning with urination, hx of stones  Also complains of NV over past five days. Vomiting 2-3x a day. Has zofran  has home which helps with NV. Does not use ETOH nor marijuana.  No known sick contacts.  No recent travel or suspicious foods.  No recent medication changes.     Back Pain      Prior to Admission medications  Medication Sig Start Date End Date Taking? Authorizing Provider  acetaminophen  (TYLENOL  8 HOUR) 650 MG CR tablet Take 1 tablet (650 mg total) by mouth every 8 (eight) hours as needed for up to 10 days for pain. 01/27/24 02/06/24 Yes Jaramie Bastos E, PA  lidocaine  (LIDODERM ) 5 % Place 1 patch onto the skin daily. Remove & Discard patch within 12 hours or as directed by MD 01/27/24  Yes Minnie Tinnie BRAVO, PA  ondansetron  (ZOFRAN -ODT) 4 MG disintegrating tablet Take 1 tablet (4 mg total) by mouth every 8 (eight) hours as needed for nausea or vomiting. 01/27/24  Yes Minnie Tinnie BRAVO, PA  hydrocortisone  1 % lotion Apply 1 application topically daily as needed (rash). 09/24/20   Lanny Callander, MD  ondansetron  (ZOFRAN  ODT) 8 MG  disintegrating tablet Take 1 tablet (8 mg total) by mouth every 8 (eight) hours as needed for nausea or vomiting. 10/02/23   Lanny Callander, MD  pantoprazole  (PROTONIX ) 40 MG tablet TAKE 1 TABLET(40 MG) BY MOUTH DAILY 09/25/23   Lanny Callander, MD  tamoxifen  (NOLVADEX ) 20 MG tablet Take 1 tablet (20 mg total) by mouth daily. 03/14/23   Lanny Callander, MD  venlafaxine  (EFFEXOR ) 25 MG tablet Take 1 tablet (25 mg total) by mouth 2 (two) times daily. Ok to wean off in 4-8 weeks. In 2 weeks, decrease to 1 tab daily for 4 weeks, then 1 tab every other day for 2 weeks then stop 03/14/23   Lanny Callander, MD  venlafaxine  XR (EFFEXOR -XR) 37.5 MG 24 hr capsule TAKE 1 CAPSULE(37.5 MG) BY MOUTH DAILY WITH BREAKFAST 09/25/23   Lanny Callander, MD    Allergies: Ibuprofen    Review of Systems  Musculoskeletal:  Positive for back pain.    Updated Vital Signs BP 114/78 (BP Location: Left Arm)   Pulse 70   Temp 98.2 F (36.8 C) (Oral)   Resp 16   LMP 01/11/2024   SpO2 100%   Physical Exam Vitals and nursing note reviewed.  Constitutional:      General: She is not in acute distress.    Appearance: Normal appearance.  HENT:     Head: Normocephalic and atraumatic.  Eyes:     Conjunctiva/sclera: Conjunctivae normal.  Cardiovascular:     Rate and Rhythm: Normal rate.  Pulmonary:  Effort: Pulmonary effort is normal. No respiratory distress.  Abdominal:     General: Bowel sounds are normal. There is no distension.     Palpations: Abdomen is soft.     Tenderness: There is abdominal tenderness in the right lower quadrant and left lower quadrant. There is no guarding or rebound.     Comments: Nonsurgical abdomen with no rebound tenderness no peritoneal signs  Musculoskeletal:     Cervical back: No bony tenderness.     Thoracic back: No bony tenderness.     Lumbar back: Tenderness present. No bony tenderness.       Back:  Skin:    Coloration: Skin is not jaundiced or pale.  Neurological:     Mental Status: She is alert and  oriented to person, place, and time. Mental status is at baseline.     (all labs ordered are listed, but only abnormal results are displayed) Labs Reviewed  URINALYSIS, ROUTINE W REFLEX MICROSCOPIC - Abnormal; Notable for the following components:      Result Value   APPearance HAZY (*)    Glucose, UA 50 (*)    All other components within normal limits  I-STAT CHEM 8, ED - Abnormal; Notable for the following components:   Calcium, Ion 1.14 (*)    TCO2 20 (*)    Hemoglobin 11.6 (*)    HCT 34.0 (*)    All other components within normal limits  RESP PANEL BY RT-PCR (RSV, FLU A&B, COVID)  RVPGX2  CBC WITH DIFFERENTIAL/PLATELET  HCG, SERUM, QUALITATIVE  COMPREHENSIVE METABOLIC PANEL WITH GFR  LIPASE, BLOOD    EKG: None  Radiology: CT ABDOMEN PELVIS W CONTRAST Result Date: 01/27/2024 EXAM: CT ABDOMEN AND PELVIS WITH CONTRAST 01/27/2024 09:41:35 PM TECHNIQUE: CT of the abdomen and pelvis was performed with the administration of 100 mL of iohexol  (OMNIPAQUE ) 300 MG/ML solution. Multiplanar reformatted images are provided for review. Automated exposure control, iterative reconstruction, and/or weight-based adjustment of the mA/kV was utilized to reduce the radiation dose to as low as reasonably achievable. COMPARISON: ct abd/pelvis 03/27/23 CLINICAL HISTORY: LLQ abdominal pain; RLQ abdominal pain. FINDINGS: LOWER CHEST: No acute abnormality. LIVER: Diffusely hypodense hepatic parenchyma. GALLBLADDER AND BILE DUCTS: Gallbladder is unremarkable. No biliary ductal dilatation. SPLEEN: No acute abnormality. PANCREAS: No acute abnormality. ADRENAL GLANDS: No acute abnormality. KIDNEYS, URETERS AND BLADDER: No stones in the kidneys or ureters. No hydronephrosis. No perinephric or periureteral stranding. Urinary bladder is unremarkable. GI AND BOWEL: Stomach demonstrates no acute abnormality. No small or large bowel thickening or dilatation. The appendix is unremarkable. There is no bowel obstruction.  PERITONEUM AND RETROPERITONEUM: No ascites. No free air. VASCULATURE: Aorta is normal in caliber. LYMPH NODES: No lymphadenopathy. REPRODUCTIVE ORGANS: Grossly unremarkable uterus. Nabothian cysts. No adnexal mass. Right ovarian corpus luteum cyst. Slightly irregular endometrium measuring up to 11 mm. BONES AND SOFT TISSUES: Intact partially visualized bilateral breast implants. No acute osseous abnormality. No focal soft tissue abnormality. IMPRESSION: 1. Slightly irregular endometrium measuring up to 11 mm; consider pelvic ultrasound for further assessment. 2. Hepatic steatosis. Electronically signed by: Morgane Naveau MD MD 01/27/2024 09:51 PM EST RP Workstation: [id number]     Medications Ordered in the ED  lidocaine  (LIDODERM ) 5 % 2 patch (2 patches Transdermal Patch Applied 01/27/24 2307)  ondansetron  (ZOFRAN -ODT) disintegrating tablet 4 mg (4 mg Oral Given 01/27/24 1718)  iohexol  (OMNIPAQUE ) 300 MG/ML solution 100 mL (100 mLs Intravenous Contrast Given 01/27/24 2134)  ketorolac  (TORADOL ) 15 MG/ML injection 15 mg (15 mg  Intravenous Given 01/27/24 2307)                                    Medical Decision Making Amount and/or Complexity of Data Reviewed Labs: ordered. Radiology: ordered.  Risk OTC drugs. Prescription drug management.   Patient presents to the ED for concern of lower abd pain, urinary frequency, back pain, this involves an extensive number of treatment options, and is a complaint that carries with it a high risk of complications and morbidity.  The differential diagnosis includes UTI, stone, pyelonephritis, diverticulitis, appendicitis, gastroenteritis, food poisoning, pregnancy,, muscle strain, radiculopathy, sciatica, cauda equina, spinal impingement    Co morbidities that complicate the patient evaluation  See hpi   Additional history obtained:  Additional history obtained from Nursing   External records from outside source obtained and reviewed including triage  RN note, gyn urology note from 10/12/23   Lab Tests:  I Ordered, and personally interpreted labs.  The pertinent results include:   Creatinine WNL Resp panel neg   Imaging Studies ordered:  I ordered imaging studies including CT abd pelvis  I independently visualized and interpreted imaging which showed  Slightly irregular endometrium measuring up to 11 mm  Hepatic steatosis.  I agree with the radiologist interpretation    Medicines ordered and prescription drug management:  I ordered medication including tylenol , zofran , toradol , lidocaine  patches  for pain, nausea  Reevaluation of the patient after these medicines showed that the patient improved I have reviewed the patients home medicines and have made adjustments as needed    Problem List / ED Course:  Lower abd pain Vital signs stable no tachycardia No leukocytosis nor anion gap to indicate acidosis Resp panel negative UA without infection Creatinine WNL Panel negative Does endorse that she took ibuprofen for pain but has had reactions of nausea, vomiting in the past from this.  This side effect was added as an allergy today by nursing No vomiting while in ED.  Passes p.o. challenge No focal tenderness. Low suspicion of torsion CT wo acute abnormalities Likely 2/2 viral gastroenteritis  NV No diarrhea Has zofran  at home with improvement No vomiting while in ED - passes PO No known sick contacts hCG negative  Urinary frequency Has been seen by urogynecology for this in the past Nonacute UA without infection, Hgb CT without signs of hydronephrosis, cystitis, UTI, nor stone Patient is to follow-up with neuro urology next month for the symptoms  Lumbar back pain Been intermittently occurring over the past 6 months No known trauma No hx of sciatica No midline tenderness.  Tenderness is located in paraspinous musculature bilaterally Denies saddle paresthesia, urinary incontinence.  Low suspicion for cauda  equina No fever nor leukocytosis.  No history of IVDU.  Low suspicion for spinal abscess Does have a history of neoplasm however has been in remission over the past 4 years.  Although CT imaging is not the most specific test for neoplasm, masses, no obvious mass noted on CT imaging    Reevaluation:  After the interventions noted above, I reevaluated the patient and found that they have :improved    Dispostion:  After consideration of the diagnostic results and the patients response to treatment, I feel that the patent would benefit from outpatient symptomatic treatment with specialist follow-up  Discussed ED workup, disposition, return to ED precautions with patient who expresses understanding agrees with plan.  All questions answered to their  satisfaction.  They are agreeable to plan.  Discharge instructions provided on paperwork  Final diagnoses:  Acute bilateral low back pain without sciatica  Urinary frequency  Nausea and vomiting, unspecified vomiting type    ED Discharge Orders          Ordered    lidocaine  (LIDODERM ) 5 %  Every 24 hours        01/27/24 2334    acetaminophen  (TYLENOL  8 HOUR) 650 MG CR tablet  Every 8 hours PRN        01/27/24 2334    ondansetron  (ZOFRAN -ODT) 4 MG disintegrating tablet  Every 8 hours PRN        01/27/24 2334             Minnie Tinnie BRAVO, PA 01/28/24 0041  "

## 2024-01-27 NOTE — Discharge Instructions (Addendum)
 Thank for letting us  evaluate you today.  Your kidney function is within normal limits.  Your electrolytes are within normal limits.  You do not have a UTI.  Your CT study did not show any infection to kidneys nor kidney stones.  No infections to organs in the abdomen.  Nausea and vomiting is likely secondary to gastroenteritis or stomach virus.  This should pass on its own within the next few days.  You may continue using Zofran  at home for nausea, vomiting.  Please make sure to drink plenty of water.  Please make an appointment with [facility] gastroenterology for further management if symptoms not improved.  You may return to Emergency Department if vomiting persist  Back pain is likely secondary to muscle strain.  Please follow-up with neuro urology for further management of urinary frequency  I have provided you with PCP to establish care for routine medical complaints, annual visits.

## 2024-01-27 NOTE — ED Provider Triage Note (Signed)
 Emergency Medicine Provider Triage Evaluation Note  Caitlyn Miller , a 39 y.o. female  was evaluated in triage.  Pt complains of back pain intermittently for past four weeks. Constant for past five days. Goes into buttocks and posterior thighs. No saddle paresthesia nor incontinence  Increased urination and urge. No hematuria, burning with urination.  Vomiting 2-5x a day for past five days. No diarrhea. Last BM 2 days ago  Review of Systems  Positive: See hpi Negative:   Physical Exam  BP 113/71 (BP Location: Left Arm)   Pulse 68   Temp 98.5 F (36.9 C) (Oral)   Resp 15   LMP 01/11/2024   SpO2 100%  Gen:   Awake, no distress   Resp:  Normal effort  MSK:   Moves extremities without difficulty  Other:  RLQ, LLQ abd tenderness  Medical Decision Making  Medically screening exam initiated at 4:57 PM.  Appropriate orders placed.  Dmiyah Miller was informed that the remainder of the evaluation will be completed by another provider, this initial triage assessment does not replace that evaluation, and the importance of remaining in the ED until their evaluation is complete.  Labs, CT ordered Zofran  and percocet ordered   Minnie Tinnie BRAVO, PA 01/27/24 1703

## 2024-01-30 ENCOUNTER — Other Ambulatory Visit: Payer: Self-pay

## 2024-01-30 ENCOUNTER — Inpatient Hospital Stay: Attending: Hematology | Admitting: Hematology

## 2024-01-30 ENCOUNTER — Telehealth: Payer: Self-pay

## 2024-01-30 ENCOUNTER — Inpatient Hospital Stay

## 2024-01-30 VITALS — BP 111/67 | HR 89 | Temp 98.2°F | Resp 15 | Wt 148.9 lb

## 2024-01-30 DIAGNOSIS — D649 Anemia, unspecified: Secondary | ICD-10-CM | POA: Insufficient documentation

## 2024-01-30 DIAGNOSIS — Z79810 Long term (current) use of selective estrogen receptor modulators (SERMs): Secondary | ICD-10-CM | POA: Diagnosis not present

## 2024-01-30 DIAGNOSIS — Z170 Estrogen receptor positive status [ER+]: Secondary | ICD-10-CM | POA: Insufficient documentation

## 2024-01-30 DIAGNOSIS — Z9011 Acquired absence of right breast and nipple: Secondary | ICD-10-CM | POA: Insufficient documentation

## 2024-01-30 DIAGNOSIS — Z79899 Other long term (current) drug therapy: Secondary | ICD-10-CM | POA: Diagnosis not present

## 2024-01-30 DIAGNOSIS — Z1721 Progesterone receptor positive status: Secondary | ICD-10-CM | POA: Insufficient documentation

## 2024-01-30 DIAGNOSIS — C50411 Malignant neoplasm of upper-outer quadrant of right female breast: Secondary | ICD-10-CM

## 2024-01-30 DIAGNOSIS — Z1732 Human epidermal growth factor receptor 2 negative status: Secondary | ICD-10-CM | POA: Insufficient documentation

## 2024-01-30 DIAGNOSIS — K5900 Constipation, unspecified: Secondary | ICD-10-CM | POA: Diagnosis not present

## 2024-01-30 DIAGNOSIS — F418 Other specified anxiety disorders: Secondary | ICD-10-CM | POA: Diagnosis not present

## 2024-01-30 DIAGNOSIS — Z803 Family history of malignant neoplasm of breast: Secondary | ICD-10-CM | POA: Insufficient documentation

## 2024-01-30 DIAGNOSIS — Z17411 Hormone receptor positive with human epidermal growth factor receptor 2 negative status: Secondary | ICD-10-CM | POA: Diagnosis not present

## 2024-01-30 DIAGNOSIS — Z9221 Personal history of antineoplastic chemotherapy: Secondary | ICD-10-CM | POA: Diagnosis not present

## 2024-01-30 LAB — GENETIC SCREENING ORDER

## 2024-01-30 MED ORDER — VENLAFAXINE HCL 25 MG PO TABS: 25.0000 mg | ORAL_TABLET | Freq: Two times a day (BID) | ORAL | 2 refills | Status: AC

## 2024-01-30 NOTE — Assessment & Plan Note (Signed)
 Stage IIA, p(T2N1aM0), ER+/PR+/HER2-, Grade II, Mammaprint luminal type A, low risk -presented with large palpable mass. Breast MRI and PET scan on 11/25/19 were otherwise negative.  -Mammaprint showed low risk disease. -She started neoadjuvant tamoxifen  on 12/01/19, tolerated poorly. Dose was reduced to 10mg  and she started Zoladex  from 12/09/19, and switched to anastrozole  01/06/20 - 06/2020 (held for chemo). Zoladex  discontinued 09/16/20.  -she took Verzenio  on 01/12/20 - 03/25/20 but tolerated poorly. -initially underwent lumpectomy on 04/15/21 with Dr. Belinda but was found to have a 3.1 cm tumor with positive margins and 2/3 positive lymph nodes. -s/p mastectomy on 05/18/20 showing: residual invasive carcinoma and DCIS, a positive margin, and another 2/12 positive nodes, indicating stage III disease.  -chest CT 06/17/20 and CT AP 07/02/20 were negative. -received one cycle AC on 06/24/20, discontinued due to very poor tolerance. -she completed 12 weeks Taxol  07/08/20 - 10/29/20. -s/p adjuvant RT 10/25/20 - 12/14/20 but developed severe chest pain. -post-treatment PET on 01/06/21 was NED. -she restarted tamoxifen  20mg  in 12/2020 due to poor tolerance of anastrozole /Verzenio , held for month of 04/2021 for reconstruction. She is tolerating well with controlled hot flashes on effexor . -she had breast implant placement on right and exchange on left side on 11/18/2021

## 2024-01-30 NOTE — Progress Notes (Signed)
 " [facility]   [phone] [phone]   Clinic Follow up Note   Patient Care Team: Pcp, No as PCP - General Mansouraty, Aloha Raddle., MD as Consulting Physician (Gastroenterology) Belinda Cough, MD as Consulting Physician (General Surgery) Lanny Callander, MD as Consulting Physician (Hematology) Dewey Rush, MD as Consulting Physician (Radiation Oncology) Contogiannis, Ronal Caldron, MD as Consulting Physician (Plastic Surgery) Burton, Lacie K, NP as Nurse Practitioner (Nurse Practitioner) Margarito James, MD (Plastic Surgery)  Date of Service:  01/30/2024  CHIEF COMPLAINT: body pain and anorexia   CURRENT THERAPY:  Tamoxifen  10mg  daily   Oncology History   Malignant neoplasm of upper-outer quadrant of right breast in female, estrogen receptor positive (HCC) Stage IIA, p(T2N1aM0), ER+/PR+/HER2-, Grade II, Mammaprint luminal type A, low risk -presented with large palpable mass. Breast MRI and PET scan on 11/25/19 were otherwise negative.  -Mammaprint showed low risk disease. -She started neoadjuvant tamoxifen  on 12/01/19, tolerated poorly. Dose was reduced to 10mg  and she started Zoladex  from 12/09/19, and switched to anastrozole  01/06/20 - 06/2020 (held for chemo). Zoladex  discontinued 09/16/20.  -she took Verzenio  on 01/12/20 - 03/25/20 but tolerated poorly. -initially underwent lumpectomy on 04/15/21 with Dr. Belinda but was found to have a 3.1 cm tumor with positive margins and 2/3 positive lymph nodes. -s/p mastectomy on 05/18/20 showing: residual invasive carcinoma and DCIS, a positive margin, and another 2/12 positive nodes, indicating stage III disease.  -chest CT 06/17/20 and CT AP 07/02/20 were negative. -received one cycle AC on 06/24/20, discontinued due to very poor tolerance. -she completed 12 weeks Taxol  07/08/20 - 10/29/20. -s/p adjuvant RT 10/25/20 - 12/14/20 but developed severe chest pain. -post-treatment PET on 01/06/21 was NED. -she restarted tamoxifen   20mg  in 12/2020 due to poor tolerance of anastrozole /Verzenio , held for month of 04/2021 for reconstruction. She is tolerating well with controlled hot flashes on effexor . -she had breast implant placement on right and exchange on left side on 11/18/2021  Assessment & Plan History of breast cancer Prior breast cancer treated with surgery, currently on tamoxifen . Recent CT abdomen/pelvis negative for malignancy. Dense breast tissue and prior negative Signatera. Risks of interrupting tamoxifen  (loss of anti-estrogenic protection) weighed against need to clarify if symptoms are medication-related. - Ordered PET scan to evaluate for recurrence or metastatic disease. - Ordered repeat Signatera blood test for minimal residual disease. - Advised to discontinue tamoxifen  for two weeks to assess for symptom improvement; plan to resume if no change. - Scheduled follow-up in February 2026.  Benign breast mass of left breast Known left breast mass, likely fibroadenoma, identified on ultrasound September 2025. Lesion appears benign, no palpable abnormality or tenderness. She declined mammography due to pain and prefers non-invasive imaging. Further evaluation deferred pending systemic cancer workup. - Deferred breast MRI pending PET scan results. - Advised to continue planned ultrasound follow-up in three months if PET scan is negative.  Chronic musculoskeletal and joint pain Chronic, diffuse musculoskeletal and joint pain, recently worsened. Tamoxifen  is a possible etiology; family history of autoimmune disease. Symptomatic management prioritized pending further evaluation. - Advised to discontinue tamoxifen  for two weeks to assess for improvement. - Referred to rheumatology for possible autoimmune etiology after PET scan results. - Recommended acetaminophen , warm packs, and lidocaine  patch for symptomatic relief.  Fatigue and malaise Persistent fatigue, malaise, and poor sleep, worsened over the past  month. Significant impact on daily activities. - Ordered PET scan and repeat Signatera to evaluate for malignancy. - Advised to maintain adequate hydration and  nutrition. - Encouraged to follow up with primary care for further evaluation and management.  Depression? Depression managed with venlafaxine  at reduced dose. She denies current symptoms of anxiety or depression, attributing symptoms to physical illness. No benefit from higher venlafaxine  doses previously. - Discussed increasing venlafaxine  dose; she declined due to prior lack of benefit. - Continue current venlafaxine  dose. - Requested prescription for pill formulation instead of capsule.  Constipation Ongoing constipation. Managed with medication; hydration encouraged. - Continue current constipation regimen. - Encouraged to maintain adequate hydration.   Anorexia and dehydration Mild dehydration on recent ED labs, with poor oral intake due to nausea and malaise. Reports dizziness and near-syncope. Risk of worsening fatigue and syncope if dehydration persists. - Advised to increase oral fluid intake. - Nurse to check orthostatic blood pressure; if hypotensive, consider IV fluids. - Educated on importance of hydration to prevent complications.   Plan - Patient has multiple constitutional symptoms, etiology is unknown.  I reviewed her recent lab and CT abdomen pelvis which were done during her ED visit - Will obtain PET scan, and repeat Signatera, to rule out cancer recurrence -- I refilled her Effexor , not want to increase her dose. - Will call her with the above test results.  Follow-up in February as scheduled, or sooner if test results are abnormal. - I strongly encouraged her to find a primary care physician, she will call for appointment.  - She will continue tamoxifen  10 mg daily    SUMMARY OF ONCOLOGIC HISTORY: Oncology History Overview Note  Cancer Staging Malignant neoplasm of upper-outer quadrant of right  breast in female, estrogen receptor positive (HCC) Staging form: Breast, AJCC 8th Edition - Clinical stage from 11/12/2019: Stage IIA (cT2, cN1, cM0, G2, ER+, PR+, HER2-) - Signed by Lanny Callander, MD on 11/19/2019 Stage prefix: Initial diagnosis Histologic grading system: 3 grade system - Pathologic stage from 04/15/2020: No Stage Recommended (ypT2, pN2a, cM0, G2, ER+, PR+, HER2-) - Signed by Lanny Callander, MD on 05/27/2020 Stage prefix: Post-therapy Histologic grading system: 3 grade system Residual tumor (R): R1 - Microscopic    Malignant neoplasm of upper-outer quadrant of right breast in female, estrogen receptor positive (HCC)  11/05/2019 Mammogram   IMPRESSION: Large irregular palpable mass 4.7 x 1.4 x 1.4 cm within the upper-outer right breast 11 o'clock position, 4 cm from nipple.  There is a large area of associated coarse heterogeneous calcifications within the mass. Overall findings are concerning for breast carcinoma.   Two cortically thickened right axillary lymph nodes which are indeterminate in etiology.   11/12/2019 Cancer Staging   Staging form: Breast, AJCC 8th Edition - Clinical stage from 11/12/2019: Stage IIA (cT2, cN1, cM0, G2, ER+, PR+, HER2-) - Signed by Lanny Callander, MD on 11/19/2019   11/12/2019 Initial Biopsy   Diagnosis 1. Breast, right, needle core biopsy, right - INVASIVE MAMMARY CARCINOMA, SEE COMMENT. - MAMMARY CARCINOMA IN SITU. 2. Lymph node, needle/core biopsy, right - METASTATIC MAMMARY CARCINOMA. Microscopic Comment 1. The carcinoma appears grade 2 and measures 16 mm in greatest linear extent. E-cadherin will be ordered. Prognostic makers will be ordered. Dr. Belvie has reviewed the case. The case was called to [facility] [place] on 01/13/2020.    1. E-cadherin is strongly positive consistent with a ductal phenotype.   11/12/2019 Receptors her2   1. PROGNOSTIC INDICATORS Results: IMMUNOHISTOCHEMICAL AND MORPHOMETRIC ANALYSIS PERFORMED  MANUALLY The tumor cells are NEGATIVE for Her2 (0). Estrogen Receptor: 100%, POSITIVE, STRONG STAINING INTENSITY Progesterone Receptor: 100%, POSITIVE,  STRONG STAINING INTENSITY Proliferation Marker Ki67: 5%   11/17/2019 Initial Diagnosis   Malignant neoplasm of upper-outer quadrant of right breast in female, estrogen receptor positive (HCC)   11/25/2019 Breast MRI   IMPRESSION: 1. Large area of non-mass enhancement involving the UPPER OUTER QUADRANT of the RIGHT breast measuring approximately 4.2 x 4.1 x 2.4 cm. The biopsy-proven IDC and DCIS is present at the superomedial aspect of this NME. 2. No MRI evidence of malignancy involving the LEFT breast. 3. Intact BILATERAL retropectoral implants. 4. 2 pathologically enlarged RIGHT axillary lymph nodes. One of these nodes is biopsy-proven metastatic disease. No pathologic lymphadenopathy elsewhere   11/25/2019 PET scan   IMPRESSION: 1. Two mildly enlarged hypermetabolic right axillary lymph nodes compatible with right axillary nodal metastases. 2. No additional sites of hypermetabolic metastatic disease. 3. Asymmetric indistinct upper right breast hypermetabolism, without discrete mass correlate on the CT images, compatible with known primary right breast malignancy.     11/26/2019 Genetic Testing   Negative genetic testing: no pathogenic variants detected in [facility] STAT Breast Cancer Panel.  Variant of uncertain significance (VUS) detected in CHEK2 at c.1556G>T (p.Arg519Leu).  The report date is November 26, 2019.   The STAT Breast cancer panel offered by [facility] includes sequencing and rearrangement analysis for the following 9 genes:  ATM, BRCA1, BRCA2, CDH1, CHEK2, PALB2, PTEN, STK11 and TP53.    Results of [facility] Multi-Cancer Panel are pending.    12/01/2019 -  Anti-estrogen oral therapy   Neoadjuvant Tamoxifen  20mg  on 12/01/19. Reduced to 10mg  on 12/09/19.       ---Zoladex  injection monthly starting 12/09/19.        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  Variants of uncertain significance detected RAD51D at c.715C>T (p.Arg239Trp) and CHEK2 at c.1556G>T (p.Arg519Leu).  The report date is December 08, 2019.   The Multi-Cancer Panel offered by [facility] includes sequencing and/or deletion duplication testing of the following 85 genes: AIP, ALK, APC, ATM, AXIN2,BAP1,  BARD1, BLM, BMPR1A, BRCA1, BRCA2, BRIP1, CASR, CDC73, CDH1, CDK4, CDKN1B, CDKN1C, CDKN2A (p14ARF), CDKN2A (p16INK4a), CEBPA, CHEK2, CTNNA1, DICER1, DIS3L2, EGFR (c.2369C>T, p.Thr790Met variant only), EPCAM (Deletion/duplication testing only), FH, FLCN, GATA2, GPC3, GREM1 (Promoter region deletion/duplication testing only), HOXB13 (c.251G>A, p.Gly84Glu), HRAS, KIT, MAX, MEN1, MET, MITF (c.952G>A, p.Glu318Lys variant only), MLH1, MSH2, MSH3, MSH6, MUTYH, NBN, NF1, NF2, NTHL1, PALB2, PDGFRA, PHOX2B, PMS2, POLD1, POLE, POT1, PRKAR1A, PTCH1, PTEN, RAD50, RAD51C, RAD51D, RB1, RECQL4, RET, RNF43, RUNX1, SDHAF2, SDHA (sequence changes only), SDHB, SDHC, SDHD, SMAD4, SMARCA4, SMARCB1, SMARCE1, STK11, SUFU, TERC, TERT, TMEM127, TP53, TSC1, TSC2, VHL, WRN and WT1.    12/09/2019 Miscellaneous   Mammaprint luminal type A, low risk  10% risk of recurrence in 10 years with 97.8% benefor of hormaonal therapy alone.    01/12/2020 - 03/25/2020 Chemotherapy   Verzenio  started on 01/12/2020, dose reduced to 50mg  am and 100mg  01/27/2020 due to poor tolerance. Held Verzenio  on 03/25/20 to proceed with breast surgery.    03/01/2020 Mammogram   Targeted ultrasound is performed, showing interval decreasing conspicuity of the patient's known right breast cancer. A post biopsy clip with an associated irregular area shadowing is demonstrated at the 11 o'clock position 4 cm from the nipple. Exact measurements are difficult  due to the vague appearance of the findings. It measures approximately 1.2 x 0.9 x 0.6 cm (previously 4.7 x 2.2 x 1.5 cm).   IMPRESSION:  Imaging findings consistent with good response to chemotherapy.   03/16/2020 Imaging   MRI Breast  IMPRESSION: 1. Interval resolution of RIGHT axillary adenopathy. 2. Slightly smaller area of non mass enhancement in the UPPER-OUTER QUADRANT of the RIGHT breast.   04/15/2020 Surgery   RIGHT BREAST LUMPECTOMY WITH RADIOACTIVE SEED LOCALIZATION and RADIOACTIVE SEED GUIDED RIGHT AXILLARY SENTINEL LYMPH NODE DISSECTION and SENTINEL NODE BIOPSY by Dr Belinda    04/15/2020 Pathology Results   FINAL MICROSCOPIC DIAGNOSIS:   A. 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  - Invasive carcinoma broadly involves each the anterior, posterior,  superior and inferior margins.  - DCIS involves the inferior margin and is < 1 mm from each the anterior  and superior margins.  - Atypical lobular hyperplasia.  - Biopsy sites.  - See oncology table.   B. TARGETED LYMPH NODE, RIGHT AXILLA, BIOPSY:  - Metastatic carcinoma in (1) of (1) lymph node.  - Biopsy site.   C. SENTINEL LYMPH NODE, RIGHT AXILLA #1, BIOPSY:  - Metastatic carcinoma in (1) of (1) lymph node.   D. SENTINEL LYMPH NODE, RIGHT AXILLA #2, BIOPSY:  - One lymph node, negative for carcinoma (0/1).    04/15/2020 Cancer Staging   Staging form: Breast, AJCC 8th Edition - Pathologic stage from 04/15/2020: No Stage Recommended (ypT2, pN2a, cM0, G2, ER+, PR+, HER2-) - Signed by Lanny Callander, MD on 05/27/2020 Stage prefix: Post-therapy Histologic grading system: 3 grade system Residual tumor (R): R1 - Microscopic   05/18/2020 Surgery   Right Mastectomy  A. BREAST, RIGHT, MASTECTOMY:  - Residual invasive carcinoma with mixed ductal and  lobular features.       Residual invasive carcinoma broadly involves the anterior inferior  soft tissue margin.       Residual invasive carcinoma is focally < 1 mm from the deep margin.  - Residual ductal carcinoma in situ.       Residual DCIS is 3 mm from the closest margin, deep.  - Residual atypical lobular hyperplasia.  - Prior procedure site changes.  - Biopsy clip (X2).   B. BREAST IMPLANT, RIGHT, EXPLANTATION:  - Implant (gross only).   C. LYMPH NODES, RIGHT AXILLA, DISSECTION:  - Metastatic carcinoma in (2) of (12) lymph nodes.  - Prior procedure site changes in surrounding soft tissue.    06/24/2020 - 09/30/2020 Chemotherapy   Patient is on Treatment Plan : BREAST ADJUVANT DOSE DENSE AC q14d / PACLitaxel  q7d     10/2022 Mammogram   3D unilateral diagnostic mammogram of left breast  IMPRESSION: 1. No mammographic or sonographic abnormalities within the LEFT breast in the area of patient's pain. 2. No mammographic evidence of LEFT breast malignancy.   RECOMMENDATION: Recommend clinical follow-up as indicated. Any further workup should be based on clinical grounds.   Screening mammogram in 1 year. BI-RADS CATEGORY  1: Negative.        Discussed the use of AI scribe software for clinical note transcription with the patient, who gave verbal consent to proceed.  History of Present Illness Caitlyn Miller is a 39 year old female with ER+ breast cancer, status post right breast surgery and currently on tamoxifen , who presents with worsening diffuse pain and fatigue.  Over the past week she has had worsening low back pain radiating to both legs, with additional constant pain in the  neck, shoulders, knees, elbows, and legs, now more severe than her prior intermittent discomfort. The pain disrupts sleep, with difficulty falling and staying asleep, and she wakes fatigued and unable to perform usual activities.  She has profound fatigue, generalized weakness, and marked loss of  exercise tolerance and ability to complete routine tasks. Associated symptoms include nausea, vomiting, dizziness that is worse on standing, and poor oral intake. She and her family note pallor and that she looks unwell.  She was recently evaluated in the emergency department. CT abdomen and pelvis showed no acute findings. Labs showed mild anemia and mild dehydration. COVID-19, influenza, pregnancy testing, and urinalysis were negative. She did not receive IV fluids despite feeling faint and requesting them. She continues to have poor oral intake with persistent dizziness and weakness.  She takes tamoxifen  10 mg daily, reduced from a higher dose 2 weeks ago because of gastrointestinal discomfort, and is reluctant to stop it. She has a known left breast mass with ultrasound suggesting fibroadenoma and planned 99-month follow-up. She declined mammography because of pain and fear and has not had breast MRI. Signatera testing in April 2025 was negative. She notes a longstanding sensation under the left nipple.  She has constipation managed with medication and marked nocturia, voiding more than 10 times nightly, with a urology visit planned for suspected overactive bladder. She denies cough, throat pain, fever, chills, or weight loss. Menses are regular. She is concerned about autoimmune disease given family history. She takes venlafaxine , prefers a formulation change rather than dose increase, and previously did not tolerate ovarian suppression injections.     All other systems were reviewed with the patient and are negative.  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   Pelvic pain 09/2008   Personal history of chemotherapy    Personal history of radiation therapy     SURGICAL HISTORY: Past Surgical History:  Procedure Laterality Date   AUGMENTATION MAMMAPLASTY Bilateral 2020   silicone    BREAST IMPLANT REMOVAL Right 05/18/2020   Procedure: REMOVAL OF RIGHT BREAST IMPLANT;  Surgeon: Belinda Cough, MD;  Location: [facility] OR;  Service: General;  Laterality: Right;   BREAST LUMPECTOMY WITH RADIOACTIVE SEED LOCALIZATION Right 04/15/2020   Procedure: RIGHT BREAST LUMPECTOMY WITH RADIOACTIVE SEED LOCALIZATION;  Surgeon: Belinda Cough, MD;  Location: [facility];  Service: General;  Laterality: Right;   MASTECTOMY     MODIFIED MASTECTOMY Right 05/18/2020   Procedure: RIGHT MODIFIED RADICAL MASTECTOMY;  Surgeon: Belinda Cough, MD;  Location[facility] OR;  Service: General;  Laterality: Right;   PORTACATH PLACEMENT Left 06/10/2020   Procedure: INSERTION PORT-A-CATH;  Surgeon: Belinda Cough, MD;  Location: [facility];  Service: General;  Laterality: Left;  45 MINUTES ROOM 2   RADIOACTIVE SEED GUIDED AXILLARY  SENTINEL LYMPH NODE Right 04/15/2020   Procedure: RADIOACTIVE SEED GUIDED RIGHT AXILLARY SENTINEL LYMPH NODE DISSECTION;  Surgeon: Belinda Cough, MD;  Location: [facility];  Service: General;  Laterality: Right;   SENTINEL NODE BIOPSY N/A 04/15/2020   Procedure: SENTINEL NODE BIOPSY;  Surgeon: Belinda Cough, MD;  Location: [facility];  Service: General;  Laterality: N/A;   WISDOM TOOTH EXTRACTION      I have reviewed the social history and family history with the patient and they are unchanged from previous note.  ALLERGIES:  is allergic to ibuprofen.  MEDICATIONS:   Current Outpatient Medications  Medication Sig Dispense Refill   acetaminophen  (TYLENOL  8 HOUR) 650 MG CR tablet Take 1 tablet (650 mg total) by mouth every 8 (eight) hours as needed for up to 10 days for pain. 30 tablet 0   hydrocortisone  1 % lotion Apply 1 application topically daily as needed (rash). 118 mL 0   lidocaine  (LIDODERM ) 5 % Place 1 patch onto the skin daily. Remove & Discard patch within 12 hours or as directed by MD 30 patch 0   ondansetron  (ZOFRAN  ODT) 8 MG disintegrating tablet Take 1 tablet (8 mg total) by mouth every 8 (eight) hours as needed for nausea or vomiting. 30 tablet 0   ondansetron  (ZOFRAN -ODT) 4 MG disintegrating tablet Take 1 tablet (4 mg total) by mouth every 8 (eight) hours as needed for nausea or vomiting. 20 tablet 0   pantoprazole  (PROTONIX ) 40 MG tablet TAKE 1 TABLET(40 MG) BY MOUTH DAILY 30 tablet 3   tamoxifen  (NOLVADEX ) 20 MG tablet Take 1 tablet (20 mg total) by mouth daily. 90 tablet 3   venlafaxine  XR (EFFEXOR -XR) 37.5 MG 24 hr capsule TAKE 1 CAPSULE(37.5 MG) BY MOUTH DAILY WITH BREAKFAST 30 capsule 3   venlafaxine  (EFFEXOR ) 25 MG tablet Take 1 tablet (25 mg total) by mouth 2 (two) times daily. 60 tablet 2   No current facility-administered medications for this visit.    PHYSICAL EXAMINATION: ECOG PERFORMANCE STATUS: 2 - Symptomatic, <50% confined to bed  Vitals:   01/30/24 1300  BP: 111/67  Pulse: 89  Resp: 15  Temp: 98.2 F (36.8 C)  SpO2: 99%   Wt Readings from Last 3 Encounters:  01/30/24 148 lb 14.4 oz (67.5 kg)  09/12/23 150 lb (68 kg)  03/14/23 146 lb 1.6 oz (66.3 kg)     GENERAL:alert, no distress and comfortable SKIN: skin color, texture, turgor are normal, no rashes or significant lesions EYES: normal, Conjunctiva are pink and non-injected, sclera clear NECK: supple, thyroid  normal size, non-tender, without nodularity LYMPH:  no palpable lymphadenopathy in the cervical, axillary  LUNGS: clear to auscultation and percussion  with normal breathing effort HEART: regular rate & rhythm and no murmurs and no lower extremity edema ABDOMEN:abdomen soft, non-tender and normal bowel sounds Musculoskeletal:no cyanosis of digits and no clubbing. (+) mild tenderness in the upper and lower back. NEURO: alert & oriented x 3 with fluent speech, no focal motor/sensory deficits  Physical Exam   LABORATORY DATA:  I have reviewed the data as listed    Latest Ref Rng & Units 01/27/2024    9:12 PM 01/27/2024    5:11 PM 09/12/2023    1:02 PM  CBC  WBC 4.0 - 10.5 K/uL  8.0  6.9   Hemoglobin 12.0 - 15.0 g/dL 88.3  87.1  87.8   Hematocrit 36.0 - 46.0 % 34.0  38.5  36.6   Platelets 150 - 400 K/uL  254  224         Latest Ref Rng & Units 01/27/2024    9:12 PM 09/12/2023    1:02 PM 03/14/2023   10:26 AM  CMP  Glucose 70 - 99 mg/dL 86  85  88   BUN 6 - 20 mg/dL 10  10  10    Creatinine 0.44 - 1.00 mg/dL 9.49  9.39  9.40   Sodium 135 - 145 mmol/L 139  139  141   Potassium 3.5 - 5.1 mmol/L 3.8  4.3  4.0   Chloride 98 - 111 mmol/L 106  106  108   CO2 22 - 32 mmol/L  27  25   Calcium 8.9 - 10.3 mg/dL  9.3  9.4   Total Protein 6.5 - 8.1 g/dL  7.0  7.0   Total Bilirubin 0.0 - 1.2 mg/dL  0.5  0.5   Alkaline Phos 38 - 126 U/L  47  58   AST 15 - 41 U/L  37  50   ALT 0 - 44 U/L  52  107       RADIOGRAPHIC STUDIES: I have personally reviewed the radiological images as listed and agreed with the findings in the report. No results found.    Orders Placed This Encounter  Procedures   NM PET Image Restag (PS) Skull Base To Thigh    Standing Status:   Future    Expected Date:   02/13/2024    Expiration Date:   01/29/2025    If indicated for the ordered procedure, I authorize the administration of a radiopharmaceutical per Radiology protocol:   Yes    Is the patient pregnant?:   No    Preferred imaging location?:   [facility]   All questions were answered. The patient knows to call the clinic with any problems, questions or  concerns. No barriers to learning was detected. The total time spent in the appointment was 30 minutes, including review of chart and various tests results, discussions about plan of care and coordination of care plan     Onita Mattock, MD 01/30/2024     "

## 2024-01-30 NOTE — Telephone Encounter (Signed)
 Received telephone call from the patient's husband reporting patient not feeling well and vomiting, stated patient was seen in ED-01/27/24. Spouse denies any breast pain, drainage or chest pains.  Let husband know patient needs to f/u with her PCP. Spouse stated patient does not have a PCP. Recommended Urgent Care considering patient's symptoms are not cancer related. Spouse requested a message be sent to Dr. Lanny to further advise.

## 2024-02-06 LAB — SIGNATERA
SIGNATERA MTM READOUT: 0 MTM/ml
SIGNATERA TEST RESULT: NEGATIVE

## 2024-02-08 ENCOUNTER — Other Ambulatory Visit: Payer: Self-pay

## 2024-02-14 ENCOUNTER — Encounter (HOSPITAL_COMMUNITY)
Admission: RE | Admit: 2024-02-14 | Discharge: 2024-02-14 | Disposition: A | Discharge status: Home / Self Care | Source: Ambulatory Visit | Attending: Hematology | Admitting: Hematology

## 2024-02-14 DIAGNOSIS — Z170 Estrogen receptor positive status [ER+]: Secondary | ICD-10-CM | POA: Insufficient documentation

## 2024-02-14 DIAGNOSIS — C50411 Malignant neoplasm of upper-outer quadrant of right female breast: Secondary | ICD-10-CM | POA: Insufficient documentation

## 2024-02-14 LAB — GLUCOSE, CAPILLARY: Glucose-Capillary: 89 mg/dL (ref 70–99)

## 2024-02-14 MED ORDER — FLUDEOXYGLUCOSE F - 18 (FDG) INJECTION
7.3800 | Freq: Once | INTRAVENOUS | Status: AC
  Administered 2024-02-14: 7.38 via INTRAVENOUS

## 2024-02-15 ENCOUNTER — Encounter: Payer: Self-pay | Admitting: Obstetrics and Gynecology

## 2024-02-15 ENCOUNTER — Ambulatory Visit: Admitting: Obstetrics and Gynecology

## 2024-02-15 VITALS — BP 110/77 | HR 78 | Ht 62.7 in | Wt 142.0 lb

## 2024-02-15 DIAGNOSIS — N941 Unspecified dyspareunia: Secondary | ICD-10-CM | POA: Insufficient documentation

## 2024-02-15 DIAGNOSIS — M62838 Other muscle spasm: Secondary | ICD-10-CM | POA: Insufficient documentation

## 2024-02-15 DIAGNOSIS — R350 Frequency of micturition: Secondary | ICD-10-CM | POA: Insufficient documentation

## 2024-02-15 LAB — POCT URINALYSIS DIP (CLINITEK)
Bilirubin, UA: NEGATIVE
Blood, UA: NEGATIVE
Glucose, UA: NEGATIVE mg/dL
Ketones, POC UA: NEGATIVE mg/dL
Nitrite, UA: NEGATIVE
POC PROTEIN,UA: NEGATIVE
Spec Grav, UA: >=1.03 — AB
Urobilinogen, UA: 0.2 U/dL
pH, UA: 5.5

## 2024-02-15 MED ORDER — DIAZEPAM 5 MG PO TABS: ORAL_TABLET | ORAL | 1 refills | Status: AC

## 2024-02-15 NOTE — Assessment & Plan Note (Addendum)
-   Likely due to pelvic floor muscle spasm. Will benefit from pelvic PT.  - Also recommended silicone based lubricant

## 2024-02-15 NOTE — Assessment & Plan Note (Addendum)
-   The origin of pelvic floor muscle spasm can be multifactorial, including primary, reactive to a different pain source, trauma, or even part of a centralized pain syndrome.Treatment options include pelvic floor physical therapy, local (vaginal) or oral  muscle relaxants, pelvic muscle trigger point injections or centrally acting pain medications.   - Will prescribe vaginal valium  5mg  to use as needed for pelvic pain/ discomfort - referral also placed to pelvic PT

## 2024-02-15 NOTE — Patient Instructions (Signed)
 Keep bladder diary for 3 days with measurements of urine and bring to next visit I will prescribe valium  5 mg pills to place vaginally up to 2 times a day for vaginal muscle spasms. Start at night and take one every night for the next several weeks to see if it improves your symptoms. Once you are improving you can taper off the medication and just use as needed. If the medication makes you drowsy then only use at bedtime and/or we can reduce the dose. Do not use gel to place it, as that will prevent the tablet from dissolving.  Instead place 1-2 drops of water on the table before inserting it in the vagina. If the pills do not dissolve well we can switch to a special compounded suppository. Let me know how you are doing on the medication and if you have any questions.

## 2024-02-15 NOTE — Progress Notes (Signed)
 " New Patient Evaluation and Consultation  Referring Provider: Henry Slough, MD PCP: Freddrick, No Date of Service: 02/15/2024  SUBJECTIVE Chief Complaint: New Patient (Initial Visit) Caitlyn Miller is a 39 y.o. female is here for Frequency of micturition.)  History of Present Illness: Caitlyn Miller is a 39 y.o.  female seen in consultation at the request of Dr Slough Henry for evaluation of urinary frequency.    Review of records significant for: Urodynamic testing done Sept 2025:  Bladder capacity Voided , no PVR noted.  Pdet q Max 85cm, no valsalva void, normal flow pattern  Urinary Symptoms: Does not leak urine.   Day time voids- 4 times within the first 2 hours of waking then can hold a few hours at a time.  Nocturia: 10 times per night to void. She wakes up because she has to urinate. Usually only small amounts of urine.  Reports she has not slept well in a year.  Voiding dysfunction:  does not empty bladder well.  Patient does not use a catheter to empty bladder.  When urinating, patient feels the need to urinate multiple times in a row Drinks: 3 bottles water per day, stops drinking around 7pm Tried tolerodine and one other medication and did not help Does not snore, has never had a sleep study  UTIs: 0 UTI's in the last year.   Denies history of blood in urine and kidney or bladder stones   Pelvic Organ Prolapse Symptoms:                  Patient Denies a feeling of a bulge the vaginal area.  Was told that her bladder was low when she saw a Dr in [place]  Bowel Symptom: Bowel movements: 1 time(s) per day Stool consistency: hard or soft  Straining: no.  Splinting: no.  Incomplete evacuation: no.  Patient Denies accidental bowel leakage / fecal incontinence Bowel regimen: none   Sexual Function Sexually active: yes.  Sexual orientation: Straight Pain with sex: Yes, with insertion and deep in the pelvis She uses a lubricant- unsure of the brand.  Has  not done pelvic phys  Pelvic Pain Admits to pelvic pain Pain occurs: after sex   Past Medical History:  Past Medical History:  Diagnosis Date   Abnormal Pap smear    Acid reflux    Anemia    Anxiety    Breast cancer (HCC)    right   Decreased appetite 10/2006   Depression    Endometriosis 10/2004   Epigastric pain 10/2006   Family history of breast cancer 11/19/2019   FH: migraines    GBS carrier    H/O amenorrhea 06/2006   H/O dyspareunia 07/2005   H/O fatigue    H/O nausea and vomiting 10/2006   H/O rubella    H/O varicella    Hyperemesis arising during pregnancy    First pregnancy   Irregular periods/menstrual cycles 02/2005   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   Pelvic pain 09/2008   Personal history of chemotherapy    Personal history of radiation therapy      Past Surgical History:   Past Surgical History:  Procedure Laterality Date   AUGMENTATION MAMMAPLASTY Bilateral 2020   silicone    BREAST IMPLANT REMOVAL Right 05/18/2020   Procedure: REMOVAL OF RIGHT BREAST IMPLANT;  Surgeon: Belinda Cough, MD;  Location: [facility];  Service: General;  Laterality: Right;   BREAST LUMPECTOMY WITH RADIOACTIVE SEED LOCALIZATION Right 04/15/2020   Procedure:  RIGHT BREAST LUMPECTOMY WITH RADIOACTIVE SEED LOCALIZATION;  Surgeon: Belinda Cough, MD;  Location: [facility];  Service: General;  Laterality: Right;   MASTECTOMY     MODIFIED MASTECTOMY Right 05/18/2020   Procedure: RIGHT MODIFIED RADICAL MASTECTOMY;  Surgeon: Belinda Cough, MD;  Location: [facility];  Service: General;  Laterality: Right;   PORTACATH PLACEMENT Left 06/10/2020   Procedure: INSERTION PORT-A-CATH;  Surgeon: Belinda Cough, MD;  Location: [facility];  Service: General;  Laterality: Left;  45 MINUTES ROOM 2   RADIOACTIVE SEED GUIDED AXILLARY SENTINEL LYMPH NODE Right 04/15/2020   Procedure: RADIOACTIVE SEED GUIDED RIGHT AXILLARY SENTINEL LYMPH NODE DISSECTION;   Surgeon: Belinda Cough, MD;  Location: [facility];  Service: General;  Laterality: Right;   SENTINEL NODE BIOPSY N/A 04/15/2020   Procedure: SENTINEL NODE BIOPSY;  Surgeon: Belinda Cough, MD;  Location: [facility];  Service: General;  Laterality: N/A;   WISDOM TOOTH EXTRACTION       Past OB/GYN History: OB History  Gravida Para Term Preterm AB Living  2 2 2   2   SAB IAB Ectopic Multiple Live Births      2    # Outcome Date GA Lbr Len/2nd Weight Sex Type Anes PTL Lv  2 Term 06/13/07 [redacted]w[redacted]d  7 lb 7 oz (3.374 kg) F Vag-Spont EPI  LIV  1 Term 10/23/04 [redacted]w[redacted]d 24:00 7 lb 13 oz (3.544 kg) M Vag-Spont EPI  LIV   Did not have any significant tearing with deliveries Patient's last menstrual period was 02/07/2024 (approximate). Contraception: condoms. Last pap: 2023- neg Any history of abnormal pap smears: no.  Medications: Patient has a current medication list which includes the following prescription(s): diazepam , hydrocortisone , lidocaine , ondansetron , ondansetron , pantoprazole , tamoxifen , and venlafaxine .   Allergies: Patient is allergic to ibuprofen.   Social History: Social History[1]  Relationship status: married Patient lives with husband and 2 kids.   Patient is not employed. Regular exercise: Yes:   History of abuse: No  Family History:   Family History  Problem Relation Age of Onset   Migraines Mother    Hypertension Father    Breast cancer Paternal Aunt 110   Colon cancer Neg Hx    Stomach cancer Neg Hx      Review of Systems: Review of Systems  Constitutional:  Positive for malaise/fatigue. Negative for fever and weight loss.  Respiratory:  Positive for shortness of breath. Negative for cough and wheezing.   Cardiovascular:  Positive for chest pain. Negative for palpitations and leg swelling.  Gastrointestinal:  Positive for abdominal pain. Negative for blood in stool.  Genitourinary:  Negative for dysuria.  Musculoskeletal:  Negative  for myalgias.  Skin:  Negative for rash.  Neurological:  Negative for dizziness and headaches.  Endo/Heme/Allergies:  Does not bruise/bleed easily.       + hot flashes  Psychiatric/Behavioral:  Negative for depression. The patient is not nervous/anxious.      OBJECTIVE Physical Exam: Vitals:   02/15/24 0932  BP: 110/77  Pulse: 78  Weight: 142 lb (64.4 kg)  Height: 5' 2.7 (1.593 m)    Physical Exam Vitals reviewed. Exam conducted with a chaperone present.  Constitutional:      General: She is not in acute distress. Pulmonary:     Effort: Pulmonary effort is normal.  Abdominal:     General: There is no distension.     Palpations: Abdomen is soft.     Tenderness: There is no abdominal  tenderness. There is no rebound.  Musculoskeletal:        General: No swelling. Normal range of motion.  Skin:    General: Skin is warm and dry.     Findings: No rash.  Neurological:     Mental Status: She is alert and oriented to person, place, and time.  Psychiatric:        Mood and Affect: Mood normal.        Behavior: Behavior normal.      GU / Detailed Urogynecologic Evaluation:  Pelvic Exam: Normal external female genitalia; Bartholin's and Skene's glands normal in appearance; urethral meatus normal in appearance, no urethral masses or discharge.   CST: negative  Speculum exam reveals normal vaginal mucosa without atrophy. Cervix normal appearance. Uterus normal single, nontender. Adnexa no mass, fullness, tenderness.     Pelvic floor strength II/V  Pelvic floor musculature: Right levator tender, Right obturator tender, Left levator tender, Left obturator tender  POP-Q:   POP-Q  -3                                            Aa   -3                                           Ba  -7                                              C   4                                            Gh  5                                            Pb  9                                             tvl   -3                                            Ap  -3                                            Bp  -9                                              D      Rectal Exam:  deferred  Post-Void Residual (PVR) by Bladder Scan: In order to evaluate bladder emptying, we discussed obtaining a postvoid residual and patient agreed to this procedure.  Procedure: The ultrasound unit was placed on the patient's abdomen in the suprapubic region after the patient had voided.    Post Void Residual - 02/15/24 0943       Post Void Residual   Post Void Residual 42 mL           Laboratory Results: Lab Results  Component Value Date   COLORU yellow 02/15/2024   CLARITYU clear 02/15/2024   GLUCOSEUR negative 02/15/2024   BILIRUBINUR negative 02/15/2024   SPECGRAV >=1.030 (A) 02/15/2024   RBCUR negative 02/15/2024   PHUR 5.5 02/15/2024   PROTEINUR NEGATIVE 01/27/2024   UROBILINOGEN 0.2 02/15/2024   LEUKOCYTESUR Trace (A) 02/15/2024    Lab Results  Component Value Date   CREATININE 0.50 01/27/2024   CREATININE 0.60 09/12/2023   CREATININE 0.59 03/14/2023    No results found for: HGBA1C  Lab Results  Component Value Date   HGB 11.6 (L) 01/27/2024     ASSESSMENT AND PLAN Caitlyn Miller is a 39 y.o. with:  1. Levator spasm   2. Dyspareunia, female   3. Urinary frequency     Levator spasm Assessment & Plan: - The origin of pelvic floor muscle spasm can be multifactorial, including primary, reactive to a different pain source, trauma, or even part of a centralized pain syndrome.Treatment options include pelvic floor physical therapy, local (vaginal) or oral  muscle relaxants, pelvic muscle trigger point injections or centrally acting pain medications.   - Will prescribe vaginal valium  5mg  to use as needed for pelvic pain/ discomfort - referral also placed to pelvic PT  Orders: -     diazePAM ; Place 1 tablet vaginally nightly as needed for muscle spasm/ pelvic  pain.  Dispense: 30 tablet; Refill: 1  Dyspareunia, female Assessment & Plan: - Likely due to pelvic floor muscle spasm. Will benefit from pelvic PT.  - Also recommended silicone based lubricant  Orders: -     AMB referral to rehabilitation  Urinary frequency Assessment & Plan: - Will have her complete a 3 day bladder diary with volumes to assess for nocturnal polyuria.  - Failed 2 OAB medications - Suspect nighttime urination may be exacerbated by pelvic floor muscle tension  Orders: -     POCT URINALYSIS DIP (CLINITEK)  Return 1 month   Rosaline LOISE Caper, MD        [1]  Social History Tobacco Use   Smoking status: Never   Smokeless tobacco: Never  Vaping Use   Vaping status: Never Used  Substance Use Topics   Alcohol use: No   Drug use: No   "

## 2024-02-15 NOTE — Assessment & Plan Note (Signed)
-   Will have her complete a 3 day bladder diary with volumes to assess for nocturnal polyuria.  - Failed 2 OAB medications - Suspect nighttime urination may be exacerbated by pelvic floor muscle tension

## 2024-02-18 NOTE — Assessment & Plan Note (Signed)
 Stage IIA, p(T2N1aM0), ER+/PR+/HER2-, Grade II, Mammaprint luminal type A, low risk -presented with large palpable mass. Breast MRI and PET scan on 11/25/19 were otherwise negative.  -Mammaprint showed low risk disease. -She started neoadjuvant tamoxifen  on 12/01/19, tolerated poorly. Dose was reduced to 10mg  and she started Zoladex  from 12/09/19, and switched to anastrozole  01/06/20 - 06/2020 (held for chemo). Zoladex  discontinued 09/16/20.  -she took Verzenio  on 01/12/20 - 03/25/20 but tolerated poorly. -initially underwent lumpectomy on 04/15/21 with Dr. Belinda but was found to have a 3.1 cm tumor with positive margins and 2/3 positive lymph nodes. -s/p mastectomy on 05/18/20 showing: residual invasive carcinoma and DCIS, a positive margin, and another 2/12 positive nodes, indicating stage III disease.  -chest CT 06/17/20 and CT AP 07/02/20 were negative. -received one cycle AC on 06/24/20, discontinued due to very poor tolerance. -she completed 12 weeks Taxol  07/08/20 - 10/29/20. -s/p adjuvant RT 10/25/20 - 12/14/20 but developed severe chest pain. -post-treatment PET on 01/06/21 was NED. -she restarted tamoxifen  20mg  in 12/2020 due to poor tolerance of anastrozole /Verzenio , held for month of 04/2021 for reconstruction. She is tolerating well with controlled hot flashes on effexor . -she had breast implant placement on right and exchange on left side on 11/18/2021

## 2024-02-19 ENCOUNTER — Inpatient Hospital Stay: Admitting: Hematology

## 2024-02-19 VITALS — BP 110/65 | HR 98 | Temp 98.8°F | Resp 16 | Ht 62.7 in | Wt 151.7 lb

## 2024-02-19 DIAGNOSIS — C50411 Malignant neoplasm of upper-outer quadrant of right female breast: Secondary | ICD-10-CM

## 2024-02-19 DIAGNOSIS — Z170 Estrogen receptor positive status [ER+]: Secondary | ICD-10-CM | POA: Diagnosis not present

## 2024-03-12 ENCOUNTER — Ambulatory Visit: Admitting: Obstetrics and Gynecology

## 2024-03-13 ENCOUNTER — Other Ambulatory Visit

## 2024-03-13 ENCOUNTER — Ambulatory Visit: Admitting: Nurse Practitioner

## 2024-03-27 ENCOUNTER — Other Ambulatory Visit

## 2024-09-01 ENCOUNTER — Inpatient Hospital Stay: Admitting: Hematology

## 2024-09-01 ENCOUNTER — Inpatient Hospital Stay
